# Patient Record
Sex: Female | Born: 1940
Health system: Southern US, Community
[De-identification: ages and names within clinical notes are randomized; demographics above are authoritative.]

## PROBLEM LIST (undated history)

## (undated) DIAGNOSIS — R0600 Dyspnea, unspecified: Secondary | ICD-10-CM

## (undated) DIAGNOSIS — I499 Cardiac arrhythmia, unspecified: Secondary | ICD-10-CM

## (undated) DIAGNOSIS — I10 Essential (primary) hypertension: Secondary | ICD-10-CM

## (undated) DIAGNOSIS — Z9981 Dependence on supplemental oxygen: Secondary | ICD-10-CM

## (undated) DIAGNOSIS — I4891 Unspecified atrial fibrillation: Secondary | ICD-10-CM

## (undated) DIAGNOSIS — F32A Depression, unspecified: Secondary | ICD-10-CM

## (undated) DIAGNOSIS — F329 Major depressive disorder, single episode, unspecified: Secondary | ICD-10-CM

## (undated) DIAGNOSIS — D332 Benign neoplasm of brain, unspecified: Secondary | ICD-10-CM

## (undated) HISTORY — PX: BRAIN SURGERY: SHX531

## (undated) HISTORY — DX: Unspecified atrial fibrillation: I48.91

## (undated) HISTORY — PX: BLADDER SURGERY: SHX569

## (undated) HISTORY — PX: ABDOMINAL HYSTERECTOMY: SHX81

## (undated) HISTORY — DX: Cardiac arrhythmia, unspecified: I49.9

## (undated) SURGERY — VIDEO BRONCHOSCOPY WITHOUT FLUORO
Anesthesia: General

---

## 2015-06-23 DIAGNOSIS — I10 Essential (primary) hypertension: Secondary | ICD-10-CM | POA: Diagnosis not present

## 2015-06-23 DIAGNOSIS — J3089 Other allergic rhinitis: Secondary | ICD-10-CM | POA: Diagnosis not present

## 2015-06-23 DIAGNOSIS — D329 Benign neoplasm of meninges, unspecified: Secondary | ICD-10-CM | POA: Diagnosis not present

## 2015-06-23 DIAGNOSIS — F3340 Major depressive disorder, recurrent, in remission, unspecified: Secondary | ICD-10-CM | POA: Diagnosis not present

## 2015-06-23 DIAGNOSIS — M150 Primary generalized (osteo)arthritis: Secondary | ICD-10-CM | POA: Diagnosis not present

## 2015-06-24 DIAGNOSIS — E785 Hyperlipidemia, unspecified: Secondary | ICD-10-CM | POA: Diagnosis not present

## 2015-09-10 DIAGNOSIS — J40 Bronchitis, not specified as acute or chronic: Secondary | ICD-10-CM | POA: Diagnosis not present

## 2015-09-10 DIAGNOSIS — R509 Fever, unspecified: Secondary | ICD-10-CM | POA: Diagnosis not present

## 2015-09-10 DIAGNOSIS — I771 Stricture of artery: Secondary | ICD-10-CM | POA: Diagnosis not present

## 2015-09-10 DIAGNOSIS — R05 Cough: Secondary | ICD-10-CM | POA: Diagnosis not present

## 2015-11-05 DIAGNOSIS — J0100 Acute maxillary sinusitis, unspecified: Secondary | ICD-10-CM | POA: Diagnosis not present

## 2015-12-08 DIAGNOSIS — Z85820 Personal history of malignant melanoma of skin: Secondary | ICD-10-CM | POA: Diagnosis not present

## 2015-12-08 DIAGNOSIS — D249 Benign neoplasm of unspecified breast: Secondary | ICD-10-CM | POA: Diagnosis not present

## 2015-12-08 DIAGNOSIS — Z08 Encounter for follow-up examination after completed treatment for malignant neoplasm: Secondary | ICD-10-CM | POA: Diagnosis not present

## 2015-12-22 DIAGNOSIS — D329 Benign neoplasm of meninges, unspecified: Secondary | ICD-10-CM | POA: Diagnosis not present

## 2015-12-22 DIAGNOSIS — I10 Essential (primary) hypertension: Secondary | ICD-10-CM | POA: Diagnosis not present

## 2015-12-22 DIAGNOSIS — N951 Menopausal and female climacteric states: Secondary | ICD-10-CM | POA: Diagnosis not present

## 2015-12-22 DIAGNOSIS — F4321 Adjustment disorder with depressed mood: Secondary | ICD-10-CM | POA: Diagnosis not present

## 2016-05-19 DIAGNOSIS — L309 Dermatitis, unspecified: Secondary | ICD-10-CM | POA: Diagnosis not present

## 2016-06-15 DIAGNOSIS — E785 Hyperlipidemia, unspecified: Secondary | ICD-10-CM | POA: Diagnosis not present

## 2016-06-15 DIAGNOSIS — E039 Hypothyroidism, unspecified: Secondary | ICD-10-CM | POA: Diagnosis not present

## 2016-06-21 DIAGNOSIS — M150 Primary generalized (osteo)arthritis: Secondary | ICD-10-CM | POA: Diagnosis not present

## 2016-06-21 DIAGNOSIS — Z780 Asymptomatic menopausal state: Secondary | ICD-10-CM | POA: Diagnosis not present

## 2016-06-21 DIAGNOSIS — I10 Essential (primary) hypertension: Secondary | ICD-10-CM | POA: Diagnosis not present

## 2016-06-21 DIAGNOSIS — F4321 Adjustment disorder with depressed mood: Secondary | ICD-10-CM | POA: Diagnosis not present

## 2016-06-22 DIAGNOSIS — R7301 Impaired fasting glucose: Secondary | ICD-10-CM | POA: Diagnosis not present

## 2016-08-16 DIAGNOSIS — I10 Essential (primary) hypertension: Secondary | ICD-10-CM | POA: Diagnosis not present

## 2016-08-16 DIAGNOSIS — F4321 Adjustment disorder with depressed mood: Secondary | ICD-10-CM | POA: Diagnosis not present

## 2016-12-26 DIAGNOSIS — Z23 Encounter for immunization: Secondary | ICD-10-CM | POA: Diagnosis not present

## 2017-01-03 ENCOUNTER — Emergency Department (HOSPITAL_BASED_OUTPATIENT_CLINIC_OR_DEPARTMENT_OTHER)
Admission: EM | Admit: 2017-01-03 | Discharge: 2017-01-03 | Disposition: A | Discharge status: Home / Self Care | Payer: Medicare Other | Attending: Emergency Medicine | Admitting: Emergency Medicine

## 2017-01-03 ENCOUNTER — Emergency Department (HOSPITAL_BASED_OUTPATIENT_CLINIC_OR_DEPARTMENT_OTHER): Payer: Medicare Other

## 2017-01-03 ENCOUNTER — Encounter (HOSPITAL_BASED_OUTPATIENT_CLINIC_OR_DEPARTMENT_OTHER): Payer: Self-pay

## 2017-01-03 DIAGNOSIS — Z79899 Other long term (current) drug therapy: Secondary | ICD-10-CM | POA: Insufficient documentation

## 2017-01-03 DIAGNOSIS — J209 Acute bronchitis, unspecified: Secondary | ICD-10-CM | POA: Diagnosis not present

## 2017-01-03 DIAGNOSIS — Z87891 Personal history of nicotine dependence: Secondary | ICD-10-CM | POA: Insufficient documentation

## 2017-01-03 DIAGNOSIS — R05 Cough: Secondary | ICD-10-CM

## 2017-01-03 DIAGNOSIS — J40 Bronchitis, not specified as acute or chronic: Secondary | ICD-10-CM

## 2017-01-03 DIAGNOSIS — I10 Essential (primary) hypertension: Secondary | ICD-10-CM | POA: Insufficient documentation

## 2017-01-03 DIAGNOSIS — R059 Cough, unspecified: Secondary | ICD-10-CM

## 2017-01-03 DIAGNOSIS — R0602 Shortness of breath: Secondary | ICD-10-CM | POA: Diagnosis present

## 2017-01-03 HISTORY — DX: Essential (primary) hypertension: I10

## 2017-01-03 HISTORY — DX: Major depressive disorder, single episode, unspecified: F32.9

## 2017-01-03 HISTORY — DX: Depression, unspecified: F32.A

## 2017-01-03 HISTORY — DX: Benign neoplasm of brain, unspecified: D33.2

## 2017-01-03 MED ORDER — AZITHROMYCIN 500 MG IV SOLR
INTRAVENOUS | Status: AC
  Filled 2017-01-03: qty 500

## 2017-01-03 MED ORDER — AZITHROMYCIN 500 MG IV SOLR
500.0000 mg | Freq: Once | INTRAVENOUS | Status: AC
  Administered 2017-01-03: 500 mg via INTRAVENOUS
  Filled 2017-01-03: qty 500

## 2017-01-03 MED ORDER — AZITHROMYCIN 250 MG PO TABS: 250.0000 mg | ORAL_TABLET | Freq: Every day | ORAL | 0 refills | Status: AC

## 2017-01-03 MED ORDER — ALBUTEROL SULFATE HFA 108 (90 BASE) MCG/ACT IN AERS
2.0000 | INHALATION_SPRAY | Freq: Once | RESPIRATORY_TRACT | Status: AC
  Administered 2017-01-03: 2 via RESPIRATORY_TRACT
  Filled 2017-01-03: qty 6.7

## 2017-01-03 MED FILL — AZITHROMYCIN 250 MG TABLET: 250 | 4 days supply | Qty: 4 | Fill #0

## 2017-01-03 NOTE — ED Notes (Signed)
Patient transported to X-ray 

## 2017-01-03 NOTE — ED Notes (Signed)
Pt verbalized understanding of discharge instructions and denies any further questions at this time.   

## 2017-01-03 NOTE — ED Provider Notes (Signed)
[name] EMERGENCY DEPARTMENT Provider Note  CSN: [id number] Arrival date & time: 01/03/17 1156  Chief Complaint(s) Shortness of Breath  HPI Cassidy Sanders is a 76 y.o. female   The history is provided by the patient.  Shortness of Breath  This is a new problem. The average episode lasts 3 days. The problem occurs continuously.Progression since onset: fluctuating. Associated symptoms include rhinorrhea, cough, sputum production and wheezing. Pertinent negatives include no fever, no chest pain, no abdominal pain and no leg swelling. Associated symptoms comments: DOE. Associated medical issues do not include asthma, pneumonia, chronic lung disease, PE, heart failure or DVT.   Husband had pneumonia 2 weeks ago.  Past Medical History Past Medical History:  Diagnosis Date  . Brain tumor (benign) ([place])   . Depression   . Hypertension    There are no active problems to display for this patient.  Home Medication(s) Prior to Admission medications   Medication Sig Start Date End Date Taking? Authorizing Provider  Sertraline HCl (ZOLOFT PO) Take by mouth.   Yes [provider]  UNKNOWN TO PATIENT BP med   Yes [provider]  azithromycin (ZITHROMAX) 250 MG tablet Take 1 tablet (250 mg total) by mouth daily. Take first 2 tablets together, then 1 every day until finished. 01/04/17 01/08/17  Fatima Blank, MD                                                                                                                                    Past Surgical History Past Surgical History:  Procedure Laterality Date  . ABDOMINAL HYSTERECTOMY    . BLADDER SURGERY    . BRAIN SURGERY     Family History No family history on file.  Social History Social History  Substance Use Topics  . Smoking status: Former Research scientist (life sciences)  . Smokeless tobacco: Never Used  . Alcohol use Yes     Comment: weekly   Allergies Gabapentin  Review of Systems Review of Systems    Constitutional: Negative for fever.  HENT: Positive for rhinorrhea.   Respiratory: Positive for cough, sputum production, shortness of breath and wheezing.   Cardiovascular: Negative for chest pain and leg swelling.  Gastrointestinal: Negative for abdominal pain.   All other systems are reviewed and are negative for acute change except as noted in the HPI  Physical Exam Vital Signs  I have reviewed the triage vital signs BP (!) 146/87 (BP Location: Left Arm)   Pulse 78   Temp 98.2 F (36.8 C) (Oral)   Resp (!) 24   Wt 90 kg (198 lb 6.6 oz)   SpO2 96%   Physical Exam  Constitutional: She is oriented to person, place, and time. She appears well-developed and well-nourished. No distress.  HENT:  Head: Normocephalic and atraumatic.  Right Ear: Tympanic membrane normal.  Left Ear: Tympanic membrane normal.  Nose: Mucosal edema and rhinorrhea present.  Mouth/Throat: Mucous  membranes are normal. No posterior oropharyngeal edema or posterior oropharyngeal erythema. No tonsillar exudate.  PND with cobblestoning   Eyes: Pupils are equal, round, and reactive to light. Conjunctivae and EOM are normal. Right eye exhibits no discharge. Left eye exhibits no discharge. No scleral icterus.  Neck: Normal range of motion. Neck supple.  Cardiovascular: Normal rate and regular rhythm.  Exam reveals no gallop and no friction rub.   No murmur heard. Pulmonary/Chest: Effort normal. No stridor. No tachypnea. No respiratory distress. She has wheezes (mild inspiratory). She has no rhonchi. She has no rales.  Abdominal: Soft. She exhibits no distension. There is no tenderness.  Musculoskeletal: She exhibits no edema or tenderness.  Neurological: She is alert and oriented to person, place, and time.  Skin: Skin is warm and dry. No rash noted. She is not diaphoretic. No erythema.  Psychiatric: She has a normal mood and affect.  Vitals reviewed.   ED Results and Treatments Labs (all labs ordered are  listed, but only abnormal results are displayed) Labs Reviewed - No data to display                                                                                                                       EKG  EKG Interpretation  Date/Time:    Ventricular Rate:    PR Interval:    QRS Duration:   QT Interval:    QTC Calculation:   R Axis:     Text Interpretation:        Radiology Dg Chest 2 View  Result Date: 01/03/2017 CLINICAL DATA:  Cough since Monday, wheezing. EXAM: CHEST  2 VIEW COMPARISON:  None. FINDINGS: Abnormal soft tissue is noted in the right mediastinum/peritracheal region or right suprahilar upper lobe. Cannot exclude suprahilar or mediastinal mass. No confluent opacity on the left. Heart is upper limits normal in size. No effusions or acute bony abnormality. IMPRESSION: Abnormal right suprahilar and/or right paratracheal soft tissue. Cannot exclude mass. Recommend further evaluation with chest CT with IV contrast. Electronically Signed   By: Rolm Baptise M.D.   On: 01/03/2017 13:10   Pertinent labs & imaging results that were available during my care of the patient were reviewed by me and considered in my medical decision making (see chart for details).  Medications Ordered in ED Medications  azithromycin (ZITHROMAX) 500 MG injection (not administered)  albuterol (PROVENTIL HFA;VENTOLIN HFA) 108 (90 Base) MCG/ACT inhaler 2 puff (2 puffs Inhalation Given 01/03/17 1239)  azithromycin (ZITHROMAX) 500 mg in dextrose 5 % 250 mL IVPB (0 mg Intravenous Stopped 01/03/17 1405)  Procedures Procedures  (including critical care time)  Medical Decision Making / ED Course I have reviewed the nursing notes for this encounter and the patient's prior records (if available in EHR or on provided paperwork).    76 y.o. female presents with URI/Bronchitis  symptoms for 3 days. adequate oral hydration. Rest of history as above.  Patient appears well. No signs of toxicity, patient is interactive. No hypoxia, tachypnea or other signs of respiratory distress. No sign of clinical dehydration. Lung exam clear. Rest of exam as above.  CXR w/o evidence of PNA. Did note mediastinal tissue thickening of which she was made aware in order for her to follow up with PCP. No pulmonary edema or evidence of volume overload concerning for HF.  SOB and wheezing improved with albuterol puffs.  Most consistent with viral process, but given her exposure to PNA, will treat with Azithro.   No evidence suggestive of pharyngitis, AOM.  Discussed symptomatic treatment with the patient and they will follow closely with their PCP.    Final Clinical Impression(s) / ED Diagnoses Final diagnoses:  Cough  Bronchitis   Disposition: Discharge  Condition: Good  I have discussed the results, Dx and Tx plan with the patient who expressed understanding and agree(s) with the plan. Discharge instructions discussed at great length. The patient was given strict return precautions who verbalized understanding of the instructions. No further questions at time of discharge.    New Prescriptions   AZITHROMYCIN (ZITHROMAX) 250 MG TABLET    Take 1 tablet (250 mg total) by mouth daily. Take first 2 tablets together, then 1 every day until finished.    Follow Up: Primary care provider   in 5-7 days, If symptoms do not improve or  worsen. Also to follow up for the incidental chest x-ray findings.      This chart was dictated using voice recognition software.  Despite best efforts to proofread,  errors can occur which can change the documentation meaning.   Fatima Blank, MD 01/03/17 [phone]

## 2017-01-03 NOTE — Discharge Instructions (Signed)
During the workup we noted incidental findings on your imaging that would require you to follow-up with your regular doctor for further evaluation/management: 1.  Abnormal right suprahilar and/or right paratracheal soft tissue.  Cannot exclude mass. Recommend further evaluation with chest CT with  IV contrast

## 2017-01-03 NOTE — ED Notes (Signed)
RT at bedside.

## 2017-01-03 NOTE — ED Triage Notes (Addendum)
C/o SOB, prod cough-states she feels like she is weezing x 3 days-NAD-steady gait

## 2017-01-17 DIAGNOSIS — J208 Acute bronchitis due to other specified organisms: Secondary | ICD-10-CM | POA: Diagnosis not present

## 2017-01-17 DIAGNOSIS — R918 Other nonspecific abnormal finding of lung field: Secondary | ICD-10-CM | POA: Diagnosis not present

## 2017-01-24 DIAGNOSIS — I10 Essential (primary) hypertension: Secondary | ICD-10-CM | POA: Diagnosis not present

## 2017-01-24 DIAGNOSIS — R918 Other nonspecific abnormal finding of lung field: Secondary | ICD-10-CM | POA: Diagnosis not present

## 2017-01-24 DIAGNOSIS — J208 Acute bronchitis due to other specified organisms: Secondary | ICD-10-CM | POA: Diagnosis not present

## 2017-01-31 ENCOUNTER — Emergency Department (HOSPITAL_BASED_OUTPATIENT_CLINIC_OR_DEPARTMENT_OTHER): Payer: Medicare Other

## 2017-01-31 ENCOUNTER — Encounter (HOSPITAL_BASED_OUTPATIENT_CLINIC_OR_DEPARTMENT_OTHER): Payer: Self-pay

## 2017-01-31 ENCOUNTER — Other Ambulatory Visit: Payer: Self-pay

## 2017-01-31 ENCOUNTER — Inpatient Hospital Stay (HOSPITAL_BASED_OUTPATIENT_CLINIC_OR_DEPARTMENT_OTHER)
Admission: EM | Admit: 2017-01-31 | Discharge: 2017-02-12 | DRG: 208 | Disposition: A | Discharge status: Home / Self Care | Payer: Medicare Other | Attending: Internal Medicine | Admitting: Internal Medicine

## 2017-01-31 DIAGNOSIS — Z888 Allergy status to other drugs, medicaments and biological substances status: Secondary | ICD-10-CM | POA: Diagnosis not present

## 2017-01-31 DIAGNOSIS — J9601 Acute respiratory failure with hypoxia: Secondary | ICD-10-CM | POA: Diagnosis not present

## 2017-01-31 DIAGNOSIS — I9589 Other hypotension: Secondary | ICD-10-CM | POA: Diagnosis present

## 2017-01-31 DIAGNOSIS — K219 Gastro-esophageal reflux disease without esophagitis: Secondary | ICD-10-CM | POA: Diagnosis present

## 2017-01-31 DIAGNOSIS — R739 Hyperglycemia, unspecified: Secondary | ICD-10-CM | POA: Diagnosis not present

## 2017-01-31 DIAGNOSIS — T380X5A Adverse effect of glucocorticoids and synthetic analogues, initial encounter: Secondary | ICD-10-CM | POA: Diagnosis not present

## 2017-01-31 DIAGNOSIS — R079 Chest pain, unspecified: Secondary | ICD-10-CM | POA: Diagnosis not present

## 2017-01-31 DIAGNOSIS — G4700 Insomnia, unspecified: Secondary | ICD-10-CM | POA: Diagnosis present

## 2017-01-31 DIAGNOSIS — Z91048 Other nonmedicinal substance allergy status: Secondary | ICD-10-CM

## 2017-01-31 DIAGNOSIS — I959 Hypotension, unspecified: Secondary | ICD-10-CM | POA: Diagnosis not present

## 2017-01-31 DIAGNOSIS — J9600 Acute respiratory failure, unspecified whether with hypoxia or hypercapnia: Secondary | ICD-10-CM

## 2017-01-31 DIAGNOSIS — R062 Wheezing: Secondary | ICD-10-CM | POA: Diagnosis not present

## 2017-01-31 DIAGNOSIS — I10 Essential (primary) hypertension: Secondary | ICD-10-CM | POA: Diagnosis present

## 2017-01-31 DIAGNOSIS — R1010 Upper abdominal pain, unspecified: Secondary | ICD-10-CM

## 2017-01-31 DIAGNOSIS — C3401 Malignant neoplasm of right main bronchus: Secondary | ICD-10-CM | POA: Diagnosis not present

## 2017-01-31 DIAGNOSIS — J9690 Respiratory failure, unspecified, unspecified whether with hypoxia or hypercapnia: Secondary | ICD-10-CM | POA: Diagnosis not present

## 2017-01-31 DIAGNOSIS — E46 Unspecified protein-calorie malnutrition: Secondary | ICD-10-CM | POA: Diagnosis present

## 2017-01-31 DIAGNOSIS — F329 Major depressive disorder, single episode, unspecified: Secondary | ICD-10-CM | POA: Diagnosis present

## 2017-01-31 DIAGNOSIS — Z8052 Family history of malignant neoplasm of bladder: Secondary | ICD-10-CM | POA: Diagnosis not present

## 2017-01-31 DIAGNOSIS — I871 Compression of vein: Secondary | ICD-10-CM | POA: Diagnosis present

## 2017-01-31 DIAGNOSIS — Z87891 Personal history of nicotine dependence: Secondary | ICD-10-CM | POA: Diagnosis not present

## 2017-01-31 DIAGNOSIS — J9859 Other diseases of mediastinum, not elsewhere classified: Secondary | ICD-10-CM

## 2017-01-31 DIAGNOSIS — R634 Abnormal weight loss: Secondary | ICD-10-CM | POA: Diagnosis not present

## 2017-01-31 DIAGNOSIS — R222 Localized swelling, mass and lump, trunk: Secondary | ICD-10-CM | POA: Diagnosis present

## 2017-01-31 DIAGNOSIS — R0602 Shortness of breath: Secondary | ICD-10-CM | POA: Diagnosis not present

## 2017-01-31 DIAGNOSIS — J9809 Other diseases of bronchus, not elsewhere classified: Secondary | ICD-10-CM | POA: Diagnosis present

## 2017-01-31 DIAGNOSIS — R5381 Other malaise: Secondary | ICD-10-CM

## 2017-01-31 DIAGNOSIS — R05 Cough: Secondary | ICD-10-CM | POA: Diagnosis not present

## 2017-01-31 DIAGNOSIS — J449 Chronic obstructive pulmonary disease, unspecified: Secondary | ICD-10-CM | POA: Diagnosis present

## 2017-01-31 DIAGNOSIS — Z91040 Latex allergy status: Secondary | ICD-10-CM | POA: Diagnosis not present

## 2017-01-31 DIAGNOSIS — J90 Pleural effusion, not elsewhere classified: Secondary | ICD-10-CM | POA: Diagnosis not present

## 2017-01-31 DIAGNOSIS — Z6827 Body mass index (BMI) 27.0-27.9, adult: Secondary | ICD-10-CM

## 2017-01-31 DIAGNOSIS — Z4659 Encounter for fitting and adjustment of other gastrointestinal appliance and device: Secondary | ICD-10-CM

## 2017-01-31 DIAGNOSIS — R109 Unspecified abdominal pain: Secondary | ICD-10-CM | POA: Diagnosis not present

## 2017-01-31 DIAGNOSIS — R042 Hemoptysis: Secondary | ICD-10-CM | POA: Diagnosis present

## 2017-01-31 DIAGNOSIS — C3490 Malignant neoplasm of unspecified part of unspecified bronchus or lung: Principal | ICD-10-CM | POA: Diagnosis present

## 2017-01-31 DIAGNOSIS — Z79899 Other long term (current) drug therapy: Secondary | ICD-10-CM | POA: Diagnosis not present

## 2017-01-31 DIAGNOSIS — F419 Anxiety disorder, unspecified: Secondary | ICD-10-CM | POA: Diagnosis present

## 2017-01-31 DIAGNOSIS — Z4682 Encounter for fitting and adjustment of non-vascular catheter: Secondary | ICD-10-CM | POA: Diagnosis not present

## 2017-01-31 DIAGNOSIS — C382 Malignant neoplasm of posterior mediastinum: Secondary | ICD-10-CM | POA: Diagnosis not present

## 2017-01-31 DIAGNOSIS — R918 Other nonspecific abnormal finding of lung field: Secondary | ICD-10-CM | POA: Diagnosis present

## 2017-01-31 LAB — CBC WITH DIFFERENTIAL/PLATELET
Basophils Absolute: 0 10*3/uL (ref 0.0–0.1)
Basophils Relative: 0 %
Eosinophils Absolute: 0.1 10*3/uL (ref 0.0–0.7)
Eosinophils Relative: 1 %
HEMATOCRIT: 42.1 % (ref 36.0–46.0)
HEMOGLOBIN: 13.5 g/dL (ref 12.0–15.0)
LYMPHS ABS: 2.5 10*3/uL (ref 0.7–4.0)
LYMPHS PCT: 22 %
MCH: 28.6 pg (ref 26.0–34.0)
MCHC: 32.1 g/dL (ref 30.0–36.0)
MCV: 89.2 fL (ref 78.0–100.0)
MONOS PCT: 11 %
Monocytes Absolute: 1.2 10*3/uL — ABNORMAL HIGH (ref 0.1–1.0)
NEUTROS ABS: 7.5 10*3/uL (ref 1.7–7.7)
NEUTROS PCT: 66 %
Platelets: 173 10*3/uL (ref 150–400)
RBC: 4.72 MIL/uL (ref 3.87–5.11)
RDW: 15.1 % (ref 11.5–15.5)
WBC: 11.3 10*3/uL — ABNORMAL HIGH (ref 4.0–10.5)

## 2017-01-31 LAB — COMPREHENSIVE METABOLIC PANEL
ALK PHOS: 76 U/L (ref 38–126)
ALT: 17 U/L (ref 14–54)
ANION GAP: 7 (ref 5–15)
AST: 18 U/L (ref 15–41)
Albumin: 3.6 g/dL (ref 3.5–5.0)
BILIRUBIN TOTAL: 1.1 mg/dL (ref 0.3–1.2)
BUN: 25 mg/dL — ABNORMAL HIGH (ref 6–20)
CALCIUM: 9.2 mg/dL (ref 8.9–10.3)
CO2: 28 mmol/L (ref 22–32)
Chloride: 102 mmol/L (ref 101–111)
Creatinine, Ser: 0.74 mg/dL (ref 0.44–1.00)
GFR calc non Af Amer: >60 mL/min (ref >60)
Glucose, Bld: 74 mg/dL (ref 65–99)
Potassium: 3.5 mmol/L (ref 3.5–5.1)
Sodium: 137 mmol/L (ref 135–145)
TOTAL PROTEIN: 6.8 g/dL (ref 6.5–8.1)

## 2017-01-31 LAB — TROPONIN I: Troponin I: <0.03 ng/mL (ref <0.03)

## 2017-01-31 MED ORDER — IPRATROPIUM-ALBUTEROL 0.5-2.5 (3) MG/3ML IN SOLN
3.0000 mL | Freq: Once | RESPIRATORY_TRACT | Status: AC
  Administered 2017-01-31: 3 mL via RESPIRATORY_TRACT
  Filled 2017-01-31: qty 3

## 2017-01-31 MED ORDER — VITAMIN E 180 MG (400 UNIT) PO CAPS
400.0000 [IU] | ORAL_CAPSULE | Freq: Every day | ORAL | Status: DC
  Administered 2017-02-01 – 2017-02-12 (×10): 400 [IU] via ORAL
  Filled 2017-01-31 (×13): qty 1

## 2017-01-31 MED ORDER — LISINOPRIL 20 MG PO TABS: 20.0000 mg | ORAL_TABLET | Freq: Every day | ORAL | Status: DC

## 2017-01-31 MED ORDER — SODIUM CHLORIDE 0.9 % IV BOLUS (SEPSIS)
1000.0000 mL | Freq: Once | INTRAVENOUS | Status: AC
  Administered 2017-01-31: 1000 mL via INTRAVENOUS

## 2017-01-31 MED ORDER — ACETAMINOPHEN 325 MG PO TABS: 650.0000 mg | ORAL_TABLET | Freq: Four times a day (QID) | ORAL | Status: DC | PRN

## 2017-01-31 MED ORDER — IOPAMIDOL (ISOVUE-370) INJECTION 76%
100.0000 mL | Freq: Once | INTRAVENOUS | Status: AC | PRN
  Administered 2017-01-31: 100 mL via INTRAVENOUS

## 2017-01-31 MED ORDER — IPRATROPIUM-ALBUTEROL 0.5-2.5 (3) MG/3ML IN SOLN
3.0000 mL | Freq: Three times a day (TID) | RESPIRATORY_TRACT | Status: DC
  Administered 2017-02-01 – 2017-02-12 (×32): 3 mL via RESPIRATORY_TRACT
  Filled 2017-01-31 (×34): qty 3

## 2017-01-31 MED ORDER — ONDANSETRON HCL 4 MG PO TABS: 4.0000 mg | ORAL_TABLET | Freq: Four times a day (QID) | ORAL | Status: DC | PRN

## 2017-01-31 MED ORDER — HYDROCHLOROTHIAZIDE 25 MG PO TABS: 25.0000 mg | ORAL_TABLET | Freq: Every day | ORAL | Status: DC

## 2017-01-31 MED ORDER — ACETAMINOPHEN 650 MG RE SUPP: 650.0000 mg | Freq: Four times a day (QID) | RECTAL | Status: DC | PRN

## 2017-01-31 MED ORDER — ENOXAPARIN SODIUM 40 MG/0.4ML ~~LOC~~ SOLN
40.0000 mg | SUBCUTANEOUS | Status: DC
  Administered 2017-01-31: 40 mg via SUBCUTANEOUS
  Filled 2017-01-31: qty 0.4

## 2017-01-31 MED ORDER — ONDANSETRON HCL 4 MG/2ML IJ SOLN
4.0000 mg | Freq: Four times a day (QID) | INTRAMUSCULAR | Status: DC | PRN
  Administered 2017-02-06: 4 mg via INTRAVENOUS
  Filled 2017-01-31: qty 2

## 2017-01-31 MED ORDER — RACEPINEPHRINE HCL 2.25 % IN NEBU
0.5000 mL | INHALATION_SOLUTION | Freq: Once | RESPIRATORY_TRACT | Status: AC
  Administered 2017-01-31: 0.5 mL via RESPIRATORY_TRACT
  Filled 2017-01-31: qty 0.5

## 2017-01-31 MED ORDER — METOPROLOL TARTRATE 50 MG PO TABS: 50.0000 mg | ORAL_TABLET | Freq: Every day | ORAL | Status: DC

## 2017-01-31 MED ORDER — SODIUM CHLORIDE 0.9 % IV SOLN
INTRAVENOUS | Status: DC
  Administered 2017-01-31 – 2017-02-02 (×2): via INTRAVENOUS

## 2017-01-31 MED ORDER — ALBUTEROL SULFATE (2.5 MG/3ML) 0.083% IN NEBU
2.5000 mg | INHALATION_SOLUTION | Freq: Four times a day (QID) | RESPIRATORY_TRACT | Status: DC
  Administered 2017-01-31: 2.5 mg via RESPIRATORY_TRACT
  Filled 2017-01-31: qty 3

## 2017-01-31 MED ORDER — METHYLPREDNISOLONE SODIUM SUCC 125 MG IJ SOLR
125.0000 mg | Freq: Once | INTRAMUSCULAR | Status: AC
  Administered 2017-01-31: 125 mg via INTRAVENOUS
  Filled 2017-01-31: qty 2

## 2017-01-31 MED ORDER — ALBUTEROL SULFATE (2.5 MG/3ML) 0.083% IN NEBU
2.5000 mg | INHALATION_SOLUTION | RESPIRATORY_TRACT | Status: DC | PRN
  Administered 2017-02-05 – 2017-02-09 (×2): 2.5 mg via RESPIRATORY_TRACT
  Filled 2017-01-31 (×3): qty 3

## 2017-01-31 MED ORDER — SERTRALINE HCL 50 MG PO TABS
50.0000 mg | ORAL_TABLET | Freq: Every day | ORAL | Status: DC
Start: 2017-02-01 — End: 2017-02-12
  Administered 2017-02-01 – 2017-02-12 (×12): 50 mg via ORAL
  Filled 2017-01-31 (×12): qty 1

## 2017-01-31 MED ORDER — IPRATROPIUM BROMIDE 0.02 % IN SOLN
0.5000 mg | Freq: Four times a day (QID) | RESPIRATORY_TRACT | Status: DC
  Administered 2017-01-31: 0.5 mg via RESPIRATORY_TRACT
  Filled 2017-01-31: qty 2.5

## 2017-01-31 MED ORDER — LISINOPRIL-HYDROCHLOROTHIAZIDE 20-25 MG PO TABS: 1.0000 | ORAL_TABLET | Freq: Every day | ORAL | Status: DC

## 2017-01-31 NOTE — ED Triage Notes (Signed)
C/o SOB x 1 month-states she has been seen here and by MD at living facility-abx and steroids with no improvement

## 2017-01-31 NOTE — Progress Notes (Signed)
BP 130/70 (BP Location: Left Arm)   Pulse 79   Temp 97.7 F (36.5 C) (Oral)   Resp 17   Ht 5\' 9"  (1.753 m)   Wt 88.5 kg (195 lb)   SpO2 93%   BMI 28.80 kg/m   76 y/o with 76 y.o. female with history of depression, benign brain tumor, tobacco abuse and hypertension who presents today with chief complaint acute onset, intermittent shortness of breath for 1 month.  She was seen and evaluated on January 03, 2017 and diagnosed with acute bronchitis treated with abx and steroids. Went and saw he PCP who prescribe her another course of abx ans steroids with no improvement. 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  The ED spoke with the oncologist on call Dr. Lebron Conners, who recommended biopsy, for which the ED has already contact IR and transfer and admission to [facility].

## 2017-01-31 NOTE — Progress Notes (Signed)
Request received for image guided paratracheal/mediastinal mass bx on pt. Imaging studies were reviewed by Dr. Pascal Lux and he recommends pulm consult/bronchoscopic guided biopsy instead. Case d/w Dr. Lebron Conners.

## 2017-01-31 NOTE — ED Provider Notes (Signed)
[facility] EMERGENCY DEPARTMENT Provider Note   CSN: [id number] Arrival date & time: 01/31/17  1149     History   Chief Complaint Chief Complaint  Patient presents with  . Shortness of Breath    HPI Cassidy Sanders is a 76 y.o. female with history of depression, benign brain tumor, and hypertension who presents today with chief complaint acute onset, intermittent shortness of breath for 1 month.  She was seen and evaluated on January 03, 2017 and diagnosed with acute bronchitis, discharged with azithromycin and albuterol inhaler. Chest x-ray at that time could not exclude mass and CT scan with IV contrast was recommended.  She has followed up with primary care twice with persisting symptoms, treated with a course of Levaquin and Decadron as well as albuterol inhaler.  She states that none of these have been helpful.  Shortness of breath is intermittent, worsens with activity.  She denies chest pain, cough, hemoptysis, fevers, or chills.  She endorses mild nasal congestion.  She does endorse persistent wheezing.  She does endorse a 10-15 pounds of weight loss over the course of 1 month.  Denies night sweats.  She is a former smoker, smoked for several years and quit approximately 4 years ago.  Denies recent travel or surgeries, no prior history of DVT or PE, no estrogen use.  The history is provided by the patient.    Past Medical History:  Diagnosis Date  . Brain tumor (benign) ([place])   . Depression   . Hypertension     Patient Active Problem List   Diagnosis Date Noted  . 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 ABDOMINAL HYSTERECTOMY    . BLADDER SURGERY    . BRAIN SURGERY      OB History    No data available       Home Medications    Prior to Admission medications   Medication Sig Start Date End Date Taking? Authorizing Provider  Sertraline HCl (ZOLOFT PO) Take by mouth.    [provider]  UNKNOWN TO  PATIENT BP med    [provider]    Family History No family history on file.  Social History Social History   Tobacco Use  . Smoking status: Former Research scientist (life sciences)  . Smokeless tobacco: Never Used  Substance Use Topics  . Alcohol use: Yes    Comment: weekly  . Drug use: No     Allergies   Gabapentin   Review of Systems Review of Systems  Constitutional: Negative for chills and fever.  HENT: Positive for congestion.   Respiratory: Positive for shortness of breath and wheezing. Negative for chest tightness.   Cardiovascular: Negative for chest pain and leg swelling.  Gastrointestinal: Negative for abdominal pain, nausea and vomiting.  All other systems reviewed and are negative.    Physical Exam Updated Vital Signs BP 125/70 (BP Location: Left Arm)   Pulse 89   Temp 97.8 F (36.6 C) (Oral)   Resp 17   Ht 5\' 9"  (1.753 m)   Wt 88.5 kg (195 lb)   SpO2 92%   BMI 28.80 kg/m   Physical Exam  Constitutional: She appears well-developed and well-nourished. No distress.  HENT:  Head: Normocephalic and atraumatic.  Mouth/Throat: Oropharynx is clear and moist.  Mild nasal congestion noted bilaterally.  No frontal or maxillary sinus TTP.  TMs normal bilaterally.  Posterior oropharynx unremarkable.  No trismus or uvular deviation or sublingual abnormal  Eyes: Conjunctivae are normal. Right eye exhibits no discharge. Left eye exhibits no discharge.  Neck: Neck supple. No JVD present. No tracheal deviation present.  Cardiovascular: Normal rate, regular rhythm, normal heart sounds and intact distal pulses.  Pulmonary/Chest: Effort normal. Stridor present. Tachypnea noted. She exhibits no tenderness, no laceration, no crepitus and no retraction.  Slightly tachypneic, speaking in full sentences. Diffuse scattered inspiratory and expiratory wheezes and rhonchi, worse in the anterior lung fields.  Stridorous upper airway on auscultation of the neck.  Abdominal: Soft. She exhibits  no distension.  Musculoskeletal: She exhibits no edema.       Right lower leg: Normal.       Left lower leg: Normal.  Neurological: She is alert.  Skin: Skin is warm and dry. No erythema.  Psychiatric: She has a normal mood and affect. Her behavior is normal.  Nursing note and vitals reviewed.    ED Treatments / Results  Labs (all labs ordered are listed, but only abnormal results are displayed) Labs Reviewed  CBC WITH DIFFERENTIAL/PLATELET - Abnormal; Notable for the following components:      Result Value   WBC 11.3 (*)    Monocytes Absolute 1.2 (*)    All other components within normal limits  COMPREHENSIVE METABOLIC PANEL - Abnormal; Notable for the following components:   BUN 25 (*)    All other components within normal limits  TROPONIN I    EKG  EKG Interpretation  Date/Time:  Wednesday January 31 2017 13:30:27 EST Ventricular Rate:  76 PR Interval:    QRS Duration: 88 QT Interval:  380 QTC Calculation: 428 R Axis:   -3 Text Interpretation:  Sinus rhythm Low voltage, precordial leads Consider anterior infarct Baseline wander in lead(s) V2 No previous ECGs available Confirmed by Wandra Arthurs [id number]) on 01/31/2017 1:44:55 PM       Radiology Ct Angio Chest Pe W And/or Wo Contrast  Result Date: 01/31/2017 CLINICAL DATA:  Dyspnea x1 month without chest pain. EXAM: CT ANGIOGRAPHY CHEST WITH CONTRAST TECHNIQUE: Multidetector CT imaging of the chest was performed using the standard protocol during bolus administration of intravenous contrast. Multiplanar CT image reconstructions and MIPs were obtained to evaluate the vascular anatomy. CONTRAST:  132mL ISOVUE-370 IOPAMIDOL (ISOVUE-370) INJECTION 76% COMPARISON:  01/03/2017 FINDINGS: Cardiovascular: Satisfactory opacification of the pulmonary arterial system. No acute pulmonary embolus is noted. There is mild aortic atherosclerosis without aneurysm. Left main and three-vessel coronary arteriosclerosis is noted. Heart is  enlarged without pericardial effusion. Mediastinum/Nodes: Confluent soft tissue mass within the mediastinum along the paratracheal, pre carinal and subcarinal portion of the mediastinum narrowing the lumen of the right mainstem bronchus as well as causing mass effect and luminal narrowing of the right pulmonary artery and branches to the right upper lobe. Additionally, there is marked luminal extrinsic impression on the SVC and azygos veins causing venous collaterals along the anterior right chest wall and mediastinum via intercostals in particular the left superior intercostal vein via accessory hemiazygous. This mass is estimated at 6 x 5.5 x 5.8 cm. The thyroid, trachea and left mainstem bronchus are patent. Esophagus is not well characterized due to the adjacent mass. Lungs/Pleura: No effusion or pneumothorax. There is dependent atelectasis within both lower lobes and subsegmental atelectasis along the medial aspect of the right upper lobe. No postobstructive pneumonia. No dominant pulmonary mass. Mild hypoventilatory changes are believed to account for the bilateral ground-glass opacities noted. There are a few tiny subpleural nodular densities on the order of 3  mm or less bilaterally the upper lobes. Upper Abdomen: Bilateral adrenal nodules are noted on the left measuring 2.7 cm in maximum dimension and on the right 1.7 cm. Both demonstrate low Hounsfield units suggesting adenomas. Nonspecific hypervascular blush in the left hepatic lobe measuring 15 mm. Layering calculi are noted within the included gallbladder without wall thickening. Musculoskeletal: No aggressive nor suspicious osseous lesions. Review of the MIP images confirms the above findings. IMPRESSION: 1. 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  Additional luminal narrowing of the right main pulmonary artery and branches to the right upper lobe and luminal narrowing of the right mainstem bronchus. 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 Other consideration would include metastatic lymphadenopathy from other primary source of malignancy. 2. No acute pulmonary embolus. 3. Left main and three-vessel coronary arteriosclerosis. 4. 3 mm or less subpleural nodular densities are seen in the upper lobes bilaterally. No follow-up needed if patient is low-risk (and has no known or suspected primary neoplasm). Non-contrast chest CT can be considered in 12 months if patient is high-risk. This recommendation follows the consensus statement: Guidelines for Management of Incidental Pulmonary Nodules Detected on CT Images: From the Fleischner Society 2017; Radiology 2017; 284:228-243. 5. Left 2.7 cm and right 1.7 cm adrenal nodules favoring adenomas given low Hounsfield units within both nodules. 6. Nonspecific hypervascular blush within the left hepatic lobe measuring 15 mm. Hypervascular hepatic lobe lesion versus vascular anomaly. 7. Uncomplicated cholelithiasis. Aortic Atherosclerosis (ICD10-I70.0). Electronically Signed   By: Ashley Royalty M.D.   On: 01/31/2017 14:36    Procedures Procedures (including critical care time)  Medications Ordered in ED Medications  ipratropium-albuterol (DUONEB) 0.5-2.5 (3) MG/3ML nebulizer solution 3 mL (3 mLs Nebulization Given 01/31/17 1236)  methylPREDNISolone sodium succinate (SOLU-MEDROL) 125 mg/2 mL injection 125 mg (125 mg Intravenous Given 01/31/17 1322)  sodium chloride 0.9 % bolus 1,000 mL (0 mLs Intravenous Stopped 01/31/17 1422)  Racepinephrine HCl 2.25 % nebulizer solution 0.5 mL (0.5 mLs Nebulization Given 01/31/17 1417)  iopamidol (ISOVUE-370) 76 % injection 100 mL (100 mLs Intravenous Contrast Given 01/31/17 1404)     Initial  Impression / Assessment and Plan / ED Course  I have reviewed the triage vital signs and the nursing notes.  Pertinent labs & imaging results that were available during my care of the patient were reviewed by me and considered in my medical decision making (see chart for details).    Patient with shortness of breath and wheezing for 1 month.  Afebrile, initially tachypneic with some improvement while in the ED.  SPO2 saturations stable.  Some improvement after administration of DuoNeb and racemic epinephrine.  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  No evidence of PE.  2:57 PM  Spoke with oncologist Dr. Lebron Conners who recommends admission to the hospital with stat biopsy and consult to radiation oncology.  3:45 PM Spoke with Dr. Olevia Bowens, who agrees to assume care of patient and bring her into hospital for further evaluation and management.  Plan to transfer to  [facility] hospital.  3:55 PM Spoke with PA Lennette Bihari with IR, who states he will coordinate with Dr. Pascal Lux with IR and Dr. Lebron Conners to obtain biopsy.   Patient seen and evaluated by Dr. Darl Householder who agrees with assessment and plan at this time.   Final Clinical Impressions(s) / ED Diagnoses   Final diagnoses:  Mediastinal mass  SVC syndrome    ED Discharge Orders    None       Renita Papa, PA-C 01/31/17 1640    Drenda Freeze, MD 02/01/17 1005

## 2017-01-31 NOTE — ED Notes (Signed)
PT ambulated to restroom without difficulty.

## 2017-01-31 NOTE — H&P (Signed)
TRH H&P    Patient Demographics:    Cassidy Sanders, is a 76 y.o. female  MRN: [id number]  DOB - 1940-08-17  Admit Date - 01/31/2017  Referring MD/NP/PA: Patient transferred from [place] Primary MD for the patient is Patient, No Pcp Per   Chief Complaint  Patient presents with  . Shortness of Breath      HPI:    Cassidy Sanders  is a 76 y.o. female,.. With history of depression, benign meningioma status post resection, hypertension who came to hospital with intermittent shortness of breath for past 1 month.  Patient says that shortness of breath is worse on exertion.  She was seen on October 24 in the ED and prescribed azithromycin and albuterol for acute bronchitis.. The patient says that lung congestion symptoms improved with antibiotics but she continued to have wheezing with dyspnea on exertion.  She quit smoking 4 years ago. She lost 10-15 pounds weight loss over the past 1 month. Denies loss of appetite Denies chest pain Denies nausea, vomiting or diarrhea.  ED physician consulted oncology and IR, plan was to get biopsy per IR. IR recommends pulmonary consultation for bronchoscopy guided biopsy.   Review of systems:      All other systems reviewed and are negative.   With Past History of the following :    Past Medical History:  Diagnosis Date  . Brain tumor (benign) ([place])   . Depression   . Hypertension       Past Surgical History:  Procedure Laterality Date  . ABDOMINAL HYSTERECTOMY    . BLADDER SURGERY    . BRAIN SURGERY        Social History:      Social History   Tobacco Use  . Smoking status: Former Research scientist (life sciences)  . Smokeless tobacco: Never Used  Substance Use Topics  . Alcohol use: Yes    Comment: weekly       Family History :   Patient's mother was diagnosed with bladder cancer   Home Medications:   Prior to Admission medications    Medication Sig Start Date End Date Taking? Authorizing Provider  Biotin 2.5 MG TABS Take 2,500 mcg by mouth daily.   Yes [provider]  diphenhydrAMINE (BENADRYL) 25 MG tablet Take 25 mg by mouth at bedtime.   Yes [provider]  lisinopril-hydrochlorothiazide (PRINZIDE,ZESTORETIC) 20-25 MG tablet Take 1 tablet by mouth daily. 09/06/16  Yes [provider]  metoprolol tartrate (LOPRESSOR) 50 MG tablet Take 50 mg by mouth daily. 08/16/16 08/16/17 Yes [provider]  Multiple Vitamins-Minerals (ADULT GUMMY) CHEW Chew 1 tablet by mouth daily.   Yes [provider]  sertraline (ZOLOFT) 50 MG tablet Take 50 mg by mouth daily.   Yes [provider]  vitamin E 400 UNIT capsule Take 400 Units by mouth daily.   Yes [provider]     Allergies:     Allergies  Allergen Reactions  . Latex Dermatitis  . Gabapentin     Excessive sleep   .  Tape Rash     Physical Exam:   Vitals  Blood pressure 130/72, pulse 87, temperature 97.9 F (36.6 C), temperature source Oral, resp. rate 16, height 5\' 9"  (1.753 m), weight 88.4 kg (194 lb 14.2 oz), SpO2 94 %.  1.  General: Appears in no acute distress  2. Psychiatric:  Intact judgement and  insight, awake alert, oriented x 3.  3. Neurologic: No focal neurological deficits, all cranial nerves intact.Strength 5/5 all 4 extremities, sensation intact all 4 extremities, plantars down going.  4. Eyes :  anicteric sclerae, moist conjunctivae with no lid lag. PERRLA.  5. ENMT:  Oropharynx clear with moist mucous membranes and good dentition  6. Neck:  supple, no cervical lymphadenopathy appriciated, No thyromegaly  7. Respiratory : Normal respiratory effort, bilateral wheezing auscultated  8. Cardiovascular : RRR, no gallops, rubs or murmurs, no leg edema  9. Gastrointestinal:  Positive bowel sounds, abdomen soft, non-tender to palpation,no hepatosplenomegaly, no rigidity or guarding        10. Skin:  No cyanosis, normal texture and turgor, no rash, lesions or ulcers  11.Musculoskeletal:  Good muscle tone,  joints appear normal , no effusions,  normal range of motion    Data Review:    CBC Recent Labs  Lab 01/31/17 1309  WBC 11.3*  HGB 13.5  HCT 42.1  PLT 173  MCV 89.2  MCH 28.6  MCHC 32.1  RDW 15.1  LYMPHSABS 2.5  MONOABS 1.2*  EOSABS 0.1  BASOSABS 0.0   ------------------------------------------------------------------------------------------------------------------  Chemistries  Recent Labs  Lab 01/31/17 1309  NA 137  K 3.5  CL 102  CO2 28  GLUCOSE 74  BUN 25*  CREATININE 0.74  CALCIUM 9.2  AST 18  ALT 17  ALKPHOS 76  BILITOT 1.1   ------------------------------------------------------------------------------------------------------------------  ------------------------------------------------------------------------------------------------------------------ GFR: Estimated Creatinine Clearance: 68.7 mL/min (by C-G formula based on SCr of 0.74 mg/dL). Liver Function Tests: Recent Labs  Lab 01/31/17 1309  AST 18  ALT 17  ALKPHOS 76  BILITOT 1.1  PROT 6.8  ALBUMIN 3.6   No results for input(s): LIPASE, AMYLASE in the last 168 hours. No results for input(s): AMMONIA in the last 168 hours. Coagulation Profile: No results for input(s): INR, PROTIME in the last 168 hours. Cardiac Enzymes: Recent Labs  Lab 01/31/17 1309  TROPONINI <0.03    --------------------------------------------------------------------------------------------------------------- Urine analysis: No results found for: COLORURINE, APPEARANCEUR, LABSPEC, PHURINE, GLUCOSEU, HGBUR, BILIRUBINUR, KETONESUR, PROTEINUR, UROBILINOGEN, NITRITE, LEUKOCYTESUR    Imaging Results:    Ct Angio Chest Pe W And/or Wo Contrast  Result Date: 01/31/2017 CLINICAL DATA:  Dyspnea x1 month without chest pain. EXAM: CT ANGIOGRAPHY CHEST WITH CONTRAST TECHNIQUE: Multidetector  CT imaging of the chest was performed using the standard protocol during bolus administration of intravenous contrast. Multiplanar CT image reconstructions and MIPs were obtained to evaluate the vascular anatomy. CONTRAST:  115mL ISOVUE-370 IOPAMIDOL (ISOVUE-370) INJECTION 76% COMPARISON:  01/03/2017 FINDINGS: Cardiovascular: Satisfactory opacification of the pulmonary arterial system. No acute pulmonary embolus is noted. There is mild aortic atherosclerosis without aneurysm. Left main and three-vessel coronary arteriosclerosis is noted. Heart is enlarged without pericardial effusion. Mediastinum/Nodes: Confluent soft tissue mass within the mediastinum along the paratracheal, pre carinal and subcarinal portion of the mediastinum narrowing the lumen of the right mainstem bronchus as well as causing mass effect and luminal narrowing of the right pulmonary artery and branches to the right upper lobe. Additionally, there is marked luminal extrinsic impression on the SVC and azygos veins causing venous collaterals  along the anterior right chest wall and mediastinum via intercostals in particular the left superior intercostal vein via accessory hemiazygous. This mass is estimated at 6 x 5.5 x 5.8 cm. The thyroid, trachea and left mainstem bronchus are patent. Esophagus is not well characterized due to the adjacent mass. Lungs/Pleura: No effusion or pneumothorax. There is dependent atelectasis within both lower lobes and subsegmental atelectasis along the medial aspect of the right upper lobe. No postobstructive pneumonia. No dominant pulmonary mass. Mild hypoventilatory changes are believed to account for the bilateral ground-glass opacities noted. There are a few tiny subpleural nodular densities on the order of 3 mm or less bilaterally the upper lobes. Upper Abdomen: Bilateral adrenal nodules are noted on the left measuring 2.7 cm in maximum dimension and on the right 1.7 cm. Both demonstrate low Hounsfield units  suggesting adenomas. Nonspecific hypervascular blush in the left hepatic lobe measuring 15 mm. Layering calculi are noted within the included gallbladder without wall thickening. Musculoskeletal: No aggressive nor suspicious osseous lesions. Review of the MIP images confirms the above findings. IMPRESSION: 1. 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 Additional luminal narrowing of the right main pulmonary artery and branches to the right upper lobe and luminal narrowing of the right mainstem bronchus. 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 Other consideration would include metastatic lymphadenopathy from other primary source of malignancy. 2. No acute pulmonary embolus. 3. Left main and three-vessel coronary arteriosclerosis. 4. 3 mm or less subpleural nodular densities are seen in the upper lobes bilaterally. No follow-up needed if patient is low-risk (and has no known or suspected primary neoplasm). Non-contrast chest CT can be considered in 12 months if patient is high-risk. This recommendation follows the consensus statement: Guidelines for Management of Incidental Pulmonary Nodules Detected on CT Images: From the Fleischner Society 2017; Radiology 2017; 284:228-243. 5. Left 2.7 cm and right 1.7 cm adrenal nodules favoring adenomas given low Hounsfield units within both nodules. 6. Nonspecific hypervascular blush within the left hepatic lobe measuring 15 mm. Hypervascular hepatic lobe lesion versus vascular anomaly. 7. Uncomplicated cholelithiasis. Aortic Atherosclerosis (ICD10-I70.0). Electronically Signed   By: Ashley Royalty M.D.   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  Continue pulse oximetry, oxygen as needed.  I have consulted pulmonary, called and discussed with Dr. Elsworth Soho, they will see patient in a.m. and decide regarding bronchoscopic guided biopsy of the mediastinal mass. 2. Paratracheal mediastinal mass-large mass seen on the CTA chest.  Oncology has been consulted.  Dr. Lebron Conners following. 3. Lung nodules-CT chest also shows 3 mm or less subpleural nodular densities seen in the upper lobes bilaterally.  Noncontrast CT recommended in 12 months. 4. Hypertension-continue metoprolol, Zestoretic. 5. Depression-continue Zoloft    DVT Prophylaxis-   Lovenox   AM Labs Ordered, also please review Full Orders  Family Communication: Admission, patients condition and plan of care including tests being ordered have been discussed with the patient, daughter and husband at bedside who indicate  understanding and agree with the plan and Code Status.  Code Status: Full code  Admission status: Inpatient  Time spent in minutes : 60 minutes   Oswald Hillock M.D on 01/31/2017 at 8:12 PM  Between 7am to 7pm - Pager - [phone]. After 7pm go to [email or web address] - password [facility]  [facility] - Office  [phone]

## 2017-02-01 ENCOUNTER — Encounter (HOSPITAL_COMMUNITY): Payer: Self-pay | Admitting: Radiology

## 2017-02-01 ENCOUNTER — Inpatient Hospital Stay (HOSPITAL_COMMUNITY): Payer: Medicare Other

## 2017-02-01 DIAGNOSIS — R062 Wheezing: Secondary | ICD-10-CM

## 2017-02-01 DIAGNOSIS — R634 Abnormal weight loss: Secondary | ICD-10-CM

## 2017-02-01 DIAGNOSIS — I10 Essential (primary) hypertension: Secondary | ICD-10-CM

## 2017-02-01 DIAGNOSIS — R918 Other nonspecific abnormal finding of lung field: Secondary | ICD-10-CM | POA: Diagnosis present

## 2017-02-01 DIAGNOSIS — I871 Compression of vein: Secondary | ICD-10-CM

## 2017-02-01 DIAGNOSIS — J9859 Other diseases of mediastinum, not elsewhere classified: Secondary | ICD-10-CM

## 2017-02-01 DIAGNOSIS — R05 Cough: Secondary | ICD-10-CM

## 2017-02-01 DIAGNOSIS — Z87891 Personal history of nicotine dependence: Secondary | ICD-10-CM

## 2017-02-01 LAB — COMPREHENSIVE METABOLIC PANEL
ALBUMIN: 3.7 g/dL (ref 3.5–5.0)
ALK PHOS: 85 U/L (ref 38–126)
ALT: 19 U/L (ref 14–54)
ANION GAP: 10 (ref 5–15)
AST: 28 U/L (ref 15–41)
BUN: 21 mg/dL — ABNORMAL HIGH (ref 6–20)
CALCIUM: 9.4 mg/dL (ref 8.9–10.3)
CO2: 25 mmol/L (ref 22–32)
Chloride: 106 mmol/L (ref 101–111)
Creatinine, Ser: 0.8 mg/dL (ref 0.44–1.00)
GFR calc non Af Amer: >60 mL/min (ref >60)
GLUCOSE: 145 mg/dL — AB (ref 65–99)
POTASSIUM: 3.9 mmol/L (ref 3.5–5.1)
Sodium: 141 mmol/L (ref 135–145)
Total Bilirubin: 0.7 mg/dL (ref 0.3–1.2)
Total Protein: 7.4 g/dL (ref 6.5–8.1)

## 2017-02-01 LAB — CBC
HCT: 43.9 % (ref 36.0–46.0)
HEMOGLOBIN: 14 g/dL (ref 12.0–15.0)
MCH: 28.6 pg (ref 26.0–34.0)
MCHC: 31.9 g/dL (ref 30.0–36.0)
MCV: 89.8 fL (ref 78.0–100.0)
PLATELETS: 175 10*3/uL (ref 150–400)
RBC: 4.89 MIL/uL (ref 3.87–5.11)
RDW: 14.7 % (ref 11.5–15.5)
WBC: 10.5 10*3/uL (ref 4.0–10.5)

## 2017-02-01 MED ORDER — IOPAMIDOL (ISOVUE-300) INJECTION 61%
INTRAVENOUS | Status: AC
  Filled 2017-02-01: qty 30

## 2017-02-01 MED ORDER — IOPAMIDOL (ISOVUE-300) INJECTION 61%
INTRAVENOUS | Status: AC
  Filled 2017-02-01: qty 100

## 2017-02-01 MED ORDER — PANTOPRAZOLE SODIUM 40 MG PO TBEC
40.0000 mg | DELAYED_RELEASE_TABLET | Freq: Two times a day (BID) | ORAL | Status: DC
  Administered 2017-02-01 – 2017-02-07 (×12): 40 mg via ORAL
  Filled 2017-02-01 (×12): qty 1

## 2017-02-01 MED ORDER — IOPAMIDOL (ISOVUE-300) INJECTION 61%
100.0000 mL | Freq: Once | INTRAVENOUS | Status: AC | PRN
  Administered 2017-02-01: 100 mL via INTRAVENOUS

## 2017-02-01 MED ORDER — IOPAMIDOL (ISOVUE-300) INJECTION 61%: 15.0000 mL | Freq: Once | INTRAVENOUS | Status: DC | PRN

## 2017-02-01 MED ORDER — BISOPROLOL FUMARATE 5 MG PO TABS
5.0000 mg | ORAL_TABLET | Freq: Every day | ORAL | Status: DC
  Administered 2017-02-01 – 2017-02-08 (×7): 5 mg via ORAL
  Filled 2017-02-01 (×7): qty 1

## 2017-02-01 NOTE — Consult Note (Signed)
Reason for Referral: Mediastinal mass  HPI: 76 year old woman currently of [place] where she resides with her husband.  She is the primary caregiver for her husband who has health issues.  She presented with symptoms of dyspnea and wheezing for the last month.  She was evaluated in the emergency department and a CT scan of the chest showed a 6.5 x 5.5 by 5.8 cm peritracheal mediastinal mass causing luminal narrowing of the SVC and SVC syndrome resulting in anterior chest wall collaterals via the hemi-azygos and intercostal veins.  No acute embolus was noted with small subpleural nodular densities noted in the upper lobes of the lung.  She was started on Solu-Medrol some improvement in her symptoms.  Clinically reports she feels reasonably fair while in bed without exertion.  He is able to eat and maintaining her appetite.  She did report about 15 pound weight loss.  She has remote smoking history.    She does not report any headaches or neurological deficits.  She does not report any altered mental status or seizures.  She does not report any fevers or chills or sweats.  She does not report any palpitation, orthopnea or leg edema.  She does report cough and wheezing.  She does not report any nausea, vomiting or abdominal pain.  She does not report any frequency urgency or hesitancy.  She does not report any skeletal complaints.  Remaining review of systems unremarkable.  Past Medical History:  Diagnosis Date  . Brain tumor (benign) ([place])   . Depression   . Hypertension   :  Past Surgical History:  Procedure Laterality Date  . ABDOMINAL HYSTERECTOMY    . BLADDER SURGERY    . BRAIN SURGERY    :   Current Facility-Administered Medications:  .  0.9 %  sodium chloride infusion, , Intravenous, Continuous, Darrick Meigs, Marge Duncans, MD, Last Rate: 10 mL/hr at 01/31/17 2050 .  acetaminophen (TYLENOL) tablet 650 mg, 650 mg, Oral, Q6H PRN **OR** acetaminophen (TYLENOL) suppository 650 mg, 650 mg, Rectal, Q6H  PRN, Darrick Meigs, Marge Duncans, MD .  albuterol (PROVENTIL) (2.5 MG/3ML) 0.083% nebulizer solution 2.5 mg, 2.5 mg, Nebulization, Q4H PRN, Charlynne Cousins, MD .  enoxaparin (LOVENOX) injection 40 mg, 40 mg, Subcutaneous, Q24H, Darrick Meigs, Marge Duncans, MD, 40 mg at 01/31/17 2233 .  lisinopril (PRINIVIL,ZESTRIL) tablet 20 mg, 20 mg, Oral, Daily **AND** hydrochlorothiazide (HYDRODIURIL) tablet 25 mg, 25 mg, Oral, Daily, Charlynne Cousins, MD .  ipratropium-albuterol (DUONEB) 0.5-2.5 (3) MG/3ML nebulizer solution 3 mL, 3 mL, Nebulization, TID, Charlynne Cousins, MD, 3 mL at 02/01/17 0834 .  metoprolol tartrate (LOPRESSOR) tablet 50 mg, 50 mg, Oral, Daily, Darrick Meigs, Gagan S, MD .  ondansetron (ZOFRAN) tablet 4 mg, 4 mg, Oral, Q6H PRN **OR** ondansetron (ZOFRAN) injection 4 mg, 4 mg, Intravenous, Q6H PRN, Darrick Meigs, Marge Duncans, MD .  sertraline (ZOLOFT) tablet 50 mg, 50 mg, Oral, Daily, Darrick Meigs, Marge Duncans, MD .  vitamin E capsule 400 Units, 400 Units, Oral, Daily, Darrick Meigs, Marge Duncans, MD:  Allergies  Allergen Reactions  . Latex Dermatitis  . Gabapentin     Excessive sleep   . Tape Rash  :  No family history on file.:  Social History   Socioeconomic History  . Marital status: Married    Spouse name: Not on file  . Number of children: Not on file  . Years of education: Not on file  . Highest education level: Not on file  Social Needs  . Financial resource strain: Not on  file  . Food insecurity - worry: Not on file  . Food insecurity - inability: Not on file  . Transportation needs - medical: Not on file  . Transportation needs - non-medical: Not on file  Occupational History  . Not on file  Tobacco Use  . Smoking status: Former Research scientist (life sciences)  . Smokeless tobacco: Never Used  Substance and Sexual Activity  . Alcohol use: Yes    Comment: weekly  . Drug use: No  . Sexual activity: Not on file  Other Topics Concern  . Not on file  Social History Narrative  . Not on file  :  Pertinent items are noted in  HPI.  Exam: Blood pressure 133/75, pulse 100, temperature 98 F (36.7 C), temperature source Oral, resp. rate 18, height 5\' 9"  (1.753 m), weight 194 lb 14.2 oz (88.4 kg), SpO2 100 %. General appearance: alert and cooperative appeared without distress. Throat: No oral ulcers or thrush. Neck: no adenopathy or neck masses. Resp: Expiratory wheezes noted bilaterally. Chest wall: no tenderness Cardio: regular rate and rhythm, S1, S2 normal, no murmur, click, rub or gallop GI: soft, non-tender; bowel sounds normal; no masses,  no organomegaly Extremities: extremities normal, atraumatic, no cyanosis or edema Pulses: 2+ and symmetric Skin: Skin color, texture, turgor normal. No rashes or lesions  Recent Labs    01/31/17 1309 02/01/17 0409  WBC 11.3* 10.5  HGB 13.5 14.0  HCT 42.1 43.9  PLT 173 175   Recent Labs    01/31/17 1309 02/01/17 0409  NA 137 141  K 3.5 3.9  CL 102 106  CO2 28 25  GLUCOSE 74 145*  BUN 25* 21*  CREATININE 0.74 0.80  CALCIUM 9.2 9.4     Dg Chest 2 View  Result Date: 01/03/2017 CLINICAL DATA:  Cough since Monday, wheezing. EXAM: CHEST  2 VIEW COMPARISON:  None. FINDINGS: Abnormal soft tissue is noted in the right mediastinum/peritracheal region or right suprahilar upper lobe. Cannot exclude suprahilar or mediastinal mass. No confluent opacity on the left. Heart is upper limits normal in size. No effusions or acute bony abnormality. IMPRESSION: Abnormal right suprahilar and/or right paratracheal soft tissue. Cannot exclude mass. Recommend further evaluation with chest CT with IV contrast. Electronically Signed   By: Rolm Baptise M.D.   On: 01/03/2017 13:10   Ct Angio Chest Pe W And/or Wo Contrast  Result Date: 01/31/2017 CLINICAL DATA:  Dyspnea x1 month without chest pain. EXAM: CT ANGIOGRAPHY CHEST WITH CONTRAST TECHNIQUE: Multidetector CT imaging of the chest was performed using the standard protocol during bolus administration of intravenous contrast.  Multiplanar CT image reconstructions and MIPs were obtained to evaluate the vascular anatomy. CONTRAST:  111mL ISOVUE-370 IOPAMIDOL (ISOVUE-370) INJECTION 76% COMPARISON:  01/03/2017 FINDINGS: Cardiovascular: Satisfactory opacification of the pulmonary arterial system. No acute pulmonary embolus is noted. There is mild aortic atherosclerosis without aneurysm. Left main and three-vessel coronary arteriosclerosis is noted. Heart is enlarged without pericardial effusion. Mediastinum/Nodes: Confluent soft tissue mass within the mediastinum along the paratracheal, pre carinal and subcarinal portion of the mediastinum narrowing the lumen of the right mainstem bronchus as well as causing mass effect and luminal narrowing of the right pulmonary artery and branches to the right upper lobe. Additionally, there is marked luminal extrinsic impression on the SVC and azygos veins causing venous collaterals along the anterior right chest wall and mediastinum via intercostals in particular the left superior intercostal vein via accessory hemiazygous. This mass is estimated at 6 x 5.5 x 5.8 cm.  The thyroid, trachea and left mainstem bronchus are patent. Esophagus is not well characterized due to the adjacent mass. Lungs/Pleura: No effusion or pneumothorax. There is dependent atelectasis within both lower lobes and subsegmental atelectasis along the medial aspect of the right upper lobe. No postobstructive pneumonia. No dominant pulmonary mass. Mild hypoventilatory changes are believed to account for the bilateral ground-glass opacities noted. There are a few tiny subpleural nodular densities on the order of 3 mm or less bilaterally the upper lobes. Upper Abdomen: Bilateral adrenal nodules are noted on the left measuring 2.7 cm in maximum dimension and on the right 1.7 cm. Both demonstrate low Hounsfield units suggesting adenomas. Nonspecific hypervascular blush in the left hepatic lobe measuring 15 mm. Layering calculi are noted  within the included gallbladder without wall thickening. Musculoskeletal: No aggressive nor suspicious osseous lesions. Review of the MIP images confirms the above findings. IMPRESSION: 1. 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 Additional luminal narrowing of the right main pulmonary artery and branches to the right upper lobe and luminal narrowing of the right mainstem bronchus. 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 Other consideration would include metastatic lymphadenopathy from other primary source of malignancy. 2. No acute pulmonary embolus. 3. Left main and three-vessel coronary arteriosclerosis. 4. 3 mm or less subpleural nodular densities are seen in the upper lobes bilaterally. No follow-up needed if patient is low-risk (and has no known or suspected primary neoplasm). Non-contrast chest CT can be considered in 12 months if patient is high-risk. This recommendation follows the consensus statement: Guidelines for Management of Incidental Pulmonary Nodules Detected on CT Images: From the Fleischner Society 2017; Radiology 2017; 284:228-243. 5. Left 2.7 cm and right 1.7 cm adrenal nodules favoring adenomas given low Hounsfield units within both nodules. 6. Nonspecific hypervascular blush within the left hepatic lobe measuring 15 mm. Hypervascular hepatic lobe lesion versus vascular anomaly. 7. Uncomplicated cholelithiasis. Aortic Atherosclerosis (ICD10-I70.0). Electronically Signed   By: Ashley Royalty M.D.   On: 01/31/2017 14:36    Assessment and Plan:   76 year old woman with the following issues:  1.  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  These findings appear to  be chronic with anterior chest wall collaterals noted.  She appears to be hemodynamically stable without any symptoms at rest.  He has been started on Solu-Medrol at this time.  From a management standpoint, I recommend obtaining tissue biopsy before starting treatment at this time.  The differential diagnosis would include primary lung malignancy such as small cell lung cancer, lymphoma or other metastatic solid tumor malignancy with lymphadenopathy.  I also recommend obtaining imaging studies of the abdomen and pelvis to complete the staging.  Depending on the tissue biopsy she would require radiation therapy to the chest or chemotherapy or possibly both.  I see no urgent need to start therapy at this time and prefer to obtain tissue biopsy first.  Please consult pulmonary medicine for evaluation for a bronchoscopy.  2.  Wheezing: Related to her mediastinal mass.  Respiratory status appeared stable.  Pulmonary medicine input would be appreciated as well regarding her respiratory status.  We will continue to follow with you.

## 2017-02-01 NOTE — Progress Notes (Addendum)
PROGRESS NOTE  Cassidy Sanders  RJJ:[id number] DOB: 04/05/1940 DOA: 01/31/2017 PCP: Patient, No Pcp Per  Brief Narrative:   The patient is a 76 year old female with history of depression, benign meningioma status post resection, hypertension who presented with chest congestion and shortness of breath for the last month.  She has noticed some is between expiratory wheezing coming from her chest but attributed to bronchitis or pneumonia because her husband has been sick with the same.  She presented to the emergency department because she could not get into see her PCP and she felt Maat Kafer of breath.  She has not been hypoxic and her blood pressures have been stable.  Pulmonology and oncology have been consulted.  Assessment & Plan:   Active Problems:   SVC syndrome   Essential hypertension   Lung mass   Mediastinal mass  Mediastinal mass but currently on room air with stable blood pressures.  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 Alen Blew -  CT of the abdomen and pelvis demonstrates no definite evidence of metastatic disease however she has bilateral low-attenuation adrenal nodules which are probably benign adenomas but this could also represent lung cancer spread to the adrenals which is common -  She will need an outpatient PET scan  Essential hypertension, pressures elevated, patient has been taken off her ACE inhibitor and started on bisoprolol in place of her metoprolol per pulmonology in case this may also be contributing to her wheeze  Depression, stable, continue Zoloft  Ectatic abdominal aorta -Will need repeat ultrasound in 5 years  DVT prophylaxis: SCDs Code Status: Full code Family Communication: Patient alone Disposition Plan: Hopefully after bronc with biopsy   Consultants:   Oncology  and pulmonology  Procedures:  None  Antimicrobials:  Anti-infectives (From admission, onward)   None       Subjective:  States that she continues to have some whistle when she breathes and some shortness of breath with exertion but otherwise she is feeling well.  Denies weight loss, night sweats, fevers.  She feels like her cough is somewhat better after coming to the hospital.  Objective: Vitals:   01/31/17 2125 02/01/17 0405 02/01/17 0834 02/01/17 1406  BP:  133/75  (!) 150/79  Pulse:  100  83  Resp:  18  16  Temp:  98 F (36.7 C)  97.8 F (36.6 C)  TempSrc:  Oral  Oral  SpO2: 92% 91% 100% 95%  Weight:      Height:        Intake/Output Summary (Last 24 hours) at 02/01/2017 1527 Last data filed at 02/01/2017 0447 Gross per 24 hour  Intake 319.5 ml  Output -  Net 319.5 ml   Filed Weights   01/31/17 1156 01/31/17 1854  Weight: 88.5 kg (195 lb) 88.4 kg (194 lb 14.2 oz)    Examination:  General exam:  Adult female.  No acute distress.  HEENT:  NCAT, MMM, face does not appear swollen Respiratory system:   Stridor and wheeze present but the patient does not have any increased work of breathing, no focal rales or rhonchi Cardiovascular system: Regular rate and rhythm, normal S1/S2. No murmurs, rubs, gallops or clicks.  Warm extremities Gastrointestinal system: Normal active bowel sounds, soft, nondistended, nontender. MSK:  Normal tone and bulk, no lower extremity edema Neuro:  Grossly intact  Data Reviewed: I have personally reviewed following labs and imaging studies  CBC: Recent Labs  Lab 01/31/17 1309 02/01/17 0409  WBC 11.3* 10.5  NEUTROABS 7.5  --   HGB 13.5 14.0  HCT 42.1 43.9  MCV 89.2 89.8  PLT 173 683   Basic Metabolic Panel: Recent Labs  Lab 01/31/17 1309 02/01/17 0409  NA 137 141  K 3.5 3.9  CL 102 106  CO2 28 25  GLUCOSE 74 145*  BUN 25* 21*  CREATININE 0.74 0.80  CALCIUM 9.2 9.4   GFR: Estimated Creatinine Clearance: 68.7  mL/min (by C-G formula based on SCr of 0.8 mg/dL). Liver Function Tests: Recent Labs  Lab 01/31/17 1309 02/01/17 0409  AST 18 28  ALT 17 19  ALKPHOS 76 85  BILITOT 1.1 0.7  PROT 6.8 7.4  ALBUMIN 3.6 3.7   No results for input(s): LIPASE, AMYLASE in the last 168 hours. No results for input(s): AMMONIA in the last 168 hours. Coagulation Profile: No results for input(s): INR, PROTIME in the last 168 hours. Cardiac Enzymes: Recent Labs  Lab 01/31/17 1309  TROPONINI <0.03   BNP (last 3 results) No results for input(s): PROBNP in the last 8760 hours. HbA1C: No results for input(s): HGBA1C in the last 72 hours. CBG: No results for input(s): GLUCAP in the last 168 hours. Lipid Profile: No results for input(s): CHOL, HDL, LDLCALC, TRIG, CHOLHDL, LDLDIRECT in the last 72 hours. Thyroid Function Tests: No results for input(s): TSH, T4TOTAL, FREET4, T3FREE, THYROIDAB in the last 72 hours. Anemia Panel: No results for input(s): VITAMINB12, FOLATE, FERRITIN, TIBC, IRON, RETICCTPCT in the last 72 hours. Urine analysis: No results found for: COLORURINE, APPEARANCEUR, LABSPEC, PHURINE, GLUCOSEU, HGBUR, BILIRUBINUR, KETONESUR, PROTEINUR, UROBILINOGEN, NITRITE, LEUKOCYTESUR Sepsis Labs: @LABRCNTIP (procalcitonin:4,lacticidven:4)  )No results found for this or any previous visit (from the past 240 hour(s)).    Radiology Studies: Ct Angio Chest Pe W And/or Wo Contrast  Result Date: 01/31/2017 CLINICAL DATA:  Dyspnea x1 month without chest pain. EXAM: CT ANGIOGRAPHY CHEST WITH CONTRAST TECHNIQUE: Multidetector CT imaging of the chest was performed using the standard protocol during bolus administration of intravenous contrast. Multiplanar CT image reconstructions and MIPs were obtained to evaluate the vascular anatomy. CONTRAST:  129mL ISOVUE-370 IOPAMIDOL (ISOVUE-370) INJECTION 76% COMPARISON:  01/03/2017 FINDINGS: Cardiovascular: Satisfactory opacification of the pulmonary arterial system.  No acute pulmonary embolus is noted. There is mild aortic atherosclerosis without aneurysm. Left main and three-vessel coronary arteriosclerosis is noted. Heart is enlarged without pericardial effusion. Mediastinum/Nodes: Confluent soft tissue mass within the mediastinum along the paratracheal, pre carinal and subcarinal portion of the mediastinum narrowing the lumen of the right mainstem bronchus as well as causing mass effect and luminal narrowing of the right pulmonary artery and branches to the right upper lobe. Additionally, there is marked luminal extrinsic impression on the SVC and azygos veins causing venous collaterals along the anterior right chest wall and mediastinum via intercostals in particular the left superior intercostal vein via accessory hemiazygous. This mass is estimated at 6 x 5.5 x 5.8 cm. The thyroid, trachea and left mainstem bronchus are patent. Esophagus is not well characterized due to the adjacent mass. Lungs/Pleura: No effusion or pneumothorax. There is dependent atelectasis within both lower lobes and subsegmental atelectasis along the medial aspect of the right upper lobe. No postobstructive pneumonia. No dominant pulmonary mass. Mild hypoventilatory changes are believed to account for the bilateral ground-glass opacities noted. There are a few tiny subpleural nodular densities on the order of  3 mm or less bilaterally the upper lobes. Upper Abdomen: Bilateral adrenal nodules are noted on the left measuring 2.7 cm in maximum dimension and on the right 1.7 cm. Both demonstrate low Hounsfield units suggesting adenomas. Nonspecific hypervascular blush in the left hepatic lobe measuring 15 mm. Layering calculi are noted within the included gallbladder without wall thickening. Musculoskeletal: No aggressive nor suspicious osseous lesions. Review of the MIP images confirms the above findings. IMPRESSION: 1. 6.5 x 5.5 x 5.8 cm right paratracheal mediastinal mass causing marked luminal  narrowing of the SVC and SVC syndrome resulting in anterior chest wall collaterals and collaterals via the hemi azygous and intracostal veins of the mediastinum. Additional luminal narrowing of the right main pulmonary artery and branches to the right upper lobe and luminal narrowing of the right mainstem bronchus. 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 Other consideration would include metastatic lymphadenopathy from other primary source of malignancy. 2. No acute pulmonary embolus. 3. Left main and three-vessel coronary arteriosclerosis. 4. 3 mm or less subpleural nodular densities are seen in the upper lobes bilaterally. No follow-up needed if patient is low-risk (and has no known or suspected primary neoplasm). Non-contrast chest CT can be considered in 12 months if patient is high-risk. This recommendation follows the consensus statement: Guidelines for Management of Incidental Pulmonary Nodules Detected on CT Images: From the Fleischner Society 2017; Radiology 2017; 284:228-243. 5. Left 2.7 cm and right 1.7 cm adrenal nodules favoring adenomas given low Hounsfield units within both nodules. 6. Nonspecific hypervascular blush within the left hepatic lobe measuring 15 mm. Hypervascular hepatic lobe lesion versus vascular anomaly. 7. Uncomplicated cholelithiasis. Aortic Atherosclerosis (ICD10-I70.0). Electronically Signed   By: Ashley Royalty M.D.   On: 01/31/2017 14:36   Ct Abdomen Pelvis W Contrast  Result Date: 02/01/2017 CLINICAL DATA:  76 year old female with a newly diagnosed right hilar mass. CT scan of the abdomen and pelvis ordered for staging. EXAM: CT ABDOMEN AND PELVIS WITH CONTRAST TECHNIQUE: Multidetector CT imaging of the abdomen and pelvis was performed using the standard protocol following bolus administration of intravenous contrast. CONTRAST:  174mL ISOVUE-300 IOPAMIDOL (ISOVUE-300) INJECTION 61% COMPARISON:  CT  scan of the chest 01/31/2017 FINDINGS: Lower chest: Minimal dependent atelectasis. The right hilar mass is not included in the field of view. The heart is normal in size. Coronary artery calcifications are present. No pericardial effusion. Unremarkable visualized distal thoracic esophagus. Hepatobiliary: Normal hepatic morphology and contours. No evidence of hepatic metastatic disease. The portal veins are patent. High attenuation material layers within the gallbladder lumen consistent with cholelithiasis. No biliary ductal dilatation. Pancreas: Unremarkable. No pancreatic ductal dilatation or surrounding inflammatory changes. Spleen: Normal in size without focal abnormality. Adrenals/Urinary Tract: 1.9 cm low-attenuation left adrenal nodule is favored to represent a benign adenoma. A 1.5 cm right adrenal nodule is also low in attenuation and favored to be benign in nature. No evidence of enhancing renal mass, hydronephrosis or nephrolithiasis. Tiny subcentimeter low-attenuation renal lesions are too small to characterize and statistically highly likely benign cysts. The ureters and bladder are unremarkable. Stomach/Bowel: No evidence of obstruction or focal bowel wall thickening. Normal appendix in the right lower quadrant. The terminal ileum is unremarkable. Vascular/Lymphatic: Scattered atherosclerotic vascular calcifications including along the length of the abdominal aorta. The infrarenal abdominal aorta is ectatic measuring up to 2.6 cm in diameter. No suspicious lymphadenopathy. Reproductive: Surgical changes of prior hysterectomy. Unremarkable adnexal without evidence of mass. Other: No free  fluid.  Small fat containing umbilical hernia. Musculoskeletal: No acute fracture or aggressive appearing lytic or blastic osseous lesion. Multilevel degenerative disc disease and facet arthropathy. Compression deformity of the superior endplate of Y64 without focal lucency is favored to be chronic in nature. IMPRESSION:  1. No definite evidence of metastatic disease within the abdomen or pelvis. 2. Bilateral low-attenuation adrenal nodules are favored to represent benign adenomas. However, attention is recommended on follow-up PET-CT imaging to assess for superimposed hypermetabolism which could indicate a collision tumor. 3. Ectatic abdominal aorta at risk for aneurysm development. Recommend followup by ultrasound in 5 years. This recommendation follows ACR consensus guidelines: White Paper of the ACR Incidental Findings Committee II on Vascular Findings. J Am Coll Radiol 2013; 15:830-940. 4.  Aortic Atherosclerosis (ICD10-170.0) 5. Multilevel degenerative disc disease and facet arthropathy. 6. T12 compression deformity with approximately 40% height loss anteriorly appears chronic but is age indeterminate without prior imaging for comparison. Electronically Signed   By: Jacqulynn Cadet M.D.   On: 02/01/2017 12:40     Scheduled Meds: . [START ON 02/02/2017] bisoprolol  5 mg Oral Daily  . iopamidol      . iopamidol      . ipratropium-albuterol  3 mL Nebulization TID  . pantoprazole  40 mg Oral BID AC  . sertraline  50 mg Oral Daily  . vitamin E  400 Units Oral Daily   Continuous Infusions: . sodium chloride 10 mL/hr at 01/31/17 2050     LOS: 1 day    Time spent: 30 min    Janece Canterbury, MD [facility] Pager [phone]  If 7PM-7AM, please contact night-coverage [email or web address] Password [email or web address] 02/01/2017, 3:27 PM

## 2017-02-01 NOTE — Consult Note (Addendum)
PULMONARY / CRITICAL CARE MEDICINE   Name: Cassidy Sanders MRN: [id number] DOB: 1940-05-26    ADMISSION DATE:  01/31/2017 CONSULTATION DATE:  02/01/17  REFERRING MD:  Sheliah Plane / Frederich Chick  CHIEF COMPLAINT:  Sob/wheeze  HISTORY OF PRESENT ILLNESS:   43 yowf quit smoking 4 y ago s apparent sequelae with progressive doe with audible wheeze x 1 month indolent onset progressive but still comfortable at rest and supine > to ER 01/31/17 with peritracheal mass with  SVC syndrome criteria but no clinical correlation and collaterals suggest this is long standing.  She did have one episode of epistaxis one month ago but none since.  She is losing wt but denies dyphagia/ anorexia/ fever/ sweats. Breathing no better with hfa saba - doe= MMRC3 = can't walk 100 yards even at a slow pace at a flat grade s stopping due to sob  - feels wheezing some better since admit and rx steoids/ note also on ACEi    No obvious day to day or daytime variability or assoc excess/ purulent sputum or mucus plugs or  cp or chest tightness,   overt sinus or hb symptoms. No unusual exp hx or h/o childhood pna/ asthma or knowledge of premature birth.  Sleeping ok flat/ prefers L side down without nocturnal  or early am exacerbation  of respiratory  c/o's or need for noct saba. Also denies any obvious fluctuation of symptoms with weather or environmental changes or other aggravating or alleviating factors except as outlined above   Current Allergies, Complete Past Medical History, Past Surgical History, Family History, and Social History were reviewed in [employer] record.  ROS  The following are not active complaints unless bolded Hoarseness, sore throat, dysphagia, dental problems, itching, sneezing,  nasal congestion or discharge of excess mucus or purulent secretions, ear ache,   fever, chills, sweats, unintended wt loss or wt gain, classically pleuritic or exertional cp,  orthopnea pnd or leg swelling,  presyncope, palpitations, abdominal pain, anorexia, nausea, vomiting, diarrhea  or change in bowel habits or change in bladder habits, change in stools or change in urine, dysuria, hematuria,  rash, arthralgias, visual complaints, headache, numbness, weakness or ataxia or problems with walking or coordination,  change in mood/affect or memory.        Current Meds  Medication Sig  . Biotin 2.5 MG TABS Take 2,500 mcg by mouth daily.  . diphenhydrAMINE (BENADRYL) 25 MG tablet Take 25 mg by mouth at bedtime.  Marland Kitchen lisinopril-hydrochlorothiazide (PRINZIDE,ZESTORETIC) 20-25 MG tablet Take 1 tablet by mouth daily.  . metoprolol tartrate (LOPRESSOR) 50 MG tablet Take 50 mg by mouth daily.  . Multiple Vitamins-Minerals (ADULT GUMMY) CHEW Chew 1 tablet by mouth daily.  . sertraline (ZOLOFT) 50 MG tablet Take 50 mg by mouth daily.  . vitamin E 400 UNIT capsule Take 400 Units by mouth daily.      PAST MEDICAL HISTORY :  She  has a past medical history of Brain tumor (benign) ([place]), Depression, and Hypertension.  PAST SURGICAL HISTORY: She  has a past surgical history that includes Abdominal hysterectomy; Bladder surgery; and Brain surgery.  Allergies  Allergen Reactions  . Latex Dermatitis  . Gabapentin     Excessive sleep   . Tape Rash    No current facility-administered medications on file prior to encounter.    Current Outpatient Medications on File Prior to Encounter  Medication Sig  . Biotin 2.5 MG TABS Take 2,500 mcg by mouth daily.  . diphenhydrAMINE (BENADRYL)  25 MG tablet Take 25 mg by mouth at bedtime.  Marland Kitchen lisinopril-hydrochlorothiazide (PRINZIDE,ZESTORETIC) 20-25 MG tablet Take 1 tablet by mouth daily.  . metoprolol tartrate (LOPRESSOR) 50 MG tablet Take 50 mg by mouth daily.  . Multiple Vitamins-Minerals (ADULT GUMMY) CHEW Chew 1 tablet by mouth daily.  . sertraline (ZOLOFT) 50 MG tablet Take 50 mg by mouth daily.  . vitamin E 400 UNIT capsule Take 400 Units by mouth daily.     FAMILY HISTORY:  Her has no family status information on file.    SOCIAL HISTORY: She  reports that she has quit smoking. she has never used smokeless tobacco. She reports that she drinks alcohol. She reports that she does not use drugs.    SUBJECTIVE:  No complaints at rest at 30degrees HOB/ 2lpm   VITAL SIGNS: BP 133/75 (BP Location: Left Arm)   Pulse 100   Temp 98 F (36.7 C) (Oral)   Resp 18   Ht 5\' 9"  (1.753 m)   Wt 194 lb 14.2 oz (88.4 kg)   SpO2 100% Comment: Pt has a cont. pulse ox  BMI 28.78 kg/m   HEMODYNAMICS:    VENTILATOR SETTINGS:    INTAKE / OUTPUT: I/O last 3 completed shifts: In: 319.5 [P.O.:240; I.V.:79.5] Out: -   PHYSICAL EXAMINATION: General:   Pleasant elderl;y wf with audible wheeze insp > exp heard across the room/ worse with FIVC  Wt Readings from Last 3 Encounters:  01/31/17 194 lb 14.2 oz (88.4 kg)  01/03/17 198 lb 6.6 oz (90 kg)    Vital signs reviewed  HEENT: nl dentition, turbinates bilaterally, and oropharynx. Nl external ear canals without cough reflex   NECK :  without JVD/Nodes/TM/ nl carotid upstrokes bilaterally   LUNGS: no acc muscle use,  Nl contour chest  With insp> exp rhonchi mostly upper airway   CV:  RRR  no s3 or murmur or increase in P2, and no edema   ABD:  soft and nontender with nl inspiratory excursion in the supine position. No bruits or organomegaly appreciated, bowel sounds nl  MS:  Nl gait/ ext warm without deformities, calf tenderness, cyanosis or clubbing No obvious joint restrictions   SKIN: warm and dry without lesions    NEURO:  alert, approp, nl sensorium with  no motor or cerebellar deficits apparent.      LABS:  BMET Recent Labs  Lab 01/31/17 1309 02/01/17 0409  NA 137 141  K 3.5 3.9  CL 102 106  CO2 28 25  BUN 25* 21*  CREATININE 0.74 0.80  GLUCOSE 74 145*    Electrolytes Recent Labs  Lab 01/31/17 1309 02/01/17 0409  CALCIUM 9.2 9.4    CBC Recent Labs  Lab  01/31/17 1309 02/01/17 0409  WBC 11.3* 10.5  HGB 13.5 14.0  HCT 42.1 43.9  PLT 173 175    Coag's No results for input(s): APTT, INR in the last 168 hours.  Sepsis Markers No results for input(s): LATICACIDVEN, PROCALCITON, O2SATVEN in the last 168 hours.  ABG No results for input(s): PHART, PCO2ART, PO2ART in the last 168 hours.  Liver Enzymes Recent Labs  Lab 01/31/17 1309 02/01/17 0409  AST 18 28  ALT 17 19  ALKPHOS 76 85  BILITOT 1.1 0.7  ALBUMIN 3.6 3.7    Cardiac Enzymes Recent Labs  Lab 01/31/17 1309  TROPONINI <0.03    Glucose No results for input(s): GLUCAP in the last 168 hours.  Imaging Ct Angio Chest Pe W And/or Wo  Contrast  Result Date: 01/31/2017 CLINICAL DATA:  Dyspnea x1 month without chest pain. EXAM: CT ANGIOGRAPHY CHEST WITH CONTRAST TECHNIQUE: Multidetector CT imaging of the chest was performed using the standard protocol during bolus administration of intravenous contrast. Multiplanar CT image reconstructions and MIPs were obtained to evaluate the vascular anatomy. CONTRAST:  120mL ISOVUE-370 IOPAMIDOL (ISOVUE-370) INJECTION 76% COMPARISON:  01/03/2017 FINDINGS: Cardiovascular: Satisfactory opacification of the pulmonary arterial system. No acute pulmonary embolus is noted. There is mild aortic atherosclerosis without aneurysm. Left main and three-vessel coronary arteriosclerosis is noted. Heart is enlarged without pericardial effusion. Mediastinum/Nodes: Confluent soft tissue mass within the mediastinum along the paratracheal, pre carinal and subcarinal portion of the mediastinum narrowing the lumen of the right mainstem bronchus as well as causing mass effect and luminal narrowing of the right pulmonary artery and branches to the right upper lobe. Additionally, there is marked luminal extrinsic impression on the SVC and azygos veins causing venous collaterals along the anterior right chest wall and mediastinum via intercostals in particular the left  superior intercostal vein via accessory hemiazygous. This mass is estimated at 6 x 5.5 x 5.8 cm. The thyroid, trachea and left mainstem bronchus are patent. Esophagus is not well characterized due to the adjacent mass. Lungs/Pleura: No effusion or pneumothorax. There is dependent atelectasis within both lower lobes and subsegmental atelectasis along the medial aspect of the right upper lobe. No postobstructive pneumonia. No dominant pulmonary mass. Mild hypoventilatory changes are believed to account for the bilateral ground-glass opacities noted. There are a few tiny subpleural nodular densities on the order of 3 mm or less bilaterally the upper lobes. Upper Abdomen: Bilateral adrenal nodules are noted on the left measuring 2.7 cm in maximum dimension and on the right 1.7 cm. Both demonstrate low Hounsfield units suggesting adenomas. Nonspecific hypervascular blush in the left hepatic lobe measuring 15 mm. Layering calculi are noted within the included gallbladder without wall thickening. Musculoskeletal: No aggressive nor suspicious osseous lesions. Review of the MIP images confirms the above findings. IMPRESSION: 1. 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 Additional luminal narrowing of the right main pulmonary artery and branches to the right upper lobe and luminal narrowing of the right mainstem bronchus. 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 Other consideration would include metastatic lymphadenopathy from other primary source of malignancy. 2. No acute pulmonary embolus. 3. Left main and three-vessel coronary arteriosclerosis. 4. 3 mm or less subpleural nodular densities are seen in the upper lobes bilaterally. No follow-up needed if  patient is low-risk (and has no known or suspected primary neoplasm). Non-contrast chest CT can be considered in 12 months if patient is high-risk. This recommendation follows the consensus statement: Guidelines for Management of Incidental Pulmonary Nodules Detected on CT Images: From the Fleischner Society 2017; Radiology 2017; 284:228-243. 5. Left 2.7 cm and right 1.7 cm adrenal nodules favoring adenomas given low Hounsfield units within both nodules. 6. Nonspecific hypervascular blush within the left hepatic lobe measuring 15 mm. Hypervascular hepatic lobe lesion versus vascular anomaly. 7. Uncomplicated cholelithiasis. Aortic Atherosclerosis (ICD10-I70.0). Electronically Signed   By: Ashley Royalty M.D.   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 All Related to #1 or ACEi or GERD or combination of the 3  - try off acei and on GERD rx pending fob  3) HBP on ACEi with upper airway wheeze and ? Underlying copd/ab Strongly prefer in this setting:  A   beta -1  selective Beta blocker   with bisoprolol the most selective generic choice  on the market.  And if need to add rx = diuretic/ amlodipine for now.   Discussed in detail all the  indications, usual  risks and alternatives  relative to the benefits with patient and daughter who agree  to proceed with w/u as outlined.     Total time devoted to counseling  > 50 % of initial 60 min consultation     Christinia Gully, MD Pulmonary and [place] [phone] After 5:30 PM or weekends, use Beeper [phone]

## 2017-02-02 DIAGNOSIS — R918 Other nonspecific abnormal finding of lung field: Secondary | ICD-10-CM

## 2017-02-02 MED ORDER — HYDRALAZINE HCL 10 MG PO TABS
10.0000 mg | ORAL_TABLET | Freq: Three times a day (TID) | ORAL | Status: DC
  Administered 2017-02-02 – 2017-02-09 (×21): 10 mg via ORAL
  Filled 2017-02-02 (×21): qty 1

## 2017-02-02 MED ORDER — TRAZODONE HCL 50 MG PO TABS
50.0000 mg | ORAL_TABLET | Freq: Once | ORAL | Status: AC
  Administered 2017-02-02: 50 mg via ORAL
  Filled 2017-02-02: qty 1

## 2017-02-02 NOTE — Progress Notes (Signed)
PROGRESS NOTE  Cassidy Sanders [id number]:[id number] DOB: 1940-10-31 DOA: 01/31/2017 PCP: Patient, No Pcp Per   LOS: 2 days   Brief Narrative / Interim history: The patient is a 76 year old female with history of depression, benign meningioma status post resection, hypertension who presented with chest congestion and shortness of breath for the last month.  She has noticed some is between expiratory wheezing coming from her chest but attributed to bronchitis or pneumonia because her husband has been sick with the same.  She presented to the emergency department because she could not get into see her PCP and she felt short of breath.  She has not been hypoxic and her blood pressures have been stable.  Pulmonology and oncology have been consulted.  Assessment & Plan: Active Problems:   SVC syndrome   Essential hypertension   Lung mass   Mediastinal mass   Mediastinal mass with SVC syndrome -Pulmonology consulted, will need an endobronchial ultrasound -Worsening respiratory status today requiring oxygen supplementation, patient with audible wheezing and she appreciates that her head and neck have been more swollen today -Bronch per pulm, likely next week, recommendations are to keep patient hospitalized at least until then. -We will need radonc consult on Monday -Continue steroids -CT of the abdomen and pelvis demonstrates no definite evidence of metastatic disease however she has bilateral low-attenuation adrenal nodules which are probably benign adenomas but this could also represent lung cancer spread to the adrenals which is common -She will need an outpatient PET scan  Essential hypertension -pressures elevated, patient has been taken off her ACE inhibitor and started on bisoprolol in place of her metoprolol per pulmonology in case this may also be contributing to her wheeze -Add hydralazine today for persistently high blood pressure  Depression  -stable, continue Zoloft  Ectatic  abdominal aorta -Will need repeat ultrasound in 5 years   DVT prophylaxis: SCD Code Status: Full code Family Communication: no family at bedside Disposition Plan: home after bronch  Consultants:   Pulmonary  Oncology   Procedures:   None   Antimicrobials:  None    Subjective: -Feels more short of breath today, barely able to ambulate to the bathroom, feels like her neck is more swollen today  Objective: Vitals:   02/01/17 2044 02/01/17 2152 02/02/17 0531 02/02/17 0758  BP:  (!) 142/76 (!) 180/85   Pulse:  90 73   Resp:  16 20   Temp:  97.8 F (36.6 C) 97.7 F (36.5 C)   TempSrc:  Oral Oral   SpO2: 95% 92% 94% 96%  Weight:      Height:        Intake/Output Summary (Last 24 hours) at 02/02/2017 1457 Last data filed at 02/02/2017 0531 Gross per 24 hour  Intake 475 ml  Output -  Net 475 ml   Filed Weights   01/31/17 1156 01/31/17 1854  Weight: 88.5 kg (195 lb) 88.4 kg (194 lb 14.2 oz)    Examination:  Constitutional: Audible stridor from the door, Eyes: lids and conjunctivae normal ENMT: Mucous membranes are moist.  Facial flush Neck: Thick neck, mildly swollen Respiratory: Bilateral wheezing present, increased respiratory effort, no crackles  Cardiovascular: Regular rate and rhythm, no murmurs / rubs / gallops. No LE edema. .  Abdomen: no tenderness. Bowel sounds positive.  Skin: no rashes Neurologic: non focal Psychiatric: Normal judgment and insight. Alert and oriented x 3. Normal mood.    Data Reviewed: I have independently reviewed following labs and imaging studies   CBC:  Recent Labs  Lab 01/31/17 1309 02/01/17 0409  WBC 11.3* 10.5  NEUTROABS 7.5  --   HGB 13.5 14.0  HCT 42.1 43.9  MCV 89.2 89.8  PLT 173 622   Basic Metabolic Panel: Recent Labs  Lab 01/31/17 1309 02/01/17 0409  NA 137 141  K 3.5 3.9  CL 102 106  CO2 28 25  GLUCOSE 74 145*  BUN 25* 21*  CREATININE 0.74 0.80  CALCIUM 9.2 9.4   GFR: Estimated Creatinine  Clearance: 68.7 mL/min (by C-G formula based on SCr of 0.8 mg/dL). Liver Function Tests: Recent Labs  Lab 01/31/17 1309 02/01/17 0409  AST 18 28  ALT 17 19  ALKPHOS 76 85  BILITOT 1.1 0.7  PROT 6.8 7.4  ALBUMIN 3.6 3.7   No results for input(s): LIPASE, AMYLASE in the last 168 hours. No results for input(s): AMMONIA in the last 168 hours. Coagulation Profile: No results for input(s): INR, PROTIME in the last 168 hours. Cardiac Enzymes: Recent Labs  Lab 01/31/17 1309  TROPONINI <0.03   BNP (last 3 results) No results for input(s): PROBNP in the last 8760 hours. HbA1C: No results for input(s): HGBA1C in the last 72 hours. CBG: No results for input(s): GLUCAP in the last 168 hours. Lipid Profile: No results for input(s): CHOL, HDL, LDLCALC, TRIG, CHOLHDL, LDLDIRECT in the last 72 hours. Thyroid Function Tests: No results for input(s): TSH, T4TOTAL, FREET4, T3FREE, THYROIDAB in the last 72 hours. Anemia Panel: No results for input(s): VITAMINB12, FOLATE, FERRITIN, TIBC, IRON, RETICCTPCT in the last 72 hours. Urine analysis: No results found for: COLORURINE, APPEARANCEUR, LABSPEC, PHURINE, GLUCOSEU, HGBUR, BILIRUBINUR, KETONESUR, PROTEINUR, UROBILINOGEN, NITRITE, LEUKOCYTESUR Sepsis Labs: Invalid input(s): PROCALCITONIN, LACTICIDVEN  No results found for this or any previous visit (from the past 240 hour(s)).    Radiology Studies: Ct Abdomen Pelvis W Contrast  Result Date: 02/01/2017 CLINICAL DATA:  76 year old female with a newly diagnosed right hilar mass. CT scan of the abdomen and pelvis ordered for staging. EXAM: CT ABDOMEN AND PELVIS WITH CONTRAST TECHNIQUE: Multidetector CT imaging of the abdomen and pelvis was performed using the standard protocol following bolus administration of intravenous contrast. CONTRAST:  116mL ISOVUE-300 IOPAMIDOL (ISOVUE-300) INJECTION 61% COMPARISON:  CT scan of the chest 01/31/2017 FINDINGS: Lower chest: Minimal dependent atelectasis. The  right hilar mass is not included in the field of view. The heart is normal in size. Coronary artery calcifications are present. No pericardial effusion. Unremarkable visualized distal thoracic esophagus. Hepatobiliary: Normal hepatic morphology and contours. No evidence of hepatic metastatic disease. The portal veins are patent. High attenuation material layers within the gallbladder lumen consistent with cholelithiasis. No biliary ductal dilatation. Pancreas: Unremarkable. No pancreatic ductal dilatation or surrounding inflammatory changes. Spleen: Normal in size without focal abnormality. Adrenals/Urinary Tract: 1.9 cm low-attenuation left adrenal nodule is favored to represent a benign adenoma. A 1.5 cm right adrenal nodule is also low in attenuation and favored to be benign in nature. No evidence of enhancing renal mass, hydronephrosis or nephrolithiasis. Tiny subcentimeter low-attenuation renal lesions are too small to characterize and statistically highly likely benign cysts. The ureters and bladder are unremarkable. Stomach/Bowel: No evidence of obstruction or focal bowel wall thickening. Normal appendix in the right lower quadrant. The terminal ileum is unremarkable. Vascular/Lymphatic: Scattered atherosclerotic vascular calcifications including along the length of the abdominal aorta. The infrarenal abdominal aorta is ectatic measuring up to 2.6 cm in diameter. No suspicious lymphadenopathy. Reproductive: Surgical changes of prior hysterectomy. Unremarkable adnexal without evidence of  mass. Other: No free fluid.  Small fat containing umbilical hernia. Musculoskeletal: No acute fracture or aggressive appearing lytic or blastic osseous lesion. Multilevel degenerative disc disease and facet arthropathy. Compression deformity of the superior endplate of S14 without focal lucency is favored to be chronic in nature. IMPRESSION: 1. No definite evidence of metastatic disease within the abdomen or pelvis. 2.  Bilateral low-attenuation adrenal nodules are favored to represent benign adenomas. However, attention is recommended on follow-up PET-CT imaging to assess for superimposed hypermetabolism which could indicate a collision tumor. 3. Ectatic abdominal aorta at risk for aneurysm development. Recommend followup by ultrasound in 5 years. This recommendation follows ACR consensus guidelines: White Paper of the ACR Incidental Findings Committee II on Vascular Findings. J Am Coll Radiol 2013; 23:953-202. 4.  Aortic Atherosclerosis (ICD10-170.0) 5. Multilevel degenerative disc disease and facet arthropathy. 6. T12 compression deformity with approximately 40% height loss anteriorly appears chronic but is age indeterminate without prior imaging for comparison. Electronically Signed   By: Jacqulynn Cadet M.D.   On: 02/01/2017 12:40     Scheduled Meds: . bisoprolol  5 mg Oral Daily  . ipratropium-albuterol  3 mL Nebulization TID  . pantoprazole  40 mg Oral BID AC  . sertraline  50 mg Oral Daily  . vitamin E  400 Units Oral Daily   Continuous Infusions: . sodium chloride 10 mL/hr at 02/02/17 0602    Marzetta Board, MD, PhD [facility] Pager [phone]  If 7PM-7AM, please contact night-coverage [email or web address] Password [facility] 02/02/2017, 2:57 PM

## 2017-02-02 NOTE — Progress Notes (Signed)
PULMONARY / CRITICAL CARE MEDICINE   Name: Cassidy Sanders MRN: [id number] DOB: [birth year]    ADMISSION DATE:  01/31/2017 CONSULTATION DATE:  02/01/17  REFERRING MD:  Sheliah Plane / Frederich Chick  CHIEF COMPLAINT:  Sob/wheeze  HISTORY OF PRESENT ILLNESS:   76 yowf quit smoking 4 y ago without apparent sequelae admitted 11/21 with progressive doe with audible wheeze x 1 month indolent onset progressive but still comfortable at rest and supine > to ER 01/31/17 with peritracheal mass with  SVC syndrome criteria but no clinical correlation and collaterals suggest this is long standing.  She did have one episode of epistaxis one month ago but none since.  She is losing wt but denies dyphagia/ anorexia/ fever/ sweats. Breathing no better with hfa saba - doe= MMRC3 = can't walk 100 yards even at a slow pace at a flat grade s stopping due to sob  - feels wheezing some better since admit and rx steoids/ note also on ACEi   No obvious day to day or daytime variability or assoc excess/ purulent sputum or mucus plugs or  cp or chest tightness,   overt sinus or hb symptoms. No unusual exp hx or h/o childhood pna/ asthma or knowledge of premature birth.  Sleeping ok flat/ prefers L side down without nocturnal  or early am exacerbation  of respiratory  c/o's or need for noct saba. Also denies any obvious fluctuation of symptoms with weather or environmental changes or other aggravating or alleviating factors except as outlined above     SUBJECTIVE: Pt and family anxious about diagnosis > want to have procedure as soon as possible.  She cares for her husband and he recently had a toe amputation.  States she has had increased swelling in her face / neck.  SOB with exertion. She feels stopping the lisinopril has already made a difference in her wheezing.    VITAL SIGNS: BP (!) 180/85 (BP Location: Left Arm)   Pulse 73   Temp 97.7 F (36.5 C) (Oral)   Resp 20   Ht 5\' 9"  (1.753 m)   Wt 194 lb 14.2 oz (88.4 kg)   SpO2  96%   BMI 28.78 kg/m   HEMODYNAMICS:    VENTILATOR SETTINGS:    INTAKE / OUTPUT: I/O last 3 completed shifts: In: 1274.5 [P.O.:1195; I.V.:79.5] Out: -   PHYSICAL EXAMINATION: General: elderly female sitting up in bed.  No acute distress.  Family at bedside.  HEENT: MM pink/moist, tolerating PO's, increased neck fullness, facial flushing, upper airway wheezing  PSY: calm/appropriate Neuro: AAOx4, speech clear, MAE  CV: s1s2 rrr, no m/r/g PULM: even/non-labored, lungs bilaterally with referred wheeze ZW:CHEN, non-tender, bsx4 active  Extremities: warm/dry, no peripheral edema  Skin: no rashes or lesions   LABS:  BMET Recent Labs  Lab 01/31/17 1309 02/01/17 0409  NA 137 141  K 3.5 3.9  CL 102 106  CO2 28 25  BUN 25* 21*  CREATININE 0.74 0.80  GLUCOSE 74 145*    Electrolytes Recent Labs  Lab 01/31/17 1309 02/01/17 0409  CALCIUM 9.2 9.4    CBC Recent Labs  Lab 01/31/17 1309 02/01/17 0409  WBC 11.3* 10.5  HGB 13.5 14.0  HCT 42.1 43.9  PLT 173 175    Coag's No results for input(s): APTT, INR in the last 168 hours.  Sepsis Markers No results for input(s): LATICACIDVEN, PROCALCITON, O2SATVEN in the last 168 hours.  ABG No results for input(s): PHART, PCO2ART, PO2ART in the last 168 hours.  Liver  Enzymes Recent Labs  Lab 01/31/17 1309 02/01/17 0409  AST 18 28  ALT 17 19  ALKPHOS 76 85  BILITOT 1.1 0.7  ALBUMIN 3.6 3.7    Cardiac Enzymes Recent Labs  Lab 01/31/17 1309  TROPONINI <0.03    Glucose No results for input(s): GLUCAP in the last 168 hours.  Imaging No results found.      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  CT evaluation demonstrated a large (6x5 x 5.5 x 5.8 cm) paratracheal mediastinal mass causing narrowing of the SVC and SVC syndrome that results in collaterals.  In addition, she also has small pulmonary  nodules (<74mm).  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 Lake Bells, family aware) Feel patient should stay inpatient until testing given new upper head/neck swelling and concern for airway compromise Continue empiric steroids ONC following, appreciate input   Upper airway wheeze - suspect secondary to mass but pt feels improved off ACE, +/- component GERD P: Monitor off ACE-I  GERD Rx   HBP on ACEi - with upper airway wheeze and ? Underlying copd/ab P: Continue bisoprolol  May need further control (diuretic + amlodipine)    PCCM will continue to follow.    Noe Gens, NP-C [facility] Pgr: [phone] or if no answer [phone] 02/02/2017, 1:11 PM

## 2017-02-03 MED ORDER — TRAZODONE HCL 50 MG PO TABS
50.0000 mg | ORAL_TABLET | Freq: Once | ORAL | Status: AC
  Administered 2017-02-03: 50 mg via ORAL
  Filled 2017-02-03: qty 1

## 2017-02-03 NOTE — Progress Notes (Signed)
Patient advised not to get in shower at this time due to patient c/o sob with exertion. Daughter assisted patient to the shower stating that she was a Marine scientist.

## 2017-02-03 NOTE — Progress Notes (Signed)
PROGRESS NOTE  Saveah Bahar WEX:[id number] DOB: 01/04/1941 DOA: 01/31/2017 PCP: Patient, No Pcp Per   LOS: 3 days   Brief Narrative / Interim history: The patient is a 76 year old female with history of depression, benign meningioma status post resection, hypertension who presented with chest congestion and shortness of breath for the last month.  She has noticed some is between expiratory wheezing coming from her chest but attributed to bronchitis or pneumonia because her husband has been sick with the same.  She presented to the emergency department because she could not get into see her PCP and she felt short of breath.  She has not been hypoxic and her blood pressures have been stable.  Pulmonology and oncology have been consulted.  Assessment & Plan: Active Problems:   SVC syndrome   Essential hypertension   Lung mass   Mediastinal mass   Mediastinal mass with SVC syndrome -Pulmonology consulted, will need an endobronchial ultrasound -Worsening respiratory status today requiring oxygen supplementation, patient with audible wheezing and she appreciates that her head and neck have been more swollen today -Bronch per pulm, likely next week, recommendations are to keep patient hospitalized at least until then. -We will need radonc consult on Monday -Continue steroids -CT of the abdomen and pelvis demonstrates no definite evidence of metastatic disease however she has bilateral low-attenuation adrenal nodules which are probably benign adenomas but this could also represent lung cancer spread to the adrenals which is common -She will need an outpatient PET scan  Essential hypertension -pressures elevated, patient has been taken off her ACE inhibitor and started on bisoprolol in place of her metoprolol per pulmonology in case this may also be contributing to her wheeze -BP stable after hydralazine  Depression  -stable, continue Zoloft  Ectatic abdominal aorta -Will need repeat  ultrasound in 5 years   DVT prophylaxis: SCD Code Status: Full code Family Communication: no family at bedside Disposition Plan: home after bronch  Consultants:   Pulmonary  Oncology   Procedures:   None   Antimicrobials:  None    Subjective: -shortness of breath stable, not worsened anymore.   Objective: Vitals:   02/02/17 2020 02/02/17 2022 02/03/17 0508 02/03/17 0849  BP: (!) 145/88  134/86   Pulse: 79  69   Resp: 16  14   Temp: 99 F (37.2 C)  97.9 F (36.6 C)   TempSrc: Oral  Oral   SpO2: 90% 94% 94% 97%  Weight:      Height:        Intake/Output Summary (Last 24 hours) at 02/03/2017 1256 Last data filed at 02/02/2017 1800 Gross per 24 hour  Intake 240 ml  Output -  Net 240 ml   Filed Weights   01/31/17 1156 01/31/17 1854  Weight: 88.5 kg (195 lb) 88.4 kg (194 lb 14.2 oz)    Examination:  Constitutional: Audible stridor Respiratory: wheezing, coarse breath sounds Cardiovascular: RRR  Data Reviewed: I have independently reviewed following labs and imaging studies   CBC: Recent Labs  Lab 01/31/17 1309 02/01/17 0409  WBC 11.3* 10.5  NEUTROABS 7.5  --   HGB 13.5 14.0  HCT 42.1 43.9  MCV 89.2 89.8  PLT 173 938   Basic Metabolic Panel: Recent Labs  Lab 01/31/17 1309 02/01/17 0409  NA 137 141  K 3.5 3.9  CL 102 106  CO2 28 25  GLUCOSE 74 145*  BUN 25* 21*  CREATININE 0.74 0.80  CALCIUM 9.2 9.4   GFR: Estimated Creatinine Clearance:  68.7 mL/min (by C-G formula based on SCr of 0.8 mg/dL). Liver Function Tests: Recent Labs  Lab 01/31/17 1309 02/01/17 0409  AST 18 28  ALT 17 19  ALKPHOS 76 85  BILITOT 1.1 0.7  PROT 6.8 7.4  ALBUMIN 3.6 3.7   No results for input(s): LIPASE, AMYLASE in the last 168 hours. No results for input(s): AMMONIA in the last 168 hours. Coagulation Profile: No results for input(s): INR, PROTIME in the last 168 hours. Cardiac Enzymes: Recent Labs  Lab 01/31/17 1309  TROPONINI <0.03   BNP  (last 3 results) No results for input(s): PROBNP in the last 8760 hours. HbA1C: No results for input(s): HGBA1C in the last 72 hours. CBG: No results for input(s): GLUCAP in the last 168 hours. Lipid Profile: No results for input(s): CHOL, HDL, LDLCALC, TRIG, CHOLHDL, LDLDIRECT in the last 72 hours. Thyroid Function Tests: No results for input(s): TSH, T4TOTAL, FREET4, T3FREE, THYROIDAB in the last 72 hours. Anemia Panel: No results for input(s): VITAMINB12, FOLATE, FERRITIN, TIBC, IRON, RETICCTPCT in the last 72 hours. Urine analysis: No results found for: COLORURINE, APPEARANCEUR, LABSPEC, PHURINE, GLUCOSEU, HGBUR, BILIRUBINUR, KETONESUR, PROTEINUR, UROBILINOGEN, NITRITE, LEUKOCYTESUR Sepsis Labs: Invalid input(s): PROCALCITONIN, LACTICIDVEN  No results found for this or any previous visit (from the past 240 hour(s)).    Radiology Studies: No results found.   Scheduled Meds: . bisoprolol  5 mg Oral Daily  . hydrALAZINE  10 mg Oral Q8H  . ipratropium-albuterol  3 mL Nebulization TID  . pantoprazole  40 mg Oral BID AC  . sertraline  50 mg Oral Daily  . vitamin E  400 Units Oral Daily   Continuous Infusions: . sodium chloride 10 mL/hr at 02/02/17 0602    Marzetta Board, MD, PhD [facility] Pager [phone]  If 7PM-7AM, please contact night-coverage [email or web address] Password [facility] 02/03/2017, 12:56 PM

## 2017-02-04 MED ORDER — TRAZODONE HCL 50 MG PO TABS
50.0000 mg | ORAL_TABLET | Freq: Every evening | ORAL | Status: DC | PRN
  Administered 2017-02-04 – 2017-02-11 (×7): 50 mg via ORAL
  Filled 2017-02-04 (×7): qty 1

## 2017-02-04 NOTE — Progress Notes (Signed)
PROGRESS NOTE  Cassidy Sanders HCW:[id number] DOB: 04/18/1940 DOA: 01/31/2017 PCP: Patient, No Pcp Per   LOS: 4 days   Brief Narrative / Interim history: The patient is a 76 year old female with history of depression, benign meningioma status post resection, hypertension who presented with chest congestion and shortness of breath for the last month.  She has noticed some is between expiratory wheezing coming from her chest but attributed to bronchitis or pneumonia because her husband has been sick with the same.  She presented to the emergency department because she could not get into see her PCP and she felt short of breath.  She has not been hypoxic and her blood pressures have been stable.  Pulmonology and oncology have been consulted.  Assessment & Plan: Active Problems:   SVC syndrome   Essential hypertension   Lung mass   Mediastinal mass   Mediastinal mass with SVC syndrome -Pulmonology consulted, will need an endobronchial ultrasound -Worsening respiratory status 11/23 requiring oxygen supplementation, patient with audible wheezing and she appreciates that her head and neck have been more swollen 11/23 -Bronch per pulm, likely next week, Monday or Tuesday, recommendations are to keep patient hospitalized at least until then. -We will need radonc consult on Monday -Continue steroids -CT of the abdomen and pelvis demonstrates no definite evidence of metastatic disease however she has bilateral low-attenuation adrenal nodules which are probably benign adenomas but this could also represent lung cancer spread to the adrenals which is common -She will need an outpatient PET scan  Probable COPD -50+-pack-year smoking, continue nebulizers  Essential hypertension -pressures elevated, patient has been taken off her ACE inhibitor and started on bisoprolol in place of her metoprolol per pulmonology in case this may also be contributing to her wheeze -BP stable after  hydralazine  Depression  -stable, continue Zoloft  Ectatic abdominal aorta -Will need repeat ultrasound in 5 years   DVT prophylaxis: SCD Code Status: Full code Family Communication: no family at bedside Disposition Plan: home after bronch  Consultants:   Pulmonary  Oncology   Procedures:   None   Antimicrobials:  None    Subjective: -She is doing well, shortness of breath is stable, continues to have stridor/wheezing.   Objective: Vitals:   02/03/17 1922 02/03/17 2100 02/04/17 0508 02/04/17 0718  BP:  (!) 153/91 (!) 158/95   Pulse:  86 75   Resp:  18 10   Temp:  97.6 F (36.4 C) 98 F (36.7 C)   TempSrc:  Oral Oral   SpO2: 96% 97% 95% 98%  Weight:      Height:        Intake/Output Summary (Last 24 hours) at 02/04/2017 1130 Last data filed at 02/04/2017 0900 Gross per 24 hour  Intake 720 ml  Output -  Net 720 ml   Filed Weights   01/31/17 1156 01/31/17 1854  Weight: 88.5 kg (195 lb) 88.4 kg (194 lb 14.2 oz)    Examination:  Constitutional: NAD Respiratory: Wheezing, coarse breath sounds bilaterally Cardiovascular: RRR  Data Reviewed: I have independently reviewed following labs and imaging studies   CBC: Recent Labs  Lab 01/31/17 1309 02/01/17 0409  WBC 11.3* 10.5  NEUTROABS 7.5  --   HGB 13.5 14.0  HCT 42.1 43.9  MCV 89.2 89.8  PLT 173 176   Basic Metabolic Panel: Recent Labs  Lab 01/31/17 1309 02/01/17 0409  NA 137 141  K 3.5 3.9  CL 102 106  CO2 28 25  GLUCOSE 74 145*  BUN 25* 21*  CREATININE 0.74 0.80  CALCIUM 9.2 9.4   GFR: Estimated Creatinine Clearance: 68.7 mL/min (by C-G formula based on SCr of 0.8 mg/dL). Liver Function Tests: Recent Labs  Lab 01/31/17 1309 02/01/17 0409  AST 18 28  ALT 17 19  ALKPHOS 76 85  BILITOT 1.1 0.7  PROT 6.8 7.4  ALBUMIN 3.6 3.7   No results for input(s): LIPASE, AMYLASE in the last 168 hours. No results for input(s): AMMONIA in the last 168 hours. Coagulation Profile: No  results for input(s): INR, PROTIME in the last 168 hours. Cardiac Enzymes: Recent Labs  Lab 01/31/17 1309  TROPONINI <0.03   BNP (last 3 results) No results for input(s): PROBNP in the last 8760 hours. HbA1C: No results for input(s): HGBA1C in the last 72 hours. CBG: No results for input(s): GLUCAP in the last 168 hours. Lipid Profile: No results for input(s): CHOL, HDL, LDLCALC, TRIG, CHOLHDL, LDLDIRECT in the last 72 hours. Thyroid Function Tests: No results for input(s): TSH, T4TOTAL, FREET4, T3FREE, THYROIDAB in the last 72 hours. Anemia Panel: No results for input(s): VITAMINB12, FOLATE, FERRITIN, TIBC, IRON, RETICCTPCT in the last 72 hours. Urine analysis: No results found for: COLORURINE, APPEARANCEUR, LABSPEC, PHURINE, GLUCOSEU, HGBUR, BILIRUBINUR, KETONESUR, PROTEINUR, UROBILINOGEN, NITRITE, LEUKOCYTESUR Sepsis Labs: Invalid input(s): PROCALCITONIN, LACTICIDVEN  No results found for this or any previous visit (from the past 240 hour(s)).    Radiology Studies: No results found.   Scheduled Meds: . bisoprolol  5 mg Oral Daily  . hydrALAZINE  10 mg Oral Q8H  . ipratropium-albuterol  3 mL Nebulization TID  . pantoprazole  40 mg Oral BID AC  . sertraline  50 mg Oral Daily  . vitamin E  400 Units Oral Daily   Continuous Infusions: . sodium chloride 10 mL/hr at 02/02/17 0602    Marzetta Board, MD, PhD [facility] Pager [phone]  If 7PM-7AM, please contact night-coverage [email or web address] Password [email or web address] 02/04/2017, 11:30 AM

## 2017-02-05 ENCOUNTER — Inpatient Hospital Stay (HOSPITAL_COMMUNITY): Payer: Medicare Other

## 2017-02-05 DIAGNOSIS — R042 Hemoptysis: Secondary | ICD-10-CM

## 2017-02-05 DIAGNOSIS — R0602 Shortness of breath: Secondary | ICD-10-CM

## 2017-02-05 LAB — BASIC METABOLIC PANEL
Anion gap: 5 (ref 5–15)
BUN: 17 mg/dL (ref 6–20)
CALCIUM: 8.8 mg/dL — AB (ref 8.9–10.3)
CO2: 28 mmol/L (ref 22–32)
CREATININE: 0.78 mg/dL (ref 0.44–1.00)
Chloride: 108 mmol/L (ref 101–111)
GFR calc non Af Amer: >60 mL/min (ref >60)
GLUCOSE: 152 mg/dL — AB (ref 65–99)
Potassium: 4 mmol/L (ref 3.5–5.1)
Sodium: 141 mmol/L (ref 135–145)

## 2017-02-05 LAB — GLUCOSE, CAPILLARY
GLUCOSE-CAPILLARY: 103 mg/dL — AB (ref 65–99)
Glucose-Capillary: 136 mg/dL — ABNORMAL HIGH (ref 65–99)

## 2017-02-05 LAB — ABO/RH: ABO/RH(D): O POS

## 2017-02-05 LAB — CBC
HCT: 40.2 % (ref 36.0–46.0)
Hemoglobin: 12.7 g/dL (ref 12.0–15.0)
MCH: 29.3 pg (ref 26.0–34.0)
MCHC: 31.6 g/dL (ref 30.0–36.0)
MCV: 92.8 fL (ref 78.0–100.0)
PLATELETS: 150 10*3/uL (ref 150–400)
RBC: 4.33 MIL/uL (ref 3.87–5.11)
RDW: 15 % (ref 11.5–15.5)
WBC: 10.9 10*3/uL — ABNORMAL HIGH (ref 4.0–10.5)

## 2017-02-05 LAB — PROTIME-INR
INR: 0.99
PROTHROMBIN TIME: 13 s (ref 11.4–15.2)

## 2017-02-05 LAB — TYPE AND SCREEN
ABO/RH(D): O POS
Antibody Screen: NEGATIVE

## 2017-02-05 LAB — APTT: APTT: 25 s (ref 24–36)

## 2017-02-05 LAB — MRSA PCR SCREENING: MRSA by PCR: NEGATIVE

## 2017-02-05 MED ORDER — DEXAMETHASONE SODIUM PHOSPHATE 4 MG/ML IJ SOLN
8.0000 mg | Freq: Three times a day (TID) | INTRAMUSCULAR | Status: DC
  Administered 2017-02-05 – 2017-02-08 (×9): 8 mg via INTRAVENOUS
  Filled 2017-02-05 (×9): qty 2

## 2017-02-05 MED ORDER — FUROSEMIDE 10 MG/ML IJ SOLN
20.0000 mg | Freq: Once | INTRAMUSCULAR | Status: AC
  Administered 2017-02-05: 20 mg via INTRAVENOUS
  Filled 2017-02-05: qty 2

## 2017-02-05 MED ORDER — DEXAMETHASONE SODIUM PHOSPHATE 10 MG/ML IJ SOLN: 20.0000 mg | Freq: Once | INTRAMUSCULAR | Status: DC

## 2017-02-05 MED ORDER — IPRATROPIUM-ALBUTEROL 0.5-2.5 (3) MG/3ML IN SOLN
3.0000 mL | Freq: Once | RESPIRATORY_TRACT | Status: AC
  Administered 2017-02-05: 3 mL via RESPIRATORY_TRACT

## 2017-02-05 MED ORDER — SALINE SPRAY 0.65 % NA SOLN
1.0000 | NASAL | Status: DC | PRN
  Filled 2017-02-05: qty 44

## 2017-02-05 MED ORDER — INSULIN ASPART 100 UNIT/ML ~~LOC~~ SOLN
0.0000 [IU] | Freq: Three times a day (TID) | SUBCUTANEOUS | Status: DC
  Administered 2017-02-05 – 2017-02-06 (×3): 1 [IU] via SUBCUTANEOUS

## 2017-02-05 MED ORDER — METHYLPREDNISOLONE SODIUM SUCC 40 MG IJ SOLR
40.0000 mg | Freq: Once | INTRAMUSCULAR | Status: AC
  Administered 2017-02-05: 40 mg via INTRAVENOUS
  Filled 2017-02-05: qty 1

## 2017-02-05 NOTE — Progress Notes (Signed)
PROGRESS NOTE    Cassidy Sanders  WIO:[id number] DOB: January 13, 1941 DOA: 01/31/2017 PCP: Patient, No Pcp Per   Brief Narrative:  The patient is Cassidy Sanders 76 year old female with history of depression, benign meningioma status post resection, hypertension who presented with chest congestion and shortness of breath for the last month. She has noticed some is between expiratory wheezing coming from her chest but attributed to bronchitis or pneumonia because her husband has been sick with the same. She presented to the emergency department because she could not get into see her PCP and she felt short of breath. She has not been hypoxic and her blood pressures have been stable. Pulmonology and oncology have been consulted.  On 11/25-26 pt with increasing hypoxia and hemoptysis.    Assessment & Plan:   Active Problems:   SVC syndrome   Essential hypertension   Lung mass   Mediastinal mass   Hemoptysis: RRT called for hemoptysis with worsening hypoxia this morning.  She'd had CXR for increased O2 sats last night that was negative for edema/infiltrate.  Currently on 5 L by [place] with decreased hemoptysis at the moment, but will repeat CXR, transfer to SDU, type and screen.  Will discuss with pulmonology.     Mediastinal masswith SVC syndrome -Pulmonology consulted, will need an endobronchial ultrasound.  Plan for 11/27. -Worsening respiratory status 11/23 requiring oxygen supplementation, patient with audible wheezing and she appreciates that her head and neck have been more swollen 11/23 -Bronch per pulm, likely next week, Monday or Tuesday, recommendations are to keep patient hospitalized at least until then. -We will need radonc consult as well -Continue steroids (discussed with pulm today as these look to have been discontinued) -CT of the abdomen and pelvis demonstrates no definite evidence of metastatic disease however she has bilateral low-attenuation adrenal nodules which are probably benign  adenomas but this could also represent lung cancer spread to the adrenals which is common -She will need an outpatient PET scan  Probable COPD -50+-pack-year smoking, continue nebulizers  Essential hypertension -pressures elevated, patient has been taken off her ACE inhibitor and started on bisoprolol in place of her metoprolol per pulmonology in case this may also be contributing to her wheeze -BP stable after hydralazine  Depression  -stable, continue Zoloft  Ectatic abdominal aorta -Will need repeat ultrasound in 5 years  DVT prophylaxis: SCD Code Status: full  Family Communication: none at bedside Disposition Plan: pending further w/u, clinical improvement   Consultants:   Oncology, Pulmonology  Procedures: (Don't include imaging studies which can be auto populated. Include things that cannot be auto populated i.e. Echo, Carotid and venous dopplers, Foley, Bipap, HD, tubes/drains, wound vac, central lines etc)  none  Antimicrobials: (specify start and planned stop date. Auto populated tables are space occupying and do not give end dates)   Subjective: Has been coughing up handfuls of blood since around 6 am. Things back to normal now. She notes increased SOB last night that started around 1 am then this morning had the hemoptysis.  No chest pain. This has been on/off since she's had the lesion, but this is the worst it's been. Denies LH or dizziness.   Objective: Vitals:   02/04/17 2158 02/05/17 0101 02/05/17 0128 02/05/17 0536  BP: (!) 148/78   (!) 169/97  Pulse: 87   88  Resp: (!) 119   (!) 98  Temp: 98.1 F (36.7 C)   97.7 F (36.5 C)  TempSrc: Oral   Oral  SpO2: 98% (!) 87%  90% 93%  Weight:      Height:        Intake/Output Summary (Last 24 hours) at 02/05/2017 0720 Last data filed at 02/05/2017 0537 Gross per 24 hour  Intake 1080 ml  Output -  Net 1080 ml   Filed Weights   01/31/17 1156 01/31/17 1854  Weight: 88.5 kg (195 lb) 88.4 kg (194  lb 14.2 oz)    Examination:  General exam: Appears calm and comfortable  Respiratory system: Diffuse wheezing and coarse breath sounds Cardiovascular system: S1 & S2 heard, RRR. No JVD, murmurs, rubs, gallops or clicks. No pedal edema. Gastrointestinal system: Abdomen is nondistended, soft and nontender. No organomegaly or masses felt. Normal bowel sounds heard. Central nervous system: Alert and oriented. No focal neurological deficits. Extremities: Symmetric 5 x 5 power. Skin: No rashes, lesions or ulcers Psychiatry: Judgement and insight appear normal. Mood & affect appropriate.     Data Reviewed: I have personally reviewed following labs and imaging studies  CBC: Recent Labs  Lab 01/31/17 1309 02/01/17 0409 02/05/17 0416  WBC 11.3* 10.5 10.9*  NEUTROABS 7.5  --   --   HGB 13.5 14.0 12.7  HCT 42.1 43.9 40.2  MCV 89.2 89.8 92.8  PLT 173 175 696   Basic Metabolic Panel: Recent Labs  Lab 01/31/17 1309 02/01/17 0409 02/05/17 0416  NA 137 141 141  K 3.5 3.9 4.0  CL 102 106 108  CO2 28 25 28   GLUCOSE 74 145* 152*  BUN 25* 21* 17  CREATININE 0.74 0.80 0.78  CALCIUM 9.2 9.4 8.8*   GFR: Estimated Creatinine Clearance: 68.7 mL/min (by C-G formula based on SCr of 0.78 mg/dL). Liver Function Tests: Recent Labs  Lab 01/31/17 1309 02/01/17 0409  AST 18 28  ALT 17 19  ALKPHOS 76 85  BILITOT 1.1 0.7  PROT 6.8 7.4  ALBUMIN 3.6 3.7   No results for input(s): LIPASE, AMYLASE in the last 168 hours. No results for input(s): AMMONIA in the last 168 hours. Coagulation Profile: No results for input(s): INR, PROTIME in the last 168 hours. Cardiac Enzymes: Recent Labs  Lab 01/31/17 1309  TROPONINI <0.03   BNP (last 3 results) No results for input(s): PROBNP in the last 8760 hours. HbA1C: No results for input(s): HGBA1C in the last 72 hours. CBG: No results for input(s): GLUCAP in the last 168 hours. Lipid Profile: No results for input(s): CHOL, HDL, LDLCALC, TRIG,  CHOLHDL, LDLDIRECT in the last 72 hours. Thyroid Function Tests: No results for input(s): TSH, T4TOTAL, FREET4, T3FREE, THYROIDAB in the last 72 hours. Anemia Panel: No results for input(s): VITAMINB12, FOLATE, FERRITIN, TIBC, IRON, RETICCTPCT in the last 72 hours. Sepsis Labs: No results for input(s): PROCALCITON, LATICACIDVEN in the last 168 hours.  No results found for this or any previous visit (from the past 240 hour(s)).       Radiology Studies: Dg Chest Port 1 View  Result Date: 02/05/2017 CLINICAL DATA:  Shortness of breath EXAM: PORTABLE CHEST 1 VIEW COMPARISON:  01/03/2017, 01/31/2017 FINDINGS: Borderline cardiomegaly. No consolidation or effusion. Negative for pneumothorax. Aortic atherosclerosis. Decreased right perihilar and paramediastinal opacity IMPRESSION: 1. Negative for edema or infiltrate 2. Interval decrease in right paratracheal mass Electronically Signed   By: Donavan Foil M.D.   On: 02/05/2017 01:42        Scheduled Meds: . bisoprolol  5 mg Oral Daily  . hydrALAZINE  10 mg Oral Q8H  . ipratropium-albuterol  3 mL Nebulization TID  .  pantoprazole  40 mg Oral BID AC  . sertraline  50 mg Oral Daily  . vitamin E  400 Units Oral Daily   Continuous Infusions: . sodium chloride 10 mL/hr at 02/02/17 0602     LOS: 5 days    Time spent: over 30 minutes    Fayrene Helper, MD [facility] Pager [phone]  If 7PM-7AM, please contact night-coverage [email or web address] Password [email or web address] 02/05/2017, 7:20 AM

## 2017-02-05 NOTE — Progress Notes (Addendum)
Pt called out, coughing and SOB with audible wheezing (worse than it had been) and difficulty talking. O2 sat 88-90% on 3L, whereas it had been running mid 90s. Called RT for prn neb tx; O2 sat still around 89-90% post tx. Paged on call NP and received orders for CXR, repeat neb, solumedrol. Pt sounding better and states she feels better after 2nd neb tx, but sats remain 89-90%. Placed on venti mask per NP recommendation since pts nose is stopped up. Continuing to monitor. Hortencia Conradi RN

## 2017-02-05 NOTE — Progress Notes (Addendum)
PULMONARY / CRITICAL CARE MEDICINE   Name: Cassidy Sanders MRN: [id number] DOB: 03-18-1940    ADMISSION DATE:  01/31/2017 CONSULTATION DATE:  02/01/17  REFERRING MD:  Sheliah Plane / Frederich Chick  CHIEF COMPLAINT:  Sob/wheeze  HISTORY OF PRESENT ILLNESS:   76 yowf quit smoking 4 y ago without apparent sequelae admitted 11/21 with progressive doe with audible wheeze x 1 month indolent onset progressive but still comfortable at rest and supine > to ER 01/31/17 with peritracheal mass with  SVC syndrome criteria but no clinical correlation and collaterals suggest this is long standing.  She did have one episode of epistaxis one month ago but none since.  She is losing wt but denies dyphagia/ anorexia/ fever/ sweats. Breathing no better with hfa saba - doe= MMRC3 = can't walk 100 yards even at a slow pace at a flat grade s stopping due to sob  - feels wheezing some better since admit and rx steoids/ note also on ACEi   No obvious day to day or daytime variability or assoc excess/ purulent sputum or mucus plugs or  cp or chest tightness,   overt sinus or hb symptoms. No unusual exp hx or h/o childhood pna/ asthma or knowledge of premature birth.  Sleeping ok flat/ prefers L side down without nocturnal  or early am exacerbation  of respiratory  c/o's or need for noct saba. Also denies any obvious fluctuation of symptoms with weather or environmental changes or other aggravating or alleviating factors except as outlined above     SUBJECTIVE:  Pt denies acute complaints.  Planned EBUS in am.  Patient anxious to get results.  Hemoptysis overnight, none since.      VITAL SIGNS: BP (!) 165/79   Pulse 88   Temp (!) 97.5 F (36.4 C) (Oral)   Resp 18   Ht 5\' 9"  (1.753 m)   Wt 194 lb 14.2 oz (88.4 kg)   SpO2 92%   BMI 28.78 kg/m   HEMODYNAMICS:    VENTILATOR SETTINGS:    INTAKE / OUTPUT: I/O last 3 completed shifts: In: 1080 [P.O.:1080] Out: -   PHYSICAL EXAMINATION: General:  Well developed elderly  female in NAD HEENT: MM pink/moist, no jvd PSY: calm/appropriate  Neuro: AAOx4, speech clear, MAE  CV: s1s2 rrr, no m/r/g PULM: even/non-labored, lungs bilaterally with referred wheeze  GI: soft, non-tender, bsx4 active  Extremities: warm/dry, no edema  Skin: no rashes or lesions  LABS:  BMET Recent Labs  Lab 01/31/17 1309 02/01/17 0409 02/05/17 0416  NA 137 141 141  K 3.5 3.9 4.0  CL 102 106 108  CO2 28 25 28   BUN 25* 21* 17  CREATININE 0.74 0.80 0.78  GLUCOSE 74 145* 152*    Electrolytes Recent Labs  Lab 01/31/17 1309 02/01/17 0409 02/05/17 0416  CALCIUM 9.2 9.4 8.8*    CBC Recent Labs  Lab 01/31/17 1309 02/01/17 0409 02/05/17 0416  WBC 11.3* 10.5 10.9*  HGB 13.5 14.0 12.7  HCT 42.1 43.9 40.2  PLT 173 175 150    Coag's No results for input(s): APTT, INR in the last 168 hours.  Sepsis Markers No results for input(s): LATICACIDVEN, PROCALCITON, O2SATVEN in the last 168 hours.  ABG No results for input(s): PHART, PCO2ART, PO2ART in the last 168 hours.  Liver Enzymes Recent Labs  Lab 01/31/17 1309 02/01/17 0409  AST 18 28  ALT 17 19  ALKPHOS 76 85  BILITOT 1.1 0.7  ALBUMIN 3.6 3.7    Cardiac Enzymes Recent Labs  Lab 01/31/17 1309  TROPONINI <0.03    Glucose No results for input(s): GLUCAP in the last 168 hours.  Imaging Dg Chest 1 View  Result Date: 02/05/2017 CLINICAL DATA:  Hemoptysis EXAM: CHEST 1 VIEW COMPARISON:  02/05/2017 FINDINGS: Airspace disease noted in the medial right upper lobe corresponding to the right paratracheal/suprahilar mass seen in this area on prior chest CT. Probable postobstructive process in the medial right upper lobe. No confluent opacity on the left. Heart is normal size. No effusions or acute bony abnormality. IMPRESSION: Right suprahilar/paratracheal mass with medial right upper lobe airspace opacity, likely postobstructive atelectasis or pneumonia. Electronically Signed   By: Rolm Baptise M.D.   On:  02/05/2017 08:46   Dg Chest Port 1 View  Result Date: 02/05/2017 CLINICAL DATA:  Shortness of breath EXAM: PORTABLE CHEST 1 VIEW COMPARISON:  01/03/2017, 01/31/2017 FINDINGS: Borderline cardiomegaly. No consolidation or effusion. Negative for pneumothorax. Aortic atherosclerosis. Decreased right perihilar and paramediastinal opacity IMPRESSION: 1. Negative for edema or infiltrate 2. Interval decrease in right paratracheal mass Electronically Signed   By: Donavan Foil M.D.   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  CT evaluation demonstrated a large (6x5 x 5.5 x 5.8 cm) paratracheal mediastinal mass causing narrowing of the SVC and SVC syndrome that results in collaterals.  In addition, she also has small pulmonary nodules (<30mm).  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 Lake Bells) Hold all anticoagulants  Decadron 8mg  IV Q8  ONC following, appreciate input   Upper airway wheeze - suspect secondary to mass but pt feels improved off ACE, +/- component GERD P: GERD Rx Monitor off ACE-I  Hemoptysis secondary to Mediastinal Mass P: Monitor volume, Hgb trend   HBP on ACEi - with upper airway wheeze and ? Underlying copd/ab P: Bisoprolol  Hydralazine  Tele monitoring   PCCM will continue to follow.  Anticipate EBUS pathology in am.  Noe Gens, NP-C [facility] Pgr: [phone] or if no answer [phone] 02/05/2017, 10:35 AM  STAFF NOTE: I, Merrie Roof, MD FACP have personally reviewed patient's available data, including medical history, events of note, physical examination and test results as part of my evaluation. I have discussed with resident/NP and other care providers such as pharmacist, RN and RRT. In addition, I  personally evaluated patient and elicited key findings of: awake, no distress, slight upper airway sounds, coarse BS, jvd, abdo soft, min edema, pcxr which I reviewed shows rt hilar upper mediastinal mass and int changes also noted on pcxr on admission, CT chest I reviewed shows paratracheal mass with SVC syndrome compression with JVD and mild base int hazziness, plan is for EBUS and bx in am, had hemoptysis overnight, would add decadron for now, may help reduce bleeding and edema mass compression, may benefit lasix to neg balance 1 liter next 24 hours, repeat chem in am, will obtain coags pre Bronch today, , hydral for HTn control, I updated pt in full, with steroids, add cbg, avoid cough  Lavon Paganini. Titus Mould, MD, [place] Pgr: [facility] 02/05/2017 10:41 AM

## 2017-02-05 NOTE — Progress Notes (Signed)
Rapid Response Event Note  Overview:  RRT called to room 1336, pt coughing up blood. Supplemental oxygen increased to 5L [place], pt oxygen saturation 94%.     Initial Focused Assessment: Pt coughing up pink tinged sputum upon my arrival. Pt states that she coughed up bloody tissue, and then subsequently began coughing up blood. After spending some time in the room, pt began coughing up small blood clots again. VSS. Pt BP 155/91, HR 88, 92% on 5L [place], RR 34. Per AM labs, HGB 12.7.  AM CXR completed. Wheezes heard bilaterally.  Interventions/ Event Summary:  -RRT at bedside, performing assessment. Feels PRN breathing treatment would not be beneficial at this time. -Pt HOB elevated to 45 degrees. -NP notified regarding pt VSS and symptoms, as listed above. NP denied repeat CXR, requested pt to be transferred to telemetry. -After speaking with NP, pt began coughing up blood clots again, sounding more rhonchus bilaterally. I paged Dr. Florene Glen for day shift, regarding if pt should transfer to telemetry or to SDU. Pt maintaining airway, has some increased WOB with coughing spelling producing tissue, bloody sputum, and blood clots. Once secretions clear pt WOB decreased, oxygen saturations now teetering between 88-90%. -Dr. Florene Glen at bedside, requesting pt be transferred to SDU.   Plan of Care (if not transferred):   Casimer Bilis

## 2017-02-05 NOTE — Progress Notes (Signed)
Please see RRRN note for further details: At 0500, transitioned pt from venti mask to [place] 5L to maintain sats >90%. Pt had no wheezing or SOB at this time and had been sleeping. At 0545, pt called stating she was coughing up blood. Observed pt coughing up several handfuls of bright red blood onto tissues. Pt maintained sats around 89-92% during the coughing spell. Called rapid response RN and RT to assess pt. Bleeding eventually slowed to a pink tinged small amount, but then turned bright red in heavier amounts again. MD in to assess pt at 0700 and pt transferred to stepdown for closer monitoring. Hortencia Conradi RN

## 2017-02-05 NOTE — Progress Notes (Signed)
   02/05/17 1700 02/05/17 1730  Vitals  BP (!) 178/98 (!) 187/103  MAP (mmHg) 127 129  ECG Heart Rate 84 86   Dr. Florene Glen informed of blood pressure. Physician wants to continue to monitor blood pressure for now.  No new orders given.

## 2017-02-05 NOTE — Progress Notes (Signed)
Plan for EBUS in am 11/27 0730 per Dr. Lake Bells.  Hold all anticoagulants 11/26, 11/27.  NPO after MN.  Pre-bronch orders placed.  Please call with any questions.     Noe Gens, NP-C [facility] Pgr: [phone] or if no answer [phone] 02/05/2017, 9:17 AM

## 2017-02-05 NOTE — Progress Notes (Signed)
Called to room and noted that patient has desaturations of 87-88% with audible wheezing.  PRN Albuterol given with slight relief but no increase in saturations above 90%.  MD called by RN and new orders obtained and followed out.  Additional tx with DUO given and saturations increased to 94 with Venti mask at 50% placed.  Decrease in wheezing noted.  Will continue to monitor if additional interventions required.

## 2017-02-06 ENCOUNTER — Inpatient Hospital Stay (HOSPITAL_COMMUNITY): Payer: Medicare Other | Admitting: Certified Registered Nurse Anesthetist

## 2017-02-06 ENCOUNTER — Encounter (HOSPITAL_COMMUNITY): Payer: Self-pay

## 2017-02-06 ENCOUNTER — Inpatient Hospital Stay (HOSPITAL_COMMUNITY): Payer: Medicare Other

## 2017-02-06 ENCOUNTER — Telehealth: Payer: Self-pay | Admitting: *Deleted

## 2017-02-06 ENCOUNTER — Encounter (HOSPITAL_COMMUNITY)
Admission: EM | Disposition: A | Discharge status: Home / Self Care | Payer: Self-pay | Source: Home / Self Care | Attending: Internal Medicine

## 2017-02-06 ENCOUNTER — Inpatient Hospital Stay
Admit: 2017-02-06 | Discharge: 2017-02-06 | Disposition: A | Discharge status: Home / Self Care | Payer: Medicare Other | Attending: Radiation Oncology | Admitting: Radiation Oncology

## 2017-02-06 ENCOUNTER — Inpatient Hospital Stay
Admit: 2017-02-06 | Discharge: 2017-02-06 | Disposition: A | Discharge status: Home / Self Care | Payer: Self-pay | Attending: Radiation Oncology | Admitting: Radiation Oncology

## 2017-02-06 DIAGNOSIS — J9859 Other diseases of mediastinum, not elsewhere classified: Secondary | ICD-10-CM

## 2017-02-06 DIAGNOSIS — C382 Malignant neoplasm of posterior mediastinum: Secondary | ICD-10-CM

## 2017-02-06 DIAGNOSIS — I871 Compression of vein: Secondary | ICD-10-CM

## 2017-02-06 DIAGNOSIS — R0602 Shortness of breath: Secondary | ICD-10-CM

## 2017-02-06 HISTORY — PX: ENDOBRONCHIAL ULTRASOUND: SHX5096

## 2017-02-06 LAB — BLOOD GAS, ARTERIAL
Acid-Base Excess: 1.7 mmol/L (ref 0.0–2.0)
Bicarbonate: 28.2 mmol/L — ABNORMAL HIGH (ref 20.0–28.0)
DRAWN BY: 270211
FIO2: 100
O2 SAT: 97.9 %
PATIENT TEMPERATURE: 98.6
PCO2 ART: 54.9 mmHg — AB (ref 32.0–48.0)
PEEP: 5 cmH2O
PH ART: 7.33 — AB (ref 7.350–7.450)
PO2 ART: 119 mmHg — AB (ref 83.0–108.0)
RATE: 14 resp/min
VT: 530 mL

## 2017-02-06 LAB — BASIC METABOLIC PANEL
ANION GAP: 7 (ref 5–15)
BUN: 22 mg/dL — ABNORMAL HIGH (ref 6–20)
CO2: 28 mmol/L (ref 22–32)
Calcium: 9.4 mg/dL (ref 8.9–10.3)
Chloride: 107 mmol/L (ref 101–111)
Creatinine, Ser: 0.69 mg/dL (ref 0.44–1.00)
GFR calc Af Amer: >60 mL/min (ref >60)
GFR calc non Af Amer: >60 mL/min (ref >60)
GLUCOSE: 149 mg/dL — AB (ref 65–99)
POTASSIUM: 3.6 mmol/L (ref 3.5–5.1)
Sodium: 142 mmol/L (ref 135–145)

## 2017-02-06 LAB — CBC
HEMATOCRIT: 40.7 % (ref 36.0–46.0)
Hemoglobin: 13 g/dL (ref 12.0–15.0)
MCH: 28.8 pg (ref 26.0–34.0)
MCHC: 31.9 g/dL (ref 30.0–36.0)
MCV: 90.2 fL (ref 78.0–100.0)
PLATELETS: 160 10*3/uL (ref 150–400)
RBC: 4.51 MIL/uL (ref 3.87–5.11)
RDW: 14.5 % (ref 11.5–15.5)
WBC: 9.6 10*3/uL (ref 4.0–10.5)

## 2017-02-06 LAB — GLUCOSE, CAPILLARY
GLUCOSE-CAPILLARY: 131 mg/dL — AB (ref 65–99)
Glucose-Capillary: 122 mg/dL — ABNORMAL HIGH (ref 65–99)
Glucose-Capillary: 129 mg/dL — ABNORMAL HIGH (ref 65–99)
Glucose-Capillary: 129 mg/dL — ABNORMAL HIGH (ref 65–99)
Glucose-Capillary: 138 mg/dL — ABNORMAL HIGH (ref 65–99)

## 2017-02-06 LAB — TRIGLYCERIDES: Triglycerides: 111 mg/dL (ref <150)

## 2017-02-06 LAB — MAGNESIUM
MAGNESIUM: 2.3 mg/dL (ref 1.7–2.4)
MAGNESIUM: 2.2 mg/dL (ref 1.7–2.4)
MAGNESIUM: 2.4 mg/dL (ref 1.7–2.4)

## 2017-02-06 LAB — TROPONIN I

## 2017-02-06 LAB — PHOSPHORUS
Phosphorus: 3.3 mg/dL (ref 2.5–4.6)
Phosphorus: 4.5 mg/dL (ref 2.5–4.6)
Phosphorus: 4.6 mg/dL (ref 2.5–4.6)

## 2017-02-06 SURGERY — ENDOBRONCHIAL ULTRASOUND (EBUS)
Anesthesia: General | Laterality: Bilateral

## 2017-02-06 MED ORDER — LIDOCAINE 2% (20 MG/ML) 5 ML SYRINGE
INTRAMUSCULAR | Status: AC
  Filled 2017-02-06: qty 5

## 2017-02-06 MED ORDER — PHENYLEPHRINE 40 MCG/ML (10ML) SYRINGE FOR IV PUSH (FOR BLOOD PRESSURE SUPPORT)
PREFILLED_SYRINGE | INTRAVENOUS | Status: AC
  Filled 2017-02-06: qty 10

## 2017-02-06 MED ORDER — PHENYLEPHRINE 40 MCG/ML (10ML) SYRINGE FOR IV PUSH (FOR BLOOD PRESSURE SUPPORT)
PREFILLED_SYRINGE | INTRAVENOUS | Status: DC | PRN
  Administered 2017-02-06: 80 ug via INTRAVENOUS
  Administered 2017-02-06: 120 ug via INTRAVENOUS
  Administered 2017-02-06: 80 ug via INTRAVENOUS
  Administered 2017-02-06: 120 ug via INTRAVENOUS

## 2017-02-06 MED ORDER — MIDAZOLAM HCL 5 MG/5ML IJ SOLN
INTRAMUSCULAR | Status: DC | PRN
  Administered 2017-02-06: 1 mg via INTRAVENOUS

## 2017-02-06 MED ORDER — LACTATED RINGERS IV SOLN
INTRAVENOUS | Status: DC | PRN
  Administered 2017-02-06: 08:00:00 via INTRAVENOUS

## 2017-02-06 MED ORDER — MIDAZOLAM HCL 2 MG/2ML IJ SOLN
INTRAMUSCULAR | Status: AC
  Filled 2017-02-06: qty 2

## 2017-02-06 MED ORDER — KETAMINE HCL 10 MG/ML IJ SOLN
INTRAMUSCULAR | Status: AC
  Filled 2017-02-06: qty 1

## 2017-02-06 MED ORDER — KETAMINE HCL 10 MG/ML IJ SOLN
INTRAMUSCULAR | Status: DC | PRN
  Administered 2017-02-06 (×2): 50 mg via INTRAVENOUS

## 2017-02-06 MED ORDER — VITAL HIGH PROTEIN PO LIQD
1000.0000 mL | ORAL | Status: DC
  Filled 2017-02-06: qty 1000

## 2017-02-06 MED ORDER — ONDANSETRON HCL 4 MG/2ML IJ SOLN
INTRAMUSCULAR | Status: DC | PRN
  Administered 2017-02-06: 4 mg via INTRAVENOUS

## 2017-02-06 MED ORDER — EPINEPHRINE PF 1 MG/10ML IJ SOSY
PREFILLED_SYRINGE | INTRAMUSCULAR | Status: DC | PRN
  Administered 2017-02-06: 20 ug via INTRAVENOUS

## 2017-02-06 MED ORDER — LACTATED RINGERS IV SOLN
INTRAVENOUS | Status: DC | PRN
  Administered 2017-02-06: 07:00:00 via INTRAVENOUS

## 2017-02-06 MED ORDER — DEXAMETHASONE SODIUM PHOSPHATE 4 MG/ML IJ SOLN
INTRAMUSCULAR | Status: DC | PRN
  Administered 2017-02-06: 10 mg via INTRAVENOUS

## 2017-02-06 MED ORDER — PHENYLEPHRINE HCL 10 MG/ML IJ SOLN
INTRAVENOUS | Status: DC | PRN
  Administered 2017-02-06: 50 ug/min via INTRAVENOUS

## 2017-02-06 MED ORDER — PROPOFOL 500 MG/50ML IV EMUL
INTRAVENOUS | Status: DC | PRN
  Administered 2017-02-06: 30 ug/kg/min via INTRAVENOUS

## 2017-02-06 MED ORDER — FENTANYL CITRATE (PF) 100 MCG/2ML IJ SOLN
INTRAMUSCULAR | Status: AC
  Filled 2017-02-06: qty 2

## 2017-02-06 MED ORDER — VITAL HIGH PROTEIN PO LIQD
1000.0000 mL | ORAL | Status: DC
  Administered 2017-02-06: 1000 mL
  Filled 2017-02-06 (×2): qty 1000

## 2017-02-06 MED ORDER — DEXAMETHASONE SODIUM PHOSPHATE 10 MG/ML IJ SOLN
INTRAMUSCULAR | Status: AC
  Filled 2017-02-06: qty 1

## 2017-02-06 MED ORDER — METOCLOPRAMIDE HCL 5 MG/ML IJ SOLN
5.0000 mg | Freq: Three times a day (TID) | INTRAMUSCULAR | Status: AC
  Administered 2017-02-06 – 2017-02-08 (×6): 5 mg via INTRAVENOUS
  Filled 2017-02-06 (×6): qty 2

## 2017-02-06 MED ORDER — SUCCINYLCHOLINE CHLORIDE 200 MG/10ML IV SOSY
PREFILLED_SYRINGE | INTRAVENOUS | Status: DC | PRN
  Administered 2017-02-06: 20 mg via INTRAVENOUS

## 2017-02-06 MED ORDER — PRO-STAT SUGAR FREE PO LIQD: 30.0000 mL | Freq: Two times a day (BID) | ORAL | Status: DC

## 2017-02-06 MED ORDER — PHENYLEPHRINE HCL 10 MG/ML IJ SOLN
INTRAMUSCULAR | Status: AC
  Filled 2017-02-06: qty 1

## 2017-02-06 MED ORDER — ARTIFICIAL TEARS OPHTHALMIC OINT
TOPICAL_OINTMENT | OPHTHALMIC | Status: AC
  Filled 2017-02-06: qty 3.5

## 2017-02-06 MED ORDER — PROPOFOL 10 MG/ML IV BOLUS
INTRAVENOUS | Status: AC
  Filled 2017-02-06: qty 20

## 2017-02-06 MED ORDER — PROPOFOL 1000 MG/100ML IV EMUL
5.0000 ug/kg/min | INTRAVENOUS | Status: DC
Start: 2017-02-06 — End: 2017-02-06
  Filled 2017-02-06: qty 100

## 2017-02-06 MED ORDER — PROPOFOL 10 MG/ML IV BOLUS
INTRAVENOUS | Status: AC
  Filled 2017-02-06: qty 40

## 2017-02-06 MED ORDER — FENTANYL 2500MCG IN NS 250ML (10MCG/ML) PREMIX INFUSION
0.0000 ug/h | INTRAVENOUS | Status: DC
  Administered 2017-02-06: 25 ug/h via INTRAVENOUS
  Administered 2017-02-07 (×2): 150 ug/h via INTRAVENOUS
  Filled 2017-02-06 (×3): qty 250

## 2017-02-06 MED ORDER — SUCCINYLCHOLINE CHLORIDE 200 MG/10ML IV SOSY
PREFILLED_SYRINGE | INTRAVENOUS | Status: AC
  Filled 2017-02-06: qty 10

## 2017-02-06 MED ORDER — PROPOFOL 10 MG/ML IV BOLUS
INTRAVENOUS | Status: DC | PRN
  Administered 2017-02-06 (×4): 50 mg via INTRAVENOUS

## 2017-02-06 MED ORDER — ROCURONIUM BROMIDE 50 MG/5ML IV SOSY
PREFILLED_SYRINGE | INTRAVENOUS | Status: AC
  Filled 2017-02-06: qty 5

## 2017-02-06 MED ORDER — SODIUM CHLORIDE 0.9 % IV SOLN: INTRAVENOUS | Status: DC

## 2017-02-06 MED ORDER — PRO-STAT SUGAR FREE PO LIQD
30.0000 mL | Freq: Every day | ORAL | Status: DC
  Administered 2017-02-07: 30 mL
  Filled 2017-02-06: qty 30

## 2017-02-06 MED ORDER — ONDANSETRON HCL 4 MG/2ML IJ SOLN
INTRAMUSCULAR | Status: AC
  Filled 2017-02-06: qty 2

## 2017-02-06 MED ORDER — LIDOCAINE 2% (20 MG/ML) 5 ML SYRINGE
INTRAMUSCULAR | Status: DC | PRN
  Administered 2017-02-06: 80 mg via INTRAVENOUS

## 2017-02-06 MED ORDER — EPINEPHRINE PF 1 MG/ML IJ SOLN
INTRAMUSCULAR | Status: AC
  Filled 2017-02-06: qty 1

## 2017-02-06 MED ORDER — SODIUM CHLORIDE 0.9 % IV BOLUS (SEPSIS)
500.0000 mL | Freq: Once | INTRAVENOUS | Status: AC
  Administered 2017-02-06: 500 mL via INTRAVENOUS

## 2017-02-06 MED ORDER — INSULIN ASPART 100 UNIT/ML ~~LOC~~ SOLN
0.0000 [IU] | SUBCUTANEOUS | Status: DC
  Administered 2017-02-06 – 2017-02-07 (×2): 1 [IU] via SUBCUTANEOUS

## 2017-02-06 NOTE — Progress Notes (Deleted)
[facility] Progress Note Patient Name: Cassidy Sanders DOB: 12/14/1940 MRN: [id number]   Date of Service  02/06/2017  HPI/Events of Note  Patient transferred from the floor with SVT. Patient complaining of upper abdominal pain. Nurse reports upper abdomen is somewhat firm. Patient is awake and conscious. Hypertensive.   eICU Interventions  1. Nurse to give Lopressor 5 mg IV already ordered 2. Holding on diltiazem & adenosine for now 3. Continuous telemetry monitoring 4. Checking stat BMP & magnesium 5. Checking stat portable abdominal x-ray 6. Primary service physician & cardiology to evaluate patient      Intervention Category Major Interventions: Arrhythmia - evaluation and management  Tera Partridge 02/06/2017, 12:05 AM

## 2017-02-06 NOTE — Progress Notes (Signed)
Cassidy Sanders remains intubated after bronchoscopy due to hypotension requiring epinephrine.  Will sign off for now while patient is intubated, please let [facility] know when we can be of assistance again with Cassidy Sanders.

## 2017-02-06 NOTE — Transfer of Care (Signed)
Immediate Anesthesia Transfer of Care Note  Patient: Madoline Bhatt  Procedure(s) Performed: ENDOBRONCHIAL ULTRASOUND (Bilateral )  Patient Location: PACU and ICU  Anesthesia Type:General  Level of Consciousness: sedated and Patient remains intubated per anesthesia plan  Airway & Oxygen Therapy: Patient remains intubated per anesthesia plan  Post-op Assessment: Report given to RN and Post -op Vital signs reviewed and stable  Post vital signs: Reviewed and stable  Last Vitals:  Vitals:   02/06/17 0600 02/06/17 0649  BP: (!) 175/84 (!) 172/88  Pulse:  77  Resp: (!) 22 18  Temp:  (!) 36.4 C  SpO2: 91% 92%    Last Pain:  Vitals:   02/06/17 0649  TempSrc: Oral  PainSc:       Patient to ICU after critical event during bronchoscopy. Dr. Lake Bells and Dr. Gifford Shave agree.

## 2017-02-06 NOTE — Progress Notes (Signed)
Patient assessed at bedside, PEEP 8, FiO2 60% > reduced to 50%.  RT notified of changed.  Patient awake/alert, off sedation.  Combing her hair while on the vent.    Repeat FOB arranged for 1230PM tomorrow (11/28) per Dr. Lake Bells.     Cassidy Gens, NP-C [facility] Pgr: [phone] or if no answer [phone] 02/06/2017, 1:50 PM

## 2017-02-06 NOTE — Telephone Encounter (Signed)
Called ICU stepdown room 125 spoke with RN Earma Reading,  Patient on vent to come to radiation at [facility] for CT simulation to chest fropm 3-4pm today, she will accompy the patient along with Angie rspiratory therapist,called Angie at [phone] who is aw are of time to be here at 3pm, thanked both [place] and Angie, notified Katie and [place] CT  RT therapists as well 1:55 PM

## 2017-02-06 NOTE — Consult Note (Signed)
Radiation Oncology         [phone] ________________________________  Name: Cassidy Sanders        MRN: [id number]  Date of Service: 02/06/17 DOB: 1940/12/05  [id number], No Pcp Per  No ref. provider found     REFERRING PHYSICIAN: No ref. provider found   DIAGNOSIS: The primary encounter diagnosis was Mediastinal mass. Diagnoses of SVC syndrome, SOB (shortness of breath), Hemoptysis, Abdominal pain, Acute respiratory failure ([place]), and Encounter for orogastric (OG) tube placement were also pertinent to this visit.   HISTORY OF PRESENT ILLNESS: Cassidy Sanders is a 76 y.o. female seen at the request of Dr. Titus Mould with a newly diagnosed lung/mediastinal mass with SVC syndrome. The patient was in her usual state of health, however in the last month she developed edema of bilateral upper extremities as well as weight loss, and increasing shortness of breath which brought her to the ED. CTPA on 01/31/17 revealed a 6 x 5.5 x 5.8 cm mass  Confluent within the mediastinum narrowing the lumen of hte right mainstem bronchus, mass effect and luminal narrowing of the right pulmonary artery, and branches of the RUL, as well as marked extrensic compression of the SVC and azygos veins. Collateral flow along the chest wall was noted. She was admitted and CT of the abdomen and pelvis was negative for metastatic disease. She underwent bronchoscopy this morning, and had an apneic event. Since she has been intubated. She did not have cardiac arrest. Bronchoscopic brushings were obtained, but no core or FNA specimens. We're asked to consider palliative radiotherapy for her disease.    PREVIOUS RADIATION THERAPY: No   PAST MEDICAL HISTORY:  Past Medical History:  Diagnosis Date  . Brain tumor (benign) ([place])   . Depression   . Hypertension        PAST SURGICAL HISTORY: Past Surgical History:  Procedure Laterality Date  . ABDOMINAL HYSTERECTOMY    . BLADDER SURGERY    . BRAIN SURGERY        FAMILY HISTORY: No family history on file. No known family history of cancer.   SOCIAL HISTORY:  reports that she has quit smoking. she has never used smokeless tobacco. She reports that she drinks alcohol. She reports that she does not use drugs. 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 She is married and due to her husbands alzheimer's disease they reside at [facility] in [place]. Her daughter [name] is her HCPOA.   ALLERGIES: Latex; Gabapentin; and Tape   MEDICATIONS:  Current Facility-Administered Medications  Medication Dose Route Frequency Provider Last Rate Last Dose  . 0.9 %  sodium chloride infusion   Intravenous Continuous Raylene Miyamoto, MD 50 mL/hr at 02/06/17 1005    . acetaminophen (TYLENOL) tablet 650 mg  650 mg Oral Q6H PRN Oswald Hillock, MD       Or  . acetaminophen (TYLENOL) suppository 650 mg  650 mg Rectal Q6H PRN Oswald Hillock, MD      . albuterol (PROVENTIL) (2.5 MG/3ML) 0.083% nebulizer solution 2.5 mg  2.5 mg Nebulization Q4H PRN Charlynne Cousins, MD   2.5 mg at 02/05/17 0101  . bisoprolol (ZEBETA) tablet 5 mg  5 mg Oral Daily Tanda Rockers, MD   5 mg at 02/05/17 1110  . dexamethasone (DECADRON) injection 8 mg  8 mg Intravenous Q8H Ollis, Brandi L, NP   8 mg at 02/06/17 7989  . feeding supplement (PRO-STAT  SUGAR FREE 64) liquid 30 mL  30 mL Per Tube Daily Raylene Miyamoto, MD      . feeding supplement (VITAL HIGH PROTEIN) liquid 1,000 mL  1,000 mL Per Tube Continuous Raylene Miyamoto, MD      . fentaNYL 2578mcg in NS 272mL (7mcg/ml) infusion-PREMIX  0-400 mcg/hr Intravenous Continuous Raylene Miyamoto, MD 2.5 mL/hr at 02/06/17 1138 25 mcg/hr at 02/06/17 1138  . hydrALAZINE (APRESOLINE) tablet 10 mg  10 mg Oral Q8H Caren Griffins, MD   10 mg at 02/06/17 4128  . insulin aspart (novoLOG) injection 0-9 Units  0-9 Units Subcutaneous TID WC Raylene Miyamoto, MD   1 Units at 02/06/17 1137  . iopamidol  (ISOVUE-300) 61 % injection 15 mL  15 mL Oral Once PRN Short, Noah Delaine, MD      . ipratropium-albuterol (DUONEB) 0.5-2.5 (3) MG/3ML nebulizer solution 3 mL  3 mL Nebulization TID Charlynne Cousins, MD   3 mL at 02/05/17 1957  . ondansetron (ZOFRAN) tablet 4 mg  4 mg Oral Q6H PRN Oswald Hillock, MD       Or  . ondansetron (ZOFRAN) injection 4 mg  4 mg Intravenous Q6H PRN Oswald Hillock, MD      . pantoprazole (PROTONIX) EC tablet 40 mg  40 mg Oral BID AC Tanda Rockers, MD   40 mg at 02/05/17 1753  . sertraline (ZOLOFT) tablet 50 mg  50 mg Oral Daily Oswald Hillock, MD   50 mg at 02/05/17 1110  . sodium chloride (OCEAN) 0.65 % nasal spray 1 spray  1 spray Each Nare PRN Caren Griffins, MD      . traZODone (DESYREL) tablet 50 mg  50 mg Oral QHS PRN Lovey Newcomer T, NP   50 mg at 02/05/17 2144  . vitamin E capsule 400 Units  400 Units Oral Daily Oswald Hillock, MD   400 Units at 02/05/17 1111     REVIEW OF SYSTEMS: On review of systems, the patient is unable to speak but per her daughter, she developed edema of her upper extremities about a month ago. She has had about 20 pounds or more of weight loss, and developed chest pain and progressive shortness of breath. Her daughter is not aware of any abdominal pain, nausea, vomiting, new joint aches or pains, fevers, or chills. A complete review of systems is obtained per her daughter and is otherwise negative.     PHYSICAL EXAM:  Wt Readings from Last 3 Encounters:  02/06/17 194 lb (88 kg)  01/03/17 198 lb 6.6 oz (90 kg)   Temp Readings from Last 3 Encounters:  02/06/17 98.2 F (36.8 C) (Oral)  01/03/17 98.2 F (36.8 C) (Oral)   BP Readings from Last 3 Encounters:  02/06/17 (!) 152/75  01/03/17 120/73   Pulse Readings from Last 3 Encounters:  02/06/17 77  01/03/17 78   Pain Assessment Pain Score: 0-No pain/10  In general this is a well appearing though intubated caucasian female in no acute distress. She is suprisingly alert and  oriented x4 and appropriate throughout the examination and by writing on a piece of paper is able to communicate. HEENT reveals that the patient is normocephalic, atraumatic. EOMs are intact. PERRLA. Skin is intact without any evidence of gross lesions, but bilateral upper extremities and her neck are edematous. Visible collateral veins are noted deep to the skin.  Cardiopulmonary assessment is negative for acute distress and she exhibits positive pressure based  breathing. She uses oral suction to help her from gagging.     ECOG = 4  0 - Asymptomatic (Fully active, able to carry on all predisease activities without restriction)  1 - Symptomatic but completely ambulatory (Restricted in physically strenuous activity but ambulatory and able to carry out work of a light or sedentary nature. For example, light housework, office work)  2 - Symptomatic, <50% in bed during the day (Ambulatory and capable of all self care but unable to carry out any work activities. Up and about more than 50% of waking hours)  3 - Symptomatic, >50% in bed, but not bedbound (Capable of only limited self-care, confined to bed or chair 50% or more of waking hours)  4 - Bedbound (Completely disabled. Cannot carry on any self-care. Totally confined to bed or chair)  5 - Death   Eustace Pen MM, Creech RH, Tormey DC, et al. [phone]). "Toxicity and response criteria of the [facility]". [place] Oncol. 5 (6): 649-55    LABORATORY DATA:  Lab Results  Component Value Date   WBC 9.6 02/06/2017   HGB 13.0 02/06/2017   HCT 40.7 02/06/2017   MCV 90.2 02/06/2017   PLT 160 02/06/2017   Lab Results  Component Value Date   NA 142 02/06/2017   K 3.6 02/06/2017   CL 107 02/06/2017   CO2 28 02/06/2017   Lab Results  Component Value Date   ALT 19 02/01/2017   AST 28 02/01/2017   ALKPHOS 85 02/01/2017   BILITOT 0.7 02/01/2017      RADIOGRAPHY: Dg Chest 1 View  Result Date: 02/05/2017 CLINICAL  DATA:  Hemoptysis EXAM: CHEST 1 VIEW COMPARISON:  02/05/2017 FINDINGS: Airspace disease noted in the medial right upper lobe corresponding to the right paratracheal/suprahilar mass seen in this area on prior chest CT. Probable postobstructive process in the medial right upper lobe. No confluent opacity on the left. Heart is normal size. No effusions or acute bony abnormality. IMPRESSION: Right suprahilar/paratracheal mass with medial right upper lobe airspace opacity, likely postobstructive atelectasis or pneumonia. Electronically Signed   By: Rolm Baptise M.D.   On: 02/05/2017 08:46   Ct Angio Chest Pe W And/or Wo Contrast  Result Date: 01/31/2017 CLINICAL DATA:  Dyspnea x1 month without chest pain. EXAM: CT ANGIOGRAPHY CHEST WITH CONTRAST TECHNIQUE: Multidetector CT imaging of the chest was performed using the standard protocol during bolus administration of intravenous contrast. Multiplanar CT image reconstructions and MIPs were obtained to evaluate the vascular anatomy. CONTRAST:  173mL ISOVUE-370 IOPAMIDOL (ISOVUE-370) INJECTION 76% COMPARISON:  01/03/2017 FINDINGS: Cardiovascular: Satisfactory opacification of the pulmonary arterial system. No acute pulmonary embolus is noted. There is mild aortic atherosclerosis without aneurysm. Left main and three-vessel coronary arteriosclerosis is noted. Heart is enlarged without pericardial effusion. Mediastinum/Nodes: Confluent soft tissue mass within the mediastinum along the paratracheal, pre carinal and subcarinal portion of the mediastinum narrowing the lumen of the right mainstem bronchus as well as causing mass effect and luminal narrowing of the right pulmonary artery and branches to the right upper lobe. Additionally, there is marked luminal extrinsic impression on the SVC and azygos veins causing venous collaterals along the anterior right chest wall and mediastinum via intercostals in particular the left superior intercostal vein via accessory  hemiazygous. This mass is estimated at 6 x 5.5 x 5.8 cm. The thyroid, trachea and left mainstem bronchus are patent. Esophagus is not well characterized due to the adjacent mass. Lungs/Pleura: No effusion or pneumothorax. There  is dependent atelectasis within both lower lobes and subsegmental atelectasis along the medial aspect of the right upper lobe. No postobstructive pneumonia. No dominant pulmonary mass. Mild hypoventilatory changes are believed to account for the bilateral ground-glass opacities noted. There are a few tiny subpleural nodular densities on the order of 3 mm or less bilaterally the upper lobes. Upper Abdomen: Bilateral adrenal nodules are noted on the left measuring 2.7 cm in maximum dimension and on the right 1.7 cm. Both demonstrate low Hounsfield units suggesting adenomas. Nonspecific hypervascular blush in the left hepatic lobe measuring 15 mm. Layering calculi are noted within the included gallbladder without wall thickening. Musculoskeletal: No aggressive nor suspicious osseous lesions. Review of the MIP images confirms the above findings. IMPRESSION: 1. 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 Additional luminal narrowing of the right main pulmonary artery and branches to the right upper lobe and luminal narrowing of the right mainstem bronchus. 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 Other consideration would include metastatic lymphadenopathy from other primary source of malignancy. 2. No acute pulmonary embolus. 3. Left main and three-vessel coronary arteriosclerosis. 4. 3 mm or less subpleural nodular densities are seen in the upper lobes bilaterally. No follow-up needed if patient is low-risk (and has no known or  suspected primary neoplasm). Non-contrast chest CT can be considered in 12 months if patient is high-risk. This recommendation follows the consensus statement: Guidelines for Management of Incidental Pulmonary Nodules Detected on CT Images: From the Fleischner Society 2017; Radiology 2017; 284:228-243. 5. Left 2.7 cm and right 1.7 cm adrenal nodules favoring adenomas given low Hounsfield units within both nodules. 6. Nonspecific hypervascular blush within the left hepatic lobe measuring 15 mm. Hypervascular hepatic lobe lesion versus vascular anomaly. 7. Uncomplicated cholelithiasis. Aortic Atherosclerosis (ICD10-I70.0). Electronically Signed   By: Ashley Royalty M.D.   On: 01/31/2017 14:36   Ct Abdomen Pelvis W Contrast  Result Date: 02/01/2017 CLINICAL DATA:  76 year old female with a newly diagnosed right hilar mass. CT scan of the abdomen and pelvis ordered for staging. EXAM: CT ABDOMEN AND PELVIS WITH CONTRAST TECHNIQUE: Multidetector CT imaging of the abdomen and pelvis was performed using the standard protocol following bolus administration of intravenous contrast. CONTRAST:  144mL ISOVUE-300 IOPAMIDOL (ISOVUE-300) INJECTION 61% COMPARISON:  CT scan of the chest 01/31/2017 FINDINGS: Lower chest: Minimal dependent atelectasis. The right hilar mass is not included in the field of view. The heart is normal in size. Coronary artery calcifications are present. No pericardial effusion. Unremarkable visualized distal thoracic esophagus. Hepatobiliary: Normal hepatic morphology and contours. No evidence of hepatic metastatic disease. The portal veins are patent. High attenuation material layers within the gallbladder lumen consistent with cholelithiasis. No biliary ductal dilatation. Pancreas: Unremarkable. No pancreatic ductal dilatation or surrounding inflammatory changes. Spleen: Normal in size without focal abnormality. Adrenals/Urinary Tract: 1.9 cm low-attenuation left adrenal nodule is favored to represent a  benign adenoma. A 1.5 cm right adrenal nodule is also low in attenuation and favored to be benign in nature. No evidence of enhancing renal mass, hydronephrosis or nephrolithiasis. Tiny subcentimeter low-attenuation renal lesions are too small to characterize and statistically highly likely benign cysts. The ureters and bladder are unremarkable. Stomach/Bowel: No evidence of obstruction or focal bowel wall thickening. Normal appendix in the right  lower quadrant. The terminal ileum is unremarkable. Vascular/Lymphatic: Scattered atherosclerotic vascular calcifications including along the length of the abdominal aorta. The infrarenal abdominal aorta is ectatic measuring up to 2.6 cm in diameter. No suspicious lymphadenopathy. Reproductive: Surgical changes of prior hysterectomy. Unremarkable adnexal without evidence of mass. Other: No free fluid.  Small fat containing umbilical hernia. Musculoskeletal: No acute fracture or aggressive appearing lytic or blastic osseous lesion. Multilevel degenerative disc disease and facet arthropathy. Compression deformity of the superior endplate of T01 without focal lucency is favored to be chronic in nature. IMPRESSION: 1. No definite evidence of metastatic disease within the abdomen or pelvis. 2. Bilateral low-attenuation adrenal nodules are favored to represent benign adenomas. However, attention is recommended on follow-up PET-CT imaging to assess for superimposed hypermetabolism which could indicate a collision tumor. 3. Ectatic abdominal aorta at risk for aneurysm development. Recommend followup by ultrasound in 5 years. This recommendation follows ACR consensus guidelines: White Paper of the ACR Incidental Findings Committee II on Vascular Findings. J Am Coll Radiol 2013; 77:939-030. 4.  Aortic Atherosclerosis (ICD10-170.0) 5. Multilevel degenerative disc disease and facet arthropathy. 6. T12 compression deformity with approximately 40% height loss anteriorly appears chronic  but is age indeterminate without prior imaging for comparison. Electronically Signed   By: Jacqulynn Cadet M.D.   On: 02/01/2017 12:40   Dg Chest Port 1 View  Result Date: 02/06/2017 CLINICAL DATA:  76 year old female with acute respiratory failure. Subsequent encounter. EXAM: PORTABLE CHEST 1 VIEW COMPARISON:  02/05/2017 chest x-ray. FINDINGS: Endotracheal tube tip 3.7 cm above the carina. Nasogastric tube courses below the diaphragm. Tip is not included on the present exam. Right hilar mass. Interval development of consolidation right lung base may represent obstructive atelectasis/infiltrate given the presence of right hilar mass. Mild pulmonary vascular prominence superimposed upon chronic lung changes. Cardiomegaly. Calcified aorta. IMPRESSION: Right hilar mass. Interval development of consolidation right lung base may represent obstructive atelectasis/infiltrate given the presence of right hilar mass causing bronchial narrowed. Cardiomegaly. Endotracheal tube tip 3.7 cm above the carina. Electronically Signed   By: Genia Del M.D.   On: 02/06/2017 12:14   Dg Chest Port 1 View  Result Date: 02/05/2017 CLINICAL DATA:  Shortness of breath EXAM: PORTABLE CHEST 1 VIEW COMPARISON:  01/03/2017, 01/31/2017 FINDINGS: Borderline cardiomegaly. No consolidation or effusion. Negative for pneumothorax. Aortic atherosclerosis. Decreased right perihilar and paramediastinal opacity IMPRESSION: 1. Negative for edema or infiltrate 2. Interval decrease in right paratracheal mass Electronically Signed   By: Donavan Foil M.D.   On: 02/05/2017 01:42   Dg Abd Portable 1v  Result Date: 02/06/2017 CLINICAL DATA:  Orogastric tube placement. EXAM: PORTABLE ABDOMEN - 1 VIEW COMPARISON:  Radiograph of same day. FINDINGS: The bowel gas pattern is normal. Distal tip of orogastric tube is seen expected position of distal stomach. No radio-opaque calculi or other significant radiographic abnormality are seen. IMPRESSION:  Distal tip of orogastric tube seen in expected position of distal stomach. No evidence of bowel obstruction or ileus. Electronically Signed   By: Marijo Conception, M.D.   On: 02/06/2017 12:19   Dg Abd Portable 1v  Result Date: 02/06/2017 CLINICAL DATA:  Abdominal pain EXAM: PORTABLE ABDOMEN - 1 VIEW COMPARISON:  02/01/2017 FINDINGS: Vascular calcifications. Nonobstructed bowel-gas pattern calcified pelvic phleboliths. IMPRESSION: Nonobstructed gas pattern Electronically Signed   By: Donavan Foil M.D.   On: 02/06/2017 00:45       IMPRESSION/PLAN: 1. 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 Dr. Lisbeth Renshaw has reviewed her  scans independantly and we've discussed her case with pulmonary and critical care and we feel that she would be an appropriate candidate for radiotherapy given the clinic situation. She is in agreement after we discussed consideration of 10 fractions of palliative raditoherapy to the chest. Her systemic imaging does not appear to reveal any metastatic disease, and she could be a candidate once she completes formal staging for additional radiotherapy, depending on her histology. We discussed the risks, benefits, short, and long term effects of radiotherapy, and the patient is interested in proceeding. Written consent is obtained per her daughter, and with acknowledgement from the patient and placed in the chart, a copy was provided to the patient.  In a visit lasting 70 minutes, greater than 50% of the time was spent face to face discussing the role of treatment, and coordinating the patient's care.  The above documentation reflects my direct findings during this shared patient visit. Please see the separate note by Dr. Lisbeth Renshaw on this date for the remainder of the patient's plan of care.    Carola Rhine, PAC

## 2017-02-06 NOTE — Progress Notes (Signed)
[facility] Progress Note Patient Name: Cassidy Sanders DOB: 1940-05-19 MRN: [id number]   Date of Service  02/06/2017  HPI/Events of Note  High TF residuals  eICU Interventions  Start Reglan Trickle Feeds and titate up     Intervention Category Intermediate Interventions: Other:  DETERDING,ELIZABETH 02/06/2017, 10:20 PM

## 2017-02-06 NOTE — Anesthesia Preprocedure Evaluation (Addendum)
Anesthesia Evaluation  Patient identified by MRN, date of birth, ID band Patient awake    Reviewed: Allergy & Precautions, NPO status , Patient's Chart, lab work & pertinent test results, reviewed documented beta blocker date and time   Airway Mallampati: II  TM Distance: >3 FB Neck ROM: Full    Dental  (+) Teeth Intact, Dental Advisory Given   Pulmonary former smoker,  Paratracheal / Mediastinal Mass with Chronic SVC syndrome    Pulmonary exam normal breath sounds clear to auscultation       Cardiovascular hypertension, Pt. on home beta blockers and Pt. on medications Normal cardiovascular exam Rhythm:Regular Rate:Normal     Neuro/Psych PSYCHIATRIC DISORDERS Depression Brain tumor  negative neurological ROS     GI/Hepatic negative GI ROS, Neg liver ROS,   Endo/Other  negative endocrine ROS  Renal/GU negative Renal ROS     Musculoskeletal negative musculoskeletal ROS (+)   Abdominal   Peds  Hematology negative hematology ROS (+)   Anesthesia Other Findings Day of surgery medications reviewed with the patient.  Reproductive/Obstetrics                            Anesthesia Physical Anesthesia Plan  ASA: III  Anesthesia Plan: General   Post-op Pain Management:    Induction: Intravenous  PONV Risk Score and Plan: 3 and Dexamethasone, Ondansetron and Midazolam  Airway Management Planned: Oral ETT  Additional Equipment:   Intra-op Plan:   Post-operative Plan: Extubation in OR and Possible Post-op intubation/ventilation  Informed Consent: I have reviewed the patients History and Physical, chart, labs and discussed the procedure including the risks, benefits and alternatives for the proposed anesthesia with the patient or authorized representative who has indicated his/her understanding and acceptance.   Dental advisory given  Plan Discussed with: CRNA  Anesthesia Plan Comments:  (Have ketamine available for induction. Discussed with patient and patient's daughter possibility of post-op intubation due to recent hemoptysis, location of tumor and possible effects on respiration.)      Anesthesia Quick Evaluation

## 2017-02-06 NOTE — Progress Notes (Signed)
Video bronchoscopy performed prior to planned EBUS procedure.  Intervention bronchial brushings Procedure stopped after 2 brushings due to patient's BP dropping.  Kathie Dike RRT

## 2017-02-06 NOTE — H&P (Signed)
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  Has been found to have a large central mass.  Has noted hemoptysis, worsening oxygenation in the last 48 hours.  Past Medical History:  Diagnosis Date  . Brain tumor (benign) ([place])   . Depression   . Hypertension      No family history on file.   Social History   Socioeconomic History  . Marital status: Married    Spouse name: Not on file  . Number of children: Not on file  . Years of education: Not on file  . Highest education level: Not on file  Social Needs  . Financial resource strain: Not on file  . Food insecurity - worry: Not on file  . Food insecurity - inability: Not on file  . Transportation needs - medical: Not on file  . Transportation needs - non-medical: Not on file  Occupational History  . Not on file  Tobacco Use  . Smoking status: Former Research scientist (life sciences)  . Smokeless tobacco: Never Used  Substance and Sexual Activity  . Alcohol use: Yes    Comment: weekly  . Drug use: No  . Sexual activity: Not on file  Other Topics Concern  . Not on file  Social History Narrative  . Not on file     Allergies  Allergen Reactions  . Latex Dermatitis  . Gabapentin     Excessive sleep   . Tape Rash     @encmedstart @  Vitals:   02/06/17 0352 02/06/17 0400 02/06/17 0600 02/06/17 0649  BP:  (!) 165/90 (!) 175/84 (!) 172/88  Pulse:    77  Resp:  (!) 24 (!) 22 18  Temp: 97.8 F (36.6 C)   (!) 97.5 F (36.4 C)  TempSrc: Oral   Oral  SpO2:  90% 91% 92%  Weight:    194 lb (88 kg)  Height:    5\' 9"  (1.753 m)   6L [place]  General:  Resting comfortably in bed HENT: NCAT OP clear PULM: Diminished R base B, normal effort CV: RRR, no mgr GI: BS+, soft, nontender MSK: normal bulk and tone Neuro: awake, alert, no distress, MAEW  CBC    Component Value Date/Time   WBC 9.6 02/06/2017 0353   RBC 4.51 02/06/2017 0353   HGB 13.0 02/06/2017 0353   HCT 40.7 02/06/2017 0353   PLT 160 02/06/2017  0353   MCV 90.2 02/06/2017 0353   MCH 28.8 02/06/2017 0353   MCHC 31.9 02/06/2017 0353   RDW 14.5 02/06/2017 0353   LYMPHSABS 2.5 01/31/2017 1309   MONOABS 1.2 (H) 01/31/2017 1309   EOSABS 0.1 01/31/2017 1309   BASOSABS 0.0 01/31/2017 1309   BMET    Component Value Date/Time   NA 142 02/06/2017 0353   K 3.6 02/06/2017 0353   CL 107 02/06/2017 0353   CO2 28 02/06/2017 0353   GLUCOSE 149 (H) 02/06/2017 0353   BUN 22 (H) 02/06/2017 0353   CREATININE 0.69 02/06/2017 0353   CALCIUM 9.4 02/06/2017 0353   GFRNONAA >60 02/06/2017 0353   GFRAA >60 02/06/2017 0353   CT chest images: large mediastinal mass extending into the R lung, compressing the R mainstem bronchus  Impression/plan:  Large mediastinal mass compressing the R mainstem bronchus in a former smoker: most likely lung cancer.  Given her high oxygen needs she understands the risk of remaining hospitalized and even short term mechanical ventilation from the procedure.  We discussed this at length  this morning and she is willing to proceed.  Plan bronchoscopy and EBUS FNA today.  Roselie Awkward, MD [place] PCCM Pager: [phone] Cell: [phone] After 3pm or if no response, call [phone]

## 2017-02-06 NOTE — Anesthesia Postprocedure Evaluation (Signed)
Anesthesia Post Note  Patient: Cassidy Sanders  Procedure(s) Performed: ENDOBRONCHIAL ULTRASOUND (Bilateral )     Patient location during evaluation: SICU Anesthesia Type: General Level of consciousness: sedated Pain management: pain level controlled Vital Signs Assessment: post-procedure vital signs reviewed and stable Respiratory status: patient remains intubated per anesthesia plan Cardiovascular status: stable Postop Assessment: no apparent nausea or vomiting Anesthetic complications: no Comments: Procedure cancelled intra-operatively due to hypotension after bronchial brushings by proceduralist requiring resuscitation with vasoactive medications.  Decision made to halt procedure and transport patient to ICU intubated and sedated with possible attempt at bronchoscopy in ICU later today.    Last Vitals:  Vitals:   02/06/17 0854 02/06/17 0900  BP: (!) 162/112 120/64  Pulse:    Resp:  18  Temp:    SpO2:  93%    Last Pain:  Vitals:   02/06/17 0649  TempSrc: Oral  PainSc:                  Catalina Gravel

## 2017-02-06 NOTE — Progress Notes (Signed)
PULMONARY / CRITICAL CARE MEDICINE   Name: Nasia Cannan MRN: [id number] DOB: 1941/03/02    ADMISSION DATE:  01/31/2017 CONSULTATION DATE:  02/01/17  REFERRING MD:  Sheliah Plane / Frederich Chick  CHIEF COMPLAINT:  Sob/wheeze  HISTORY OF PRESENT ILLNESS:   76 yowf quit smoking 4 y ago without apparent sequelae admitted 11/21 with progressive doe with audible wheeze x 1 month MASS RT with compression, SVC syndrome.    SUBJECTIVE:  EBUS, bronch brushing, had event of apnea, near arrest , did not lose pulse Remains intubated in ICU post OP   VITAL SIGNS: BP (!) 84/50   Pulse 77   Temp (!) 97.5 F (36.4 C) (Oral)   Resp 15   Ht 5\' 9"  (1.753 m)   Wt 88 kg (194 lb)   SpO2 92%   BMI 28.65 kg/m   HEMODYNAMICS:    VENTILATOR SETTINGS: Vent Mode: PRVC FiO2 (%):  [100 %] 100 % Set Rate:  [14 bmp] 14 bmp Vt Set:  [530 mL] 530 mL PEEP:  [5 cmH20] 5 cmH20  INTAKE / OUTPUT: I/O last 3 completed shifts: In: 660 [P.O.:660] Out: 1000 [Urine:1000]  PHYSICAL EXAMINATION: General: rass -2 Neuro: rass -2, FC HEENT: full, ett PULM: wheezing left anterior, reduced rt CV:  s1 s2 RRR GI: soft BS wnl, no r Extremities: min edema   LABS:  BMET Recent Labs  Lab 02/01/17 0409 02/05/17 0416 02/06/17 0353  NA 141 141 142  K 3.9 4.0 3.6  CL 106 108 107  CO2 25 28 28   BUN 21* 17 22*  CREATININE 0.80 0.78 0.69  GLUCOSE 145* 152* 149*    Electrolytes Recent Labs  Lab 02/01/17 0409 02/05/17 0416 02/06/17 0353  CALCIUM 9.4 8.8* 9.4  MG  --   --  2.3  PHOS  --   --  3.3    CBC Recent Labs  Lab 02/01/17 0409 02/05/17 0416 02/06/17 0353  WBC 10.5 10.9* 9.6  HGB 14.0 12.7 13.0  HCT 43.9 40.2 40.7  PLT 175 150 160    Coag's Recent Labs  Lab 02/05/17 1101  APTT 25  INR 0.99    Sepsis Markers No results for input(s): LATICACIDVEN, PROCALCITON, O2SATVEN in the last 168 hours.  ABG No results for input(s): PHART, PCO2ART, PO2ART in the last 168 hours.  Liver  Enzymes Recent Labs  Lab 01/31/17 1309 02/01/17 0409  AST 18 28  ALT 17 19  ALKPHOS 76 85  BILITOT 1.1 0.7  ALBUMIN 3.6 3.7    Cardiac Enzymes Recent Labs  Lab 01/31/17 1309  TROPONINI <0.03    Glucose Recent Labs  Lab 02/05/17 1134 02/05/17 1746 02/06/17 0937  GLUCAP 136* 103* 138*    Imaging Dg Abd Portable 1v  Result Date: 02/06/2017 CLINICAL DATA:  Abdominal pain EXAM: PORTABLE ABDOMEN - 1 VIEW COMPARISON:  02/01/2017 FINDINGS: Vascular calcifications. Nonobstructed bowel-gas pattern calcified pelvic phleboliths. IMPRESSION: Nonobstructed gas pattern Electronically Signed   By: Donavan Foil M.D.   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  CT evaluation demonstrated a large (6x5 x 5.5 x 5.8 cm) paratracheal mediastinal mass causing narrowing of the SVC and SVC syndrome that results in collaterals.  In addition, she also has small pulmonary nodules (<84mm).  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  CVS: Tolerating propofol Event of preload reduction likely in OR -tele -Bp monitoring -trop , ecg post event -Pos balance  RENAL: Preload, svc -saline -chem in am   GI: resp failure, no extubation planned -start feeds -ppi to oral -place oGT  ENDO: -cbg, on steroids -Decadron to remain with obstruction    HEME Hemoptysis secondary to Mediastinal Mass P: No heparin scd Cbc in am   ID: No infection present, at risk post obstructive PNA -monitor temp curve -monitor pcxr and secretions  Updated by Dr Lake Bells of events  I updated husband  Ccm  time 23 min   Lavon Paganini. Titus Mould, MD, [place] Pgr: [facility] 02/06/2017 9:54 AM

## 2017-02-06 NOTE — Progress Notes (Signed)
Patient's family Einar Pheasant and Sheron Nightingale) both sister's of the patient, had concerns over a possible repeat attempt to do biopsy in am.  At this point both sisters do not want to proceed with biopsy until they can discuss their concerns with the physician.  If Einar Pheasant not present she can be reached at [phone].  [place] ICU/SD Care Coordinator / Rapid Response Nurse

## 2017-02-06 NOTE — Progress Notes (Signed)
Date: February 06, 2017 Jeananne Bedwell, BSN, RN3, CCM  [phone] Chart and notes review for patient progress and needs. Will follow for case management and discharge needs. Next review date: [id number] 

## 2017-02-06 NOTE — Op Note (Signed)
Video Bronchoscopy with Endobronchial Ultrasound Procedure Note  Date of Operation: 02/06/2017  Pre-op Diagnosis: Lung mass, mediastinal mass  Post-op Diagnosis: Same  Surgeon: Roselie Awkward  Assistants: None  Anesthesia: General endotracheal anesthesia  Operation: Flexible video fiberoptic bronchoscopy with endobronchial ultrasound and biopsies.  Estimated Blood Loss: less than 50   Complications: low blood pressures  Indications and History: Cassidy Sanders is a 76 y.o. female with a history of tobacco abuse who was found to have a large mediastinal mass compressing the right mainstem bronchus.  The risks, benefits, complications, treatment options and expected outcomes were discussed with the patient.  The possibilities of respiratory failure, pneumothorax, pneumonia, reaction to medication, pulmonary aspiration, perforation of a viscus, bleeding, failure to diagnose a condition and creating a complication requiring transfusion or operation were discussed with the patient who freely signed the consent.    Description of Procedure: The patient was examined in the preoperative area and history and data from the preprocedure consultation were reviewed. It was deemed appropriate to proceed.  The patient was taken to the [facility] Endoscopy suite identified as Cassidy Sanders and the procedure verified as Flexible Video Fiberoptic Bronchoscopy.  A Time Out was held and the above information confirmed. After being taken to the operating room general anesthesia was initiated and the patient  was orally intubated.  At this point she had hypoxemia that responded to bag ventilation. The video fiberoptic bronchoscope was introduced via the endotracheal tube and a general inspection was performed which showed an infiltrative mass in the right mainstem bronchus.  There was also blood clot at the carina and in the right mainstem.  I was able to inspect the left lung which was free of tumor or mass  but she had significant bronchomalacia in the left lower lobe which would cause spontaneous collapse of all lower lobe openings.  I was able to pass the camera into the bronchus intermedius and inspect the right upper lobe orifice and the right middle lobe, but I could not pass the camera into the right lower lobe.  I took two brushings from the right mainstem bronchus.  However after as I was preparing to brush the right mainstem again the patient became hypotensive and required epinephrine.  We stopped the procedure at this point.  We did not perform EBUS.  We moved the patient to the ICU at this point and her family was updated.   Samples: 1. Endobronchial brushing from the right mainstem bronchus   Plans:  The patient was moved back to her bed in the ICU for stabilization.    I had a lengthy conversation with the patient's daughters Cassidy Sanders and Cassidy Sanders afterwards.  Prior to the procedure the patient and I discussed that this would be a high risk procedure and there was a high likelihood she would need to return to the ICU on a ventilator.  The patient told me that her desire was to return home as soon as possible.  We discussed that given her high oxygen needs the only way to try to meet this goal would be to get a diagnosis and start treating the tumor.  She voiced understanding and was willing to proceed with the bronchoscopy.     At this point the best approach will be to consider radiation of the mass.  If she stabilizes overnight I can attempt another bronchoscopy in the morning or at noon tomorrow in the ICU.  Roselie Awkward, MD  PCCM Pager: [phone] Cell: [phone] After 3pm  or if no response, call [phone]

## 2017-02-06 NOTE — Progress Notes (Signed)
Initial Nutrition Assessment  DOCUMENTATION CODES:   Not applicable  INTERVENTION:  - Will order Vital High Protein @ 45 mL/hr with 30 mL Prostat once/day. This regimen  + kcal from current Propofol rate will provide 1597 kcal, 109 grams of protein, and 903 mL free water.  - Will monitor Propofol changes. - PEPuP protocol.  NUTRITION DIAGNOSIS:   Inadequate oral intake related to inability to eat as evidenced by NPO status.  GOAL:   Patient will meet greater than or equal to 90% of their needs  MONITOR:   Vent status, TF tolerance, Weight trends, Labs  REASON FOR ASSESSMENT:   Ventilator, Consult Enteral/tube feeding initiation and management  ASSESSMENT:   76 y.o. female with history of depression, benign meningioma status post resection, HTN. She came to the hospital with intermittent SOB for the past 1 month.  Patient says that SOB is worse on exertion. She was seen on 10/24 in the ED and prescribed azithromycin and albuterol for acute bronchitis. The patient says that lung congestion symptoms improved with antibiotics but she continued to have wheezing with dyspnea on exertion.  BMI indicates overweight status. Per chart review, pt was previously on Regular diet and eating 50-100% of meals (mainly 100%) since admission. Daughter and husband at bedside and confirm pt had a good appetite since admission and daughter states pt ate a chicken salad sandwich for dinner last night without issue. Daughter states "I think she has good muscle tone for her age."   Per Dr. Janit Pagan note this AM, pt with event of apnea and near arrest. Pt intubated, sedated at this time with OGT in place. MD note states possible need for repeat bronch  Patient is currently intubated on ventilator support MV: 8.2 L/min Temp (24hrs), Avg:97.9 F (36.6 C), Min:97.5 F (36.4 C), Max:98.2 F (36.8 C) Propofol: 15.8 ml/hr (417 kcal) BP: 71/42 and MAP: 51  Medications reviewed; sliding scale Novolog,  40 mg Protonix per OGT BID, 400 units vitamin E per OGT/day.  Labs reviewed; CBG: 138 mg/dL this AM, BUN: 22 mg/dL.   IVF: NS @ 50 mL/hr.  Drip: Propofol @ 30 mcg/kg/min.     NUTRITION - FOCUSED PHYSICAL EXAM:  No fat wasting and no muscle wasting noted.   Diet Order:  Diet NPO time specified  EDUCATION NEEDS:   No education needs have been identified at this time  Skin:  Skin Assessment: Reviewed RN Assessment  Last BM:  11/26  Height:   Ht Readings from Last 1 Encounters:  02/06/17 5\' 9"  (1.753 m)    Weight:   Wt Readings from Last 1 Encounters:  02/06/17 194 lb (88 kg)    Ideal Body Weight:  65.91 kg  BMI:  Body mass index is 28.65 kg/m.  Estimated Nutritional Needs:   Kcal:  1561  Protein:  106-132 grams (1.2-1.5 grams/kg)  Fluid:  >/= 1.7 L/day      Jarome Matin, MS, RD, LDN, [facility] Inpatient Clinical Dietitian Pager # [phone] After hours/weekend pager # [phone]

## 2017-02-06 NOTE — Anesthesia Procedure Notes (Signed)
Procedure Name: Intubation Date/Time: 02/06/2017 7:51 AM Performed by: Lind Covert, CRNA Pre-anesthesia Checklist: Patient identified, Emergency Drugs available, Suction available, Patient being monitored and Timeout performed Patient Re-evaluated:Patient Re-evaluated prior to induction Oxygen Delivery Method: Circle system utilized Preoxygenation: Pre-oxygenation with 100% oxygen Induction Type: IV induction and Combination inhalational/ intravenous induction Laryngoscope Size: Mac, 4 and Glidescope Grade View: Grade I Tube type: Oral Tube size: 8.5 mm Number of attempts: 1 Airway Equipment and Method: Stylet and Video-laryngoscopy Placement Confirmation: ETT inserted through vocal cords under direct vision,  positive ETCO2 and breath sounds checked- equal and bilateral Secured at: 21 cm Tube secured with: Tape Dental Injury: Teeth and Oropharynx as per pre-operative assessment  Difficulty Due To: Difficulty was anticipated

## 2017-02-07 ENCOUNTER — Ambulatory Visit
Admit: 2017-02-07 | Discharge: 2017-02-07 | Disposition: A | Discharge status: Home / Self Care | Payer: Medicare Other | Attending: Radiation Oncology | Admitting: Radiation Oncology

## 2017-02-07 ENCOUNTER — Telehealth: Payer: Self-pay | Admitting: *Deleted

## 2017-02-07 ENCOUNTER — Encounter: Payer: Self-pay | Admitting: *Deleted

## 2017-02-07 ENCOUNTER — Encounter (HOSPITAL_COMMUNITY): Payer: Medicare Other

## 2017-02-07 ENCOUNTER — Inpatient Hospital Stay (HOSPITAL_COMMUNITY): Payer: Medicare Other

## 2017-02-07 DIAGNOSIS — F329 Major depressive disorder, single episode, unspecified: Secondary | ICD-10-CM | POA: Insufficient documentation

## 2017-02-07 DIAGNOSIS — I10 Essential (primary) hypertension: Secondary | ICD-10-CM | POA: Insufficient documentation

## 2017-02-07 DIAGNOSIS — Z9889 Other specified postprocedural states: Secondary | ICD-10-CM | POA: Insufficient documentation

## 2017-02-07 DIAGNOSIS — C3490 Malignant neoplasm of unspecified part of unspecified bronchus or lung: Secondary | ICD-10-CM | POA: Insufficient documentation

## 2017-02-07 DIAGNOSIS — D496 Neoplasm of unspecified behavior of brain: Secondary | ICD-10-CM | POA: Insufficient documentation

## 2017-02-07 DIAGNOSIS — J9600 Acute respiratory failure, unspecified whether with hypoxia or hypercapnia: Secondary | ICD-10-CM

## 2017-02-07 DIAGNOSIS — J9690 Respiratory failure, unspecified, unspecified whether with hypoxia or hypercapnia: Secondary | ICD-10-CM

## 2017-02-07 DIAGNOSIS — Z510 Encounter for antineoplastic radiation therapy: Secondary | ICD-10-CM | POA: Insufficient documentation

## 2017-02-07 LAB — COMPREHENSIVE METABOLIC PANEL
ALBUMIN: 3.8 g/dL (ref 3.5–5.0)
ALT: 20 U/L (ref 14–54)
AST: 22 U/L (ref 15–41)
Alkaline Phosphatase: 67 U/L (ref 38–126)
Anion gap: 7 (ref 5–15)
BUN: 36 mg/dL — AB (ref 6–20)
CO2: 27 mmol/L (ref 22–32)
Calcium: 9.1 mg/dL (ref 8.9–10.3)
Chloride: 108 mmol/L (ref 101–111)
Creatinine, Ser: 0.81 mg/dL (ref 0.44–1.00)
GFR calc Af Amer: >60 mL/min (ref >60)
Glucose, Bld: 127 mg/dL — ABNORMAL HIGH (ref 65–99)
POTASSIUM: 3.9 mmol/L (ref 3.5–5.1)
Sodium: 142 mmol/L (ref 135–145)
Total Bilirubin: 0.6 mg/dL (ref 0.3–1.2)
Total Protein: 7.1 g/dL (ref 6.5–8.1)

## 2017-02-07 LAB — CBC WITH DIFFERENTIAL/PLATELET
Basophils Absolute: 0 10*3/uL (ref 0.0–0.1)
Basophils Relative: 0 %
EOS PCT: 0 %
Eosinophils Absolute: 0 10*3/uL (ref 0.0–0.7)
HCT: 42.7 % (ref 36.0–46.0)
HEMOGLOBIN: 13.5 g/dL (ref 12.0–15.0)
LYMPHS ABS: 1.3 10*3/uL (ref 0.7–4.0)
LYMPHS PCT: 9 %
MCH: 29.2 pg (ref 26.0–34.0)
MCHC: 31.6 g/dL (ref 30.0–36.0)
MCV: 92.2 fL (ref 78.0–100.0)
MONOS PCT: 6 %
Monocytes Absolute: 0.9 10*3/uL (ref 0.1–1.0)
NEUTROS PCT: 85 %
Neutro Abs: 11.9 10*3/uL — ABNORMAL HIGH (ref 1.7–7.7)
Platelets: 174 10*3/uL (ref 150–400)
RBC: 4.63 MIL/uL (ref 3.87–5.11)
RDW: 14.6 % (ref 11.5–15.5)
WBC: 14 10*3/uL — AB (ref 4.0–10.5)

## 2017-02-07 LAB — GLUCOSE, CAPILLARY
GLUCOSE-CAPILLARY: 102 mg/dL — AB (ref 65–99)
GLUCOSE-CAPILLARY: 127 mg/dL — AB (ref 65–99)
GLUCOSE-CAPILLARY: 135 mg/dL — AB (ref 65–99)
GLUCOSE-CAPILLARY: 137 mg/dL — AB (ref 65–99)
Glucose-Capillary: 110 mg/dL — ABNORMAL HIGH (ref 65–99)
Glucose-Capillary: 92 mg/dL (ref 65–99)
Glucose-Capillary: 110 mg/dL — ABNORMAL HIGH (ref 65–99)

## 2017-02-07 LAB — MAGNESIUM
MAGNESIUM: 2.4 mg/dL (ref 1.7–2.4)
Magnesium: 2.3 mg/dL (ref 1.7–2.4)

## 2017-02-07 LAB — PHOSPHORUS
PHOSPHORUS: 4 mg/dL (ref 2.5–4.6)
Phosphorus: 3.4 mg/dL (ref 2.5–4.6)

## 2017-02-07 MED ORDER — INSULIN ASPART 100 UNIT/ML ~~LOC~~ SOLN
0.0000 [IU] | SUBCUTANEOUS | Status: DC
  Administered 2017-02-07 (×2): 1 [IU] via SUBCUTANEOUS
  Administered 2017-02-08: 2 [IU] via SUBCUTANEOUS
  Administered 2017-02-08 – 2017-02-10 (×6): 1 [IU] via SUBCUTANEOUS
  Administered 2017-02-10: 3 [IU] via SUBCUTANEOUS
  Administered 2017-02-10: 1 [IU] via SUBCUTANEOUS
  Administered 2017-02-11: 2 [IU] via SUBCUTANEOUS

## 2017-02-07 MED ORDER — PRO-STAT SUGAR FREE PO LIQD
30.0000 mL | Freq: Two times a day (BID) | ORAL | Status: DC
  Administered 2017-02-07 – 2017-02-09 (×2): 30 mL
  Filled 2017-02-07 (×3): qty 30

## 2017-02-07 MED ORDER — VITAL AF 1.2 CAL PO LIQD
1000.0000 mL | ORAL | Status: DC
  Administered 2017-02-07: 1000 mL
  Filled 2017-02-07 (×3): qty 1000

## 2017-02-07 NOTE — Progress Notes (Signed)
IP PROGRESS NOTE  Subjective:   Events noted on the last few days.  Attempted biopsy was unsuccessful with the patient experiencing respiratory distress.  She remains intubated at this time.  Radiation oncology input appreciated to start palliative radiation therapy empirically.  Objective:  Vital signs in last 24 hours: Temp:  [97.3 F (36.3 C)-98.5 F (36.9 C)] 97.6 F (36.4 C) (11/28 0400) Pulse Rate:  [70-88] 72 (11/28 0305) Resp:  [6-49] 15 (11/28 0700) BP: (68-200)/(39-112) 145/66 (11/28 0700) SpO2:  [91 %-100 %] 98 % (11/28 0700) FiO2 (%):  [40 %-100 %] 40 % (11/28 0328) Weight change:  Last BM Date: 02/05/17  Intake/Output from previous day: 11/27 0701 - 11/28 0700 In: 2142.3 [I.V.:1784.8; NG/GT:357.5] Out: 1300 [Urine:1300] Alert, awake woman while intubated.   Lab Results: Recent Labs    02/06/17 0353 02/07/17 0601  WBC 9.6 14.0*  HGB 13.0 13.5  HCT 40.7 42.7  PLT 160 174    BMET Recent Labs    02/06/17 0353 02/07/17 0601  NA 142 142  K 3.6 3.9  CL 107 108  CO2 28 27  GLUCOSE 149* 127*  BUN 22* 36*  CREATININE 0.69 0.81  CALCIUM 9.4 9.1      Medications: I have reviewed the patient's current medications.  Assessment/Plan:  76 year old woman with the following issues:  1.  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  These findings appear to be chronic with anterior chest wall collaterals noted.    Attempted biopsy was complicated with hypotension and respiratory failure and she remains intubated at this time.  Endobronchial brushings from the right mainstem bronchus was obtained at this time.  Patient has been evaluated by radiation oncology for empiric start of treatment.  I agree with starting radiation therapy at this time and if no definitive biopsy is obtained, we will have to defer systemic therapy at this time.  She will require a different tissue sampling whether its  percutaneous to be done at a later date if any other tumor arise versus a repeat bronchoscopy and a biopsy while she is in the ICU.  We will continue to follow her progress and assist in her care in any way possible  2.  Respiratory failure: She remains intubated and managed by the pulmonary and critical care medicine team.   LOS: 7 days   Zola Button 02/07/2017, 7:41 AM

## 2017-02-07 NOTE — Progress Notes (Addendum)
Nutrition Follow-up  DOCUMENTATION CODES:   Not applicable  INTERVENTION:  - Will change TF regimen to Vital AF 1.2 @ 45 mL/hr with 30 mL Prostat BID. This regimen will provide 1496 kcal (95% re-estimated kcal need), 111 grams of protein, and 876 mL free water.  - PEPuP protocol. - Will continue to closely monitor Propofol changes.   NUTRITION DIAGNOSIS:   Inadequate oral intake related to inability to eat as evidenced by NPO status. -ongoing  GOAL:   Patient will meet greater than or equal to 90% of their needs -met with TF regimen.   MONITOR:   Vent status, TF tolerance, Weight trends, Labs  ASSESSMENT:   76 y.o. female with history of depression, benign meningioma status post resection, HTN. She came to the hospital with intermittent SOB for the past 1 month.  Patient says that SOB is worse on exertion. She was seen on 10/24 in the ED and prescribed azithromycin and albuterol for acute bronchitis. The patient says that lung congestion symptoms improved with antibiotics but she continued to have wheezing with dyspnea on exertion.  11/28 Pt remains intubated with OGT in place. RN reports that night shift RN was unaware of not needing to check residuals. Residuals checked overnight and found to be 400cc so RN decreased Vital High Protein from 45 mL/hr to 20 mL/hr. This AM, RN increased back to 45 mL/hr (currently infusing) but did not follow PEPuP guidelines given concern about high residual last night. RD feels this is appropriate and will continue to monitor. RN reports that Reglan has been ordered. Weight remains stable. Pt went to CT simulation for radiation yesterday evening. Plan for radiation today at 4:00 PM.   Patient is currently intubated on ventilator support MV: 8 L/min Temp (24hrs), Avg:97.8 F (36.6 C), Min:97.3 F (36.3 C), Max:98.5 F (36.9 C) Propofol: none at this time BP: 123/55 and MAP: 76  Medications reviewed; sliding scale Novolog, 5 mg Protonix per OGT  BID, 400 units vitamin E per OGT/day, 5 mg IV Reglan TID.  Labs reviewed; CBGs: 102, 110, and 127 mg/dL this AM, BUN: 36 mg/dL.   IVF: NS @ 50 mL/hr. Drip: Fentanyl @ 150 mcg/hr.    11/27 - Pt was previously on Regular diet and eating mainly 100% since admission.  - Daughter and husband at bedside and confirm pt had a good appetite. - Daughter states "I think she has good muscle tone for her age."  - Per Dr. Janit Pagan note this AM, pt with event of apnea and near arrest.  - Pt intubated, sedated at this time with OGT in place. - MD note states possible need for repeat bronch  Patient is currently intubated on ventilator support MV: 8.2 L/min Temp (24hrs), Avg:97.9 F (36.6 C), Min:97.5 F (36.4 C), Max:98.2 F (36.8 C) Propofol: 15.8 ml/hr (417 kcal) BP: 71/42 and MAP: 51 IVF: NS @ 50 mL/hr.  Drip: Propofol @ 30 mcg/kg/min.      Diet Order:  Diet NPO time specified  EDUCATION NEEDS:   No education needs have been identified at this time  Skin:  Skin Assessment: Reviewed RN Assessment  Last BM:  11/26  Height:   Ht Readings from Last 1 Encounters:  02/06/17 5' 9" (1.753 m)    Weight:   Wt Readings from Last 1 Encounters:  02/06/17 194 lb (88 kg)    Ideal Body Weight:  65.91 kg  BMI:  Body mass index is 28.65 kg/m.  Estimated Nutritional Needs:   Kcal:  1571  Protein:  106-132 grams (1.2-1.5 grams/kg)  Fluid:  >/= 1.7 L/day      Jarome Matin, MS, RD, LDN, [facility] Inpatient Clinical Dietitian Pager # [phone] After hours/weekend pager # [phone]

## 2017-02-07 NOTE — Telephone Encounter (Signed)
Called and spoke with [facility] for 1225 pataient, per RN no bronch today, will ask therapist on [facility] [address] if patient can be moved up earlier than 5pm today, will call RN back with status, informed [facility] Supervisor Rad therapist  11:56 AM

## 2017-02-07 NOTE — Progress Notes (Signed)
called Earma Reading, RN for bed 125, we have moved up patient rad tx time to 2:30pm today Ruby RN will call respiratory to coordinate with them when she brings patient to radiation dept, Nicki Reaper transporter will take portable suction machine with him for the patient traveling from 1225 to [facility]3, thanked Rn  informed Shona Simpson [facility] and [facility] as well 12:10 PM

## 2017-02-07 NOTE — Progress Notes (Signed)
PULMONARY / CRITICAL CARE MEDICINE   Name: Cassidy Sanders MRN: [id number] DOB: Jun 26, 1940    ADMISSION DATE:  01/31/2017 CONSULTATION DATE:  02/01/17  REFERRING MD:  Cassidy Sanders / Cassidy Sanders  CHIEF COMPLAINT:  Sob/wheeze  HISTORY OF PRESENT ILLNESS:   30 yowf quit smoking 4 y ago without apparent sequelae admitted 11/21 with progressive doe with audible wheeze x 1 month MASS RT with compression, SVC syndrome.    SUBJECTIVE: She is post Ebus without definitive diagnosis.  She will begin radiation therapy to shrink tumor before any idea of extubation can be entertained.  VITAL SIGNS: BP (!) 145/66   Pulse 72   Temp 97.6 F (36.4 C) (Axillary)   Resp 15   Ht 5\' 9"  (1.753 m)   Wt 194 lb (88 kg)   SpO2 97%   BMI 28.65 kg/m   HEMODYNAMICS:    VENTILATOR SETTINGS: Vent Mode: PRVC FiO2 (%):  [40 %-100 %] 40 % Set Rate:  [14 bmp-16 bmp] 16 bmp Vt Set:  [530 mL] 530 mL PEEP:  [5 cmH20-8 cmH20] 8 cmH20 Plateau Pressure:  [18 cmH20-28 cmH20] 22 cmH20  INTAKE / OUTPUT: I/O last 3 completed shifts: In: 2562.3 [P.O.:420; I.V.:1784.8; NG/GT:357.5] Out: 1300 [Urine:1300]  PHYSICAL EXAMINATION: General: No acute distress on ventilator.  Well-nourished well developed. HEENT: Endotracheal tube connected to ventilator.  Gastric tube in place with tube feedings at trickle rate due to recent high tube feed residuals. PSY: Upbeat affect Neuro: Grossly intact CV: Heart sounds are regular regular rate and rhythm PULM: Decreased air movement FG:HWEX, non-tender, bsx4 active  Extremities: warm/dry, 1+ edema  Skin: no rashes or lesions    LABS:  BMET Recent Labs  Lab 02/05/17 0416 02/06/17 0353 02/07/17 0601  NA 141 142 142  K 4.0 3.6 3.9  CL 108 107 108  CO2 28 28 27   BUN 17 22* 36*  CREATININE 0.78 0.69 0.81  GLUCOSE 152* 149* 127*    Electrolytes Recent Labs  Lab 02/05/17 0416  02/06/17 0353 02/06/17 0910 02/06/17 1624 02/07/17 0601  CALCIUM 8.8*  --  9.4  --   --  9.1   MG  --    < > 2.3 2.2 2.4 2.4  PHOS  --    < > 3.3 4.5 4.6 4.0   < > = values in this interval not displayed.    CBC Recent Labs  Lab 02/05/17 0416 02/06/17 0353 02/07/17 0601  WBC 10.9* 9.6 14.0*  HGB 12.7 13.0 13.5  HCT 40.2 40.7 42.7  PLT 150 160 174    Coag's Recent Labs  Lab 02/05/17 1101  APTT 25  INR 0.99    Sepsis Markers No results for input(s): LATICACIDVEN, PROCALCITON, O2SATVEN in the last 168 hours.  ABG Recent Labs  Lab 02/06/17 0958  PHART 7.330*  PCO2ART 54.9*  PO2ART 119*    Liver Enzymes Recent Labs  Lab 01/31/17 1309 02/01/17 0409 02/07/17 0601  AST 18 28 22   ALT 17 19 20   ALKPHOS 76 85 67  BILITOT 1.1 0.7 0.6  ALBUMIN 3.6 3.7 3.8    Cardiac Enzymes Recent Labs  Lab 01/31/17 1309 02/06/17 0910  TROPONINI <0.03 <0.03    Glucose Recent Labs  Lab 02/06/17 1713 02/06/17 2001 02/06/17 2357 02/07/17 0351 02/07/17 0507 02/07/17 0734  GLUCAP 131* 122* 129* 102* 110* 127*    Imaging Dg Chest Port 1 View  Result Date: 02/07/2017 CLINICAL DATA:  76 y/o female status post fiberoptic bronchoscopy with endobronchial ultrasound and biopsies  of right hilar lung mass yesterday. EXAM: PORTABLE CHEST 1 VIEW COMPARISON:  04/08/2016 and earlier. FINDINGS: Portable AP semi upright view at 0448 hours. Stable endotracheal tube tip at the level the clavicles. Enteric tube courses to the abdomen, tip not included. Stable lung volumes. Decreased veiling opacity at the right lung base which may have been related to bronchial alveolar lavage yesterday. Stable abnormal right mediastinal and hilar contour. No pneumothorax. Stable pulmonary vascularity without overt edema. No new pulmonary opacity. IMPRESSION: 1.  Stable lines and tubes. 2. Status post bronchoscopy and biopsies of right hilar lung mass yesterday with no adverse features or new cardiopulmonary abnormality. Electronically Signed   By: Genevie Ann M.D.   On: 02/07/2017 07:09   Dg Chest Port 1  View  Result Date: 02/06/2017 CLINICAL DATA:  76 year old female with acute respiratory failure. Subsequent encounter. EXAM: PORTABLE CHEST 1 VIEW COMPARISON:  02/05/2017 chest x-ray. FINDINGS: Endotracheal tube tip 3.7 cm above the carina. Nasogastric tube courses below the diaphragm. Tip is not included on the present exam. Right hilar mass. Interval development of consolidation right lung base may represent obstructive atelectasis/infiltrate given the presence of right hilar mass. Mild pulmonary vascular prominence superimposed upon chronic lung changes. Cardiomegaly. Calcified aorta. IMPRESSION: Right hilar mass. Interval development of consolidation right lung base may represent obstructive atelectasis/infiltrate given the presence of right hilar mass causing bronchial narrowed. Cardiomegaly. Endotracheal tube tip 3.7 cm above the carina. Electronically Signed   By: Genia Del M.D.   On: 02/06/2017 12:14   Dg Abd Portable 1v  Result Date: 02/06/2017 CLINICAL DATA:  Orogastric tube placement. EXAM: PORTABLE ABDOMEN - 1 VIEW COMPARISON:  Radiograph of same day. FINDINGS: The bowel gas pattern is normal. Distal tip of orogastric tube is seen expected position of distal stomach. No radio-opaque calculi or other significant radiographic abnormality are seen. IMPRESSION: Distal tip of orogastric tube seen in expected position of distal stomach. No evidence of bowel obstruction or ileus. Electronically Signed   By: Marijo Conception, M.D.   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  CT evaluation demonstrated a large (6x5 x 5.5 x 5.8 cm) paratracheal mediastinal mass causing narrowing of the SVC and SVC syndrome that results in collaterals.  In addition, she also has small pulmonary nodules (<52mm).  Currently the plan is for radiation therapy to shrink mass.  She may need  further invasive biopsies in the near future.  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  Plan for radiation therapy 02/07/2017 at 1600 hrs. No plan for extubation until she said radiation to shrink tumor. She is very comfortable mechanical ventilatory support at this time will sedate as needed.    CVS: Currently off sedation currently hemodynamically stable -Hemodynamic monitoring -Follow troponins with last troponin on 02/06/2017 is less than 0.03  RENAL: Lab Results  Component Value Date   CREATININE 0.81 02/07/2017   CREATININE 0.69 02/06/2017   CREATININE 0.78 02/05/2017   Recent Labs  Lab 02/05/17 0416 02/06/17 0353 02/07/17 0601  K 4.0 3.6 3.9    Preload, svc -saline -chem as needed  GI: resp failure, no extubation planned -start feeds.  01/30/2017 tube feedings at  a trickle rate regimen starting. -ppi to oral -Gastric tube in place  ENDO: -cbg, on steroids -Decadron to remain with obstruction    HEME Recent Labs    02/06/17 0353 02/07/17 0601  HGB 13.0 13.5    Hemoptysis secondary to Mediastinal Mass P: No heparin scd Cbc in as needed  ID: No infection present, at risk post obstructive PNA -monitor temp curve -monitor pcxr and secretions   1128 2016-10-09 no family at bedside.    Richardson Landry Minor ACNP Maryanna Shape PCCM Pager [phone] till 1 pm If no answer page [phone] 02/07/2017, 8:09 AM   STAFF NOTE: I, Merrie Roof, MD FACP have personally reviewed patient's available data, including medical history, events of note, physical examination and test results as part of my evaluation. I have discussed with resident/NP and other care providers such as pharmacist, RN and RRT. In addition, I personally evaluated patient and elicited key findings of: awake  communicates well, swollen neck rt, wheezing bilateral, abdo soft, no edema, pcxr which I reviewed shows hialr mass rt unchanged, and improved ATX rt base noted post procedure 11/27, secretions are low, peep is at 8 , goal to 5 with fio2 40% first given yesterdays ATX, if atx reoccurs then mucomysts, chest pt, pcxr in am , wean PS 5-10, cpap 8, goal 2 hours, no extubation planned, for radiation today, consider bronch bx in am on vent, was pos 842 cc, consider even balance, ct slight rise, no lasix, cbg while on decadron, keep decadron, after bronch bx  I would be happy to extubate in middle of radiation treatments as affect will be days [place] and she was on min O2 and no distress pre bronch, WUA fent, she had straight cth x 2, if this continues will need foley placed, I updated daughter and pt in room The patient is critically ill with multiple organ systems failure and requires high complexity decision making for assessment and support, frequent evaluation and titration of therapies, application of advanced monitoring technologies and extensive interpretation of multiple databases.   Critical Care Time devoted to patient care services described in this note is 35 Minutes. This time reflects time of care of this signee: Merrie Roof, MD FACP. This critical care time does not reflect procedure time, or teaching time or supervisory time of PA/NP/Med student/Med Resident etc but could involve care discussion time. Rest per NP/medical resident whose note is outlined above and that I agree with   Lavon Paganini. Titus Mould, MD, [place] Pgr: [facility] 02/07/2017 10:49 AM

## 2017-02-08 ENCOUNTER — Telehealth: Payer: Self-pay | Admitting: *Deleted

## 2017-02-08 ENCOUNTER — Encounter (HOSPITAL_COMMUNITY): Payer: Self-pay | Admitting: Pulmonary Disease

## 2017-02-08 ENCOUNTER — Inpatient Hospital Stay (HOSPITAL_COMMUNITY): Payer: Medicare Other

## 2017-02-08 ENCOUNTER — Ambulatory Visit
Admit: 2017-02-08 | Discharge: 2017-02-08 | Disposition: A | Discharge status: Home / Self Care | Payer: Medicare Other | Attending: Radiation Oncology | Admitting: Radiation Oncology

## 2017-02-08 DIAGNOSIS — R1010 Upper abdominal pain, unspecified: Secondary | ICD-10-CM

## 2017-02-08 LAB — BLOOD GAS, ARTERIAL
Acid-Base Excess: 2.7 mmol/L — ABNORMAL HIGH (ref 0.0–2.0)
Bicarbonate: 29.8 mmol/L — ABNORMAL HIGH (ref 20.0–28.0)
DRAWN BY: 422461
FIO2: 50
O2 SAT: 93.3 %
PATIENT TEMPERATURE: 97.9
PCO2 ART: 58.7 mmHg — AB (ref 32.0–48.0)
PEEP: 8 cmH2O
PH ART: 7.323 — AB (ref 7.350–7.450)
PO2 ART: 72 mmHg — AB (ref 83.0–108.0)
RATE: 16 resp/min
VT: 530 mL

## 2017-02-08 LAB — BASIC METABOLIC PANEL
ANION GAP: 5 (ref 5–15)
BUN: 35 mg/dL — ABNORMAL HIGH (ref 6–20)
CALCIUM: 8.6 mg/dL — AB (ref 8.9–10.3)
CO2: 29 mmol/L (ref 22–32)
CREATININE: 0.67 mg/dL (ref 0.44–1.00)
Chloride: 108 mmol/L (ref 101–111)
GLUCOSE: 164 mg/dL — AB (ref 65–99)
Potassium: 4 mmol/L (ref 3.5–5.1)
Sodium: 142 mmol/L (ref 135–145)

## 2017-02-08 LAB — GLUCOSE, CAPILLARY
GLUCOSE-CAPILLARY: 154 mg/dL — AB (ref 65–99)
GLUCOSE-CAPILLARY: 113 mg/dL — AB (ref 65–99)
GLUCOSE-CAPILLARY: 77 mg/dL (ref 65–99)

## 2017-02-08 LAB — MAGNESIUM: Magnesium: 2.5 mg/dL — ABNORMAL HIGH (ref 1.7–2.4)

## 2017-02-08 LAB — PHOSPHORUS: Phosphorus: 2.8 mg/dL (ref 2.5–4.6)

## 2017-02-08 MED ORDER — PANTOPRAZOLE SODIUM 40 MG PO PACK
40.0000 mg | PACK | Freq: Two times a day (BID) | ORAL | Status: DC
  Administered 2017-02-08 (×2): 40 mg
  Filled 2017-02-08 (×2): qty 20

## 2017-02-08 MED ORDER — FUROSEMIDE 10 MG/ML IJ SOLN
40.0000 mg | Freq: Two times a day (BID) | INTRAMUSCULAR | Status: DC
  Administered 2017-02-08 – 2017-02-09 (×3): 40 mg via INTRAVENOUS
  Filled 2017-02-08 (×3): qty 4

## 2017-02-08 MED ORDER — DEXAMETHASONE SODIUM PHOSPHATE 4 MG/ML IJ SOLN
4.0000 mg | Freq: Three times a day (TID) | INTRAMUSCULAR | Status: DC
  Administered 2017-02-08 – 2017-02-09 (×3): 4 mg via INTRAVENOUS
  Filled 2017-02-08 (×3): qty 1

## 2017-02-08 MED ORDER — ENOXAPARIN SODIUM 40 MG/0.4ML ~~LOC~~ SOLN
40.0000 mg | SUBCUTANEOUS | Status: DC
  Administered 2017-02-08 – 2017-02-12 (×5): 40 mg via SUBCUTANEOUS
  Filled 2017-02-08 (×5): qty 0.4

## 2017-02-08 NOTE — Telephone Encounter (Signed)
Called  ICU 125 spoke with RN Reuben Likes, no bronch scheduled per her knowledge today, pa9ient rad tx time is at noon, asjked if she would let resp therapist know to come at that time or should I call to coordinate this?, she stated that she would let her know since he is there now, she will come wioth the patient and Nicki Reaper will bring the portable suction with him to get the patient, thanked RN her number is  [phone] lianc #3 informed Melanie rt therpist 8:15 AM

## 2017-02-08 NOTE — Procedures (Signed)
Extubation Procedure Note  Patient Details:   Name: Cassidy Sanders DOB: [birth year] MRN: [id number]   Airway Documentation:  Airway 8.5 mm (Active)  Secured at (cm) 23 cm 02/08/2017  7:52 AM  Measured From Lips 02/08/2017  7:52 AM  Secured Location Right 02/08/2017  7:52 AM  Secured By [employer] 02/08/2017  7:52 AM  Tube Holder Repositioned Yes 02/08/2017  7:52 AM  Cuff Pressure (cm H2O) 24 cm H2O 02/08/2017  7:52 AM  Site Condition Dry 02/08/2017  8:00 AM    Evaluation  O2 sats: 91 Complications: none Patient tolerated procedure well. Bilateral Breath Sounds: Diminished, Expiratory wheezes   Pt able to speak  Per CCM order, pt extubated and placed on 50% venti mask.  Pt tolerated well, no complications.  Martha Clan 02/08/2017, 11:02 AM

## 2017-02-08 NOTE — Evaluation (Signed)
SLP Cancellation Note  Patient Details Name: Cassidy Sanders MRN: [id number] DOB: 05/31/1940   Cancelled treatment:       Reason Eval/Treat Not Completed: Other (comment)(pt at Radiation at this time, will continue efforts)   Claudie Fisherman, [place] [facility] SLP [phone]

## 2017-02-08 NOTE — Progress Notes (Signed)
PULMONARY / CRITICAL CARE MEDICINE   Name: Cassidy Sanders MRN: [id number] DOB: 09-Jan-1941    ADMISSION DATE:  01/31/2017 CONSULTATION DATE:  02/01/17  REFERRING MD:  Sheliah Plane / Frederich Chick  CHIEF COMPLAINT:  Sob/wheeze  HISTORY OF PRESENT ILLNESS:   15 yowf quit smoking 4 y ago without apparent sequelae admitted 11/21 with progressive doe with audible wheeze x 1 month.  Found to have a large right paratracheal mass with compression, SVC syndrome.    SUBJECTIVE:  Pt indicates she is anxious to have her ETT removed.  No acute events overnight. Planned for radiation at noon.   VITAL SIGNS: BP (!) 181/84 (BP Location: Left Arm)   Pulse 79   Temp 97.9 F (36.6 C) (Oral)   Resp 13   Ht 5\' 9"  (1.753 m)   Wt 202 lb 13.2 oz (92 kg)   SpO2 91%   BMI 29.95 kg/m   HEMODYNAMICS:    VENTILATOR SETTINGS: Vent Mode: PRVC FiO2 (%):  [40 %-50 %] 50 % Set Rate:  [16 bmp] 16 bmp Vt Set:  [530 mL] 530 mL PEEP:  [8 cmH20] 8 cmH20 Pressure Support:  [5 cmH20-8 cmH20] 8 cmH20 Plateau Pressure:  [21 cmH20-31 cmH20] 21 cmH20  INTAKE / OUTPUT: I/O last 3 completed shifts: In: 3653.8 [I.V.:2357.5; NG/GT:1296.3] Out: 1750 [Urine:1750]  PHYSICAL EXAMINATION: General: elderly female in NAD HEENT: MM pink/moist, ETT, neck/arm swelling  PSY: calm/appropriate Neuro: awake, alert, interactive / follows commands, appears appropriate  CV: s1s2 rrr, no m/r/g PULM: even/non-labored, lungs bilaterally with wheeze FI:EPPI, non-tender, bsx4 active  Extremities: warm/dry, neck/arm edema  Skin: no rashes or lesions  LABS:  BMET Recent Labs  Lab 02/06/17 0353 02/07/17 0601 02/08/17 0322  NA 142 142 142  K 3.6 3.9 4.0  CL 107 108 108  CO2 28 27 29   BUN 22* 36* 35*  CREATININE 0.69 0.81 0.67  GLUCOSE 149* 127* 164*    Electrolytes Recent Labs  Lab 02/06/17 0353  02/07/17 0601 02/07/17 1645 02/08/17 0322  CALCIUM 9.4  --  9.1  --  8.6*  MG 2.3   < > 2.4 2.3 2.5*  PHOS 3.3   < > 4.0 3.4  2.8   < > = values in this interval not displayed.    CBC Recent Labs  Lab 02/05/17 0416 02/06/17 0353 02/07/17 0601  WBC 10.9* 9.6 14.0*  HGB 12.7 13.0 13.5  HCT 40.2 40.7 42.7  PLT 150 160 174    Coag's Recent Labs  Lab 02/05/17 1101  APTT 25  INR 0.99    Sepsis Markers No results for input(s): LATICACIDVEN, PROCALCITON, O2SATVEN in the last 168 hours.  ABG Recent Labs  Lab 02/06/17 0958 02/08/17 0413  PHART 7.330* 7.323*  PCO2ART 54.9* 58.7*  PO2ART 119* 72.0*    Liver Enzymes Recent Labs  Lab 02/07/17 0601  AST 22  ALT 20  ALKPHOS 67  BILITOT 0.6  ALBUMIN 3.8    Cardiac Enzymes Recent Labs  Lab 02/06/17 0910  TROPONINI <0.03    Glucose Recent Labs  Lab 02/07/17 1217 02/07/17 1613 02/07/17 1926 02/07/17 2334 02/08/17 0314 02/08/17 0800  GLUCAP 92 110* 135* 137* 154* 113*    Imaging Dg Chest Port 1 View  Result Date: 02/08/2017 CLINICAL DATA:  Respiratory failure. EXAM: PORTABLE CHEST 1 VIEW COMPARISON:  02/07/2017 FINDINGS: The endotracheal tube remains in good position, 3.8 cm above the carina. The NG tube is coursing down the esophagus and into the stomach. Stable mild cardiac enlargement  and moderate tortuosity and ectasia of the thoracic aorta. Stable right hilar mass. Mild stable interstitial changes. IMPRESSION: Stable support apparatus. Stable right hilar mass and chronic interstitial changes. Electronically Signed   By: Marijo Sanes M.D.   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  CT evaluation demonstrated a large (6x5 x 5.5 x 5.8 cm) paratracheal mediastinal mass causing  narrowing of the SVC and SVC syndrome that results in collaterals.  In addition, she also has small pulmonary nodules (<34mm).  Brushings consistent with non-small cell.  Currently the plan is for radiation therapy to shrink mass.  She may need further invasive biopsies in the near future.  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 Acute Hypoxic Respiratory Failure secondary to above  Hemoptysis  NON-Small cell Carcinoma  P: Decadron 8 mg IV Q8 PRVC 8 cc/kg  Wean PEEP / FiO2 for sats > 90% Intermittent CXR Duoneb TID  Radiation ONC following, appreciate input > plan for radiation therapy 11/29 at noon  No plan for extubation until FiO2 needs improved   CARDIOVASCULAR: A: Hypertension  P: ICU monitoring  Hydralazine 10 mg PO Q8 Bisoprolol 5mg  QD  RENAL: A: At Risk AKI  P: Trend BMP / urinary output Replace electrolytes as indicated Avoid nephrotoxic agents, ensure adequate renal perfusion NS @ 50 ml/hr  GI: A: At Risk Protein Calorie Malnutrition  P: TF  PPI  OGT  NPO  ENDOCRINE: A: Hyperglycemia  P: Continue steroids for airway SSI  Monitor glucose on BMP   HEMEATOLOGY A: Hemoptysis secondary to Mediastinal Mass P: Trend CBC  No heparin given hemoptysis  SCD's   ID: At risk post obstructive PNA P: Monitor fever curve, secretions, WBC trend   NEUROLOGICAL: A: Anxiety / Depression  P: Continue Zoloft  Fentanyl gtt for pain / sedation   CC Time: 30 minutes  Noe Gens, NP-C [facility] Pgr: [phone] or if no answer [phone] 02/08/2017, 8:21 AM  STAFF NOTE: I, Merrie Roof, MD FACP have personally reviewed patient's available data, including medical history, events of note, physical examination and test results as part of my evaluation. I have discussed with resident/NP and other care providers such as pharmacist, RN and RRT. In  addition, I personally evaluated patient and elicited key findings of: awake, alert, fc well, normal WOB on vent, ett, jvd wnl, reduced rt , mild exp wheezing bilateral, abdo soft, BS wnl, min edema, was pos 2.2 liters, pcxr whici I reviewed shows int prom, mass stable rt hilum, ett wnl, has some increased edema and she feels swollen, I wpoke to family last night at bedside and explained dx non small cell, no further hemotysis, reduce decadron slight then to q12h likely in am, for radiation troday, wean cpap 5 ps 5, even if on 60% assess RSBI, would extubate with normal WOB, and good volumes, rr even if some hypoxia, extubate goal to high flow o2 if needed, start lasix, renal fxn improved should be able to handle, Hold TF for weaning, avoid checking  residuals as able, no bleeding for over 2 days, high risk DVT with cancer on vent, add lovenox sub q, will cal med onc today, fent WUA to off as able, I updated family in full The patient is critically ill with multiple organ systems failure and requires high complexity decision making for assessment and support, frequent evaluation and titration of therapies, application of advanced monitoring technologies and extensive interpretation of multiple databases.   Critical Care Time devoted to patient care services described in this note is 35 Minutes. This time reflects time of care of this signee: Merrie Roof, MD FACP. This critical care time does not reflect procedure time, or teaching time or supervisory time of PA/NP/Med student/Med Resident etc but could involve care discussion time. Rest per NP/medical resident whose note is outlined above and that I agree with   Cassidy Paganini. Titus Mould, MD, [place] Pgr: [facility] 02/08/2017 10:09 AM

## 2017-02-09 ENCOUNTER — Ambulatory Visit
Admit: 2017-02-09 | Discharge: 2017-02-09 | Disposition: A | Discharge status: Home / Self Care | Payer: Medicare Other | Attending: Radiation Oncology | Admitting: Radiation Oncology

## 2017-02-09 ENCOUNTER — Inpatient Hospital Stay (HOSPITAL_COMMUNITY): Payer: Medicare Other

## 2017-02-09 LAB — BASIC METABOLIC PANEL
Anion gap: 6 (ref 5–15)
BUN: 32 mg/dL — AB (ref 6–20)
CALCIUM: 9.3 mg/dL (ref 8.9–10.3)
CO2: 37 mmol/L — ABNORMAL HIGH (ref 22–32)
CREATININE: 0.7 mg/dL (ref 0.44–1.00)
Chloride: 102 mmol/L (ref 101–111)
GFR calc Af Amer: >60 mL/min (ref >60)
GLUCOSE: 115 mg/dL — AB (ref 65–99)
Potassium: 3.5 mmol/L (ref 3.5–5.1)
SODIUM: 145 mmol/L (ref 135–145)

## 2017-02-09 LAB — CBC
HCT: 42.6 % (ref 36.0–46.0)
Hemoglobin: 13.2 g/dL (ref 12.0–15.0)
MCH: 29.1 pg (ref 26.0–34.0)
MCHC: 31 g/dL (ref 30.0–36.0)
MCV: 93.8 fL (ref 78.0–100.0)
PLATELETS: 151 10*3/uL (ref 150–400)
RBC: 4.54 MIL/uL (ref 3.87–5.11)
RDW: 14.5 % (ref 11.5–15.5)
WBC: 12.5 10*3/uL — ABNORMAL HIGH (ref 4.0–10.5)

## 2017-02-09 LAB — GLUCOSE, CAPILLARY
GLUCOSE-CAPILLARY: 121 mg/dL — AB (ref 65–99)
GLUCOSE-CAPILLARY: 139 mg/dL — AB (ref 65–99)
GLUCOSE-CAPILLARY: 87 mg/dL (ref 65–99)
Glucose-Capillary: 107 mg/dL — ABNORMAL HIGH (ref 65–99)
Glucose-Capillary: 79 mg/dL (ref 65–99)

## 2017-02-09 MED ORDER — LABETALOL HCL 5 MG/ML IV SOLN: 10.0000 mg | Freq: Four times a day (QID) | INTRAVENOUS | Status: DC | PRN

## 2017-02-09 MED ORDER — DEXAMETHASONE SODIUM PHOSPHATE 4 MG/ML IJ SOLN
4.0000 mg | Freq: Two times a day (BID) | INTRAMUSCULAR | Status: DC
  Administered 2017-02-09 – 2017-02-11 (×4): 4 mg via INTRAVENOUS
  Filled 2017-02-09 (×4): qty 1

## 2017-02-09 MED ORDER — POTASSIUM CHLORIDE CRYS ER 20 MEQ PO TBCR
20.0000 meq | EXTENDED_RELEASE_TABLET | ORAL | Status: AC
  Administered 2017-02-09 (×2): 20 meq via ORAL
  Filled 2017-02-09 (×2): qty 1

## 2017-02-09 MED ORDER — FUROSEMIDE 10 MG/ML IJ SOLN
20.0000 mg | Freq: Two times a day (BID) | INTRAMUSCULAR | Status: DC
  Administered 2017-02-09 – 2017-02-10 (×2): 20 mg via INTRAVENOUS
  Filled 2017-02-09 (×2): qty 2

## 2017-02-09 MED ORDER — LORAZEPAM 0.5 MG PO TABS: 0.5000 mg | ORAL_TABLET | Freq: Two times a day (BID) | ORAL | Status: DC | PRN

## 2017-02-09 MED ORDER — HYDRALAZINE HCL 25 MG PO TABS
25.0000 mg | ORAL_TABLET | Freq: Three times a day (TID) | ORAL | Status: DC
  Administered 2017-02-09 – 2017-02-12 (×10): 25 mg via ORAL
  Filled 2017-02-09 (×10): qty 1

## 2017-02-09 MED ORDER — LABETALOL HCL 5 MG/ML IV SOLN
10.0000 mg | Freq: Four times a day (QID) | INTRAVENOUS | Status: DC | PRN
  Filled 2017-02-09: qty 4

## 2017-02-09 MED ORDER — METOPROLOL TARTRATE 25 MG PO TABS
25.0000 mg | ORAL_TABLET | Freq: Two times a day (BID) | ORAL | Status: DC
  Administered 2017-02-09 – 2017-02-12 (×7): 25 mg via ORAL
  Filled 2017-02-09 (×7): qty 1

## 2017-02-09 MED ORDER — PANTOPRAZOLE SODIUM 40 MG PO TBEC
40.0000 mg | DELAYED_RELEASE_TABLET | Freq: Two times a day (BID) | ORAL | Status: DC
  Administered 2017-02-09 (×2): 40 mg via ORAL
  Filled 2017-02-09 (×2): qty 1

## 2017-02-09 NOTE — Progress Notes (Signed)
[facility] ADULT ICU REPLACEMENT PROTOCOL FOR AM LAB REPLACEMENT ONLY  The patient does apply for the [facility] Adult ICU Electrolyte Replacment Protocol based on the criteria listed below:   1. Is GFR >/= 40 ml/min? Yes.    Patient's GFR today is >60 2. Is urine output >/= 0.5 ml/kg/hr for the last 6 hours? Yes.   Patient's UOP is 0.9 ml/kg/hr 3. Is BUN < 60 mg/dL? Yes.    Patient's BUN today is 32 4. Abnormal electrolyte(s): 3.5 5. Ordered repletion with: per protocol 6. If a panic level lab has been reported, has the CCM MD in charge been notified? Yes.  .   Physician:  Dr. Kirke Corin, Philis Nettle 02/09/2017 5:54 AM

## 2017-02-09 NOTE — Progress Notes (Signed)
Nutrition Follow-up  DOCUMENTATION CODES:   Not applicable  INTERVENTION:  - Continue to encourage PO intakes.  NUTRITION DIAGNOSIS:   Increased nutrient needs related to catabolic illness, cancer and cancer related treatments as evidenced by estimated needs. -revised.   GOAL:   Patient will meet greater than or equal to 90% of their needs -unmet at this time.   MONITOR:   PO intake, Diet advancement, Weight trends, Labs  ASSESSMENT:   76 y.o. female with history of depression, benign meningioma status post resection, HTN. She came to the hospital with intermittent SOB for the past 1 month.  Patient says that SOB is worse on exertion. She was seen on 10/24 in the ED and prescribed azithromycin and albuterol for acute bronchitis. The patient says that lung congestion symptoms improved with antibiotics but she continued to have wheezing with dyspnea on exertion.  11/30 Pt was extubated yesterday and OGT removed at that time. Diet was advanced from NPO to Moody yesterday at 5:38 PM and from FLD to Regular today at 10:23 AM. Pt reports having yogurt, Italian ice, and sipping on Coke for breakfast this AM. She denies abdominal pain or nausea with PO intakes. She feels very confident that she will be able to eat well as she had been prior to intubation--she was previously eating 100% of meals on average. Will continue to monitor intakes and if an oral nutrition supplement is needed. Weight remains stable. Pt to go for radiation shortly.   Medications reviewed; 20 mg IV Lasix BID, sliding scale Novolog, 5 mg IV Reglan TID, 40 mg oral Protonix BID, 20 mEq oral KCl x2 doses today, 400 units vitamin E/day.  Labs reviewed; CBGs: 139 and 121 mg/dL this AM, BUN: 32 mg/dL.     11/28 - Pt remains intubated with OGT in place. - RN reports that night shift RN was unaware of not needing to check residuals.  - Residuals checked overnight and found to be 400cc so RN decreased Vital High Protein from  45 mL/hr to 20 mL/hr. - This AM, RN increased back to 45 mL/hr (currently infusing).  - RN reports that Reglan has been ordered.  - Pt went to CT simulation for radiation yesterday evening.  - Plan for radiation today at 4:00 PM.   Patient is currently intubated on ventilator support MV: 8 L/min Temp (24hrs), Avg:97.8 F (36.6 C), Min:97.3 F (36.3 C), Max:98.5 F (36.9 C) Propofol: none at this time BP: 123/55 and MAP: 76 IVF: NS @ 50 mL/hr. Drip: Fentanyl @ 150 mcg/hr.    11/27 - Pt was previously on Regular diet and eating mainly 100% since admission.  - Daughter and husband at bedside and confirm pt had a good appetite. - Daughter states "I think she has good muscle tone for her age."  - Per Dr. Janit Pagan note this AM, pt with event of apnea and near arrest.  - Pt intubated, sedated at this time with OGT in place. - MD note states possible need for repeat bronch  Patient is currently intubated on ventilator support MV:8.2L/min Temp (24hrs), Avg:97.9 F (36.6 C), Min:97.5 F (36.4 C), Max:98.2 F (36.8 C) Propofol:15.99ml/hr (417 kcal) BP: 71/42 and MAP: 51 IVF: NS @ 50 mL/hr.  Drip: Propofol @ 30 mcg/kg/min.     Diet Order:  Diet regular Room service appropriate? Yes; Fluid consistency: Thin  EDUCATION NEEDS:   No education needs have been identified at this time  Skin:  Skin Assessment: Reviewed RN Assessment  Last BM:  11/26  Height:   Ht Readings from Last 1 Encounters:  02/06/17 5\' 9"  (1.753 m)    Weight:   Wt Readings from Last 1 Encounters:  02/09/17 193 lb 12.6 oz (87.9 kg)    Ideal Body Weight:  65.91 kg  BMI:  Body mass index is 28.62 kg/m.  Estimated Nutritional Needs:   Kcal:  1571  Protein:  106-132 grams (1.2-1.5 grams/kg)  Fluid:  >/= 1.7 L/day      Jarome Matin, MS, RD, LDN, [facility] Inpatient Clinical Dietitian Pager # [phone] After hours/weekend pager # [phone]

## 2017-02-09 NOTE — Evaluation (Signed)
Physical Therapy Evaluation Patient Details Name: Cassidy Sanders MRN: [id number] DOB: 07-Dec-1940 Today's Date: 02/09/2017   History of Present Illness  76 year old female admitted 01/31/17 with progressive DOE with audible wheeze x 1 month and found to have a large right paratracheal mass with compression, SVC syndrome. ETT 11/27-11/29/18  Clinical Impression  Pt admitted with above diagnosis. Pt currently with functional limitations due to the deficits listed below (see PT Problem List).  Pt will benefit from skilled PT to increase their independence and safety with mobility to allow discharge to the venue listed below.   Pt very eager to ambulate in hallway.  Pt presenting with 3/4 dyspnea upon returning to room however reports "feeling fine."  Pt's SPo2 dropped to 87% on 3L O2 during ambulation.  Pt encouraged to progress activity slowly and self monitor breathing.  Pt would like to ambulate with nursing staff as able, agreeable for nursing staff to assist especially to monitor respiratory status and saturations.  Pt plans to return to her ILF [name] and agreeable to HHPT.     Follow Up Recommendations Home health PT;Supervision for mobility/OOB    Equipment Recommendations  Rolling walker with 5" wheels    Recommendations for Other Services       Precautions / Restrictions Precautions Precautions: Fall Precaution Comments: monitor O2 sats      Mobility  Bed Mobility               General bed mobility comments: pt up in recliner on arrival  Transfers Overall transfer level: Needs assistance Equipment used: Rolling walker (2 wheeled) Transfers: Sit to/from Stand Sit to Stand: Min assist         General transfer comment: initial assist to rise, verbal cues for hand placement  Ambulation/Gait Ambulation/Gait assistance: Min assist Ambulation Distance (Feet): 160 Feet Assistive device: Rolling walker (2 wheeled) Gait Pattern/deviations: Step-through  pattern;Decreased stride length     General Gait Details: verbal cues for safe use of RW, pt on 3L O2 [place] and SPO2 dropped to 87% upon returning to room, quickly improved to upper 90s with seated rest break, assist for LOB with initial ambulation, improved with further cues for UE support through [employer]            Wheelchair Mobility    Modified Rankin (Stroke Patients Only)       Balance Overall balance assessment: Needs assistance         Standing balance support: Bilateral upper extremity supported Standing balance-Leahy Scale: Poor Standing balance comment: requires UE support at this time                             Pertinent Vitals/Pain Pain Assessment: No/denies pain    Home Living   Living Arrangements: Spouse/significant other   Type of Home: Independent living facility         Home Equipment: None Additional Comments: spouse has post op shoe on his foot and needs RW, pt typically ambulates to dining hall    Prior Function Level of Independence: Independent               Hand Dominance        Extremity/Trunk Assessment        Lower Extremity Assessment Lower Extremity Assessment: Generalized weakness       Communication   Communication: No difficulties  Cognition Arousal/Alertness: Awake/alert Behavior During Therapy: WFL for tasks assessed/performed Overall Cognitive  Status: Within Functional Limits for tasks assessed                                        General Comments      Exercises     Assessment/Plan    PT Assessment Patient needs continued PT services  PT Problem List Decreased strength;Decreased mobility;Cardiopulmonary status limiting activity;Decreased balance;Decreased knowledge of use of DME;Decreased activity tolerance       PT Treatment Interventions Gait training;Therapeutic exercise;DME instruction;Therapeutic activities;Patient/family education;Functional mobility  training;Balance training    PT Goals (Current goals can be found in the Care Plan section)  Acute Rehab PT Goals Patient Stated Goal: get back to playing bridge PT Goal Formulation: With patient Time For Goal Achievement: 02/16/17 Potential to Achieve Goals: Good    Frequency Min 3X/week   Barriers to discharge        Co-evaluation               AM-PAC PT "6 Clicks" Daily Activity  Outcome Measure Difficulty turning over in bed (including adjusting bedclothes, sheets and blankets)?: A Little Difficulty moving from lying on back to sitting on the side of the bed? : A Little Difficulty sitting down on and standing up from a chair with arms (e.g., wheelchair, bedside commode, etc,.)?: A Little Help needed moving to and from a bed to chair (including a wheelchair)?: A Little Help needed walking in hospital room?: A Little Help needed climbing 3-5 steps with a railing? : A Lot 6 Click Score: 17    End of Session Equipment Utilized During Treatment: Gait belt;Oxygen Activity Tolerance: Patient tolerated treatment well Patient left: in chair;with call bell/phone within reach;with family/visitor present Nurse Communication: Mobility status PT Visit Diagnosis: Other abnormalities of gait and mobility (R26.89)    Time: 4388-8757 PT Time Calculation (min) (ACUTE ONLY): 25 min   Charges:   PT Evaluation $PT Eval Moderate Complexity: 1 Mod     PT G Codes:        Carmelia Bake, PT, DPT 02/09/2017 Pager: [phone]   York Ram E 02/09/2017, 3:49 PM

## 2017-02-09 NOTE — Progress Notes (Signed)
PULMONARY / CRITICAL CARE MEDICINE   Name: Cassidy Sanders MRN: [id number] DOB: 18-May-1940    ADMISSION DATE:  01/31/2017 CONSULTATION DATE:  02/01/17  REFERRING MD:  Sheliah Plane / Frederich Chick  CHIEF COMPLAINT:  Sob/wheeze  HISTORY OF PRESENT ILLNESS:   76 yowf quit smoking 4 y ago without apparent sequelae admitted 11/21 with progressive doe with audible wheeze x 1 month.  Found to have a large right paratracheal mass with compression, SVC syndrome.    SUBJECTIVE:  Hypoxia overnight BIPAP This am in chair , improved   Lasix, neg 3 liters  VITAL SIGNS: BP (!) 170/96   Pulse (!) 105   Temp (!) 97.4 F (36.3 C) (Oral)   Resp 19   Ht 5\' 9"  (1.753 m)   Wt 87.9 kg (193 lb 12.6 oz)   SpO2 92%   BMI 28.62 kg/m   HEMODYNAMICS:    VENTILATOR SETTINGS: Vent Mode: BIPAP;PCV FiO2 (%):  [40 %-100 %] 100 % Set Rate:  [10 bmp] 10 bmp PEEP:  [8 cmH20] 8 cmH20  INTAKE / OUTPUT: I/O last 3 completed shifts: In: 1919.5 [I.V.:1189.5; NG/GT:730] Out: 4025 [Urine:4025]  PHYSICAL EXAMINATION: General: awake, alert in chair, normal work of breathing oFF bipap Neuro: awake, alert, mild anxiety reported prior HEENT: jvd wnl PULM: ronchi mild coarse rt, wheezing exp mild rt CV: s1 s2 RRR not tachy GI: soft, BS wnl, no r Extremities:  Mild edema   LABS:  BMET Recent Labs  Lab 02/07/17 0601 02/08/17 0322 02/09/17 0329  NA 142 142 145  K 3.9 4.0 3.5  CL 108 108 102  CO2 27 29 37*  BUN 36* 35* 32*  CREATININE 0.81 0.67 0.70  GLUCOSE 127* 164* 115*    Electrolytes Recent Labs  Lab 02/07/17 0601 02/07/17 1645 02/08/17 0322 02/09/17 0329  CALCIUM 9.1  --  8.6* 9.3  MG 2.4 2.3 2.5*  --   PHOS 4.0 3.4 2.8  --     CBC Recent Labs  Lab 02/06/17 0353 02/07/17 0601 02/09/17 0329  WBC 9.6 14.0* 12.5*  HGB 13.0 13.5 13.2  HCT 40.7 42.7 42.6  PLT 160 174 151    Coag's Recent Labs  Lab 02/05/17 1101  APTT 25  INR 0.99    Sepsis Markers No results for input(s):  LATICACIDVEN, PROCALCITON, O2SATVEN in the last 168 hours.  ABG Recent Labs  Lab 02/06/17 0958 02/08/17 0413  PHART 7.330* 7.323*  PCO2ART 54.9* 58.7*  PO2ART 119* 72.0*    Liver Enzymes Recent Labs  Lab 02/07/17 0601  AST 22  ALT 20  ALKPHOS 67  BILITOT 0.6  ALBUMIN 3.8    Cardiac Enzymes Recent Labs  Lab 02/06/17 0910  TROPONINI <0.03    Glucose Recent Labs  Lab 02/07/17 2334 02/08/17 0314 02/08/17 0800 02/08/17 2134 02/09/17 0053 02/09/17 0331  GLUCAP 137* 154* 113* 77 139* 121*    Imaging No results found.   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  CT evaluation demonstrated a large (6x5 x 5.5 x 5.8 cm) paratracheal mediastinal mass causing narrowing of the SVC and SVC syndrome that results in  collaterals.  In addition, she also has small pulmonary nodules (<38mm).  Brushings consistent with non-small cell.  Currently the plan is for radiation therapy to shrink mass.  She may need further invasive biopsies in the near future.  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 Acute Hypoxic Respiratory Failure secondary to above  Hemoptysis  NON-Small cell Carcinoma  P: Reduce decadron Improved upright position Maintain BIPAP nocturnal for now as prn Repeat pcxr in am  Radiation today  CARDIOVASCULAR: A: Hypertension remains P: ICU monitoring  Hydralazine 10 mg PO Q8, increase Bisoprolol 5mg  QD NO home ACEI with upper airway concerns Prn labetolol lasix  RENAL: A: At Risk AKI, neg 3 liters lasix P: Did well with  lasix Reduce dose Chem in am   GI: A: At Risk Protein Calorie Malnutrition  P: Diet advance today  ENDOCRINE: A: Hyperglycemia wnl P: Continue steroids for airway, reduce SSI  Monitor glucose on BMP   HEMEATOLOGY A: Hemoptysis secondary to Mediastinal Mass - resolved on vent 11/29 HIgh risk DVT P: Trend CBC  lovenox re added 11/29, if worsen hemoptysis then dc SCD's   ID: At risk post obstructive PNA P: Monitor temp curve pcxr in am   NEUROLOGICAL: A: Anxiety / Depression  P: Continue Zoloft  Fentanyl gtt for pain / sedation Add low dose oral benzo  Remain in icu with events overnight   Lavon Paganini. Titus Mould, MD, [place] Pgr: [facility] 02/09/2017 8:19 AM

## 2017-02-09 NOTE — Care Management Note (Signed)
Case Management Note  Patient Details  Name: Peachie Barkalow MRN: [id number] Date of Birth: September 27, 1940  Subjective/Objective:                  Extubated now on non rebreather o2 mask at 40-100%  Action/Plan: Date: February 09, 2017 Velva Harman, BSN, [place], [place] Chart and notes review for patient progress and needs. Will follow for case management and discharge needs. Next review date: [id number]  Expected Discharge Date:                  Expected Discharge Plan:  Home/Self Care  In-House Referral:     Discharge planning Services  CM Consult  Post Acute Care Choice:    Choice offered to:     DME Arranged:    DME Agency:     HH Arranged:    HH Agency:     Status of Service:  In process, will continue to follow  If discussed at Long Length of Stay Meetings, dates discussed:    Additional Comments:  Leeroy Cha, RN 02/09/2017, 8:47 AM

## 2017-02-09 NOTE — Evaluation (Signed)
Clinical/Bedside Swallow Evaluation Patient Details  Name: Cassidy Sanders MRN: [id number] Date of Birth: Aug 09, 1940  Today's Date: 02/09/2017 Time: SLP Start Time (ACUTE ONLY): 8921 SLP Stop Time (ACUTE ONLY): 1333 SLP Time Calculation (min) (ACUTE ONLY): 12 min  Past Medical History:  Past Medical History:  Diagnosis Date  . Brain tumor (benign) ([place])   . Depression   . Hypertension    Past Surgical History:  Past Surgical History:  Procedure Laterality Date  . ABDOMINAL HYSTERECTOMY    . BLADDER SURGERY    . BRAIN SURGERY    . ENDOBRONCHIAL ULTRASOUND Bilateral 02/06/2017   Procedure: ENDOBRONCHIAL ULTRASOUND;  Surgeon: Juanito Doom, MD;  Location: [facility];  Service: Cardiopulmonary;  Laterality: Bilateral;   HPI:  27 yowf quit smoking 4 y ago without apparent sequelae admitted 11/21 with progressive doe with audible wheeze x 1 month.  Found to have a large right paratracheal mass with compression, SVC syndrome.   Assessment / Plan / Recommendation Clinical Impression  Pts swallow presents within functional limits, however pts decreased respiratory status increases her risk for aspiration. Pt strongly denied any acute swallow difficulty. Pt on venti mask 10 L/min FIO2 of 45%. Pt with removal of mask for PO trials and was impuslive with intake resulting in mild O2 desaturation into the mid 80s. Educated regarding need for pacing and use of venti mask in between bites and sips which improved O2 saturation. Recommend continue regular thin liquid diet with safe swallow strategies. No further ST needs identified.   SLP Visit Diagnosis: Dysphagia, unspecified (R13.10)    Aspiration Risk  Mild aspiration risk    Diet Recommendation   Regular, thin liquids   Medication Administration: Whole meds with liquid    Other  Recommendations Oral Care Recommendations: Oral care BID   Follow up Recommendations        Frequency and Duration            Prognosis         Swallow Study   General Date of Onset: 01/31/17 HPI: 62 yowf quit smoking 4 y ago without apparent sequelae admitted 11/21 with progressive doe with audible wheeze x 1 month.  Found to have a large right paratracheal mass with compression, SVC syndrome. Type of Study: Bedside Swallow Evaluation Diet Prior to this Study: Regular;Thin liquids Temperature Spikes Noted: No Respiratory Status: Venti-mask History of Recent Intubation: Yes Length of Intubations (days): 2 days Date extubated: 02/08/17 Behavior/Cognition: Alert;Cooperative;Pleasant mood Oral Cavity Assessment: Dry Oral Care Completed by SLP: Other (Comment)(pt declined ) Oral Cavity - Dentition: Adequate natural dentition Vision: Functional for self-feeding Self-Feeding Abilities: Able to feed self Patient Positioning: Upright in chair Baseline Vocal Quality: Hoarse Volitional Cough: Strong Volitional Swallow: Able to elicit    Oral/Motor/Sensory Function Overall Oral Motor/Sensory Function: Within functional limits   Ice Chips Ice chips: Within functional limits Presentation: Spoon   Thin Liquid Thin Liquid: Within functional limits Presentation: Cup;Straw    Nectar Thick Nectar Thick Liquid: Not tested   Honey Thick Honey Thick Liquid: Not tested   Puree Puree: Within functional limits   Solid   GO   Solid: Within functional limits Presentation: [place] MA, [place] 02/09/2017,1:47 PM

## 2017-02-09 NOTE — Progress Notes (Signed)
PT Cancellation Note  Patient Details Name: Cassidy Sanders MRN: [id number] DOB: January 29, 1941   Cancelled Treatment:    Reason Eval/Treat Not Completed: Other (comment) Pt politely requests to check back after her radiation.   Jos Cygan,KATHrine E 02/09/2017, 11:44 AM Carmelia Bake, PT, DPT 02/09/2017 Pager: [phone]

## 2017-02-09 NOTE — Evaluation (Signed)
SLP Cancellation Note  Patient Details Name: Cassidy Sanders MRN: [id number] DOB: June 26, 1940   Cancelled treatment:       Reason Eval/Treat Not Completed: Other (comment);Patient at procedure or test/unavailable(pt going to radiation at this time)   [name] 02/09/2017, 11:41 AM

## 2017-02-10 ENCOUNTER — Inpatient Hospital Stay (HOSPITAL_COMMUNITY): Payer: Medicare Other

## 2017-02-10 DIAGNOSIS — Z9981 Dependence on supplemental oxygen: Secondary | ICD-10-CM

## 2017-02-10 HISTORY — DX: Dependence on supplemental oxygen: Z99.81

## 2017-02-10 LAB — MAGNESIUM: MAGNESIUM: 2.3 mg/dL (ref 1.7–2.4)

## 2017-02-10 LAB — GLUCOSE, CAPILLARY
GLUCOSE-CAPILLARY: 96 mg/dL (ref 65–99)
GLUCOSE-CAPILLARY: 149 mg/dL — AB (ref 65–99)
GLUCOSE-CAPILLARY: 148 mg/dL — AB (ref 65–99)
Glucose-Capillary: 132 mg/dL — ABNORMAL HIGH (ref 65–99)
Glucose-Capillary: 139 mg/dL — ABNORMAL HIGH (ref 65–99)
Glucose-Capillary: 141 mg/dL — ABNORMAL HIGH (ref 65–99)
Glucose-Capillary: 103 mg/dL — ABNORMAL HIGH (ref 65–99)

## 2017-02-10 LAB — CBC WITH DIFFERENTIAL/PLATELET
BASOS PCT: 0 %
Basophils Absolute: 0 10*3/uL (ref 0.0–0.1)
EOS ABS: 0 10*3/uL (ref 0.0–0.7)
Eosinophils Relative: 0 %
HCT: 39.9 % (ref 36.0–46.0)
HEMOGLOBIN: 12.3 g/dL (ref 12.0–15.0)
Lymphocytes Relative: 8 %
Lymphs Abs: 0.8 10*3/uL (ref 0.7–4.0)
MCH: 28.7 pg (ref 26.0–34.0)
MCHC: 30.8 g/dL (ref 30.0–36.0)
MCV: 93.2 fL (ref 78.0–100.0)
MONOS PCT: 6 %
Monocytes Absolute: 0.6 10*3/uL (ref 0.1–1.0)
NEUTROS PCT: 86 %
Neutro Abs: 8.1 10*3/uL — ABNORMAL HIGH (ref 1.7–7.7)
PLATELETS: 154 10*3/uL (ref 150–400)
RBC: 4.28 MIL/uL (ref 3.87–5.11)
RDW: 14.5 % (ref 11.5–15.5)
WBC: 9.5 10*3/uL (ref 4.0–10.5)

## 2017-02-10 LAB — BASIC METABOLIC PANEL
Anion gap: 9 (ref 5–15)
BUN: 32 mg/dL — ABNORMAL HIGH (ref 6–20)
CHLORIDE: 97 mmol/L — AB (ref 101–111)
CO2: 35 mmol/L — ABNORMAL HIGH (ref 22–32)
CREATININE: 0.73 mg/dL (ref 0.44–1.00)
Calcium: 9 mg/dL (ref 8.9–10.3)
GFR calc non Af Amer: >60 mL/min (ref >60)
Glucose, Bld: 129 mg/dL — ABNORMAL HIGH (ref 65–99)
Potassium: 3.6 mmol/L (ref 3.5–5.1)
Sodium: 141 mmol/L (ref 135–145)

## 2017-02-10 LAB — PHOSPHORUS: PHOSPHORUS: 3.9 mg/dL (ref 2.5–4.6)

## 2017-02-10 MED ORDER — SODIUM CHLORIDE 0.9 % IV SOLN: INTRAVENOUS | Status: DC | PRN

## 2017-02-10 MED ORDER — FUROSEMIDE 40 MG PO TABS
40.0000 mg | ORAL_TABLET | Freq: Every day | ORAL | Status: DC
  Administered 2017-02-10 – 2017-02-12 (×3): 40 mg via ORAL
  Filled 2017-02-10 (×3): qty 1

## 2017-02-10 MED ORDER — POTASSIUM CHLORIDE CRYS ER 20 MEQ PO TBCR
40.0000 meq | EXTENDED_RELEASE_TABLET | Freq: Once | ORAL | Status: AC
  Administered 2017-02-10: 40 meq via ORAL
  Filled 2017-02-10: qty 2

## 2017-02-10 NOTE — Progress Notes (Signed)
PULMONARY / CRITICAL CARE MEDICINE   Name: Cassidy Sanders MRN: [id number] DOB: November 28, 1940    ADMISSION DATE:  01/31/2017 CONSULTATION DATE:  02/01/17  REFERRING MD:  Sheliah Plane / Frederich Chick  CHIEF COMPLAINT:  Sob/wheeze  HISTORY OF PRESENT ILLNESS:   76 yo female former smoker presented with dyspnea and stridor.  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  PMHx of HTN, depression.  SUBJECTIVE:  Needed non rebreather overnight.  Didn't need Bipap.  Anxiety medication helped.  Not has much wheezing.  Feels neb tx's help.  VITAL SIGNS: BP (!) 128/55   Pulse 80   Temp (!) 96.6 F (35.9 C) (Axillary)   Resp 18   Ht 5\' 9"  (1.753 m)   Wt 188 lb 7.9 oz (85.5 kg)   SpO2 97%   BMI 27.84 kg/m   INTAKE / OUTPUT: I/O last 3 completed shifts: In: 330 [I.V.:330] Out: 3025 [Urine:3025]  PHYSICAL EXAMINATION:  General - pleasant Eyes - pupils reactive ENT - no sinus tenderness, no oral exudate, no LAN, raspy voice Cardiac - regular, no murmur Chest - mild stridor, scattered rhonchi Abd - soft, non tender Ext - no edema Skin - no rashes Neuro - normal strength    LABS:  BMET Recent Labs  Lab 02/08/17 0322 02/09/17 0329 02/10/17 0321  NA 142 145 141  K 4.0 3.5 3.6  CL 108 102 97*  CO2 29 37* 35*  BUN 35* 32* 32*  CREATININE 0.67 0.70 0.73  GLUCOSE 164* 115* 129*    Electrolytes Recent Labs  Lab 02/07/17 1645 02/08/17 0322 02/09/17 0329 02/10/17 0321  CALCIUM  --  8.6* 9.3 9.0  MG 2.3 2.5*  --  2.3  PHOS 3.4 2.8  --  3.9    CBC Recent Labs  Lab 02/07/17 0601 02/09/17 0329 02/10/17 0321  WBC 14.0* 12.5* 9.5  HGB 13.5 13.2 12.3  HCT 42.7 42.6 39.9  PLT 174 151 154    Coag's Recent Labs  Lab 02/05/17 1101  APTT 25  INR 0.99    Sepsis Markers No results for input(s): LATICACIDVEN, PROCALCITON, O2SATVEN in the last 168 hours.  ABG Recent Labs  Lab 02/06/17 0958 02/08/17 0413  PHART 7.330* 7.323*  PCO2ART 54.9* 58.7*  PO2ART  119* 72.0*    Liver Enzymes Recent Labs  Lab 02/07/17 0601  AST 22  ALT 20  ALKPHOS 67  BILITOT 0.6  ALBUMIN 3.8    Cardiac Enzymes Recent Labs  Lab 02/06/17 0910  TROPONINI <0.03    Glucose Recent Labs  Lab 02/09/17 0751 02/09/17 1233 02/09/17 1846 02/10/17 0044 02/10/17 0315 02/10/17 0734  GLUCAP 107* 79 87 139* 132* 96    Imaging Dg Chest Port 1 View  Result Date: 02/10/2017 CLINICAL DATA:  Acute respiratory failure.  Ex-smoker. EXAM: PORTABLE CHEST 1 VIEW COMPARISON:  02/09/2017. FINDINGS: Cardiomegaly persists. There is slight improvement in vascular congestion. No consolidation or pulmonary edema. Degenerative change thoracic spine. IMPRESSION: Slight improvement in vascular congestion.  Cardiomegaly persists. Electronically Signed   By: Staci Righter M.D.   On: 02/10/2017 06:44     LINES/TUBES: ETT 11/27 >> 11/29  STUDIES: CT angio chest 11/21 >> atherosclerosis, soft tissue mass along paratracheal/precarinal/subcarinal portion of mediastinum narrowing Rt main bronchus and Rt PA, compression of SVC and azygos EBUS 11/27 >> non-small cell carcinoma, bronchomalacia  EVENTS 11/21  Admit with SOB, found to have large R paratracheal mass  11/22  Oncology consulted 11/27  EBUS >> hypotension during procedure so  only did brushing, radiation oncology consulted 11/29  Extubated  DISCUSSION: 76 yo female former smoker with NSCLC causing SVC syndrome and stridor.  ASSESSMENT / PLAN:  NSCLC with SVC syndrome. - continue decadron - f/u radiation oncology - oncology also following  Stridor. Bronchomalacia. - oxygen to keep SpO2 > 92% - prn Bipap - scheduled BDs  Hypertension. - continue lopressor, lasix, hydralazine  Hx of depression, insomnia. - continue zoloft - prn ativan - prn trazodone qhs sleep  Steroid induced hyperglycemia. - SSI  DVT prophylaxis - lovenox SUP - not indicated >> d/c protonix Nutrition - regular diet  Change to SDU  status.  Chesley Mires, MD [facility]monary/Critical Care 02/10/2017, 9:31 AM Pager:  [phone] After 3pm call: [phone]

## 2017-02-10 NOTE — Progress Notes (Signed)
Pt currently on 2 LPM [place] and tolerating well at this time.  Pt in no noted distress, BIPAP not needed at this time.  RT to monitor and assess as needed.

## 2017-02-11 LAB — CBC
HEMATOCRIT: 38.8 % (ref 36.0–46.0)
HEMOGLOBIN: 12.2 g/dL (ref 12.0–15.0)
MCH: 28.9 pg (ref 26.0–34.0)
MCHC: 31.4 g/dL (ref 30.0–36.0)
MCV: 91.9 fL (ref 78.0–100.0)
Platelets: 155 10*3/uL (ref 150–400)
RBC: 4.22 MIL/uL (ref 3.87–5.11)
RDW: 14.5 % (ref 11.5–15.5)
WBC: 10 10*3/uL (ref 4.0–10.5)

## 2017-02-11 LAB — GLUCOSE, CAPILLARY
GLUCOSE-CAPILLARY: 85 mg/dL (ref 65–99)
GLUCOSE-CAPILLARY: 165 mg/dL — AB (ref 65–99)
GLUCOSE-CAPILLARY: 101 mg/dL — AB (ref 65–99)
GLUCOSE-CAPILLARY: 95 mg/dL (ref 65–99)
Glucose-Capillary: 89 mg/dL (ref 65–99)

## 2017-02-11 LAB — BASIC METABOLIC PANEL
ANION GAP: 8 (ref 5–15)
BUN: 30 mg/dL — AB (ref 6–20)
CHLORIDE: 97 mmol/L — AB (ref 101–111)
CO2: 35 mmol/L — ABNORMAL HIGH (ref 22–32)
Calcium: 8.8 mg/dL — ABNORMAL LOW (ref 8.9–10.3)
Creatinine, Ser: 0.77 mg/dL (ref 0.44–1.00)
GFR calc Af Amer: >60 mL/min (ref >60)
Glucose, Bld: 97 mg/dL (ref 65–99)
POTASSIUM: 3.8 mmol/L (ref 3.5–5.1)
SODIUM: 140 mmol/L (ref 135–145)

## 2017-02-11 MED ORDER — INSULIN ASPART 100 UNIT/ML ~~LOC~~ SOLN: 0.0000 [IU] | Freq: Three times a day (TID) | SUBCUTANEOUS | Status: DC

## 2017-02-11 MED ORDER — INSULIN ASPART 100 UNIT/ML ~~LOC~~ SOLN: 0.0000 [IU] | Freq: Every day | SUBCUTANEOUS | Status: DC

## 2017-02-11 MED ORDER — DEXAMETHASONE SODIUM PHOSPHATE 4 MG/ML IJ SOLN
2.0000 mg | Freq: Two times a day (BID) | INTRAMUSCULAR | Status: DC
Start: 2017-02-11 — End: 2017-02-12
  Administered 2017-02-11 – 2017-02-12 (×2): 2 mg via INTRAVENOUS
  Filled 2017-02-11 (×2): qty 1

## 2017-02-11 NOTE — Progress Notes (Signed)
PULMONARY / CRITICAL CARE MEDICINE   Name: Cassidy Sanders MRN: [id number] DOB: 17-Feb-1941    ADMISSION DATE:  01/31/2017 CONSULTATION DATE:  02/01/17  REFERRING MD:  Sheliah Plane / Frederich Chick  CHIEF COMPLAINT:  Sob/wheeze  HISTORY OF PRESENT ILLNESS:   76 yo female former smoker presented with dyspnea and stridor.  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  PMHx of HTN, depression.  SUBJECTIVE:  Breathing better.  Didn't need Bipap.  Maintaining SpO2 with nasal cannula.  VITAL SIGNS: BP 98/60   Pulse 81   Temp 98.2 F (36.8 C) (Oral)   Resp 13   Ht 5\' 9"  (1.753 m)   Wt 188 lb 11.4 oz (85.6 kg)   SpO2 92%   BMI 27.87 kg/m   INTAKE / OUTPUT: I/O last 3 completed shifts: In: 1290 [P.O.:1000; I.V.:290] Out: 2000 [Urine:2000]  PHYSICAL EXAMINATION:  General - pleasant Eyes - pupils reactive ENT - no sinus tenderness, no oral exudate, no LAN Cardiac - regular, no murmur Chest - no wheeze, rales Abd - soft, non tender Ext - no edema Skin - no rashes Neuro - normal strength Psych - normal mood   LABS:  BMET Recent Labs  Lab 02/09/17 0329 02/10/17 0321 02/11/17 0336  NA 145 141 140  K 3.5 3.6 3.8  CL 102 97* 97*  CO2 37* 35* 35*  BUN 32* 32* 30*  CREATININE 0.70 0.73 0.77  GLUCOSE 115* 129* 97    Electrolytes Recent Labs  Lab 02/07/17 1645 02/08/17 0322 02/09/17 0329 02/10/17 0321 02/11/17 0336  CALCIUM  --  8.6* 9.3 9.0 8.8*  MG 2.3 2.5*  --  2.3  --   PHOS 3.4 2.8  --  3.9  --     CBC Recent Labs  Lab 02/09/17 0329 02/10/17 0321 02/11/17 0336  WBC 12.5* 9.5 10.0  HGB 13.2 12.3 12.2  HCT 42.6 39.9 38.8  PLT 151 154 155    Coag's Recent Labs  Lab 02/05/17 1101  APTT 25  INR 0.99    Sepsis Markers No results for input(s): LATICACIDVEN, PROCALCITON, O2SATVEN in the last 168 hours.  ABG Recent Labs  Lab 02/06/17 0958 02/08/17 0413  PHART 7.330* 7.323*  PCO2ART 54.9* 58.7*  PO2ART 119* 72.0*    Liver  Enzymes Recent Labs  Lab 02/07/17 0601  AST 22  ALT 20  ALKPHOS 67  BILITOT 0.6  ALBUMIN 3.8    Cardiac Enzymes Recent Labs  Lab 02/06/17 0910  TROPONINI <0.03    Glucose Recent Labs  Lab 02/10/17 1604 02/10/17 1926 02/10/17 2317 02/11/17 0341 02/11/17 0746 02/11/17 1132  GLUCAP 141* 148* 103* 89 85 165*    Imaging No results found.   LINES/TUBES: ETT 11/27 >> 11/29  STUDIES: CT angio chest 11/21 >> atherosclerosis, soft tissue mass along paratracheal/precarinal/subcarinal portion of mediastinum narrowing Rt main bronchus and Rt PA, compression of SVC and azygos EBUS 11/27 >> non-small cell carcinoma, bronchomalacia  EVENTS 11/21  Admit with SOB, found to have large R paratracheal mass  11/22  Oncology consulted 11/27  EBUS >> hypotension during procedure so only did brushing, radiation oncology consulted 11/29  Extubated, start XRT 12/02  To medical floor bed  DISCUSSION: 76 yo female former smoker with NSCLC causing SVC syndrome and stridor.  ASSESSMENT / PLAN:  NSCLC with SVC syndrome. - continue to wean decadron >> change to 2 mg IV q12h - plan for XRT on 12/03 - will need oncology f/u as outpt  Stridor. Bronchomalacia. -  oxygen to keep SpO2 > 92% - assess for home oxygen - scheduled BDs  Hypertension. - continue lopressor, lasix, hydralazine  Hx of depression, insomnia. - continue zoloft - prn ativan - prn trazodone qhs sleep  Steroid induced hyperglycemia. - SSI  Deconditioning. - f/u with PT  DVT prophylaxis - lovenox SUP - not indicated Nutrition - regular diet  Transfer to medical floor bed.    Chesley Mires, MD [facility] Pulmonary/Critical Care 02/11/2017, 12:41 PM Pager:  [phone] After 3pm call: [phone]

## 2017-02-11 NOTE — Plan of Care (Signed)
  Safety: Ability to remain free from injury will improve 02/11/2017 2033 - Progressing by Mickie Kay, RN

## 2017-02-12 ENCOUNTER — Telehealth: Payer: Self-pay | Admitting: *Deleted

## 2017-02-12 ENCOUNTER — Ambulatory Visit
Admit: 2017-02-12 | Discharge: 2017-02-12 | Disposition: A | Discharge status: Home / Self Care | Payer: Medicare Other | Attending: Radiation Oncology | Admitting: Radiation Oncology

## 2017-02-12 ENCOUNTER — Encounter: Payer: Self-pay | Admitting: *Deleted

## 2017-02-12 DIAGNOSIS — R5381 Other malaise: Secondary | ICD-10-CM

## 2017-02-12 DIAGNOSIS — R918 Other nonspecific abnormal finding of lung field: Secondary | ICD-10-CM

## 2017-02-12 DIAGNOSIS — J9601 Acute respiratory failure with hypoxia: Secondary | ICD-10-CM

## 2017-02-12 LAB — BASIC METABOLIC PANEL
ANION GAP: 8 (ref 5–15)
BUN: 27 mg/dL — ABNORMAL HIGH (ref 6–20)
CALCIUM: 8.9 mg/dL (ref 8.9–10.3)
CHLORIDE: 99 mmol/L — AB (ref 101–111)
CO2: 34 mmol/L — AB (ref 22–32)
Creatinine, Ser: 0.78 mg/dL (ref 0.44–1.00)
GFR calc non Af Amer: >60 mL/min (ref >60)
GLUCOSE: 126 mg/dL — AB (ref 65–99)
Potassium: 3.8 mmol/L (ref 3.5–5.1)
Sodium: 141 mmol/L (ref 135–145)

## 2017-02-12 LAB — GLUCOSE, CAPILLARY
GLUCOSE-CAPILLARY: 77 mg/dL (ref 65–99)
Glucose-Capillary: 97 mg/dL (ref 65–99)

## 2017-02-12 MED ORDER — METOPROLOL TARTRATE 50 MG PO TABS: 25.0000 mg | ORAL_TABLET | Freq: Two times a day (BID) | ORAL | 11 refills | Status: DC

## 2017-02-12 MED ORDER — TRAZODONE HCL 50 MG PO TABS: 50.0000 mg | ORAL_TABLET | Freq: Every evening | ORAL | 0 refills | Status: DC | PRN

## 2017-02-12 MED ORDER — HYDRALAZINE HCL 25 MG PO TABS: 25.0000 mg | ORAL_TABLET | Freq: Three times a day (TID) | ORAL | 6 refills | Status: DC

## 2017-02-12 MED ORDER — DEXAMETHASONE 4 MG PO TABS: 2.0000 mg | ORAL_TABLET | Freq: Every day | ORAL | Status: DC

## 2017-02-12 MED ORDER — HYDROCHLOROTHIAZIDE 12.5 MG PO CAPS: 25.0000 mg | ORAL_CAPSULE | Freq: Every day | ORAL | 11 refills | Status: DC

## 2017-02-12 MED ORDER — DEXAMETHASONE 2 MG PO TABS: ORAL_TABLET | ORAL | 0 refills | Status: DC

## 2017-02-12 NOTE — Care Management Important Message (Signed)
Important Message  Patient Details  Name: Kristian Hazzard MRN: [id number] Date of Birth: Nov 09, 1940   Medicare Important Message Given:  Yes    Kerin Salen 02/12/2017, 12:07 [place] Message  Patient Details  Name: Anzleigh Slaven MRN: [id number] Date of Birth: [birth year]   Medicare Important Message Given:  Yes    Kerin Salen 02/12/2017, 12:07 PM

## 2017-02-12 NOTE — Progress Notes (Signed)
Oncology Nurse Navigator Documentation  Oncology Nurse Navigator Flowsheets 02/12/2017  Navigator Location [facility]  Navigator Encounter Type Other/I updated Dr. Julien Nordmann on Cassidy Sanders's biopsy.  He requested PDL 1 to be sent for now.  I requested   Patient Visit Type Inpatient  Treatment Phase Treatment  Barriers/Navigation Needs Coordination of Care  Interventions Coordination of Care  Coordination of Care Other  Acuity Level 2  Time Spent with Patient 30

## 2017-02-12 NOTE — Telephone Encounter (Signed)
Oncology Nurse Navigator Documentation  Oncology Nurse Navigator Flowsheets 02/12/2017  Navigator Location [facility]  Referral date to RadOnc/MedOnc 02/12/2017  Navigator Encounter Type Telephone/I received referral on Cassidy Sanders. I called and updated on appt with Dr. Julien Nordmann.   Telephone Outgoing Call  Patient Visit Type Inpatient  Treatment Phase Treatment  Barriers/Navigation Needs Coordination of Care  Interventions Coordination of Care  Coordination of Care Appts  Acuity Level 2  Acuity Level 2 Assistance expediting appointments  Time Spent with Patient 30

## 2017-02-12 NOTE — Progress Notes (Signed)
PULMONARY / CRITICAL CARE MEDICINE   Name: Ann Bohne MRN: [id number] DOB: 05/05/1940    ADMISSION DATE:  01/31/2017 CONSULTATION DATE:  02/01/17  REFERRING MD:  Sheliah Plane / Frederich Chick  CHIEF COMPLAINT:  Sob/wheeze  HISTORY OF PRESENT ILLNESS:   76 yo female former smoker presented with dyspnea and stridor.  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  PMHx of HTN, depression.  SUBJECTIVE:    VITAL SIGNS: BP (!) 125/55   Pulse 83   Temp 98.8 F (37.1 C) (Axillary)   Resp 16   Ht 5\' 9"  (1.753 m)   Wt 188 lb 11.4 oz (85.6 kg)   SpO2 (!) 89%   BMI 27.87 kg/m   INTAKE / OUTPUT: I/O last 3 completed shifts: In: 1080 [P.O.:1080] Out: 1050 [Urine:1050]  PHYSICAL EXAMINATION:     LABS:  BMET Recent Labs  Lab 02/10/17 0321 02/11/17 0336 02/12/17 0321  NA 141 140 141  K 3.6 3.8 3.8  CL 97* 97* 99*  CO2 35* 35* 34*  BUN 32* 30* 27*  CREATININE 0.73 0.77 0.78  GLUCOSE 129* 97 126*    Electrolytes Recent Labs  Lab 02/07/17 1645 02/08/17 0322  02/10/17 0321 02/11/17 0336 02/12/17 0321  CALCIUM  --  8.6*   < > 9.0 8.8* 8.9  MG 2.3 2.5*  --  2.3  --   --   PHOS 3.4 2.8  --  3.9  --   --    < > = values in this interval not displayed.    CBC Recent Labs  Lab 02/09/17 0329 02/10/17 0321 02/11/17 0336  WBC 12.5* 9.5 10.0  HGB 13.2 12.3 12.2  HCT 42.6 39.9 38.8  PLT 151 154 155    Coag's Recent Labs  Lab 02/05/17 1101  APTT 25  INR 0.99    Sepsis Markers No results for input(s): LATICACIDVEN, PROCALCITON, O2SATVEN in the last 168 hours.  ABG Recent Labs  Lab 02/06/17 0958 02/08/17 0413  PHART 7.330* 7.323*  PCO2ART 54.9* 58.7*  PO2ART 119* 72.0*    Liver Enzymes Recent Labs  Lab 02/07/17 0601  AST 22  ALT 20  ALKPHOS 67  BILITOT 0.6  ALBUMIN 3.8    Cardiac Enzymes Recent Labs  Lab 02/06/17 0910  TROPONINI <0.03    Glucose Recent Labs  Lab 02/11/17 0341 02/11/17 0746 02/11/17 1132 02/11/17 1620  02/11/17 2130 02/12/17 0741  GLUCAP 89 85 165* 101* 95 97    Imaging No results found.   LINES/TUBES: ETT 11/27 >> 11/29  STUDIES: CT angio chest 11/21 >> atherosclerosis, soft tissue mass along paratracheal/precarinal/subcarinal portion of mediastinum narrowing Rt main bronchus and Rt PA, compression of SVC and azygos EBUS 11/27 >> non-small cell carcinoma, bronchomalacia  EVENTS 11/21  Admit with SOB, found to have large R paratracheal mass  11/22  Oncology consulted 11/27  EBUS >> hypotension during procedure so only did brushing, radiation oncology consulted 11/29  Extubated, start XRT 12/02  To medical floor bed  DISCUSSION: 76 yo female former smoker with NSCLC causing SVC syndrome and stridor.  ASSESSMENT / PLAN:  NSCLC with SVC syndrome. Plan Change to oral decadron For XRT today  Heme/onc arranging f/u  Stridor. Bronchomalacia. Plan Cont BDs Wean oxygen  Check walking oximetry to determine home needs  Hypertension. Plan Lopressor, lasix and hydralazine    Hx of depression, insomnia. Plan Cont zoloft PRN trazodone for sleep  Steroid induced hyperglycemia. Plan ssi   Deconditioning. Plan PT consulted recommended home  health PT and rolling walker **  DVT prophylaxis - lovenox SUP - not indicated Nutrition - regular diet

## 2017-02-12 NOTE — Discharge Summary (Signed)
Physician Discharge Summary       Patient ID: Cassidy Sanders MRN: [id number] DOB/AGE: 1940/03/14 76 y.o.  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  Was admitted for diagnostic E bus, bronchial brushings and diagnostics.  Remained intubated initially postoperative.  Initially attempts at biopsy were complicated by hypotension and respiratory failure.  We were able to get brushings.  She was empirically started on radiation therapy to shrink the mass.  Initial brushings were consistent with non-small cell lung cancer.  She was successfully extubated on 11/29, since that time we have continued supplemental oxygen, she is received Lasix, been weaned off positive pressure ventilation,And also has been on systemic steroids.  She is been seen by radiation oncology as well as oncology, she has plans for ongoing care through the cancer center.  As of 12/30 she is now status post 2 doses of radiation therapy.  She has had remarkable improvement.  She has had walking oximetry here in the hospital, noting desaturation. Briefly down to 86% when ambulating on room air.  She will be discharged to home as of 12/3 on 2 L nasal cannula she will follow-up with radiation oncology.   Discharge Plan by active problems   NSCLC with SVC syndrome. Plan Change to oral decadron For XRT today  Heme/onc arranging  f/u  Stridor. Bronchomalacia. Plan Cont BDs Home on 2 L nasal cannula  Hypertension. Plan Lopressor, lasix and hydralazine  Stopped ACE inhibitor given upper airway obstruction  Hx of depression, insomnia. Plan Cont zoloft PRN trazodone for sleep  Steroid induced hyperglycemia. Plan ssi   Deconditioning. Plan PT consulted recommended home health PT and rolling walker    Significant Hospital tests/ studies  Consults LINES/TUBES: ETT 11/27 >> 11/29  STUDIES: CT angio chest 11/21 >> atherosclerosis, soft tissue mass along paratracheal/precarinal/subcarinal portion of mediastinum narrowing Rt main bronchus and Rt PA, compression of SVC and azygos EBUS 11/27 >> non-small cell carcinoma, bronchomalacia  EVENTS 11/21  Admit with SOB, found to have large R paratracheal mass  11/22  Oncology consulted 11/27  EBUS >> hypotension during procedure so only did brushing, radiation oncology consulted 11/29  Extubated, start XRT 12/02  To medical floor bed     Discharge Exam: BP 111/65 (BP Location: Right Arm)   Pulse 96   Temp 98.6 F (37 C) (Oral)   Resp 18   Ht 5\' 9"  (1.753 m)   Wt 188 lb 11.4 oz (85.6 kg)   SpO2 96%   BMI 27.87 kg/m    General: 76 year old white female currently resting at bedside no acute distress HEENT: Normocephalic atraumatic.  No jugular venous distention.  No stridor, mucous membranes moist. Pulmonary: No rales, some transposed upper airway noises Cardiac regular rate and rhythm without murmur rub or gallop Abdomen soft nontender moving bowels GU voids quantity sufficient Extremities: Warm dry no edema Neuro/psych awake oriented and appropriate.  Labs at discharge Lab Results  Component Value Date   CREATININE 0.78 02/12/2017  BUN 27 (H) 02/12/2017   NA 141 02/12/2017   K 3.8 02/12/2017   CL 99 (L) 02/12/2017   CO2 34 (H) 02/12/2017   Lab Results  Component Value Date   WBC 10.0 02/11/2017   HGB 12.2 02/11/2017   HCT  38.8 02/11/2017   MCV 91.9 02/11/2017   PLT 155 02/11/2017   Lab Results  Component Value Date   ALT 20 02/07/2017   AST 22 02/07/2017   ALKPHOS 67 02/07/2017   BILITOT 0.6 02/07/2017   Lab Results  Component Value Date   INR 0.99 02/05/2017   CBG (last 3)  Recent Labs    02/11/17 2130 02/12/17 0741 02/12/17 1210  GLUCAP 95 97 77    Current radiology studies No results found.  Disposition:  01-Home or Self Care  Discharge Instructions    Diet - low sodium heart healthy   Complete by:  As directed    Increase activity slowly   Complete by:  As directed      Allergies as of 02/12/2017      Reactions   Latex Dermatitis   Gabapentin    Excessive sleep   Tape Rash      Medication List    STOP taking these medications   diphenhydrAMINE 25 MG tablet Commonly known as:  BENADRYL   lisinopril-hydrochlorothiazide 20-25 MG tablet Commonly known as:  PRINZIDE,ZESTORETIC     TAKE these medications   ADULT GUMMY Chew Chew 1 tablet by mouth daily.   Biotin 2.5 MG Tabs Take 2,500 mcg by mouth daily.   dexamethasone 2 MG tablet Commonly known as:  DECADRON Take 1 tab bid x 5d, then 1 tab daily x5d, then 1/2 tab x 6d and stop   hydrALAZINE 25 MG tablet Commonly known as:  APRESOLINE Take 1 tablet (25 mg total) by mouth every 8 (eight) hours.   hydrochlorothiazide 12.5 MG capsule Commonly known as:  MICROZIDE Take 2 capsules (25 mg total) by mouth daily.   metoprolol tartrate 50 MG tablet Commonly known as:  LOPRESSOR Take 0.5 tablets (25 mg total) by mouth 2 (two) times daily. What changed:    how much to take  when to take this   sertraline 50 MG tablet Commonly known as:  ZOLOFT Take 50 mg by mouth daily.   traZODone 50 MG tablet Commonly known as:  DESYREL Take 1 tablet (50 mg total) by mouth at bedtime as needed for sleep.   vitamin E 400 UNIT capsule Take 400 Units by mouth daily.        Discharged Condition: good  Physician  Statement:   The Patient was personally examined, the discharge assessment and plan has been personally reviewed and I agree with ACNP Arie Powell's assessment and plan. 45 minutes of time have been dedicated to discharge assessment, planning and discharge instructions.   Signed: Clementeen Graham 02/12/2017, 2:44 PM  Erick Colace ACNP-BC [place] Pager # [phone] OR # [phone] if no answer

## 2017-02-12 NOTE — Telephone Encounter (Signed)
Spoke with RN, patient is being trasnfered to 1341 this am, doing better and can come for radiation from there. Thanked Therapist, sports, notified [place]#3 9:10 AM  .

## 2017-02-12 NOTE — Progress Notes (Signed)
Events noted. Results from the brushing biopsy indicate non-small cell lung cancer. I will discuss this with the patient later today and will arrange follow up as an put patient likely with Dr. Julien Nordmann.

## 2017-02-12 NOTE — Progress Notes (Signed)
SATURATION QUALIFICATIONS: (This note is used to comply with regulatory documentation for home oxygen)  Patient Saturations on Room Air at Rest = 92%  Patient Saturations on Room Air while Ambulating = 86%  Patient Saturations on 2 Liters of oxygen while Ambulating = 91%  Please briefly explain why patient needs home oxygen:to maintain appropriate SaO2 levels.   Cassidy Sanders PT 02/12/2017  [phone]

## 2017-02-12 NOTE — Addendum Note (Signed)
Encounter addended by: Doreen Beam, RN on: 02/12/2017 9:39 AM  Actions taken: Charge Capture section accepted

## 2017-02-12 NOTE — Progress Notes (Signed)
Pt from [facility]. Plan is to return there with home 02. [facility] rep alerted of need for home 02 and RW. PT recommendations gone over with pt. Pt states that they have PT at [facility] that can come to her apartment. HHPT and face to face faxed to PT [facility] [phone]). Per pt, daughter is picking her up to transport her to [place]. Marney Doctor RN,BSN,NCM [phone]

## 2017-02-12 NOTE — Progress Notes (Signed)
Date: February 12, 2017 Tatia Petrucci, BSN, RN3, CCM [phone] Chart and notes review for patient progress and needs. Will follow for case management and discharge needs. Next review date: [id number] 

## 2017-02-12 NOTE — Progress Notes (Signed)
Physical Therapy Treatment Patient Details Name: Cassidy Sanders MRN: [id number] DOB: 10-17-1940 Today's Date: 02/12/2017    History of Present Illness 76 year old female admitted 01/31/17 with progressive DOE with audible wheeze x 1 month and found to have a large right paratracheal mass with compression, SVC syndrome. ETT 11/27-11/29/18    PT Comments    Good progress with mobility, pt ambulated 180' with RW, SaO2 86-88% on room air, 91% on 2L O2, 3/4 dyspnea. Will need RW and O2 for home.   Follow Up Recommendations  Home health PT;Supervision for mobility/OOB     Equipment Recommendations  Rolling walker with 5" wheels    Recommendations for Other Services       Precautions / Restrictions Precautions Precautions: Fall Precaution Comments: monitor O2 sats Restrictions Weight Bearing Restrictions: No    Mobility  Bed Mobility               General bed mobility comments: pt up in recliner on arrival  Transfers Overall transfer level: Modified independent Equipment used: Rolling walker (2 wheeled) Transfers: Sit to/from Stand Sit to Stand: Modified independent (Device/Increase time)         General transfer comment: good safety awareness  Ambulation/Gait Ambulation/Gait assistance: Supervision Ambulation Distance (Feet): 185 Feet Assistive device: Rolling walker (2 wheeled) Gait Pattern/deviations: Step-through pattern;Decreased stride length   Gait velocity interpretation: at or above normal speed for age/gender General Gait Details: steady with RW, SaO2 86-88% on room air walking, 91% on 2L O2 [place] walking   Stairs            Wheelchair Mobility    Modified Rankin (Stroke Patients Only)       Balance Overall balance assessment: Needs assistance           Standing balance-Leahy Scale: Fair Standing balance comment: requires UE support at this time                            Cognition Arousal/Alertness:  Awake/alert Behavior During Therapy: WFL for tasks assessed/performed Overall Cognitive Status: Within Functional Limits for tasks assessed                                        Exercises      General Comments        Pertinent Vitals/Pain Pain Assessment: No/denies pain    Home Living                      Prior Function            PT Goals (current goals can now be found in the care plan section) Acute Rehab PT Goals Patient Stated Goal: get back to playing bridge, care for spouse with Alzheimers PT Goal Formulation: With patient Time For Goal Achievement: 02/16/17 Potential to Achieve Goals: Good Progress towards PT goals: Progressing toward goals    Frequency    Min 3X/week      PT Plan      Co-evaluation              AM-PAC PT "6 Clicks" Daily Activity  Outcome Measure  Difficulty turning over in bed (including adjusting bedclothes, sheets and blankets)?: A Little Difficulty moving from lying on back to sitting on the side of the bed? : A Little Difficulty sitting down on and standing up from  a chair with arms (e.g., wheelchair, bedside commode, etc,.)?: None Help needed moving to and from a bed to chair (including a wheelchair)?: None Help needed walking in hospital room?: None Help needed climbing 3-5 steps with a railing? : A Little 6 Click Score: 21    End of Session Equipment Utilized During Treatment: Gait belt;Oxygen Activity Tolerance: Patient tolerated treatment well Patient left: in chair;with call bell/phone within reach Nurse Communication: Mobility status PT Visit Diagnosis: Other abnormalities of gait and mobility (R26.89)     Time: 7494-4967 PT Time Calculation (min) (ACUTE ONLY): 11 min  Charges:  $Gait Training: 8-22 mins                    G Codes:          Philomena Doheny 02/12/2017, 1:34 PM [phone]

## 2017-02-13 ENCOUNTER — Ambulatory Visit
Admission: RE | Admit: 2017-02-13 | Discharge: 2017-02-13 | Disposition: A | Discharge status: Home / Self Care | Payer: Medicare Other | Source: Ambulatory Visit | Attending: Radiation Oncology | Admitting: Radiation Oncology

## 2017-02-13 DIAGNOSIS — Z9889 Other specified postprocedural states: Secondary | ICD-10-CM | POA: Diagnosis not present

## 2017-02-13 DIAGNOSIS — Z510 Encounter for antineoplastic radiation therapy: Secondary | ICD-10-CM | POA: Diagnosis not present

## 2017-02-13 DIAGNOSIS — I10 Essential (primary) hypertension: Secondary | ICD-10-CM | POA: Diagnosis not present

## 2017-02-13 DIAGNOSIS — D496 Neoplasm of unspecified behavior of brain: Secondary | ICD-10-CM | POA: Diagnosis not present

## 2017-02-13 DIAGNOSIS — F329 Major depressive disorder, single episode, unspecified: Secondary | ICD-10-CM | POA: Diagnosis not present

## 2017-02-13 DIAGNOSIS — C3490 Malignant neoplasm of unspecified part of unspecified bronchus or lung: Secondary | ICD-10-CM | POA: Diagnosis not present

## 2017-02-13 DIAGNOSIS — C382 Malignant neoplasm of posterior mediastinum: Secondary | ICD-10-CM | POA: Diagnosis not present

## 2017-02-14 ENCOUNTER — Ambulatory Visit
Admission: RE | Admit: 2017-02-14 | Discharge: 2017-02-14 | Disposition: A | Discharge status: Home / Self Care | Payer: Medicare Other | Source: Ambulatory Visit | Attending: Radiation Oncology | Admitting: Radiation Oncology

## 2017-02-14 DIAGNOSIS — I10 Essential (primary) hypertension: Secondary | ICD-10-CM | POA: Diagnosis not present

## 2017-02-14 DIAGNOSIS — C382 Malignant neoplasm of posterior mediastinum: Secondary | ICD-10-CM | POA: Diagnosis not present

## 2017-02-14 DIAGNOSIS — Z9889 Other specified postprocedural states: Secondary | ICD-10-CM | POA: Diagnosis not present

## 2017-02-14 DIAGNOSIS — C3490 Malignant neoplasm of unspecified part of unspecified bronchus or lung: Secondary | ICD-10-CM | POA: Diagnosis not present

## 2017-02-14 DIAGNOSIS — D496 Neoplasm of unspecified behavior of brain: Secondary | ICD-10-CM | POA: Diagnosis not present

## 2017-02-14 DIAGNOSIS — F329 Major depressive disorder, single episode, unspecified: Secondary | ICD-10-CM | POA: Diagnosis not present

## 2017-02-14 DIAGNOSIS — Z510 Encounter for antineoplastic radiation therapy: Secondary | ICD-10-CM | POA: Diagnosis not present

## 2017-02-15 ENCOUNTER — Ambulatory Visit
Admission: RE | Admit: 2017-02-15 | Discharge: 2017-02-15 | Disposition: A | Discharge status: Home / Self Care | Payer: Medicare Other | Source: Ambulatory Visit | Attending: Radiation Oncology | Admitting: Radiation Oncology

## 2017-02-15 DIAGNOSIS — I10 Essential (primary) hypertension: Secondary | ICD-10-CM | POA: Diagnosis not present

## 2017-02-15 DIAGNOSIS — C382 Malignant neoplasm of posterior mediastinum: Secondary | ICD-10-CM | POA: Diagnosis not present

## 2017-02-15 DIAGNOSIS — F329 Major depressive disorder, single episode, unspecified: Secondary | ICD-10-CM | POA: Diagnosis not present

## 2017-02-15 DIAGNOSIS — Z510 Encounter for antineoplastic radiation therapy: Secondary | ICD-10-CM | POA: Diagnosis not present

## 2017-02-15 DIAGNOSIS — Z9889 Other specified postprocedural states: Secondary | ICD-10-CM | POA: Diagnosis not present

## 2017-02-15 DIAGNOSIS — C3490 Malignant neoplasm of unspecified part of unspecified bronchus or lung: Secondary | ICD-10-CM | POA: Diagnosis not present

## 2017-02-15 DIAGNOSIS — D496 Neoplasm of unspecified behavior of brain: Secondary | ICD-10-CM | POA: Diagnosis not present

## 2017-02-16 ENCOUNTER — Ambulatory Visit
Admission: RE | Admit: 2017-02-16 | Discharge: 2017-02-16 | Disposition: A | Discharge status: Home / Self Care | Payer: Medicare Other | Source: Ambulatory Visit | Attending: Radiation Oncology | Admitting: Radiation Oncology

## 2017-02-16 DIAGNOSIS — F329 Major depressive disorder, single episode, unspecified: Secondary | ICD-10-CM | POA: Diagnosis not present

## 2017-02-16 DIAGNOSIS — C382 Malignant neoplasm of posterior mediastinum: Secondary | ICD-10-CM | POA: Diagnosis not present

## 2017-02-16 DIAGNOSIS — Z9889 Other specified postprocedural states: Secondary | ICD-10-CM | POA: Diagnosis not present

## 2017-02-16 DIAGNOSIS — I10 Essential (primary) hypertension: Secondary | ICD-10-CM | POA: Diagnosis not present

## 2017-02-16 DIAGNOSIS — Z510 Encounter for antineoplastic radiation therapy: Secondary | ICD-10-CM | POA: Diagnosis not present

## 2017-02-16 DIAGNOSIS — D496 Neoplasm of unspecified behavior of brain: Secondary | ICD-10-CM | POA: Diagnosis not present

## 2017-02-16 DIAGNOSIS — C3490 Malignant neoplasm of unspecified part of unspecified bronchus or lung: Secondary | ICD-10-CM | POA: Diagnosis not present

## 2017-02-19 ENCOUNTER — Ambulatory Visit: Payer: Medicare Other

## 2017-02-20 ENCOUNTER — Encounter: Payer: Self-pay | Admitting: *Deleted

## 2017-02-20 ENCOUNTER — Telehealth: Payer: Self-pay | Admitting: Internal Medicine

## 2017-02-20 ENCOUNTER — Ambulatory Visit: Payer: Medicare Other | Admitting: Internal Medicine

## 2017-02-20 ENCOUNTER — Ambulatory Visit
Admission: RE | Admit: 2017-02-20 | Discharge: 2017-02-20 | Disposition: A | Discharge status: Home / Self Care | Payer: Medicare Other | Source: Ambulatory Visit | Attending: Radiation Oncology | Admitting: Radiation Oncology

## 2017-02-20 ENCOUNTER — Ambulatory Visit: Payer: Medicare Other

## 2017-02-20 ENCOUNTER — Ambulatory Visit (HOSPITAL_BASED_OUTPATIENT_CLINIC_OR_DEPARTMENT_OTHER): Payer: Medicare Other | Admitting: Internal Medicine

## 2017-02-20 ENCOUNTER — Encounter: Payer: Self-pay | Admitting: Internal Medicine

## 2017-02-20 ENCOUNTER — Other Ambulatory Visit (HOSPITAL_BASED_OUTPATIENT_CLINIC_OR_DEPARTMENT_OTHER): Payer: Medicare Other

## 2017-02-20 ENCOUNTER — Telehealth: Payer: Self-pay | Admitting: *Deleted

## 2017-02-20 VITALS — BP 150/54 | HR 116 | Temp 97.8°F | Resp 19 | Ht 69.0 in | Wt 193.5 lb

## 2017-02-20 DIAGNOSIS — M7989 Other specified soft tissue disorders: Secondary | ICD-10-CM | POA: Diagnosis not present

## 2017-02-20 DIAGNOSIS — I10 Essential (primary) hypertension: Secondary | ICD-10-CM | POA: Diagnosis not present

## 2017-02-20 DIAGNOSIS — Z8052 Family history of malignant neoplasm of bladder: Secondary | ICD-10-CM | POA: Diagnosis not present

## 2017-02-20 DIAGNOSIS — D329 Benign neoplasm of meninges, unspecified: Secondary | ICD-10-CM

## 2017-02-20 DIAGNOSIS — D496 Neoplasm of unspecified behavior of brain: Secondary | ICD-10-CM | POA: Diagnosis not present

## 2017-02-20 DIAGNOSIS — R0609 Other forms of dyspnea: Secondary | ICD-10-CM

## 2017-02-20 DIAGNOSIS — Z87891 Personal history of nicotine dependence: Secondary | ICD-10-CM | POA: Diagnosis not present

## 2017-02-20 DIAGNOSIS — R634 Abnormal weight loss: Secondary | ICD-10-CM | POA: Diagnosis not present

## 2017-02-20 DIAGNOSIS — Z9889 Other specified postprocedural states: Secondary | ICD-10-CM | POA: Diagnosis not present

## 2017-02-20 DIAGNOSIS — Z510 Encounter for antineoplastic radiation therapy: Secondary | ICD-10-CM | POA: Diagnosis not present

## 2017-02-20 DIAGNOSIS — R05 Cough: Secondary | ICD-10-CM | POA: Diagnosis not present

## 2017-02-20 DIAGNOSIS — C3490 Malignant neoplasm of unspecified part of unspecified bronchus or lung: Secondary | ICD-10-CM

## 2017-02-20 DIAGNOSIS — C382 Malignant neoplasm of posterior mediastinum: Secondary | ICD-10-CM | POA: Insufficient documentation

## 2017-02-20 DIAGNOSIS — Z9981 Dependence on supplemental oxygen: Secondary | ICD-10-CM

## 2017-02-20 DIAGNOSIS — F329 Major depressive disorder, single episode, unspecified: Secondary | ICD-10-CM | POA: Diagnosis not present

## 2017-02-20 DIAGNOSIS — R918 Other nonspecific abnormal finding of lung field: Secondary | ICD-10-CM

## 2017-02-20 LAB — COMPREHENSIVE METABOLIC PANEL
ALBUMIN: 3.4 g/dL — AB (ref 3.5–5.0)
ALK PHOS: 77 U/L (ref 40–150)
ALT: 24 U/L (ref 0–55)
AST: 15 U/L (ref 5–34)
Anion Gap: 11 mEq/L (ref 3–11)
BUN: 18.9 mg/dL (ref 7.0–26.0)
CALCIUM: 9.5 mg/dL (ref 8.4–10.4)
CO2: 31 mEq/L — ABNORMAL HIGH (ref 22–29)
CREATININE: 0.8 mg/dL (ref 0.6–1.1)
Chloride: 97 mEq/L — ABNORMAL LOW (ref 98–109)
EGFR: >60 mL/min/{1.73_m2} (ref >60)
Glucose: 112 mg/dl (ref 70–140)
Potassium: 3.3 mEq/L — ABNORMAL LOW (ref 3.5–5.1)
Sodium: 138 mEq/L (ref 136–145)
TOTAL PROTEIN: 6.5 g/dL (ref 6.4–8.3)
Total Bilirubin: 0.67 mg/dL (ref 0.20–1.20)

## 2017-02-20 LAB — CBC WITH DIFFERENTIAL/PLATELET
BASO%: 0.3 % (ref 0.0–2.0)
Basophils Absolute: 0 10*3/uL (ref 0.0–0.1)
EOS%: 0 % (ref 0.0–7.0)
Eosinophils Absolute: 0 10*3/uL (ref 0.0–0.5)
HCT: 36.5 % (ref 34.8–46.6)
HGB: 12 g/dL (ref 11.6–15.9)
LYMPH%: 5.6 % — AB (ref 14.0–49.7)
MCH: 28.8 pg (ref 25.1–34.0)
MCHC: 32.9 g/dL (ref 31.5–36.0)
MCV: 87.6 fL (ref 79.5–101.0)
MONO#: 0.4 10*3/uL (ref 0.1–0.9)
MONO%: 5 % (ref 0.0–14.0)
NEUT%: 89.1 % — AB (ref 38.4–76.8)
NEUTROS ABS: 7.5 10*3/uL — AB (ref 1.5–6.5)
Platelets: 184 10*3/uL (ref 145–400)
RBC: 4.17 10*6/uL (ref 3.70–5.45)
RDW: 15 % — ABNORMAL HIGH (ref 11.2–14.5)
WBC: 8.4 10*3/uL (ref 3.9–10.3)
lymph#: 0.5 10*3/uL — ABNORMAL LOW (ref 0.9–3.3)

## 2017-02-20 NOTE — Telephone Encounter (Signed)
Gave avs dana will schedule

## 2017-02-20 NOTE — Progress Notes (Signed)
[place] Telephone:([phone]) [phone]   [phone]  CONSULT NOTE  REFERRING PHYSICIAN: Dr. Simonne Maffucci  REASON FOR CONSULTATION:  76 years old white female recently diagnosed with lung cancer.  HPI Cassidy Sanders is a 76 y.o. female with past medical history significant for hypertension, depression, meningioma status post resection, total abdominal hysterectomy as well as bladder surgery and long history for smoking but quit 4 years ago.  The patient mentioned that since June 2018 she has been complaining of increasing cough and wheezing.  She saw her primary care physician and had chest x-ray performed on January 03, 2017 and it showed abnormal right suprahilar and right paratracheal soft tissue density.  She was treated with antibiotics with no improvement in her condition.  The patient has additional symptoms including hemoptysis and she presented to the emergency department and CT angiogram of the chest was performed on January 31, 2017 and it showed 6.5 x 5.5 x 5.8 cm right paratracheal mediastinal mass causing market luminal narrowing of the SVC as well as narrowing of the right main pulmonary artery and branches.  There was also questionable bilateral adrenal adenomas.  CT scan of the abdomen and pelvis on February 01, 2017 was negative for metastatic disease.  On February 06, 2017 the patient underwent bronchoscopy with endobronchial ultrasound and biopsy under the care of Dr. Lake Bells.  It showed infiltrative mass in the right mainstem bronchus.  Brushing of the right mainstem bronchus was performed.  During the procedure the patient become hypotensive and the procedure was stopped at that time.  The final cytology ([id number]) showed malignant cells consistent with non-small cell carcinoma. The patient was seen by radiation oncology, Dr. Lisbeth Renshaw and started on palliative radiotherapy to the large mediastinal mass and the last fraction is expected to be given  tomorrow.  She is feeling much better.  She denied having any current hemoptysis.  She continues to have shortness of breath at baseline increased with exertion and she is currently on home oxygen.  She denied having any chest pain but continues to have mild cough with no hemoptysis.  She also has a swelling of the lower extremities.  She lost around 20 pounds in the last few months.  She denied having any history of headache or visual changes.  She has no nausea, vomiting, diarrhea or constipation. Family history significant for mother with bladder cancer and father with a stroke. The patient is married and has 2 daughters.  She was accompanied today by her daughter [name].  She used to work as a Marine scientist.  She has a history for smoking 1 pack/day for around 45 years and quit 4 years ago.  She drinks alcohol on daily basis.  She denied having any history of drug abuse.   HPI  Past Medical History:  Diagnosis Date  . Brain tumor (benign) ([place])   . Depression   . Hypertension     Past Surgical History:  Procedure Laterality Date  . ABDOMINAL HYSTERECTOMY    . BLADDER SURGERY    . BRAIN SURGERY    . ENDOBRONCHIAL ULTRASOUND Bilateral 02/06/2017   Procedure: ENDOBRONCHIAL ULTRASOUND;  Surgeon: Juanito Doom, MD;  Location: [facility];  Service: Cardiopulmonary;  Laterality: Bilateral;    No family history on file.  Social History Social History   Tobacco Use  . Smoking status: Former Research scientist (life sciences)  . Smokeless tobacco: Never Used  Substance Use Topics  . Alcohol use: Yes    Comment: weekly  .  Drug use: No    Allergies  Allergen Reactions  . Latex Dermatitis  . Gabapentin     Excessive sleep   . Tape Rash    Current Outpatient Medications  Medication Sig Dispense Refill  . Biotin 2.5 MG TABS Take 2,500 mcg by mouth daily.    Marland Kitchen dexamethasone (DECADRON) 2 MG tablet Take 1 tab bid x 5d, then 1 tab daily x5d, then 1/2 tab x 6d and stop 18 tablet 0  . hydrALAZINE (APRESOLINE)  25 MG tablet Take 1 tablet (25 mg total) by mouth every 8 (eight) hours. 90 tablet 6  . hydrochlorothiazide (MICROZIDE) 12.5 MG capsule Take 2 capsules (25 mg total) by mouth daily. 60 capsule 11  . metoprolol tartrate (LOPRESSOR) 50 MG tablet Take 0.5 tablets (25 mg total) by mouth 2 (two) times daily. 30 tablet 11  . Multiple Vitamins-Minerals (ADULT GUMMY) CHEW Chew 1 tablet by mouth daily.    . sertraline (ZOLOFT) 50 MG tablet Take 50 mg by mouth daily.    . traZODone (DESYREL) 50 MG tablet Take 1 tablet (50 mg total) by mouth at bedtime as needed for sleep. 30 tablet 0  . vitamin E 400 UNIT capsule Take 400 Units by mouth daily.     No current facility-administered medications for this visit.     Review of Systems  Constitutional: positive for fatigue Eyes: negative Ears, nose, mouth, throat, and face: negative Respiratory: positive for cough and dyspnea on exertion Cardiovascular: negative Gastrointestinal: negative Genitourinary:negative Integument/breast: negative Hematologic/lymphatic: negative Musculoskeletal:negative Neurological: negative Behavioral/Psych: negative Endocrine: negative Allergic/Immunologic: negative  Physical Exam  ELF:YBOFB, healthy, no distress, well nourished and well developed SKIN: skin color, texture, turgor are normal, no rashes or significant lesions HEAD: Normocephalic, No masses, lesions, tenderness or abnormalities EYES: normal, PERRLA, Conjunctiva are pink and non-injected EARS: External ears normal, Canals clear OROPHARYNX:no exudate, no erythema and lips, buccal mucosa, and tongue normal  NECK: supple, no adenopathy, no JVD LYMPH:  no palpable lymphadenopathy, no hepatosplenomegaly BREAST:not examined LUNGS: clear to auscultation , and palpation HEART: regular rate & rhythm, no murmurs and no gallops ABDOMEN:abdomen soft, non-tender, normal bowel sounds and no masses or organomegaly BACK: Back symmetric, no curvature., No CVA  tenderness EXTREMITIES:no joint deformities, effusion, or inflammation, no edema, no skin discoloration  NEURO: alert & oriented x 3 with fluent speech, no focal motor/sensory deficits  PERFORMANCE STATUS: ECOG 1  LABORATORY DATA: Lab Results  Component Value Date   WBC 8.4 02/20/2017   HGB 12.0 02/20/2017   HCT 36.5 02/20/2017   MCV 87.6 02/20/2017   PLT 184 02/20/2017      Chemistry      Component Value Date/Time   NA 141 02/12/2017 0321   K 3.8 02/12/2017 0321   CL 99 (L) 02/12/2017 0321   CO2 34 (H) 02/12/2017 0321   BUN 27 (H) 02/12/2017 0321   CREATININE 0.78 02/12/2017 0321      Component Value Date/Time   CALCIUM 8.9 02/12/2017 0321   ALKPHOS 67 02/07/2017 0601   AST 22 02/07/2017 0601   ALT 20 02/07/2017 0601   BILITOT 0.6 02/07/2017 0601       RADIOGRAPHIC STUDIES: Dg Chest 1 View  Result Date: 02/05/2017 CLINICAL DATA:  Hemoptysis EXAM: CHEST 1 VIEW COMPARISON:  02/05/2017 FINDINGS: Airspace disease noted in the medial right upper lobe corresponding to the right paratracheal/suprahilar mass seen in this area on prior chest CT. Probable postobstructive process in the medial right upper lobe. No confluent opacity  on the left. Heart is normal size. No effusions or acute bony abnormality. IMPRESSION: Right suprahilar/paratracheal mass with medial right upper lobe airspace opacity, likely postobstructive atelectasis or pneumonia. Electronically Signed   By: Rolm Baptise M.D.   On: 02/05/2017 08:46   Ct Angio Chest Pe W And/or Wo Contrast  Result Date: 01/31/2017 CLINICAL DATA:  Dyspnea x1 month without chest pain. EXAM: CT ANGIOGRAPHY CHEST WITH CONTRAST TECHNIQUE: Multidetector CT imaging of the chest was performed using the standard protocol during bolus administration of intravenous contrast. Multiplanar CT image reconstructions and MIPs were obtained to evaluate the vascular anatomy. CONTRAST:  132mL ISOVUE-370 IOPAMIDOL (ISOVUE-370) INJECTION 76% COMPARISON:   01/03/2017 FINDINGS: Cardiovascular: Satisfactory opacification of the pulmonary arterial system. No acute pulmonary embolus is noted. There is mild aortic atherosclerosis without aneurysm. Left main and three-vessel coronary arteriosclerosis is noted. Heart is enlarged without pericardial effusion. Mediastinum/Nodes: Confluent soft tissue mass within the mediastinum along the paratracheal, pre carinal and subcarinal portion of the mediastinum narrowing the lumen of the right mainstem bronchus as well as causing mass effect and luminal narrowing of the right pulmonary artery and branches to the right upper lobe. Additionally, there is marked luminal extrinsic impression on the SVC and azygos veins causing venous collaterals along the anterior right chest wall and mediastinum via intercostals in particular the left superior intercostal vein via accessory hemiazygous. This mass is estimated at 6 x 5.5 x 5.8 cm. The thyroid, trachea and left mainstem bronchus are patent. Esophagus is not well characterized due to the adjacent mass. Lungs/Pleura: No effusion or pneumothorax. There is dependent atelectasis within both lower lobes and subsegmental atelectasis along the medial aspect of the right upper lobe. No postobstructive pneumonia. No dominant pulmonary mass. Mild hypoventilatory changes are believed to account for the bilateral ground-glass opacities noted. There are a few tiny subpleural nodular densities on the order of 3 mm or less bilaterally the upper lobes. Upper Abdomen: Bilateral adrenal nodules are noted on the left measuring 2.7 cm in maximum dimension and on the right 1.7 cm. Both demonstrate low Hounsfield units suggesting adenomas. Nonspecific hypervascular blush in the left hepatic lobe measuring 15 mm. Layering calculi are noted within the included gallbladder without wall thickening. Musculoskeletal: No aggressive nor suspicious osseous lesions. Review of the MIP images confirms the above findings.  IMPRESSION: 1. 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 Additional luminal narrowing of the right main pulmonary artery and branches to the right upper lobe and luminal narrowing of the right mainstem bronchus. 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 Other consideration would include metastatic lymphadenopathy from other primary source of malignancy. 2. No acute pulmonary embolus. 3. Left main and three-vessel coronary arteriosclerosis. 4. 3 mm or less subpleural nodular densities are seen in the upper lobes bilaterally. No follow-up needed if patient is low-risk (and has no known or suspected primary neoplasm). Non-contrast chest CT can be considered in 12 months if patient is high-risk. This recommendation follows the consensus statement: Guidelines for Management of Incidental Pulmonary Nodules Detected on CT Images: From the Fleischner Society 2017; Radiology 2017; 284:228-243. 5. Left 2.7 cm and right 1.7 cm adrenal nodules favoring adenomas given low Hounsfield units within both nodules. 6. Nonspecific hypervascular blush within the left hepatic lobe measuring  15 mm. Hypervascular hepatic lobe lesion versus vascular anomaly. 7. Uncomplicated cholelithiasis. Aortic Atherosclerosis (ICD10-I70.0). Electronically Signed   By: Ashley Royalty M.D.   On: 01/31/2017 14:36   Ct Abdomen Pelvis W Contrast  Result Date: 02/01/2017 CLINICAL DATA:  76 year old female with a newly diagnosed right hilar mass. CT scan of the abdomen and pelvis ordered for staging. EXAM: CT ABDOMEN AND PELVIS WITH CONTRAST TECHNIQUE: Multidetector CT imaging of the abdomen and pelvis was performed using the standard protocol following bolus administration of intravenous  contrast. CONTRAST:  11mL ISOVUE-300 IOPAMIDOL (ISOVUE-300) INJECTION 61% COMPARISON:  CT scan of the chest 01/31/2017 FINDINGS: Lower chest: Minimal dependent atelectasis. The right hilar mass is not included in the field of view. The heart is normal in size. Coronary artery calcifications are present. No pericardial effusion. Unremarkable visualized distal thoracic esophagus. Hepatobiliary: Normal hepatic morphology and contours. No evidence of hepatic metastatic disease. The portal veins are patent. High attenuation material layers within the gallbladder lumen consistent with cholelithiasis. No biliary ductal dilatation. Pancreas: Unremarkable. No pancreatic ductal dilatation or surrounding inflammatory changes. Spleen: Normal in size without focal abnormality. Adrenals/Urinary Tract: 1.9 cm low-attenuation left adrenal nodule is favored to represent a benign adenoma. A 1.5 cm right adrenal nodule is also low in attenuation and favored to be benign in nature. No evidence of enhancing renal mass, hydronephrosis or nephrolithiasis. Tiny subcentimeter low-attenuation renal lesions are too small to characterize and statistically highly likely benign cysts. The ureters and bladder are unremarkable. Stomach/Bowel: No evidence of obstruction or focal bowel wall thickening. Normal appendix in the right lower quadrant. The terminal ileum is unremarkable. Vascular/Lymphatic: Scattered atherosclerotic vascular calcifications including along the length of the abdominal aorta. The infrarenal abdominal aorta is ectatic measuring up to 2.6 cm in diameter. No suspicious lymphadenopathy. Reproductive: Surgical changes of prior hysterectomy. Unremarkable adnexal without evidence of mass. Other: No free fluid.  Small fat containing umbilical hernia. Musculoskeletal: No acute fracture or aggressive appearing lytic or blastic osseous lesion. Multilevel degenerative disc disease and facet arthropathy. Compression deformity of the  superior endplate of S06 without focal lucency is favored to be chronic in nature. IMPRESSION: 1. No definite evidence of metastatic disease within the abdomen or pelvis. 2. Bilateral low-attenuation adrenal nodules are favored to represent benign adenomas. However, attention is recommended on follow-up PET-CT imaging to assess for superimposed hypermetabolism which could indicate a collision tumor. 3. Ectatic abdominal aorta at risk for aneurysm development. Recommend followup by ultrasound in 5 years. This recommendation follows ACR consensus guidelines: White Paper of the ACR Incidental Findings Committee II on Vascular Findings. J Am Coll Radiol 2013; 30:160-109. 4.  Aortic Atherosclerosis (ICD10-170.0) 5. Multilevel degenerative disc disease and facet arthropathy. 6. T12 compression deformity with approximately 40% height loss anteriorly appears chronic but is age indeterminate without prior imaging for comparison. Electronically Signed   By: Jacqulynn Cadet M.D.   On: 02/01/2017 12:40   Dg Chest Port 1 View  Result Date: 02/10/2017 CLINICAL DATA:  Acute respiratory failure.  Ex-smoker. EXAM: PORTABLE CHEST 1 VIEW COMPARISON:  02/09/2017. FINDINGS: Cardiomegaly persists. There is slight improvement in vascular congestion. No consolidation or pulmonary edema. Degenerative change thoracic spine. IMPRESSION: Slight improvement in vascular congestion.  Cardiomegaly persists. Electronically Signed   By: Staci Righter M.D.   On: 02/10/2017 06:44   Dg Chest Port 1 View  Result Date: 02/09/2017 CLINICAL DATA:  76 year old female with a history respiratory failure EXAM: PORTABLE CHEST 1 VIEW COMPARISON:  02/08/2017, 02/07/2017 FINDINGS: Cardiomediastinal  silhouette unchanged in size and contour. Interval removal of endotracheal tube and gastric tube. Similar appearance of interlobular septal thickening in coarsened interstitial markings. Hazy opacity at the lung bases with partial obscuration of the right  hemidiaphragm and blunting of the costophrenic angles. IMPRESSION: Interval removal of endotracheal tube and gastric tube. Similar appearance of mixed interstitial and airspace disease with questionable small right pleural effusion. Electronically Signed   By: Corrie Mckusick D.O.   On: 02/09/2017 09:22   Dg Chest Port 1 View  Result Date: 02/08/2017 CLINICAL DATA:  Respiratory failure. EXAM: PORTABLE CHEST 1 VIEW COMPARISON:  02/07/2017 FINDINGS: The endotracheal tube remains in good position, 3.8 cm above the carina. The NG tube is coursing down the esophagus and into the stomach. Stable mild cardiac enlargement and moderate tortuosity and ectasia of the thoracic aorta. Stable right hilar mass. Mild stable interstitial changes. IMPRESSION: Stable support apparatus. Stable right hilar mass and chronic interstitial changes. Electronically Signed   By: Marijo Sanes M.D.   On: 02/08/2017 07:39   Dg Chest Port 1 View  Result Date: 02/07/2017 CLINICAL DATA:  76 y/o female status post fiberoptic bronchoscopy with endobronchial ultrasound and biopsies of right hilar lung mass yesterday. EXAM: PORTABLE CHEST 1 VIEW COMPARISON:  04/08/2016 and earlier. FINDINGS: Portable AP semi upright view at 0448 hours. Stable endotracheal tube tip at the level the clavicles. Enteric tube courses to the abdomen, tip not included. Stable lung volumes. Decreased veiling opacity at the right lung base which may have been related to bronchial alveolar lavage yesterday. Stable abnormal right mediastinal and hilar contour. No pneumothorax. Stable pulmonary vascularity without overt edema. No new pulmonary opacity. IMPRESSION: 1.  Stable lines and tubes. 2. Status post bronchoscopy and biopsies of right hilar lung mass yesterday with no adverse features or new cardiopulmonary abnormality. Electronically Signed   By: Genevie Ann M.D.   On: 02/07/2017 07:09   Dg Chest Port 1 View  Result Date: 02/06/2017 CLINICAL DATA:  76 year old  female with acute respiratory failure. Subsequent encounter. EXAM: PORTABLE CHEST 1 VIEW COMPARISON:  02/05/2017 chest x-ray. FINDINGS: Endotracheal tube tip 3.7 cm above the carina. Nasogastric tube courses below the diaphragm. Tip is not included on the present exam. Right hilar mass. Interval development of consolidation right lung base may represent obstructive atelectasis/infiltrate given the presence of right hilar mass. Mild pulmonary vascular prominence superimposed upon chronic lung changes. Cardiomegaly. Calcified aorta. IMPRESSION: Right hilar mass. Interval development of consolidation right lung base may represent obstructive atelectasis/infiltrate given the presence of right hilar mass causing bronchial narrowed. Cardiomegaly. Endotracheal tube tip 3.7 cm above the carina. Electronically Signed   By: Genia Del M.D.   On: 02/06/2017 12:14   Dg Chest Port 1 View  Result Date: 02/05/2017 CLINICAL DATA:  Shortness of breath EXAM: PORTABLE CHEST 1 VIEW COMPARISON:  01/03/2017, 01/31/2017 FINDINGS: Borderline cardiomegaly. No consolidation or effusion. Negative for pneumothorax. Aortic atherosclerosis. Decreased right perihilar and paramediastinal opacity IMPRESSION: 1. Negative for edema or infiltrate 2. Interval decrease in right paratracheal mass Electronically Signed   By: Donavan Foil M.D.   On: 02/05/2017 01:42   Dg Abd Portable 1v  Result Date: 02/06/2017 CLINICAL DATA:  Orogastric tube placement. EXAM: PORTABLE ABDOMEN - 1 VIEW COMPARISON:  Radiograph of same day. FINDINGS: The bowel gas pattern is normal. Distal tip of orogastric tube is seen expected position of distal stomach. No radio-opaque calculi or other significant radiographic abnormality are seen. IMPRESSION: Distal tip of orogastric tube seen  in expected position of distal stomach. No evidence of bowel obstruction or ileus. Electronically Signed   By: Marijo Conception, M.D.   On: 02/06/2017 12:19   Dg Abd Portable  1v  Result Date: 02/06/2017 CLINICAL DATA:  Abdominal pain EXAM: PORTABLE ABDOMEN - 1 VIEW COMPARISON:  02/01/2017 FINDINGS: Vascular calcifications. Nonobstructed bowel-gas pattern calcified pelvic phleboliths. IMPRESSION: Nonobstructed gas pattern Electronically Signed   By: Donavan Foil M.D.   On: 02/06/2017 00:45    ASSESSMENT: This is a very pleasant 76 years old white female recently diagnosed with at least stage IIIa (T3, N2, M0) non-small cell lung cancer diagnosed in November 2018 and presented with large right suprahilar and mediastinal mass.  PLAN: I had a lengthy discussion with the patient and her daughter today about her current disease of stage, prognosis and treatment options. I personally and independently reviewed the scan images and discussed the results and showed the images to the patient and her daughter. The patient underwent a course of palliative radiotherapy to the large mediastinal mass under the care of Dr. Lisbeth Renshaw and expected to complete this course tomorrow. I recommended for the patient to complete the staging workup by ordering a PET scan as well as CT scan of the head.  The patient cannot have MRI of the brain because of metals in her skull after her previous brain surgery for meningioma. Once the PET scan is done, we will consider the patient for repeat biopsy either with repeat bronchoscopy and endobronchial ultrasound versus biopsy of other lesion if identified on the PET scan.  This biopsy would be needed for further histologic subtype and molecular studies. I will see the patient back for follow-up visit in less than 2 weeks for reevaluation and more detailed discussion of her treatment options. She was advised to call immediately if she has any concerning symptoms in the interval. The patient voices understanding of current disease status and treatment options and is in agreement with the current care plan.  All questions were answered. The patient knows to  call the clinic with any problems, questions or concerns. We can certainly see the patient much sooner if necessary.  Thank you so much for allowing me to participate in the care of Cassidy Sanders. I will continue to follow up the patient with you and assist in her care.  I spent 40 minutes counseling the patient face to face. The total time spent in the appointment was 60 minutes.  Disclaimer: This note was dictated with voice recognition software. Similar sounding words can inadvertently be transcribed and may not be corrected upon review.   [name] February 20, 2017, 1:32 PM

## 2017-02-20 NOTE — Progress Notes (Signed)
  Radiation Oncology         [phone] ________________________________  Name: Cassidy Sanders MRN: [id number]  Date: 02/06/2017  DOB: 12-23-1940  SIMULATION AND TREATMENT PLANNING NOTE  DIAGNOSIS:     ICD-10-CM   1. 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  Identity was confirmed.  All relevant records and images related to the planned course of therapy were reviewed.   Written consent to proceed with treatment was confirmed which was freely given after reviewing the details related to the planned course of therapy had been reviewed with the patient.  Then, the patient was set-up in a stable reproducible  supine position for radiation therapy.  CT images were obtained.  Surface markings were placed.    Medically necessary complex treatment device(s) for immobilization: Customized accuform device.   The CT images were loaded into the planning software.  Then the target and avoidance structures were contoured.  Treatment planning then occurred.  The radiation prescription was entered and confirmed.  A total of 3 complex treatment devices were fabricated which relate to the designed radiation treatment fields. Each of these customized fields/ complex treatment devices will be used on a daily basis during the radiation course. I have requested : 3D Simulation  I have requested a DVH of the following structures: Target volume, spinal cord, heart, lungs.   PLAN:  The patient will receive 30 Gy in 10 fractions initially.  The patient's plan can be extended as necessary based on further workup..  ________________________________   Jodelle Gross, MD, PhD

## 2017-02-20 NOTE — Telephone Encounter (Signed)
After speaking with patient and daughter, called [facility] ,patient had to use our oxygen tank, here is` sempty, they are very upset with [facility], they need a full portable tank and a small portable tank, the one they have will not function or fill up, per Neece she wil have someone call the son, I also gave patient's home p[hone number to reach out to the patient 1:30 PM

## 2017-02-20 NOTE — Telephone Encounter (Signed)
Pt here to establish care with MD. Pt has concerns regarding her oxygen. Call to [facility] and [facility] to discuss Oxygen concerns. [facility] and Pt's daughter able to speak on phone during pt's visit to coordinate oxygen needs for pt.

## 2017-02-20 NOTE — Progress Notes (Signed)
Oncology Nurse Navigator Documentation  Oncology Nurse Navigator Flowsheets 02/20/2017  Navigator Location [facility]  Navigator Encounter Type Clinic/MDC/I spoke with patient and daughter today. Patient is feeling fine today but is having trouble with oxygen set up from home health. I called home health to update.  They have been trying to contact patient.  I got the patient's daughter and she was able to speak to home health on the phone to arrange a time for oxygen set up.   Patient Visit Type MedOnc  Barriers/Navigation Needs Coordination of Care  Interventions Coordination of Care  Coordination of Care Home Health  Acuity Level 2  Time Spent with Patient 30

## 2017-02-21 ENCOUNTER — Telehealth: Payer: Self-pay | Admitting: *Deleted

## 2017-02-21 ENCOUNTER — Ambulatory Visit
Admission: RE | Admit: 2017-02-21 | Discharge: 2017-02-21 | Disposition: A | Discharge status: Home / Self Care | Payer: Medicare Other | Source: Ambulatory Visit | Attending: Radiation Oncology | Admitting: Radiation Oncology

## 2017-02-21 DIAGNOSIS — I10 Essential (primary) hypertension: Secondary | ICD-10-CM | POA: Diagnosis not present

## 2017-02-21 DIAGNOSIS — C3490 Malignant neoplasm of unspecified part of unspecified bronchus or lung: Secondary | ICD-10-CM | POA: Diagnosis not present

## 2017-02-21 DIAGNOSIS — C382 Malignant neoplasm of posterior mediastinum: Secondary | ICD-10-CM | POA: Diagnosis not present

## 2017-02-21 DIAGNOSIS — Z510 Encounter for antineoplastic radiation therapy: Secondary | ICD-10-CM | POA: Diagnosis not present

## 2017-02-21 DIAGNOSIS — F329 Major depressive disorder, single episode, unspecified: Secondary | ICD-10-CM | POA: Diagnosis not present

## 2017-02-21 DIAGNOSIS — D496 Neoplasm of unspecified behavior of brain: Secondary | ICD-10-CM | POA: Diagnosis not present

## 2017-02-21 DIAGNOSIS — Z9889 Other specified postprocedural states: Secondary | ICD-10-CM | POA: Diagnosis not present

## 2017-02-21 NOTE — Telephone Encounter (Signed)
Oncology Nurse Navigator Documentation  Oncology Nurse Navigator Flowsheets 02/21/2017  Navigator Location [facility]  Navigator Encounter Type Telephone/I followed up on Mr. Pascale schedule. Her PET and CT head are scheduled for 12/31.  I called central scheduling to see if there was an earlier time but there is not one available. I called and spoke with daughter.  I updated her on me trying to move up appt but was unable.  I also updated her on appt for [place].  She verbalized understanding of appt time and place.   Telephone Outgoing Call  Treatment Phase Treatment  Barriers/Navigation Needs Coordination of Care  Interventions Coordination of Care  Coordination of Care Appts  Acuity Level 2  Time Spent with Patient 15

## 2017-02-22 ENCOUNTER — Encounter: Payer: Self-pay | Admitting: Radiation Oncology

## 2017-02-22 NOTE — Progress Notes (Signed)
  Radiation Oncology         [phone] ________________________________  Name: Cassidy Sanders MRN: [id number]  Date: 02/22/2017  DOB: [birth year]  End of Treatment Note  Diagnosis:   Malignant neoplasm of posterior mediastinum     Indication for treatment:  Palliative       Radiation treatment dates:   02/07/17-02/21/17  Site/dose:   Right chest/ 30 Gy in 10 fractions  Beams/energy:   3D/ 10X, 15X  Narrative: The patient tolerated radiation treatment relatively well.   During treatment the patient had no complaints of pain, coughing, or difficulty swallowing.  Plan: The patient has completed radiation treatment. The patient will return to radiation oncology clinic for routine followup in one month. I advised them to call or return sooner if they have any questions or concerns related to their recovery or treatment.  ------------------------------------------------  Jodelle Gross, MD, PhD  This document serves as a record of services personally performed by Kyung Rudd, MD. It was created on his behalf by Bethann Humble, a trained medical scribe. The creation of this record is based on the scribe's personal observations and the provider's statements to them. This document has been checked and approved by the attending provider.

## 2017-03-12 ENCOUNTER — Ambulatory Visit (HOSPITAL_COMMUNITY)
Admission: RE | Admit: 2017-03-12 | Discharge: 2017-03-12 | Disposition: A | Discharge status: Home / Self Care | Payer: Medicare Other | Source: Ambulatory Visit | Attending: Internal Medicine | Admitting: Internal Medicine

## 2017-03-12 ENCOUNTER — Encounter (HOSPITAL_COMMUNITY): Payer: Self-pay

## 2017-03-12 ENCOUNTER — Encounter (HOSPITAL_COMMUNITY)
Admission: RE | Admit: 2017-03-12 | Discharge: 2017-03-12 | Disposition: A | Discharge status: Home / Self Care | Payer: Medicare Other | Source: Ambulatory Visit | Attending: Internal Medicine | Admitting: Internal Medicine

## 2017-03-12 DIAGNOSIS — J90 Pleural effusion, not elsewhere classified: Secondary | ICD-10-CM | POA: Diagnosis not present

## 2017-03-12 DIAGNOSIS — D3502 Benign neoplasm of left adrenal gland: Secondary | ICD-10-CM | POA: Insufficient documentation

## 2017-03-12 DIAGNOSIS — C3490 Malignant neoplasm of unspecified part of unspecified bronchus or lung: Secondary | ICD-10-CM | POA: Insufficient documentation

## 2017-03-12 DIAGNOSIS — K8020 Calculus of gallbladder without cholecystitis without obstruction: Secondary | ICD-10-CM | POA: Insufficient documentation

## 2017-03-12 DIAGNOSIS — Z79899 Other long term (current) drug therapy: Secondary | ICD-10-CM | POA: Insufficient documentation

## 2017-03-12 DIAGNOSIS — D329 Benign neoplasm of meninges, unspecified: Secondary | ICD-10-CM

## 2017-03-12 DIAGNOSIS — D3501 Benign neoplasm of right adrenal gland: Secondary | ICD-10-CM | POA: Diagnosis not present

## 2017-03-12 DIAGNOSIS — R930 Abnormal findings on diagnostic imaging of skull and head, not elsewhere classified: Secondary | ICD-10-CM | POA: Diagnosis not present

## 2017-03-12 LAB — GLUCOSE, CAPILLARY: GLUCOSE-CAPILLARY: 87 mg/dL (ref 65–99)

## 2017-03-12 MED ORDER — IOPAMIDOL (ISOVUE-300) INJECTION 61%
INTRAVENOUS | Status: AC
  Filled 2017-03-12: qty 75

## 2017-03-12 MED ORDER — FLUDEOXYGLUCOSE F - 18 (FDG) INJECTION
10.3800 | Freq: Once | INTRAVENOUS | Status: AC | PRN
  Administered 2017-03-12: 10.38 via INTRAVENOUS

## 2017-03-12 MED ORDER — IOPAMIDOL (ISOVUE-300) INJECTION 61%
75.0000 mL | Freq: Once | INTRAVENOUS | Status: AC | PRN
  Administered 2017-03-12: 75 mL via INTRAVENOUS

## 2017-03-14 ENCOUNTER — Telehealth: Payer: Self-pay | Admitting: Internal Medicine

## 2017-03-14 NOTE — Telephone Encounter (Signed)
Spoke with patient to confirm 03/15/17 appointment for [place]

## 2017-03-15 ENCOUNTER — Encounter: Payer: Self-pay | Admitting: *Deleted

## 2017-03-15 ENCOUNTER — Other Ambulatory Visit (HOSPITAL_BASED_OUTPATIENT_CLINIC_OR_DEPARTMENT_OTHER): Payer: Medicare Other

## 2017-03-15 ENCOUNTER — Encounter: Payer: Medicare Other | Admitting: Cardiothoracic Surgery

## 2017-03-15 ENCOUNTER — Encounter: Payer: Self-pay | Admitting: Internal Medicine

## 2017-03-15 ENCOUNTER — Telehealth: Payer: Self-pay | Admitting: Radiation Oncology

## 2017-03-15 ENCOUNTER — Telehealth: Payer: Self-pay | Admitting: Internal Medicine

## 2017-03-15 ENCOUNTER — Inpatient Hospital Stay: Payer: Medicare Other | Attending: Internal Medicine | Admitting: Internal Medicine

## 2017-03-15 VITALS — BP 107/49 | HR 93 | Temp 98.6°F | Resp 18 | Ht 69.0 in | Wt 191.2 lb

## 2017-03-15 DIAGNOSIS — C382 Malignant neoplasm of posterior mediastinum: Secondary | ICD-10-CM

## 2017-03-15 DIAGNOSIS — C3481 Malignant neoplasm of overlapping sites of right bronchus and lung: Secondary | ICD-10-CM

## 2017-03-15 DIAGNOSIS — E876 Hypokalemia: Secondary | ICD-10-CM

## 2017-03-15 DIAGNOSIS — R0602 Shortness of breath: Secondary | ICD-10-CM

## 2017-03-15 DIAGNOSIS — I871 Compression of vein: Secondary | ICD-10-CM

## 2017-03-15 DIAGNOSIS — R9402 Abnormal brain scan: Secondary | ICD-10-CM

## 2017-03-15 LAB — CBC WITH DIFFERENTIAL/PLATELET
BASO%: 0.4 % (ref 0.0–2.0)
BASOS ABS: 0 10*3/uL (ref 0.0–0.1)
EOS%: 0.7 % (ref 0.0–7.0)
Eosinophils Absolute: 0.1 10*3/uL (ref 0.0–0.5)
HEMATOCRIT: 37.8 % (ref 34.8–46.6)
HGB: 11.8 g/dL (ref 11.6–15.9)
LYMPH#: 1.8 10*3/uL (ref 0.9–3.3)
LYMPH%: 25 % (ref 14.0–49.7)
MCH: 28.9 pg (ref 25.1–34.0)
MCHC: 31.2 g/dL — AB (ref 31.5–36.0)
MCV: 92.4 fL (ref 79.5–101.0)
MONO#: 0.7 10*3/uL (ref 0.1–0.9)
MONO%: 9.7 % (ref 0.0–14.0)
NEUT#: 4.7 10*3/uL (ref 1.5–6.5)
NEUT%: 64.2 % (ref 38.4–76.8)
Platelets: 215 10*3/uL (ref 145–400)
RBC: 4.09 10*6/uL (ref 3.70–5.45)
RDW: 15.3 % — AB (ref 11.2–14.5)
WBC: 7.2 10*3/uL (ref 3.9–10.3)

## 2017-03-15 LAB — COMPREHENSIVE METABOLIC PANEL
ALBUMIN: 3.3 g/dL — AB (ref 3.5–5.0)
ALK PHOS: 88 U/L (ref 40–150)
ALT: 15 U/L (ref 0–55)
AST: 16 U/L (ref 5–34)
Anion Gap: 8 mEq/L (ref 3–11)
BUN: 17.8 mg/dL (ref 7.0–26.0)
CO2: 34 meq/L — AB (ref 22–29)
Calcium: 9.6 mg/dL (ref 8.4–10.4)
Chloride: 101 mEq/L (ref 98–109)
Creatinine: 0.8 mg/dL (ref 0.6–1.1)
EGFR: >60 mL/min/{1.73_m2} (ref >60)
GLUCOSE: 92 mg/dL (ref 70–140)
POTASSIUM: 3.7 meq/L (ref 3.5–5.1)
SODIUM: 143 meq/L (ref 136–145)
TOTAL PROTEIN: 6.9 g/dL (ref 6.4–8.3)
Total Bilirubin: 0.52 mg/dL (ref 0.20–1.20)

## 2017-03-15 NOTE — Telephone Encounter (Signed)
Gave avs and calendar for february °

## 2017-03-15 NOTE — Telephone Encounter (Signed)
I called and spoke with the patient to let her know the results from her PET scan. We would like to offer another course of radiotherapy to total 60 Gy range while she awaits systemic therapy. I met with her also during her visit with Dr. Julien Nordmann and we reviewed consent. A copy was provided to the patient as well.

## 2017-03-15 NOTE — Progress Notes (Signed)
[place] [phone]   [phone] Multidisciplinary thoracic oncology clinic  OFFICE PROGRESS NOTE  Patient, No Pcp Per No address on file  DIAGNOSIS: Stage IIIA (T3, N2, M0) non-small cell lung cancer diagnosed in November 2018 and presented with large right suprahilar and mediastinal mass.  PRIOR THERAPY: Palliative radiotherapy to the large mediastinal mass under the care of Dr. Lisbeth Renshaw.  CURRENT THERAPY: None  INTERVAL HISTORY: Cassidy Sanders 77 y.o. female returns to the clinic today for follow-up visit accompanied by HER-2 daughters.  The patient is feeling fine today except for the baseline shortness of breath.  She notes a significant improvement in her breathing after starting the course of palliative radiotherapy to the mediastinal mass.  She is expected to have 3 more weeks of radiation under the care of Dr. Lisbeth Renshaw.  The patient denied having any current chest pain continues to have mild cough with no hemoptysis.  She denied having any recent weight loss or night sweats.  She has no nausea, vomiting, diarrhea or constipation.  She has no headache or visual changes.  She had a PET scan as well as CT scan of the head performed recently and she is here for evaluation and discussion of her scan results and treatment options.  MEDICAL HISTORY: Past Medical History:  Diagnosis Date  . Brain tumor (benign) ([place])   . Depression   . Hypertension     ALLERGIES:  is allergic to latex; gabapentin; and tape.  MEDICATIONS:  Current Outpatient Medications  Medication Sig Dispense Refill  . Biotin 2.5 MG TABS Take 2,500 mcg by mouth daily.    Marland Kitchen dexamethasone (DECADRON) 2 MG tablet Take 1 tab bid x 5d, then 1 tab daily x5d, then 1/2 tab x 6d and stop 18 tablet 0  . metoprolol tartrate (LOPRESSOR) 50 MG tablet Take 0.5 tablets (25 mg total) by mouth 2 (two) times daily. 30 tablet 11  . Multiple Vitamins-Minerals (ADULT GUMMY) CHEW Chew 1 tablet by  mouth daily.    . sertraline (ZOLOFT) 50 MG tablet Take 50 mg by mouth daily.    . traZODone (DESYREL) 50 MG tablet Take 1 tablet (50 mg total) by mouth at bedtime as needed for sleep. 30 tablet 0  . vitamin E 400 UNIT capsule Take 400 Units by mouth daily.     No current facility-administered medications for this visit.     SURGICAL HISTORY:  Past Surgical History:  Procedure Laterality Date  . ABDOMINAL HYSTERECTOMY    . BLADDER SURGERY    . BRAIN SURGERY    . ENDOBRONCHIAL ULTRASOUND Bilateral 02/06/2017   Procedure: ENDOBRONCHIAL ULTRASOUND;  Surgeon: Juanito Doom, MD;  Location: [facility];  Service: Cardiopulmonary;  Laterality: Bilateral;    REVIEW OF SYSTEMS:  Constitutional: positive for fatigue Eyes: negative Ears, nose, mouth, throat, and face: negative Respiratory: positive for cough and dyspnea on exertion Cardiovascular: negative Gastrointestinal: negative Genitourinary:negative Integument/breast: negative Hematologic/lymphatic: negative Musculoskeletal:negative Neurological: negative Behavioral/Psych: negative Endocrine: negative Allergic/Immunologic: negative   PHYSICAL EXAMINATION: General appearance: alert, cooperative, fatigued and no distress Head: Normocephalic, without obvious abnormality, atraumatic Neck: no adenopathy, no JVD, supple, symmetrical, trachea midline and thyroid not enlarged, symmetric, no tenderness/mass/nodules Lymph nodes: Cervical, supraclavicular, and axillary nodes normal. Resp: wheezes bilaterally Back: symmetric, no curvature. ROM normal. No CVA tenderness. Cardio: regular rate and rhythm, S1, S2 normal, no murmur, click, rub or gallop GI: soft, non-tender; bowel sounds normal; no masses,  no organomegaly Extremities: extremities normal,  atraumatic, no cyanosis or edema Neurologic: Alert and oriented X 3, normal strength and tone. Normal symmetric reflexes. Normal coordination and gait  ECOG PERFORMANCE STATUS: 1 -  Symptomatic but completely ambulatory  Blood pressure (!) 107/49, pulse 93, temperature 98.6 F (37 C), temperature source Oral, resp. rate 18, height '5\' 9"'  (1.753 m), weight 191 lb 3.2 oz (86.7 kg), SpO2 100 %.  LABORATORY DATA: Lab Results  Component Value Date   WBC 7.2 03/15/2017   HGB 11.8 03/15/2017   HCT 37.8 03/15/2017   MCV 92.4 03/15/2017   PLT 215 03/15/2017      Chemistry      Component Value Date/Time   NA 143 03/15/2017 1436   K 3.7 03/15/2017 1436   CL 99 (L) 02/12/2017 0321   CO2 34 (H) 03/15/2017 1436   BUN 17.8 03/15/2017 1436   CREATININE 0.8 03/15/2017 1436      Component Value Date/Time   CALCIUM 9.6 03/15/2017 1436   ALKPHOS 88 03/15/2017 1436   AST 16 03/15/2017 1436   ALT 15 03/15/2017 1436   BILITOT 0.52 03/15/2017 1436       RADIOGRAPHIC STUDIES: Ct Head W Wo Contrast  Result Date: 03/12/2017 CLINICAL DATA:  Staging non-small-cell lung cancer. EXAM: CT HEAD WITHOUT AND WITH CONTRAST TECHNIQUE: Contiguous axial images were obtained from the base of the skull through the vertex without and with intravenous contrast CONTRAST:  77m ISOVUE-300 IOPAMIDOL (ISOVUE-300) INJECTION 61% COMPARISON:  None. FINDINGS: Brain: No definite metastatic disease. There is a background pattern of chronic small vessel ischemic changes of the basal ganglia and hemispheric white matter. There are 3 findings were the of discussion. There has been previous right frontal craniotomy and there is atrophy and gliosis in the underlying right frontal lobe with some dural calcification. There is an apparently extra-axial abnormality to the left of the falx in the frontal region that measures about 7 mm in diameter and may enhance slightly. Differential diagnosis for this is meningioma versus aneurysm versus metastasis. There is a punctate hyperdensity in the left frontal lobe on axial image 22 which is indeterminate. This could be a vessel or a punctate metastasis. No hydrocephalus. No  subdural collection. Vascular: There is atherosclerotic calcification of the major vessels at the base of the brain. Skull: Right frontal craniotomy changes.  No acute bone finding. Sinuses/Orbits: Clear/normal Other: None IMPRESSION: No definite metastatic disease. Punctate bright focus in the left frontal lobe on image 22 could be a surface vessel or tiny metastasis. 7 mm probably extra-axial abnormality along the left side of the falx in the frontal region axial image 19 could represent the small meningioma, aneurysm or surface metastasis. Right frontal atrophy and gliosis with dural calcification but no evidence of mass. These abnormalities could be followed or investigated further with MRI. Electronically Signed   By: MNelson ChimesM.D.   On: 03/12/2017 14:02   Nm Pet Image Initial (pi) Skull Base To Thigh  Result Date: 03/12/2017 CLINICAL DATA:  Initial treatment strategy for non-small cell lung cancer. EXAM: NUCLEAR MEDICINE PET SKULL BASE TO THIGH TECHNIQUE: 10.38 mCi F-18 FDG was injected intravenously. Full-ring PET imaging was performed from the skull base to thigh after the radiotracer. CT data was obtained and used for attenuation correction and anatomic localization. FASTING BLOOD GLUCOSE:  Value: 87 mg/dl COMPARISON:  CTA chest dated 01/31/2017. CT abdomen/ pelvis dated 02/01/2017. FINDINGS: NECK: No hypermetabolic cervical lymphadenopathy. 16 mm exophytic right posterior thyroid nodule (series 4/ image 48), non FDG  avid. CHEST: 5.0 x 6.0 cm low right paratracheal mass (series 4/image 70), encircling the right mainstem bronchus and bronchus intermedius, and extending into the subcarinal region. Max SUV 17.5. On prior CT, this measured 4.9 x 6.5 cm. Mass abuts the SVC with collateralization noted on unenhanced CT (series 4/image 67). Evaluation of the lung parenchyma is constrained by respiratory motion. Within that constraint, there are no suspicious parenchymal pulmonary nodules. Compressive  atelectasis along the medial aspect of the right upper lobe (series 8/ image 22). Small right pleural effusion, new. No pneumothorax. ABDOMEN/PELVIS: No abnormal hypermetabolic activity within the liver, spleen, or pancreas. 1.4 cm low-density right adrenal nodule (series 4/ image 105) and 2.0 cm low-density left adrenal nodule (series 4/ image 110), without hypermetabolism on PET, likely reflecting benign adrenal adenomas. No hypermetabolic abdominopelvic lymphadenopathy. Cholelithiasis (series 4/ image 123). Excretory contrast in the bilateral renal collecting systems and bladder. Atherosclerotic calcifications the abdominal aorta and branch vessels. SKELETON: Scattered areas of hypermetabolism within the visualized lower thoracic and lumbar spine, diffuse. No focal hypermetabolic activity to suggest skeletal metastasis. IMPRESSION: 6.0 cm low right paratracheal mass, encircling the right mainstem bronchus and bronchus intermedius, and extending into the subcarinal region. This is compatible with the patient's known primary bronchogenic neoplasm. No findings specific for metastatic disease. Small right pleural effusion, new. Bilateral adrenal adenomas.  Cholelithiasis. Electronically Signed   By: Julian Hy M.D.   On: 03/12/2017 15:52    ASSESSMENT AND PLAN: This is a very pleasant 77 years old white female recently diagnosed with a stage IIIa non-small cell lung cancer. The subtype is unknown at this point because of the insufficient material but highly suspicious for adenocarcinoma. The patient had a recent PET scan as well as CT scan of the head.  I personally and independently reviewed the scan images and discussed the results and showed the images to the patient and her family.  Her PET scan showed no concerning findings for disease progression outside the known right paratracheal mass in circling the right mainstem bronchus which slightly decreased in size compared to the previous imaging studies  after starting palliative radiotherapy. Her CT scan of the head showed no definite finding for metastatic disease to the brain but there was punctate right focus in the left frontal lobe could be blood vessel versus early metastasis.  This will need close monitoring in the future. The tissue sample of her biopsy was insufficient for any molecular studies.  I discussed with Dr. Lake Bells repeating the bronchoscopy to get more tissue for molecular studies and identifying the subtype of the lung cancer. I recommended for the patient to continue with the palliative radiotherapy as recommended by Dr. Lisbeth Renshaw. Once the patient complete this course of treatment which is expected to be done in 3 weeks, I will meet with her for discussion of systemic treatment options including systemic chemotherapy versus immunotherapy versus targeted therapy depending on her molecular studies. One of the daughter is bringing the issue of starting her mother on Budwig diet which is a mixture of flaxseed oil and cottage cheese claiming that it would cure her cancer.  I explained to the patient and her daughter that I do not have any clinical studies supporting the use of this diet but I would certainly advise against it if the patient is start any systemic chemotherapy or immunotherapy in the near future for fear of interactions. I will see the patient back for follow-up visit in 4 weeks for reevaluation after she completed the  course of radiotherapy and hopefully the molecular studies will be available by that time. For the hypokalemia, the patient was encouraged to increase her potassium rich diet. She was advised to call immediately if she has any concerning symptoms in the interval. The patient voices understanding of current disease status and treatment options and is in agreement with the current care plan.  All questions were answered. The patient knows to call the clinic with any problems, questions or concerns. We can  certainly see the patient much sooner if necessary.  I spent 20 minutes counseling the patient face to face. The total time spent in the appointment was 30 minutes.  Disclaimer: This note was dictated with voice recognition software. Similar sounding words can inadvertently be transcribed and may not be corrected upon review.

## 2017-03-16 ENCOUNTER — Ambulatory Visit
Admission: RE | Admit: 2017-03-16 | Discharge: 2017-03-16 | Disposition: A | Discharge status: Home / Self Care | Payer: Medicare Other | Source: Ambulatory Visit | Attending: Radiation Oncology | Admitting: Radiation Oncology

## 2017-03-16 DIAGNOSIS — Z510 Encounter for antineoplastic radiation therapy: Secondary | ICD-10-CM | POA: Diagnosis not present

## 2017-03-16 DIAGNOSIS — C382 Malignant neoplasm of posterior mediastinum: Secondary | ICD-10-CM | POA: Insufficient documentation

## 2017-03-17 DIAGNOSIS — Z510 Encounter for antineoplastic radiation therapy: Secondary | ICD-10-CM | POA: Diagnosis not present

## 2017-03-17 DIAGNOSIS — C382 Malignant neoplasm of posterior mediastinum: Secondary | ICD-10-CM | POA: Diagnosis not present

## 2017-03-21 ENCOUNTER — Ambulatory Visit
Admission: RE | Admit: 2017-03-21 | Discharge: 2017-03-21 | Disposition: A | Discharge status: Home / Self Care | Payer: Medicare Other | Source: Ambulatory Visit | Attending: Radiation Oncology | Admitting: Radiation Oncology

## 2017-03-21 DIAGNOSIS — Z510 Encounter for antineoplastic radiation therapy: Secondary | ICD-10-CM | POA: Diagnosis not present

## 2017-03-21 DIAGNOSIS — C382 Malignant neoplasm of posterior mediastinum: Secondary | ICD-10-CM | POA: Diagnosis not present

## 2017-03-22 ENCOUNTER — Ambulatory Visit
Admission: RE | Admit: 2017-03-22 | Discharge: 2017-03-22 | Disposition: A | Discharge status: Home / Self Care | Payer: Medicare Other | Source: Ambulatory Visit | Attending: Radiation Oncology | Admitting: Radiation Oncology

## 2017-03-22 ENCOUNTER — Encounter: Payer: Self-pay | Admitting: Adult Health

## 2017-03-22 ENCOUNTER — Ambulatory Visit (INDEPENDENT_AMBULATORY_CARE_PROVIDER_SITE_OTHER): Payer: Medicare Other | Admitting: Adult Health

## 2017-03-22 DIAGNOSIS — C382 Malignant neoplasm of posterior mediastinum: Secondary | ICD-10-CM

## 2017-03-22 DIAGNOSIS — Z510 Encounter for antineoplastic radiation therapy: Secondary | ICD-10-CM | POA: Diagnosis not present

## 2017-03-22 DIAGNOSIS — J9611 Chronic respiratory failure with hypoxia: Secondary | ICD-10-CM

## 2017-03-22 NOTE — Assessment & Plan Note (Signed)
Large mediastinal mass-  non-small cell carcinoma.  Patient will need further tissue sampling for definitive diagnosis in order to begin targeted  Therapy. Case discussed with Dr. Lake Bells and patient.  Patient will be set up for a repeat E BUS /Bronchoscopy.    Plan Patient Instructions  Continue on oxygen .  Set up for Bronchoscopy as discussed  Follow up with Dr. Lake Bells in 4-6 weeks and As needed

## 2017-03-22 NOTE — Progress Notes (Signed)
Unable to reach pt: lvm with pre-op instructions on pt phone. Pt made aware to stop taking vitamins, fish oil, Biotin and herbal medications. Please complete assessment on DOS.

## 2017-03-22 NOTE — Patient Instructions (Signed)
Continue on oxygen .  Set up for Bronchoscopy as discussed  Follow up with Dr. Lake Bells in 4-6 weeks and As needed

## 2017-03-22 NOTE — Addendum Note (Signed)
Addended by: Parke Poisson E on: 03/22/2017 04:20 PM   Modules accepted: Orders

## 2017-03-22 NOTE — Assessment & Plan Note (Signed)
Cont on O2  Order for POC sent to DME

## 2017-03-22 NOTE — Progress Notes (Signed)
@Patient  ID: Cassidy Sanders, female    DOB: 07-Jul-1940, 77 y.o.   MRN: [id number]  Chief Complaint  Patient presents with  . Follow-up    Referring provider: No ref. provider found  HPI: 77 yo female former smoker with  stage IIIa non-small cell lung cancer diagnosed November 2018. Initial presentation with large right suprahilar and mediastinal mass. She is followed by oncology undergoing palliative radiotherapy.   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  Narrowing of the right main pulmonary artery and branches to the right upper lobe and luminal narrowing of the right mainstem bronchus.  PET scan March 12, 2017 shows a 6.0 cm low right paratracheal mass, encircling the right mainstem bronchus and bronchus intermedius and extending into the subcarinal region No findings specific for metastatic disease..  Small right pleural effusion.   03/22/2017 [facility] follow up : Lung cancer  Patient presents for a post hospital follow-up.  Patient was recently seen in the hospital after a large central mass was noted.  Patient had been having increasing dyspnea.  CT chest noted a large 5.8.  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  Patient was admitted.  She did have notable hemoptysis and hypoxia.  She underwent a diagnostic bronchoscopy, EBUS.  Procedure was difficult and complicated by hypotension and respiratory failure.  Unable to get sufficient biopsy.  Brushings were sent.  Initial brushings consistent with non-small cell carcinoma.  Patient has started on palliative radiotherapy.  PET scan done on March 12, 2017 showed 6 cm right paratracheal mass.  No evidence of further metastatic disease.  CT head  did not show any specific metastasis but a punctate focus in the left frontal lobe was noted.  Will need further follow-up.   Patient has been recommended for possible repeat bronchoscopy to obtain tissue samples in order to again targeted therapy  She is not on blood thinners. No frank hemoptysis . Occasional blood tinged mucus.   She says she is doing better with less dyspnea .She is on Oxygen 2l/m . Really helps but hard to carry tanks around .    Allergies  Allergen Reactions  . Latex Dermatitis  . Gabapentin     Excessive sleep   . Tape Rash    Immunization History  Administered Date(s) Administered  . Influenza, High Dose Seasonal PF 12/26/2016  . Influenza-Unspecified 12/12/2011, 12/21/2014, 12/13/2015  . Pneumococcal Conjugate-13 12/11/2013  . Pneumococcal Polysaccharide-23 12/11/2012  . Td 03/13/1998    Past Medical History:  Diagnosis Date  . Brain tumor (benign) ([place])   . Depression   . Hypertension     Tobacco History: Social History   Tobacco Use  Smoking Status Former Smoker  . Packs/day: 1.00  . Years: 59.00  . Pack years: 59.00  . Types: Cigarettes  . Last attempt to quit: 06/11/2012  . Years since quitting: 4.7  Smokeless Tobacco Never Used   Counseling given: Not Answered   Outpatient Encounter Medications as of 03/22/2017  Medication Sig  . Biotin 2.5 MG TABS Take 2,500 mcg by mouth daily.  . metoprolol tartrate (LOPRESSOR) 50 MG tablet Take 0.5 tablets (25 mg total) by mouth 2 (two) times daily.  . Multiple Vitamins-Minerals (ADULT GUMMY) CHEW Chew 1 tablet by mouth  daily.  . sertraline (ZOLOFT) 50 MG tablet Take 50 mg by mouth daily.  . vitamin E 400 UNIT capsule Take 400 Units by mouth daily.  . traZODone (DESYREL) 50 MG tablet Take 1 tablet (50 mg total) by mouth at bedtime as needed for sleep. (Patient not taking: Reported on 03/22/2017)  . [DISCONTINUED] dexamethasone (DECADRON) 2 MG tablet Take 1 tab bid x 5d, then 1 tab daily x5d, then  1/2 tab x 6d and stop (Patient not taking: Reported on 03/22/2017)   No facility-administered encounter medications on file as of 03/22/2017.      Review of Systems  Constitutional:   No  weight loss, night sweats,  Fevers, chills, +fatigue, or  lassitude.  HEENT:   No headaches,  Difficulty swallowing,  Tooth/dental problems, or  Sore throat,                No sneezing, itching, ear ache, nasal congestion, post nasal drip,   CV:  No chest pain,  Orthopnea, PND, swelling in lower extremities, anasarca, dizziness, palpitations, syncope.   GI  No heartburn, indigestion, abdominal pain, nausea, vomiting, diarrhea, change in bowel habits, loss of appetite, bloody stools.   Resp:   No excess mucus, no productive cough,  No non-productive cough,  No coughing up of blood.  No change in color of mucus.  No wheezing.  No chest wall deformity  Skin: no rash or lesions.  GU: no dysuria, change in color of urine, no urgency or frequency.  No flank pain, no hematuria   MS:  No joint pain or swelling.  No decreased range of motion.  No back pain.    Physical Exam  BP 116/60 (BP Location: Left Arm, Cuff Size: Normal)   Pulse 89   Ht 5\' 9"  (1.753 m)   Wt 194 lb (88 kg)   SpO2 93%   BMI 28.65 kg/m    GEN: A/Ox3; pleasant , NAD, elderly on O2 in wc    HEENT:  [place]/AT,  EACs-clear, TMs-wnl, NOSE-clear, THROAT-clear, no lesions, no postnasal drip or exudate noted.   NECK:  Supple w/ fair ROM; no JVD; normal carotid impulses w/o bruits; no thyromegaly or nodules palpated; no lymphadenopathy.    RESP  Clear  P & A; w/o, wheezes/ rales/ or rhonchi. no accessory muscle use, no dullness to percussion  CARD:  RRR, no m/r/g, no peripheral edema, pulses intact, no cyanosis or clubbing.  GI:   Soft & nt; nml bowel sounds; no organomegaly or masses detected.   Musco: Warm bil, no deformities or joint swelling noted.   Neuro: alert, no focal deficits noted.    Skin: Warm, no lesions or  rashes    Lab Results:  CBC    Component Value Date/Time   WBC 7.2 03/15/2017 1436   WBC 10.0 02/11/2017 0336   RBC 4.09 03/15/2017 1436   RBC 4.22 02/11/2017 0336   HGB 11.8 03/15/2017 1436   HCT 37.8 03/15/2017 1436   PLT 215 03/15/2017 1436   MCV 92.4 03/15/2017 1436   MCH 28.9 03/15/2017 1436   MCH 28.9 02/11/2017 0336   MCHC 31.2 (L) 03/15/2017 1436   MCHC 31.4 02/11/2017 0336   RDW 15.3 (H) 03/15/2017 1436   LYMPHSABS 1.8 03/15/2017 1436   MONOABS 0.7 03/15/2017 1436   EOSABS 0.1 03/15/2017 1436   BASOSABS 0.0 03/15/2017 1436    BMET    Component Value Date/Time   NA 143 03/15/2017 1436   K 3.7 03/15/2017 1436  CL 99 (L) 02/12/2017 0321   CO2 34 (H) 03/15/2017 1436   GLUCOSE 92 03/15/2017 1436   BUN 17.8 03/15/2017 1436   CREATININE 0.8 03/15/2017 1436   CALCIUM 9.6 03/15/2017 1436   GFRNONAA >60 02/12/2017 0321   GFRAA >60 02/12/2017 0321    BNP No results found for: BNP  ProBNP No results found for: PROBNP  Imaging: Ct Head W Wo Contrast  Result Date: 03/12/2017 CLINICAL DATA:  Staging non-small-cell lung cancer. EXAM: CT HEAD WITHOUT AND WITH CONTRAST TECHNIQUE: Contiguous axial images were obtained from the base of the skull through the vertex without and with intravenous contrast CONTRAST:  35mL ISOVUE-300 IOPAMIDOL (ISOVUE-300) INJECTION 61% COMPARISON:  None. FINDINGS: Brain: No definite metastatic disease. There is a background pattern of chronic small vessel ischemic changes of the basal ganglia and hemispheric white matter. There are 3 findings were the of discussion. There has been previous right frontal craniotomy and there is atrophy and gliosis in the underlying right frontal lobe with some dural calcification. There is an apparently extra-axial abnormality to the left of the falx in the frontal region that measures about 7 mm in diameter and may enhance slightly. Differential diagnosis for this is meningioma versus aneurysm versus metastasis.  There is a punctate hyperdensity in the left frontal lobe on axial image 22 which is indeterminate. This could be a vessel or a punctate metastasis. No hydrocephalus. No subdural collection. Vascular: There is atherosclerotic calcification of the major vessels at the base of the brain. Skull: Right frontal craniotomy changes.  No acute bone finding. Sinuses/Orbits: Clear/normal Other: None IMPRESSION: No definite metastatic disease. Punctate bright focus in the left frontal lobe on image 22 could be a surface vessel or tiny metastasis. 7 mm probably extra-axial abnormality along the left side of the falx in the frontal region axial image 19 could represent the small meningioma, aneurysm or surface metastasis. Right frontal atrophy and gliosis with dural calcification but no evidence of mass. These abnormalities could be followed or investigated further with MRI. Electronically Signed   By: Nelson Chimes M.D.   On: 03/12/2017 14:02   Nm Pet Image Initial (pi) Skull Base To Thigh  Result Date: 03/12/2017 CLINICAL DATA:  Initial treatment strategy for non-small cell lung cancer. EXAM: NUCLEAR MEDICINE PET SKULL BASE TO THIGH TECHNIQUE: 10.38 mCi F-18 FDG was injected intravenously. Full-ring PET imaging was performed from the skull base to thigh after the radiotracer. CT data was obtained and used for attenuation correction and anatomic localization. FASTING BLOOD GLUCOSE:  Value: 87 mg/dl COMPARISON:  CTA chest dated 01/31/2017. CT abdomen/ pelvis dated 02/01/2017. FINDINGS: NECK: No hypermetabolic cervical lymphadenopathy. 16 mm exophytic right posterior thyroid nodule (series 4/ image 48), non FDG avid. CHEST: 5.0 x 6.0 cm low right paratracheal mass (series 4/image 70), encircling the right mainstem bronchus and bronchus intermedius, and extending into the subcarinal region. Max SUV 17.5. On prior CT, this measured 4.9 x 6.5 cm. Mass abuts the SVC with collateralization noted on unenhanced CT (series 4/image  67). Evaluation of the lung parenchyma is constrained by respiratory motion. Within that constraint, there are no suspicious parenchymal pulmonary nodules. Compressive atelectasis along the medial aspect of the right upper lobe (series 8/ image 22). Small right pleural effusion, new. No pneumothorax. ABDOMEN/PELVIS: No abnormal hypermetabolic activity within the liver, spleen, or pancreas. 1.4 cm low-density right adrenal nodule (series 4/ image 105) and 2.0 cm low-density left adrenal nodule (series 4/ image 110), without hypermetabolism on PET, likely  reflecting benign adrenal adenomas. No hypermetabolic abdominopelvic lymphadenopathy. Cholelithiasis (series 4/ image 123). Excretory contrast in the bilateral renal collecting systems and bladder. Atherosclerotic calcifications the abdominal aorta and branch vessels. SKELETON: Scattered areas of hypermetabolism within the visualized lower thoracic and lumbar spine, diffuse. No focal hypermetabolic activity to suggest skeletal metastasis. IMPRESSION: 6.0 cm low right paratracheal mass, encircling the right mainstem bronchus and bronchus intermedius, and extending into the subcarinal region. This is compatible with the patient's known primary bronchogenic neoplasm. No findings specific for metastatic disease. Small right pleural effusion, new. Bilateral adrenal adenomas.  Cholelithiasis. Electronically Signed   By: Julian Hy M.D.   On: 03/12/2017 15:52     Assessment & Plan:   Malignant neoplasm of posterior mediastinum (HCC) Large mediastinal mass-  non-small cell carcinoma.  Patient will need further tissue sampling for definitive diagnosis in order to begin targeted  Therapy. Case discussed with Dr. Lake Bells and patient.  Patient will be set up for a repeat E BUS /Bronchoscopy.    Plan Patient Instructions  Continue on oxygen .  Set up for Bronchoscopy as discussed  Follow up with Dr. Lake Bells in 4-6 weeks and As needed        Chronic  respiratory failure with hypoxia ([place]) Cont on O2  Order for POC sent to DME      Rexene Edison, NP 03/22/2017

## 2017-03-23 ENCOUNTER — Encounter (HOSPITAL_COMMUNITY)
Admission: RE | Disposition: A | Discharge status: Home / Self Care | Payer: Self-pay | Source: Ambulatory Visit | Attending: Pulmonary Disease

## 2017-03-23 ENCOUNTER — Ambulatory Visit: Payer: Medicare Other

## 2017-03-23 ENCOUNTER — Ambulatory Visit (HOSPITAL_COMMUNITY): Payer: Medicare Other | Admitting: Certified Registered Nurse Anesthetist

## 2017-03-23 ENCOUNTER — Ambulatory Visit (HOSPITAL_COMMUNITY)
Admission: RE | Admit: 2017-03-23 | Discharge: 2017-03-23 | Disposition: A | Discharge status: Home / Self Care | Payer: Medicare Other | Source: Ambulatory Visit | Attending: Pulmonary Disease | Admitting: Pulmonary Disease

## 2017-03-23 ENCOUNTER — Encounter (HOSPITAL_COMMUNITY): Payer: Self-pay | Admitting: *Deleted

## 2017-03-23 DIAGNOSIS — C3490 Malignant neoplasm of unspecified part of unspecified bronchus or lung: Secondary | ICD-10-CM | POA: Diagnosis present

## 2017-03-23 DIAGNOSIS — R0602 Shortness of breath: Secondary | ICD-10-CM | POA: Diagnosis not present

## 2017-03-23 DIAGNOSIS — Z87891 Personal history of nicotine dependence: Secondary | ICD-10-CM | POA: Insufficient documentation

## 2017-03-23 DIAGNOSIS — R918 Other nonspecific abnormal finding of lung field: Secondary | ICD-10-CM

## 2017-03-23 DIAGNOSIS — C3491 Malignant neoplasm of unspecified part of right bronchus or lung: Secondary | ICD-10-CM | POA: Diagnosis not present

## 2017-03-23 DIAGNOSIS — C383 Malignant neoplasm of mediastinum, part unspecified: Secondary | ICD-10-CM | POA: Diagnosis not present

## 2017-03-23 DIAGNOSIS — J9859 Other diseases of mediastinum, not elsewhere classified: Secondary | ICD-10-CM | POA: Diagnosis not present

## 2017-03-23 DIAGNOSIS — I871 Compression of vein: Secondary | ICD-10-CM | POA: Diagnosis not present

## 2017-03-23 DIAGNOSIS — I10 Essential (primary) hypertension: Secondary | ICD-10-CM | POA: Insufficient documentation

## 2017-03-23 DIAGNOSIS — F329 Major depressive disorder, single episode, unspecified: Secondary | ICD-10-CM | POA: Insufficient documentation

## 2017-03-23 DIAGNOSIS — Z79899 Other long term (current) drug therapy: Secondary | ICD-10-CM | POA: Insufficient documentation

## 2017-03-23 DIAGNOSIS — J9621 Acute and chronic respiratory failure with hypoxia: Secondary | ICD-10-CM | POA: Diagnosis not present

## 2017-03-23 HISTORY — PX: VIDEO BRONCHOSCOPY WITH ENDOBRONCHIAL ULTRASOUND: SHX6177

## 2017-03-23 LAB — PROTIME-INR
INR: 1
PROTHROMBIN TIME: 13.1 s (ref 11.4–15.2)

## 2017-03-23 LAB — COMPREHENSIVE METABOLIC PANEL
ALT: 14 U/L (ref 14–54)
AST: 24 U/L (ref 15–41)
Albumin: 3.3 g/dL — ABNORMAL LOW (ref 3.5–5.0)
Alkaline Phosphatase: 78 U/L (ref 38–126)
Anion gap: 10 (ref 5–15)
BUN: 14 mg/dL (ref 6–20)
CHLORIDE: 103 mmol/L (ref 101–111)
CO2: 28 mmol/L (ref 22–32)
CREATININE: 0.64 mg/dL (ref 0.44–1.00)
Calcium: 9.3 mg/dL (ref 8.9–10.3)
GFR calc non Af Amer: >60 mL/min (ref >60)
Glucose, Bld: 107 mg/dL — ABNORMAL HIGH (ref 65–99)
POTASSIUM: 3.2 mmol/L — AB (ref 3.5–5.1)
SODIUM: 141 mmol/L (ref 135–145)
Total Bilirubin: 0.8 mg/dL (ref 0.3–1.2)
Total Protein: 6.7 g/dL (ref 6.5–8.1)

## 2017-03-23 LAB — CBC
HCT: 38.3 % (ref 36.0–46.0)
Hemoglobin: 12.2 g/dL (ref 12.0–15.0)
MCH: 28.6 pg (ref 26.0–34.0)
MCHC: 31.9 g/dL (ref 30.0–36.0)
MCV: 89.7 fL (ref 78.0–100.0)
PLATELETS: 199 10*3/uL (ref 150–400)
RBC: 4.27 MIL/uL (ref 3.87–5.11)
RDW: 15.1 % (ref 11.5–15.5)
WBC: 6.5 10*3/uL (ref 4.0–10.5)

## 2017-03-23 LAB — APTT: APTT: 32 s (ref 24–36)

## 2017-03-23 SURGERY — BRONCHOSCOPY, WITH EBUS
Anesthesia: General

## 2017-03-23 MED ORDER — SUGAMMADEX SODIUM 200 MG/2ML IV SOLN
INTRAVENOUS | Status: AC
  Filled 2017-03-23: qty 4

## 2017-03-23 MED ORDER — PROPOFOL 10 MG/ML IV BOLUS
INTRAVENOUS | Status: DC | PRN
  Administered 2017-03-23: 140 mg via INTRAVENOUS

## 2017-03-23 MED ORDER — PHENYLEPHRINE 40 MCG/ML (10ML) SYRINGE FOR IV PUSH (FOR BLOOD PRESSURE SUPPORT)
PREFILLED_SYRINGE | INTRAVENOUS | Status: AC
  Filled 2017-03-23: qty 20

## 2017-03-23 MED ORDER — ONDANSETRON HCL 4 MG/2ML IJ SOLN: 4.0000 mg | Freq: Once | INTRAMUSCULAR | Status: DC | PRN

## 2017-03-23 MED ORDER — SUGAMMADEX SODIUM 200 MG/2ML IV SOLN
INTRAVENOUS | Status: DC | PRN
  Administered 2017-03-23: 150 mg via INTRAVENOUS
  Administered 2017-03-23: 200 mg via INTRAVENOUS

## 2017-03-23 MED ORDER — PHENYLEPHRINE HCL 10 MG/ML IJ SOLN
INTRAVENOUS | Status: DC | PRN
  Administered 2017-03-23: 25 ug/min via INTRAVENOUS

## 2017-03-23 MED ORDER — ONDANSETRON HCL 4 MG/2ML IJ SOLN
INTRAMUSCULAR | Status: AC
Start: 2017-03-23 — End: ?
  Filled 2017-03-23: qty 4

## 2017-03-23 MED ORDER — ROCURONIUM BROMIDE 10 MG/ML (PF) SYRINGE
PREFILLED_SYRINGE | INTRAVENOUS | Status: AC
  Filled 2017-03-23: qty 10

## 2017-03-23 MED ORDER — PHENYLEPHRINE HCL 10 MG/ML IJ SOLN
INTRAMUSCULAR | Status: DC | PRN
  Administered 2017-03-23 (×2): 120 ug via INTRAVENOUS

## 2017-03-23 MED ORDER — HYDROCOD POLST-CPM POLST ER 10-8 MG/5ML PO SUER: 5.0000 mL | Freq: Two times a day (BID) | ORAL | Status: DC | PRN

## 2017-03-23 MED ORDER — LIDOCAINE 2% (20 MG/ML) 5 ML SYRINGE
INTRAMUSCULAR | Status: DC | PRN
  Administered 2017-03-23: 80 mg via INTRAVENOUS

## 2017-03-23 MED ORDER — ONDANSETRON HCL 4 MG/2ML IJ SOLN
INTRAMUSCULAR | Status: DC | PRN
  Administered 2017-03-23: 4 mg via INTRAVENOUS

## 2017-03-23 MED ORDER — HYDROCOD POLST-CPM POLST ER 10-8 MG/5ML PO SUER: 5.0000 mL | Freq: Two times a day (BID) | ORAL | 0 refills | Status: DC | PRN

## 2017-03-23 MED ORDER — LACTATED RINGERS IV SOLN: INTRAVENOUS | Status: DC

## 2017-03-23 MED ORDER — FENTANYL CITRATE (PF) 250 MCG/5ML IJ SOLN
INTRAMUSCULAR | Status: DC | PRN
  Administered 2017-03-23: 100 ug via INTRAVENOUS

## 2017-03-23 MED ORDER — 0.9 % SODIUM CHLORIDE (POUR BTL) OPTIME
TOPICAL | Status: DC | PRN
  Administered 2017-03-23: 1000 mL

## 2017-03-23 MED ORDER — EPINEPHRINE PF 1 MG/ML IJ SOLN
INTRAMUSCULAR | Status: AC
  Filled 2017-03-23: qty 1

## 2017-03-23 MED ORDER — HYDROMORPHONE HCL 1 MG/ML IJ SOLN: 0.2500 mg | INTRAMUSCULAR | Status: DC | PRN

## 2017-03-23 MED ORDER — FENTANYL CITRATE (PF) 250 MCG/5ML IJ SOLN
INTRAMUSCULAR | Status: AC
  Filled 2017-03-23: qty 5

## 2017-03-23 MED ORDER — LIDOCAINE 2% (20 MG/ML) 5 ML SYRINGE
INTRAMUSCULAR | Status: AC
  Filled 2017-03-23: qty 15

## 2017-03-23 MED ORDER — MEPERIDINE HCL 25 MG/ML IJ SOLN: 6.2500 mg | INTRAMUSCULAR | Status: DC | PRN

## 2017-03-23 MED ORDER — LACTATED RINGERS IV SOLN
INTRAVENOUS | Status: DC | PRN
  Administered 2017-03-23 (×2): via INTRAVENOUS

## 2017-03-23 MED ORDER — PROPOFOL 10 MG/ML IV BOLUS
INTRAVENOUS | Status: AC
Start: 2017-03-23 — End: ?
  Filled 2017-03-23: qty 20

## 2017-03-23 MED ORDER — ROCURONIUM BROMIDE 10 MG/ML (PF) SYRINGE
PREFILLED_SYRINGE | INTRAVENOUS | Status: DC | PRN
  Administered 2017-03-23: 50 mg via INTRAVENOUS
  Administered 2017-03-23: 20 mg via INTRAVENOUS
  Administered 2017-03-23: 10 mg via INTRAVENOUS

## 2017-03-23 MED ORDER — EPINEPHRINE PF 1 MG/ML IJ SOLN
INTRAMUSCULAR | Status: DC | PRN
  Administered 2017-03-23: 1 mg via ENDOTRACHEOPULMONARY

## 2017-03-23 SURGICAL SUPPLY — 24 items
BRUSH CYTOL CELLEBRITY 1.5X140 (MISCELLANEOUS) IMPLANT
CANISTER SUCT 3000ML PPV (MISCELLANEOUS) ×3 IMPLANT
CONT SPEC 4OZ CLIKSEAL STRL BL (MISCELLANEOUS) ×3 IMPLANT
COVER BACK TABLE 60X90IN (DRAPES) ×3 IMPLANT
COVER DOME SNAP 22 D (MISCELLANEOUS) ×3 IMPLANT
FORCEPS BIOP RJ4 1.8 (CUTTING FORCEPS) ×3 IMPLANT
GAUZE SPONGE 4X4 12PLY STRL (GAUZE/BANDAGES/DRESSINGS) ×3 IMPLANT
GLOVE BIO SURGEON STRL SZ8 (GLOVE) ×3 IMPLANT
GOWN STRL REUS W/ TWL LRG LVL3 (GOWN DISPOSABLE) ×1 IMPLANT
GOWN STRL REUS W/TWL LRG LVL3 (GOWN DISPOSABLE) ×2
KIT CLEAN ENDO COMPLIANCE (KITS) ×6 IMPLANT
KIT ROOM TURNOVER OR (KITS) ×3 IMPLANT
MARKER SKIN DUAL TIP RULER LAB (MISCELLANEOUS) ×3 IMPLANT
NEEDLE EBUS SONO TIP PENTAX (NEEDLE) ×6 IMPLANT
NS IRRIG 1000ML POUR BTL (IV SOLUTION) ×3 IMPLANT
OIL SILICONE PENTAX (PARTS (SERVICE/REPAIRS)) ×3 IMPLANT
PAD ARMBOARD 7.5X6 YLW CONV (MISCELLANEOUS) ×6 IMPLANT
SYR 20CC LL (SYRINGE) ×3 IMPLANT
SYR 20ML ECCENTRIC (SYRINGE) ×9 IMPLANT
SYR 5ML LUER SLIP (SYRINGE) ×3 IMPLANT
TOWEL OR 17X24 6PK STRL BLUE (TOWEL DISPOSABLE) ×3 IMPLANT
TRAP SPECIMEN MUCOUS 40CC (MISCELLANEOUS) IMPLANT
TUBE CONNECTING 20'X1/4 (TUBING) ×1
TUBE CONNECTING 20X1/4 (TUBING) ×2 IMPLANT

## 2017-03-23 NOTE — Anesthesia Preprocedure Evaluation (Signed)
Anesthesia Evaluation  Patient identified by MRN, date of birth, ID band Patient awake    Reviewed: Allergy & Precautions, NPO status , Patient's Chart, lab work & pertinent test results  Airway Mallampati: I  TM Distance: >3 FB Neck ROM: Full    Dental   Pulmonary former smoker,    Pulmonary exam normal        Cardiovascular hypertension, Pt. on medications Normal cardiovascular exam     Neuro/Psych Depression    GI/Hepatic   Endo/Other    Renal/GU      Musculoskeletal   Abdominal   Peds  Hematology   Anesthesia Other Findings   Reproductive/Obstetrics                             Anesthesia Physical Anesthesia Plan  ASA: II  Anesthesia Plan: General   Post-op Pain Management:    Induction: Intravenous  PONV Risk Score and Plan: 3 and Ondansetron and Treatment may vary due to age or medical condition  Airway Management Planned: Oral ETT  Additional Equipment:   Intra-op Plan:   Post-operative Plan: Extubation in OR  Informed Consent: I have reviewed the patients History and Physical, chart, labs and discussed the procedure including the risks, benefits and alternatives for the proposed anesthesia with the patient or authorized representative who has indicated his/her understanding and acceptance.     Plan Discussed with: CRNA and Surgeon  Anesthesia Plan Comments:         Anesthesia Quick Evaluation

## 2017-03-23 NOTE — Anesthesia Procedure Notes (Signed)
Procedure Name: Intubation Date/Time: 03/23/2017 1:10 PM Performed by: Bryson Corona, CRNA Pre-anesthesia Checklist: Patient identified, Emergency Drugs available, Suction available and Patient being monitored Patient Re-evaluated:Patient Re-evaluated prior to induction Oxygen Delivery Method: Circle System Utilized Preoxygenation: Pre-oxygenation with 100% oxygen Induction Type: IV induction Ventilation: Mask ventilation without difficulty Laryngoscope Size: Mac and 3 Grade View: Grade II Tube type: Oral Tube size: 8.5 mm Number of attempts: 1 Airway Equipment and Method: Stylet and Oral airway Placement Confirmation: ETT inserted through vocal cords under direct vision,  positive ETCO2 and breath sounds checked- equal and bilateral Secured at: 22 cm Tube secured with: Tape Dental Injury: Teeth and Oropharynx as per pre-operative assessment

## 2017-03-23 NOTE — H&P (Signed)
[facility] H&P  HPI: Ms. Pilger has a large mediastinal mass and is here for an EBUS.  She had SVC syndrome and brief hypotension with her last EBUS, but after XRT treatment her facial swelling and SVT syndrome symptoms have improved.  She is here for repeat bronchoscopy.  Past Medical History:  Diagnosis Date  . Brain tumor (benign) ([place])   . Depression   . Hypertension      History reviewed. No pertinent family history.   Social History   Socioeconomic History  . Marital status: Married    Spouse name: Not on file  . Number of children: Not on file  . Years of education: Not on file  . Highest education level: Not on file  Social Needs  . Financial resource strain: Not on file  . Food insecurity - worry: Not on file  . Food insecurity - inability: Not on file  . Transportation needs - medical: Not on file  . Transportation needs - non-medical: Not on file  Occupational History  . Not on file  Tobacco Use  . Smoking status: Former Smoker    Packs/day: 1.00    Years: 59.00    Pack years: 59.00    Types: Cigarettes    Last attempt to quit: 06/11/2012    Years since quitting: 4.7  . Smokeless tobacco: Never Used  Substance and Sexual Activity  . Alcohol use: Yes    Comment: weekly  . Drug use: No  . Sexual activity: Not on file  Other Topics Concern  . Not on file  Social History Narrative  . Not on file     Allergies  Allergen Reactions  . Latex Dermatitis  . Gabapentin Other (See Comments)    Excessive sleep   . Tape Rash     @encmedstart @ Vitals:   03/23/17 1024  BP: 126/73  Pulse: (!) 117  Resp: 20  Temp: 98.7 F (37.1 C)  TempSrc: Oral  SpO2: 97%  Weight: 86.2 kg (190 lb)  Height: 5\' 9"  (1.753 m)   General:  Resting comfortably in bed HENT: NCAT OP clear PULM: CTA B, normal effort CV: RRR, no mgr GI: BS+, soft, nontender MSK: normal bulk and tone Neuro: awake, alert, no distress, MAEW  CT images reviewed: large mediastinal  mass  Impression/plan: Large mediastinal mass: plan bronchoscopy today under general anesthesia. We've discussed risks based on her last epidsode.  She is now feeling better and her SVC syndrome has improved.  She was walking around our office yesterday without difficulty.  Plan to proceed with EBUS today  Roselie Awkward, MD [facility] Pager: [phone] Cell: [phone] After 3pm or if no response, call [phone]

## 2017-03-23 NOTE — Op Note (Signed)
Video Bronchoscopy with Endobronchial Ultrasound Procedure Note  Date of Operation: 03/23/2017  Pre-op Diagnosis: 03/23/2017  Post-op Diagnosis: Non-small cell lung cancer  Surgeon: Roselie Awkward MD  Assistants: none  Anesthesia: General endotracheal anesthesia  Operation: Flexible video fiberoptic bronchoscopy with endobronchial ultrasound and biopsies.  Estimated Blood Loss: about 50 cc    Complications: none immediate  Indications and History: Cassidy Sanders is a 77 y.o. female with a known mediastinal mass who underwent a biopsy earlier that showed non-small cell lung cancer.  However samples from that procedure were limited because of her intolerance to anesthesia.  As there was not enough tissue to guide specific therapy we were asked to perform a bronchoscopy again after the patient had been treated with radiation therapy.  The risks, benefits, complications, treatment options and expected outcomes were discussed with the patient.  The possibilities of pneumothorax, pneumonia, reaction to medication, pulmonary aspiration, perforation of a viscus, bleeding, failure to diagnose a condition and creating a complication requiring transfusion or operation were discussed with the patient who freely signed the consent.    Description of Procedure: The patient was examined in the preoperative area and history and data from the preprocedure consultation were reviewed. It was deemed appropriate to proceed.  The patient was taken to [facility] [place], identified as [name] and the procedure verified as Flexible Video Fiberoptic Bronchoscopy.  A Time Out was held and the above information confirmed. After being taken to the operating room general anesthesia was initiated and the patient  was orally intubated. The video fiberoptic bronchoscope was introduced via the endotracheal tube and a general inspection was performed which showed that the endobronchial tumor had decreased in size but  was very vascular  The tumor extended from the distal trachea into the right mainstem bronchus.  The right upper lobe was nearly completely occluded from the tumor and the scope could not be passed into it.  The bronchus intermedius was narrowed by mass, but the scope could be passed into the RML and RLL which were patent. The standard scope was then withdrawn and the endobronchial ultrasound was used to identify and characterize the peritracheal, hilar and bronchial lymph nodes. Inspection showed one large mediastinal mass extending from the pretracheal area anterior to the right mainstem bronchus proximally up the right side of the trachea. Using real-time ultrasound guidance Wang needle biopsies were take from Station 4R nodes and were sent for cytology.  6 passes were taken in this area.  There was significant bleeding from the right upper lobe orifice after completion of the procedure.  This was treated with suctioning and epinephrine x2.  At the completion of the procedure there was no further bleeding.  However given the significance of this bleeding presumably from scope trauma from the ultrasound portion of the EBUS scope, I elected to perform no further biopsies to limit her risk of further bleeding.  Total EBL was about 50 cc. Otherwise the patient tolerated the procedure well without apparent complications. The bronchoscope was withdrawn. Anesthesia was reversed and the patient was taken to the PACU for recovery.   Samples: 1. Wang needle biopsies from 4R node  Plans:  The patient will be discharged from the PACU to home when recovered from anesthesia. We will review the cytology, pathology and microbiology results with the patient when they become available. Outpatient followup will be with Dr. Earlie Server.    Roselie Awkward, MD [facility] Pager: [phone] Cell: [phone] After 3pm or if no response, call  [phone] 03/23/2017    

## 2017-03-23 NOTE — Anesthesia Postprocedure Evaluation (Signed)
Anesthesia Post Note  Patient: Cassidy Sanders  Procedure(s) Performed: VIDEO BRONCHOSCOPY WITH ENDOBRONCHIAL ULTRASOUND (N/A )     Patient location during evaluation: PACU Anesthesia Type: General Level of consciousness: awake and alert Pain management: pain level controlled Vital Signs Assessment: post-procedure vital signs reviewed and stable Respiratory status: spontaneous breathing, nonlabored ventilation, respiratory function stable and patient connected to nasal cannula oxygen Cardiovascular status: blood pressure returned to baseline and stable Postop Assessment: no apparent nausea or vomiting Anesthetic complications: no    Last Vitals:  Vitals:   03/23/17 1430 03/23/17 1433  BP: (!) 151/121   Pulse: (!) 101   Resp: 14   Temp:  37 C  SpO2: 94%     Last Pain:  Vitals:   03/23/17 1433  TempSrc:   PainSc: 0-No pain                 Aeriana Speece DAVID

## 2017-03-23 NOTE — Transfer of Care (Signed)
Immediate Anesthesia Transfer of Care Note  Patient: Cassidy Sanders  Procedure(s) Performed: VIDEO BRONCHOSCOPY WITH ENDOBRONCHIAL ULTRASOUND (N/A )  Patient Location: PACU  Anesthesia Type:General  Level of Consciousness: awake, alert  and oriented  Airway & Oxygen Therapy: Patient Spontanous Breathing and Patient connected to face mask oxygen  Post-op Assessment: Report given to RN and Post -op Vital signs reviewed and stable  Post vital signs: Reviewed and stable  Last Vitals:  Vitals:   03/23/17 1024 03/23/17 1433  BP: 126/73   Pulse: (!) 117   Resp: 20   Temp: 37.1 C (P) 37 C  SpO2: 97%     Last Pain:  Vitals:   03/23/17 1433  TempSrc:   PainSc: (P) 0-No pain      Patients Stated Pain Goal: 4 (59/27/63 9432)  Complications: No apparent anesthesia complications

## 2017-03-24 ENCOUNTER — Encounter (HOSPITAL_COMMUNITY): Payer: Self-pay | Admitting: Pulmonary Disease

## 2017-03-26 ENCOUNTER — Ambulatory Visit
Admission: RE | Admit: 2017-03-26 | Discharge: 2017-03-26 | Disposition: A | Discharge status: Home / Self Care | Payer: Medicare Other | Source: Ambulatory Visit | Attending: Radiation Oncology | Admitting: Radiation Oncology

## 2017-03-26 DIAGNOSIS — Z510 Encounter for antineoplastic radiation therapy: Secondary | ICD-10-CM | POA: Diagnosis not present

## 2017-03-26 DIAGNOSIS — C382 Malignant neoplasm of posterior mediastinum: Secondary | ICD-10-CM | POA: Diagnosis not present

## 2017-03-26 NOTE — Progress Notes (Signed)
Reviewed, agree 

## 2017-03-27 ENCOUNTER — Ambulatory Visit
Admission: RE | Admit: 2017-03-27 | Discharge: 2017-03-27 | Disposition: A | Discharge status: Home / Self Care | Payer: Medicare Other | Source: Ambulatory Visit | Attending: Radiation Oncology | Admitting: Radiation Oncology

## 2017-03-27 DIAGNOSIS — C382 Malignant neoplasm of posterior mediastinum: Secondary | ICD-10-CM | POA: Diagnosis not present

## 2017-03-27 DIAGNOSIS — Z510 Encounter for antineoplastic radiation therapy: Secondary | ICD-10-CM | POA: Diagnosis not present

## 2017-03-28 ENCOUNTER — Encounter: Payer: Self-pay | Admitting: *Deleted

## 2017-03-28 ENCOUNTER — Ambulatory Visit
Admission: RE | Admit: 2017-03-28 | Discharge: 2017-03-28 | Disposition: A | Discharge status: Home / Self Care | Payer: Medicare Other | Source: Ambulatory Visit | Attending: Radiation Oncology | Admitting: Radiation Oncology

## 2017-03-28 DIAGNOSIS — C382 Malignant neoplasm of posterior mediastinum: Secondary | ICD-10-CM | POA: Diagnosis not present

## 2017-03-28 DIAGNOSIS — Z510 Encounter for antineoplastic radiation therapy: Secondary | ICD-10-CM | POA: Diagnosis not present

## 2017-03-28 MED ORDER — SONAFINE EX EMUL
1.0000 "application " | Freq: Two times a day (BID) | CUTANEOUS | Status: DC
  Administered 2017-03-28: 1 via TOPICAL

## 2017-03-28 NOTE — Progress Notes (Signed)
Oncology Nurse Navigator Documentation  Oncology Nurse Navigator Flowsheets 03/28/2017  Navigator Location [facility]  Navigator Encounter Type Other/per Dr. Julien Nordmann, I called pathology to check on recent biopsy results. Pathology is still pending.  I updated Dr. Julien Nordmann.   Confirmed Diagnosis Date 03/23/2017  Treatment Phase Pre-Tx/Tx Discussion  Barriers/Navigation Needs Coordination of Care  Interventions Coordination of Care  Coordination of Care Other  Acuity Level 1  Time Spent with Patient 30

## 2017-03-28 NOTE — Progress Notes (Signed)
Oncology Nurse Navigator Documentation  Oncology Nurse Navigator Flowsheets 03/28/2017  Navigator Location [facility]  Navigator Encounter Type Other/Dr. Julien Nordmann updated on pathology report. No new orders received.   Treatment Phase Pre-Tx/Tx Discussion  Barriers/Navigation Needs Coordination of Care  Interventions Coordination of Care  Coordination of Care Other  Acuity Level 1  Time Spent with Patient 15

## 2017-03-28 NOTE — Progress Notes (Signed)
Pt here for patient teaching.  Pt given Radiation and You booklet, skin care instructions and Sonafine.  Reviewed areas of pertinence such as fatigue, hair loss, skin changes and throat changes . Pt able to give teach back of ,apply Sonafine bid and avoid applying anything to skin within 4 hours of treatment. Pt verbalizes understanding of information given and will contact nursing with any questions or concerns.     [email or web address]

## 2017-03-29 ENCOUNTER — Ambulatory Visit
Admission: RE | Admit: 2017-03-29 | Discharge: 2017-03-29 | Disposition: A | Discharge status: Home / Self Care | Payer: Medicare Other | Source: Ambulatory Visit | Attending: Radiation Oncology | Admitting: Radiation Oncology

## 2017-03-29 DIAGNOSIS — C382 Malignant neoplasm of posterior mediastinum: Secondary | ICD-10-CM | POA: Diagnosis not present

## 2017-03-29 DIAGNOSIS — Z510 Encounter for antineoplastic radiation therapy: Secondary | ICD-10-CM | POA: Diagnosis not present

## 2017-03-30 ENCOUNTER — Ambulatory Visit
Admission: RE | Admit: 2017-03-30 | Discharge: 2017-03-30 | Disposition: A | Discharge status: Home / Self Care | Payer: Medicare Other | Source: Ambulatory Visit | Attending: Radiation Oncology | Admitting: Radiation Oncology

## 2017-03-30 DIAGNOSIS — C382 Malignant neoplasm of posterior mediastinum: Secondary | ICD-10-CM | POA: Diagnosis not present

## 2017-03-30 DIAGNOSIS — Z510 Encounter for antineoplastic radiation therapy: Secondary | ICD-10-CM | POA: Diagnosis not present

## 2017-04-01 NOTE — Progress Notes (Signed)
  Radiation Oncology         [phone] ________________________________  Name: Cassidy Sanders MRN: [id number]  Date: 03/16/2017  DOB: 1940-06-17  SIMULATION AND TREATMENT PLANNING NOTE  DIAGNOSIS:     ICD-10-CM   1. 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  Identity was confirmed.  All relevant records and images related to the planned course of therapy were reviewed.   Written consent to proceed with treatment was confirmed which was freely given after reviewing the details related to the planned course of therapy had been reviewed with the patient.  Then, the patient was set-up in a stable reproducible  supine position for radiation therapy.  CT images were obtained.  Surface markings were placed.    Medically necessary complex treatment device(s) for immobilization:  Vac-lock bag.   The CT images were loaded into the planning software.  Then the target and avoidance structures were contoured.  Treatment planning then occurred.  The radiation prescription was entered and confirmed.  A total of 4 complex treatment devices were fabricated which relate to the designed radiation treatment fields. Each of these customized fields/ complex treatment devices will be used on a daily basis during the radiation course. I have requested : 3D Simulation  I have requested a DVH of the following structures: target volume, lungs, spinal cord.   The patient will undergo daily image guidance to ensure accurate localization of the target, and adequate minimize dose to the normal surrounding structures in close proximity to the target.   PLAN:  The patient will receive 37.5 Gy in 15 fractions.  ________________________________   Jodelle Gross, MD, PhD

## 2017-04-02 ENCOUNTER — Ambulatory Visit
Admission: RE | Admit: 2017-04-02 | Discharge: 2017-04-02 | Disposition: A | Discharge status: Home / Self Care | Payer: Medicare Other | Source: Ambulatory Visit | Attending: Radiation Oncology | Admitting: Radiation Oncology

## 2017-04-02 DIAGNOSIS — Z510 Encounter for antineoplastic radiation therapy: Secondary | ICD-10-CM | POA: Diagnosis not present

## 2017-04-02 DIAGNOSIS — C382 Malignant neoplasm of posterior mediastinum: Secondary | ICD-10-CM | POA: Diagnosis not present

## 2017-04-03 ENCOUNTER — Ambulatory Visit
Admission: RE | Admit: 2017-04-03 | Discharge: 2017-04-03 | Disposition: A | Discharge status: Home / Self Care | Payer: Medicare Other | Source: Ambulatory Visit | Attending: Radiation Oncology | Admitting: Radiation Oncology

## 2017-04-03 DIAGNOSIS — Z510 Encounter for antineoplastic radiation therapy: Secondary | ICD-10-CM | POA: Diagnosis not present

## 2017-04-03 DIAGNOSIS — C382 Malignant neoplasm of posterior mediastinum: Secondary | ICD-10-CM | POA: Diagnosis not present

## 2017-04-04 ENCOUNTER — Ambulatory Visit
Admission: RE | Admit: 2017-04-04 | Discharge: 2017-04-04 | Disposition: A | Discharge status: Home / Self Care | Payer: Medicare Other | Source: Ambulatory Visit | Attending: Radiation Oncology | Admitting: Radiation Oncology

## 2017-04-04 ENCOUNTER — Inpatient Hospital Stay: Admission: RE | Admit: 2017-04-04 | Payer: Self-pay | Source: Ambulatory Visit | Admitting: Radiation Oncology

## 2017-04-04 DIAGNOSIS — C382 Malignant neoplasm of posterior mediastinum: Secondary | ICD-10-CM | POA: Diagnosis not present

## 2017-04-04 DIAGNOSIS — Z510 Encounter for antineoplastic radiation therapy: Secondary | ICD-10-CM | POA: Diagnosis not present

## 2017-04-05 ENCOUNTER — Ambulatory Visit
Admission: RE | Admit: 2017-04-05 | Discharge: 2017-04-05 | Disposition: A | Discharge status: Home / Self Care | Payer: Medicare Other | Source: Ambulatory Visit | Attending: Radiation Oncology | Admitting: Radiation Oncology

## 2017-04-05 DIAGNOSIS — Z510 Encounter for antineoplastic radiation therapy: Secondary | ICD-10-CM | POA: Diagnosis not present

## 2017-04-05 DIAGNOSIS — C382 Malignant neoplasm of posterior mediastinum: Secondary | ICD-10-CM | POA: Diagnosis not present

## 2017-04-06 ENCOUNTER — Encounter: Payer: Self-pay | Admitting: *Deleted

## 2017-04-06 ENCOUNTER — Ambulatory Visit
Admission: RE | Admit: 2017-04-06 | Discharge: 2017-04-06 | Disposition: A | Discharge status: Home / Self Care | Payer: Medicare Other | Source: Ambulatory Visit | Attending: Radiation Oncology | Admitting: Radiation Oncology

## 2017-04-06 DIAGNOSIS — C382 Malignant neoplasm of posterior mediastinum: Secondary | ICD-10-CM | POA: Diagnosis not present

## 2017-04-06 DIAGNOSIS — Z510 Encounter for antineoplastic radiation therapy: Secondary | ICD-10-CM | POA: Diagnosis not present

## 2017-04-06 NOTE — Progress Notes (Signed)
Oncology Nurse Navigator Documentation  Oncology Nurse Navigator Flowsheets 04/06/2017  Navigator Location [facility]  Navigator Encounter Type Other/I followed up with pathology regarding molecular and PDL 1 test results. Waiting for an update.   Treatment Phase Treatment  Barriers/Navigation Needs Coordination of Care  Interventions Coordination of Care  Coordination of Care Other  Acuity Level 2  Time Spent with Patient 30

## 2017-04-06 NOTE — Progress Notes (Signed)
Oncology Nurse Navigator Documentation  Oncology Nurse Navigator Flowsheets 04/06/2017  Navigator Location [facility]  Navigator Encounter Type Other/pathology updated me there is not enough tissue for molecular and PDL 1 testing. I will update Dr. Julien Nordmann  Treatment Phase Treatment  Barriers/Navigation Needs Coordination of Care  Interventions Coordination of Care  Coordination of Care Other  Acuity Level 2  Time Spent with Patient 15

## 2017-04-09 ENCOUNTER — Ambulatory Visit
Admission: RE | Admit: 2017-04-09 | Discharge: 2017-04-09 | Disposition: A | Discharge status: Home / Self Care | Payer: Medicare Other | Source: Ambulatory Visit | Attending: Radiation Oncology | Admitting: Radiation Oncology

## 2017-04-09 DIAGNOSIS — C382 Malignant neoplasm of posterior mediastinum: Secondary | ICD-10-CM | POA: Diagnosis not present

## 2017-04-09 DIAGNOSIS — Z510 Encounter for antineoplastic radiation therapy: Secondary | ICD-10-CM | POA: Diagnosis not present

## 2017-04-10 ENCOUNTER — Ambulatory Visit
Admission: RE | Admit: 2017-04-10 | Discharge: 2017-04-10 | Disposition: A | Discharge status: Home / Self Care | Payer: Medicare Other | Source: Ambulatory Visit | Attending: Radiation Oncology | Admitting: Radiation Oncology

## 2017-04-10 ENCOUNTER — Ambulatory Visit: Payer: Medicare Other

## 2017-04-10 DIAGNOSIS — Z510 Encounter for antineoplastic radiation therapy: Secondary | ICD-10-CM | POA: Diagnosis not present

## 2017-04-10 DIAGNOSIS — C382 Malignant neoplasm of posterior mediastinum: Secondary | ICD-10-CM | POA: Diagnosis not present

## 2017-04-11 ENCOUNTER — Telehealth: Payer: Self-pay | Admitting: Radiation Oncology

## 2017-04-11 ENCOUNTER — Encounter: Payer: Self-pay | Admitting: Radiation Oncology

## 2017-04-11 ENCOUNTER — Telehealth: Payer: Self-pay | Admitting: Adult Health

## 2017-04-11 ENCOUNTER — Ambulatory Visit
Admission: RE | Admit: 2017-04-11 | Discharge: 2017-04-11 | Disposition: A | Discharge status: Home / Self Care | Payer: Medicare Other | Source: Ambulatory Visit | Attending: Radiation Oncology | Admitting: Radiation Oncology

## 2017-04-11 DIAGNOSIS — C382 Malignant neoplasm of posterior mediastinum: Secondary | ICD-10-CM | POA: Diagnosis not present

## 2017-04-11 DIAGNOSIS — Z510 Encounter for antineoplastic radiation therapy: Secondary | ICD-10-CM | POA: Diagnosis not present

## 2017-04-11 NOTE — Telephone Encounter (Signed)
LM for pt to call me back. She has a new right effusion seen on treatment imaging that was not there on her PET in December.

## 2017-04-11 NOTE — Telephone Encounter (Signed)
Received word from [facility] yesterday that patient is wondering why she has not received her SimplyGo Mini Per Judeen Hammans pt is on the list to receive her SimplyGo Mini next month in February  Did discuss with patient at the office visit that we are allotted 5 SimplyGo Minis per calendar month for our patients - I believe that she was even told that we had already used our 5 by Jan 4th.  Pt was also advised at time of visit that she would be placed on the February waiting list and would receive her POC during that month.  LMOM TCB x1 to apologize to patient for any miscommunication and to discuss her SimplyGo Mini

## 2017-04-11 NOTE — Telephone Encounter (Signed)
I spoke with the patient to let her know that Dr. Lisbeth Renshaw noted a new right effusion on her treatment based imaging. She is asymptomatic from this, but we discussed the role of ultrasound and possible thoracentesis. She is in agreement to proceed. Orders will be placed, and we will follow up with these results when available. She will see Dr. Julien Nordmann as well to discuss systemic therapy next week.

## 2017-04-12 NOTE — Telephone Encounter (Signed)
Pt called back and she is aware that the DME will contact her about the POC for the month of Feb.  Will forward back to JJ to see if anything further needs to be done.

## 2017-04-12 NOTE — Telephone Encounter (Signed)
Nothing further needed Thank you Will sign off

## 2017-04-16 ENCOUNTER — Telehealth: Payer: Self-pay | Admitting: *Deleted

## 2017-04-16 NOTE — Telephone Encounter (Signed)
Oncology Nurse Navigator Documentation  Oncology Nurse Navigator Flowsheets 04/16/2017  Navigator Location [facility]  Navigator Encounter Type Telephone/I followed up on [name]'s thoracentesis. This has not been completed. I called patient's daughter to get an update.  I spoke with Cassidy Sanders. She states the patient is doing well without complaints.  Patient is not SOB and was felt thoracentesis was not needed at this time. I was glad to hear she is doing well. Patient will be seen with Dr. Julien Nordmann on 04/17/17.  The daughter verbalized understanding of appt and was thankful for the follow up.   Telephone Outgoing Call  Treatment Phase Other  Barriers/Navigation Needs Education  Education Other  Interventions Education  Acuity Level 2  Time Spent with Patient 30

## 2017-04-17 ENCOUNTER — Other Ambulatory Visit: Payer: Self-pay | Admitting: Medical Oncology

## 2017-04-17 ENCOUNTER — Encounter: Payer: Self-pay | Admitting: Medical Oncology

## 2017-04-17 ENCOUNTER — Inpatient Hospital Stay (HOSPITAL_BASED_OUTPATIENT_CLINIC_OR_DEPARTMENT_OTHER): Payer: Medicare Other | Admitting: Internal Medicine

## 2017-04-17 ENCOUNTER — Inpatient Hospital Stay: Payer: Medicare Other

## 2017-04-17 ENCOUNTER — Telehealth: Payer: Self-pay | Admitting: *Deleted

## 2017-04-17 ENCOUNTER — Inpatient Hospital Stay: Payer: Medicare Other | Attending: Internal Medicine

## 2017-04-17 ENCOUNTER — Ambulatory Visit: Payer: Medicare Other | Admitting: Internal Medicine

## 2017-04-17 ENCOUNTER — Telehealth: Payer: Self-pay | Admitting: Medical Oncology

## 2017-04-17 ENCOUNTER — Encounter: Payer: Self-pay | Admitting: Internal Medicine

## 2017-04-17 ENCOUNTER — Other Ambulatory Visit: Payer: Medicare Other

## 2017-04-17 VITALS — BP 131/96 | HR 91 | Temp 97.7°F | Resp 16 | Ht 69.0 in | Wt 192.1 lb

## 2017-04-17 DIAGNOSIS — C382 Malignant neoplasm of posterior mediastinum: Secondary | ICD-10-CM

## 2017-04-17 DIAGNOSIS — R599 Enlarged lymph nodes, unspecified: Secondary | ICD-10-CM

## 2017-04-17 DIAGNOSIS — R062 Wheezing: Secondary | ICD-10-CM | POA: Insufficient documentation

## 2017-04-17 DIAGNOSIS — Z79899 Other long term (current) drug therapy: Secondary | ICD-10-CM | POA: Insufficient documentation

## 2017-04-17 DIAGNOSIS — R0902 Hypoxemia: Secondary | ICD-10-CM | POA: Insufficient documentation

## 2017-04-17 DIAGNOSIS — R0602 Shortness of breath: Secondary | ICD-10-CM | POA: Diagnosis not present

## 2017-04-17 DIAGNOSIS — C3491 Malignant neoplasm of unspecified part of right bronchus or lung: Secondary | ICD-10-CM | POA: Diagnosis not present

## 2017-04-17 DIAGNOSIS — R5382 Chronic fatigue, unspecified: Secondary | ICD-10-CM

## 2017-04-17 DIAGNOSIS — Z5111 Encounter for antineoplastic chemotherapy: Secondary | ICD-10-CM | POA: Insufficient documentation

## 2017-04-17 LAB — CBC WITH DIFFERENTIAL/PLATELET
BASOS ABS: 0 10*3/uL (ref 0.0–0.1)
BASOS PCT: 1 %
EOS ABS: 0.1 10*3/uL (ref 0.0–0.5)
Eosinophils Relative: 1 %
HCT: 36.9 % (ref 34.8–46.6)
HEMOGLOBIN: 12.2 g/dL (ref 11.6–15.9)
Lymphocytes Relative: 9 %
Lymphs Abs: 0.5 10*3/uL — ABNORMAL LOW (ref 0.9–3.3)
MCH: 29.1 pg (ref 25.1–34.0)
MCHC: 33 g/dL (ref 31.5–36.0)
MCV: 88.2 fL (ref 79.5–101.0)
MONOS PCT: 10 %
Monocytes Absolute: 0.6 10*3/uL (ref 0.1–0.9)
NEUTROS ABS: 4.5 10*3/uL (ref 1.5–6.5)
NEUTROS PCT: 79 %
Platelets: 178 10*3/uL (ref 145–400)
RBC: 4.18 MIL/uL (ref 3.70–5.45)
RDW: 16.1 % — AB (ref 11.2–14.5)
WBC: 5.7 10*3/uL (ref 3.9–10.3)

## 2017-04-17 LAB — TSH: TSH: 1.766 u[IU]/mL (ref 0.308–3.960)

## 2017-04-17 LAB — COMPREHENSIVE METABOLIC PANEL
ALK PHOS: 88 U/L (ref 40–150)
ALT: 10 U/L (ref 0–55)
ANION GAP: 8 (ref 3–11)
AST: 16 U/L (ref 5–34)
Albumin: 3.4 g/dL — ABNORMAL LOW (ref 3.5–5.0)
BILIRUBIN TOTAL: 0.7 mg/dL (ref 0.2–1.2)
BUN: 13 mg/dL (ref 7–26)
CALCIUM: 9.5 mg/dL (ref 8.4–10.4)
CO2: 29 mmol/L (ref 22–29)
CREATININE: 0.74 mg/dL (ref 0.60–1.10)
Chloride: 104 mmol/L (ref 98–109)
Glucose, Bld: 94 mg/dL (ref 70–140)
Potassium: 3.7 mmol/L (ref 3.5–5.1)
SODIUM: 141 mmol/L (ref 136–145)
TOTAL PROTEIN: 7 g/dL (ref 6.4–8.3)

## 2017-04-17 LAB — RESEARCH LABS

## 2017-04-17 MED ORDER — PROCHLORPERAZINE MALEATE 10 MG PO TABS
10.0000 mg | ORAL_TABLET | Freq: Four times a day (QID) | ORAL | 0 refills | Status: DC | PRN
Start: 2017-04-17 — End: 2017-07-06

## 2017-04-17 NOTE — Progress Notes (Signed)
[facility] Telephone:([phone]   [phone]  OFFICE PROGRESS NOTE  Patient, No Pcp Per No address on file  DIAGNOSIS: Metastatic non-small cell carcinoma (T3, N2, M1a) non-small cell lung cancer diagnosed in November 2018 and presented with large right suprahilar and mediastinal mass as well as recent right pleural effusion.  PRIOR THERAPY: Palliative radiotherapy to the large mediastinal mass under the care of Dr. Lisbeth Renshaw.  CURRENT THERAPY: Systemic chemotherapy with carboplatin for AC of 5, paclitaxel 175 mg/M2 and Keytruda 200 mg IV every 3 weeks.  First dose expected May 01, 2017.  INTERVAL HISTORY: Cassidy Sanders 77 y.o. female returns to the clinic today for follow-up visit accompanied by her daughter.  The patient is feeling a little bit better today except for the baseline shortness of breath.  She also has mild swallowing issues after the palliative radiotherapy.  She had repeat bronchoscopy and biopsy by Dr. Lake Bells and the final pathology was consistent with non-small cell carcinoma.  There was insufficient material for additional studies and melanoma was not excluded.  The patient also develop right pleural effusion but she was not interested in proceeding with ultrasound-guided thoracentesis.  She denied having any current weight loss or night sweats.  She has no nausea, vomiting, diarrhea or constipation.  She has no headache or visual changes.  She is here today for evaluation and discussion of her treatment options.   MEDICAL HISTORY: Past Medical History:  Diagnosis Date  . Brain tumor (benign) ([place])   . Depression   . Hypertension     ALLERGIES:  is allergic to latex; gabapentin; and tape.  MEDICATIONS:  Current Outpatient Medications  Medication Sig Dispense Refill  . Biotin 2.5 MG TABS Take 2,500 mcg by mouth daily.    . chlorpheniramine-HYDROcodone (TUSSIONEX) 10-8 MG/5ML SUER Take 5 mLs by mouth every 12 (twelve) hours as  needed for cough. 140 mL 0  . metoprolol tartrate (LOPRESSOR) 50 MG tablet Take 0.5 tablets (25 mg total) by mouth 2 (two) times daily. 30 tablet 11  . Multiple Vitamins-Minerals (ADULT GUMMY) CHEW Chew 1 tablet by mouth daily.    . sertraline (ZOLOFT) 50 MG tablet Take 50 mg by mouth daily.    . vitamin E 400 UNIT capsule Take 400 Units by mouth daily.     No current facility-administered medications for this visit.     SURGICAL HISTORY:  Past Surgical History:  Procedure Laterality Date  . ABDOMINAL HYSTERECTOMY    . BLADDER SURGERY    . BRAIN SURGERY    . ENDOBRONCHIAL ULTRASOUND Bilateral 02/06/2017   Procedure: ENDOBRONCHIAL ULTRASOUND;  Surgeon: Juanito Doom, MD;  Location: [facility];  Service: Cardiopulmonary;  Laterality: Bilateral;  . VIDEO BRONCHOSCOPY WITH ENDOBRONCHIAL ULTRASOUND N/A 03/23/2017   Procedure: VIDEO BRONCHOSCOPY WITH ENDOBRONCHIAL ULTRASOUND;  Surgeon: Juanito Doom, MD;  Location: [facility];  Service: Thoracic;  Laterality: N/A;    REVIEW OF SYSTEMS:  Constitutional: positive for fatigue Eyes: negative Ears, nose, mouth, throat, and face: negative Respiratory: positive for dyspnea on exertion Cardiovascular: negative Gastrointestinal: negative Genitourinary:negative Integument/breast: negative Hematologic/lymphatic: negative Musculoskeletal:negative Neurological: negative Behavioral/Psych: negative Endocrine: negative Allergic/Immunologic: negative   PHYSICAL EXAMINATION: General appearance: alert, cooperative, fatigued and no distress Head: Normocephalic, without obvious abnormality, atraumatic Neck: no adenopathy, no JVD, supple, symmetrical, trachea midline and thyroid not enlarged, symmetric, no tenderness/mass/nodules Lymph nodes: Cervical, supraclavicular, and axillary nodes normal. Resp: wheezes bilaterally Back: symmetric, no curvature. ROM normal. No CVA tenderness. Cardio: regular  rate and rhythm, S1, S2 normal, no murmur, click,  rub or gallop GI: soft, non-tender; bowel sounds normal; no masses,  no organomegaly Extremities: extremities normal, atraumatic, no cyanosis or edema Neurologic: Alert and oriented X 3, normal strength and tone. Normal symmetric reflexes. Normal coordination and gait  ECOG PERFORMANCE STATUS: 1 - Symptomatic but completely ambulatory  Blood pressure (!) 131/96, pulse 91, temperature 97.7 F (36.5 C), temperature source Oral, resp. rate 16, height 5\' 9"  (1.753 m), weight 192 lb 1.6 oz (87.1 kg), SpO2 95 %.  LABORATORY DATA: Lab Results  Component Value Date   WBC 5.7 04/17/2017   HGB 12.2 04/17/2017   HCT 36.9 04/17/2017   MCV 88.2 04/17/2017   PLT 178 04/17/2017      Chemistry      Component Value Date/Time   NA 141 04/17/2017 1020   NA 143 03/15/2017 1436   K 3.7 04/17/2017 1020   K 3.7 03/15/2017 1436   CL 104 04/17/2017 1020   CO2 29 04/17/2017 1020   CO2 34 (H) 03/15/2017 1436   BUN 13 04/17/2017 1020   BUN 17.8 03/15/2017 1436   CREATININE 0.74 04/17/2017 1020   CREATININE 0.8 03/15/2017 1436      Component Value Date/Time   CALCIUM 9.5 04/17/2017 1020   CALCIUM 9.6 03/15/2017 1436   ALKPHOS 88 04/17/2017 1020   ALKPHOS 88 03/15/2017 1436   AST 16 04/17/2017 1020   AST 16 03/15/2017 1436   ALT 10 04/17/2017 1020   ALT 15 03/15/2017 1436   BILITOT 0.7 04/17/2017 1020   BILITOT 0.52 03/15/2017 1436       RADIOGRAPHIC STUDIES: No results found.  ASSESSMENT AND PLAN: This is a very pleasant 77 years old white female recently diagnosed with probably stage IV non-small cell lung cancer presented with large right suprahilar lung mass in addition to mediastinal lymphadenopathy and suspicious malignant right pleural effusion.  Unfortunately the patient had bronchoscopy twice but there was insufficient material on the biopsy for further study to identify the subtype of her lung cancer.  The possibility of melanoma was not also excluded based on the most recent report  but this is unlikely with the presentation and imaging studies. The patient underwent palliative radiotherapy to the right lung mass and mediastinal lymphadenopathy under the care of Dr. Lisbeth Renshaw.  She tolerated this treatment fairly well.  She developed right pleural effusion in the interval but she is not interested in proceeding with ultrasound-guided thoracentesis. I had a lengthy discussion with the patient and her daughter today about her current disease status and treatment options.  I discussed with him several options for her condition including repeat bronchoscopy with endobronchial ultrasound and biopsies by thoracic surgery versus ultrasound-guided right thoracentesis and cytologic evaluation of the pleural effusion versus observation and close monitoring and repeat imaging studies in 2 months versus proceeding with palliative systemic chemotherapy with carboplatin for AC of 5, paclitaxel 175 mg/M2 and Keytruda 200 mg IV every 3 weeks. The patient is not interested in any of the previous option except for the systemic chemotherapy. I will arrange for the patient to start this treatment in around 2 weeks. In the meantime, we will send a blood sample to [facility] for molecular studies. The patient will come back for follow-up visit in 2 weeks with the start of the first cycle of the chemotherapy. She will have a chemotherapy education class before the first dose of her treatment. The patient was advised to call immediately if  she has any concerning symptoms in the interval. The patient voices understanding of current disease status and treatment options and is in agreement with the current care plan.  All questions were answered. The patient knows to call the clinic with any problems, questions or concerns. We can certainly see the patient much sooner if necessary.   Disclaimer: This note was dictated with voice recognition software. Similar sounding words can inadvertently be transcribed and  may not be corrected upon review.

## 2017-04-17 NOTE — Progress Notes (Signed)
START ON PATHWAY REGIMEN - Non-Small Cell Lung     A cycle is every 21 days:     Pembrolizumab      Paclitaxel      Carboplatin   **Always confirm dose/schedule in your pharmacy ordering system**    Patient Characteristics: Stage IV Metastatic, Squamous, PS = 0, 1, First Line, PD-L1 Expression Positive 1-49% (TPS) / Negative / Not Tested / Awaiting Test Results AJCC T Category: T3 Current Disease Status: Distant Metastases AJCC N Category: N2 AJCC M Category: M1a AJCC 8 Stage Grouping: IVA Histology: Squamous Cell Line of therapy: First Line PD-L1 Expression Status: Did Not Order Test Performance Status: PS = 0, 1 Would you be surprised if this patient died  in the next year<= I would NOT be surprised if this patient died in the next year Intent of Therapy: Non-Curative / Palliative Intent, Discussed with Patient

## 2017-04-17 NOTE — Telephone Encounter (Signed)
Oncology Nurse Navigator Documentation  Oncology Nurse Navigator Flowsheets 04/17/2017  Navigator Location [facility]  Navigator Encounter Type Telephone/I received a message from lab that they have not received paper work for blood test. I called patient's daughter and they have paper work. They will bring it back to the cancer center.  They daughter was upset regarding not getting more information about lab work.  I explained Dr. Julien Nordmann wanted to have a face to face conversation about results.  I explained this can be a difficult process and hard to understand that is why he had face to face conversation with them.    Telephone Outgoing Call  Treatment Phase Pre-Tx/Tx Discussion  Barriers/Navigation Needs Education  Education Other  Interventions Education  Acuity Level 2  Time Spent with Patient 30

## 2017-04-17 NOTE — Telephone Encounter (Signed)
Please call pt with lab results ( [facility])  and what is pts  cell type.

## 2017-04-18 DIAGNOSIS — I119 Hypertensive heart disease without heart failure: Secondary | ICD-10-CM | POA: Diagnosis not present

## 2017-04-18 DIAGNOSIS — C3491 Malignant neoplasm of unspecified part of right bronchus or lung: Secondary | ICD-10-CM | POA: Diagnosis not present

## 2017-04-23 ENCOUNTER — Encounter: Payer: Self-pay | Admitting: *Deleted

## 2017-04-23 NOTE — Progress Notes (Signed)
  Radiation Oncology         [phone]) [phone] ________________________________  Name: Cassidy Sanders MRN: [id number]  Date: 04/11/2017  DOB: May 19, 1940  End of Treatment Note  Diagnosis:   77 y.o. female with malignant neoplasm of posterior mediastinum     Indication for treatment::  palliative       Radiation treatment dates:   03/21/2017 - 04/11/2017  Site/dose:   The right lung/mediastinum was re-treated this time to 37.5 Gy in 15 fractions.  Narrative: The patient tolerated radiation treatment relatively well, with mild fatigue. She experienced some shortness of breath and a cough but otherwise denied any issues with swallowing and maintained a good appetite. No skin issues noted.  Plan: The patient has completed radiation treatment. The patient will return to radiation oncology clinic for routine followup in one month. I advised the patient to call or return sooner if they have any questions or concerns related to their recovery or treatment. ________________________________  Jodelle Gross, MD, PhD  This document serves as a record of services personally performed by Kyung Rudd, MD. It was created on his behalf by Rae Lips, a trained medical scribe. The creation of this record is based on the scribe's personal observations and the provider's statements to them. This document has been checked and approved by the attending provider.

## 2017-04-24 ENCOUNTER — Inpatient Hospital Stay: Payer: Medicare Other

## 2017-04-24 ENCOUNTER — Encounter: Payer: Self-pay | Admitting: *Deleted

## 2017-04-24 NOTE — Progress Notes (Signed)
Oncology Nurse Navigator Documentation  Oncology Nurse Navigator Flowsheets 04/24/2017  Navigator Location [facility]  Navigator Encounter Type Other/I received a call from [facility] that ICD code was incorrect. I called and updated them. I did also ask when results will be read out. I was told they will be completed on 04/25/17.  I will update Dr. Julien Nordmann.   Treatment Phase Pre-Tx/Tx Discussion  Barriers/Navigation Needs Coordination of Care  Interventions Coordination of Care  Coordination of Care Other  Acuity Level 2  Time Spent with Patient 30

## 2017-04-25 ENCOUNTER — Telehealth: Payer: Self-pay | Admitting: *Deleted

## 2017-04-25 NOTE — Telephone Encounter (Signed)
Oncology Nurse Navigator Documentation  Oncology Nurse Navigator Flowsheets 04/25/2017  Navigator Location [facility]  Navigator Encounter Type Telephone/per Dr. Julien Nordmann, he wanted me to update patient and family on [facility] results. I called patient's daughter and then patient with an update. They both verbalized understanding of update. Patient is set up for chemo on 05/01/17  Telephone Outgoing Call  Treatment Phase Pre-Tx/Tx Discussion  Barriers/Navigation Needs Education  Education Other  Interventions Education  Education Method Verbal  Acuity Level 2  Time Spent with Patient 30

## 2017-05-01 ENCOUNTER — Inpatient Hospital Stay: Payer: Medicare Other

## 2017-05-01 ENCOUNTER — Other Ambulatory Visit: Payer: Self-pay | Admitting: Medical

## 2017-05-01 ENCOUNTER — Ambulatory Visit (HOSPITAL_BASED_OUTPATIENT_CLINIC_OR_DEPARTMENT_OTHER): Payer: Medicare Other | Admitting: Medical

## 2017-05-01 VITALS — BP 140/70 | HR 88 | Temp 98.0°F | Resp 22 | Wt 190.2 lb

## 2017-05-01 DIAGNOSIS — Z79899 Other long term (current) drug therapy: Secondary | ICD-10-CM | POA: Diagnosis not present

## 2017-05-01 DIAGNOSIS — C382 Malignant neoplasm of posterior mediastinum: Secondary | ICD-10-CM

## 2017-05-01 DIAGNOSIS — C3491 Malignant neoplasm of unspecified part of right bronchus or lung: Secondary | ICD-10-CM | POA: Diagnosis not present

## 2017-05-01 DIAGNOSIS — C3490 Malignant neoplasm of unspecified part of unspecified bronchus or lung: Secondary | ICD-10-CM

## 2017-05-01 DIAGNOSIS — R062 Wheezing: Secondary | ICD-10-CM

## 2017-05-01 DIAGNOSIS — Z5111 Encounter for antineoplastic chemotherapy: Secondary | ICD-10-CM | POA: Diagnosis not present

## 2017-05-01 DIAGNOSIS — R0902 Hypoxemia: Secondary | ICD-10-CM

## 2017-05-01 LAB — CBC WITH DIFFERENTIAL (CANCER CENTER ONLY)
Basophils Absolute: 0 10*3/uL (ref 0.0–0.1)
Basophils Relative: 0 %
Eosinophils Absolute: 0.1 10*3/uL (ref 0.0–0.5)
Eosinophils Relative: 1 %
HEMATOCRIT: 37.2 % (ref 34.8–46.6)
HEMOGLOBIN: 11.8 g/dL (ref 11.6–15.9)
LYMPHS ABS: 0.5 10*3/uL — AB (ref 0.9–3.3)
LYMPHS PCT: 5 %
MCH: 28.9 pg (ref 25.1–34.0)
MCHC: 31.7 g/dL (ref 31.5–36.0)
MCV: 91.2 fL (ref 79.5–101.0)
Monocytes Absolute: 0.8 10*3/uL (ref 0.1–0.9)
Monocytes Relative: 10 %
NEUTROS PCT: 84 %
Neutro Abs: 7.1 10*3/uL — ABNORMAL HIGH (ref 1.5–6.5)
Platelet Count: 262 10*3/uL (ref 145–400)
RBC: 4.08 MIL/uL (ref 3.70–5.45)
RDW: 14.4 % (ref 11.2–14.5)
WBC: 8.5 10*3/uL (ref 3.9–10.3)

## 2017-05-01 LAB — CMP (CANCER CENTER ONLY)
ALT: 15 U/L (ref 0–55)
AST: 19 U/L (ref 5–34)
Albumin: 3 g/dL — ABNORMAL LOW (ref 3.5–5.0)
Alkaline Phosphatase: 98 U/L (ref 40–150)
Anion gap: 11 (ref 3–11)
BUN: 13 mg/dL (ref 7–26)
CHLORIDE: 101 mmol/L (ref 98–109)
CO2: 30 mmol/L — AB (ref 22–29)
CREATININE: 0.73 mg/dL (ref 0.60–1.10)
Calcium: 10 mg/dL (ref 8.4–10.4)
Glucose, Bld: 109 mg/dL (ref 70–140)
POTASSIUM: 3.5 mmol/L (ref 3.5–5.1)
SODIUM: 142 mmol/L (ref 136–145)
Total Bilirubin: 0.7 mg/dL (ref 0.2–1.2)
Total Protein: 7.3 g/dL (ref 6.4–8.3)

## 2017-05-01 MED ORDER — SODIUM CHLORIDE 0.9 % IV SOLN: Freq: Once | INTRAVENOUS | Status: DC

## 2017-05-01 MED ORDER — PEGFILGRASTIM 6 MG/0.6ML ~~LOC~~ PSKT
PREFILLED_SYRINGE | SUBCUTANEOUS | Status: AC
  Filled 2017-05-01: qty 0.6

## 2017-05-01 MED ORDER — ALBUTEROL SULFATE (2.5 MG/3ML) 0.083% IN NEBU
INHALATION_SOLUTION | RESPIRATORY_TRACT | Status: AC
  Filled 2017-05-01: qty 3

## 2017-05-01 MED ORDER — DIPHENHYDRAMINE HCL 50 MG/ML IJ SOLN
INTRAMUSCULAR | Status: AC
  Filled 2017-05-01: qty 1

## 2017-05-01 MED ORDER — SODIUM CHLORIDE 0.9 % IV SOLN
20.0000 mg | Freq: Once | INTRAVENOUS | Status: AC
  Administered 2017-05-01: 20 mg via INTRAVENOUS
  Filled 2017-05-01: qty 2

## 2017-05-01 MED ORDER — PEGFILGRASTIM 6 MG/0.6ML ~~LOC~~ PSKT: 6.0000 mg | PREFILLED_SYRINGE | Freq: Once | SUBCUTANEOUS | Status: DC

## 2017-05-01 MED ORDER — DIPHENHYDRAMINE HCL 50 MG/ML IJ SOLN
50.0000 mg | Freq: Once | INTRAMUSCULAR | Status: AC
  Administered 2017-05-01: 50 mg via INTRAVENOUS

## 2017-05-01 MED ORDER — ALBUTEROL SULFATE (2.5 MG/3ML) 0.083% IN NEBU
2.5000 mg | INHALATION_SOLUTION | Freq: Once | RESPIRATORY_TRACT | Status: AC
  Administered 2017-05-01: 2.5 mg via RESPIRATORY_TRACT
  Filled 2017-05-01: qty 3

## 2017-05-01 MED ORDER — SODIUM CHLORIDE 0.9 % IV SOLN
Freq: Once | INTRAVENOUS | Status: AC
  Administered 2017-05-01: 13:00:00 via INTRAVENOUS
  Filled 2017-05-01: qty 5

## 2017-05-01 MED ORDER — PALONOSETRON HCL INJECTION 0.25 MG/5ML
0.2500 mg | Freq: Once | INTRAVENOUS | Status: AC
  Administered 2017-05-01: 0.25 mg via INTRAVENOUS

## 2017-05-01 MED ORDER — PREDNISONE 2.5 MG PO TABS: ORAL_TABLET | ORAL | 0 refills | Status: DC

## 2017-05-01 MED ORDER — PACLITAXEL CHEMO INJECTION 300 MG/50ML
175.0000 mg/m2 | Freq: Once | INTRAVENOUS | Status: DC
  Filled 2017-05-01: qty 60

## 2017-05-01 MED ORDER — PALONOSETRON HCL INJECTION 0.25 MG/5ML
INTRAVENOUS | Status: AC
  Filled 2017-05-01: qty 5

## 2017-05-01 MED ORDER — ALBUTEROL SULFATE HFA 108 (90 BASE) MCG/ACT IN AERS: 2.0000 | INHALATION_SPRAY | RESPIRATORY_TRACT | 12 refills | Status: DC | PRN

## 2017-05-01 MED ORDER — DEXAMETHASONE SODIUM PHOSPHATE 10 MG/ML IJ SOLN
8.0000 mg | Freq: Once | INTRAMUSCULAR | Status: AC
  Administered 2017-05-01: 8 mg via INTRAVENOUS

## 2017-05-01 MED ORDER — SODIUM CHLORIDE 0.9 % IV SOLN
444.0000 mg | Freq: Once | INTRAVENOUS | Status: DC
  Filled 2017-05-01: qty 44

## 2017-05-01 MED ORDER — SODIUM CHLORIDE 0.9 % IV SOLN
200.0000 mg | Freq: Once | INTRAVENOUS | Status: AC
  Administered 2017-05-01: 200 mg via INTRAVENOUS
  Filled 2017-05-01: qty 8

## 2017-05-01 MED ORDER — SODIUM CHLORIDE 0.9 % IV SOLN: 8.0000 mg | Freq: Once | INTRAVENOUS | Status: DC

## 2017-05-01 MED ORDER — FAMOTIDINE IN NACL 20-0.9 MG/50ML-% IV SOLN: 20.0000 mg | Freq: Once | INTRAVENOUS | Status: DC

## 2017-05-01 MED ORDER — DEXAMETHASONE SODIUM PHOSPHATE 10 MG/ML IJ SOLN
INTRAMUSCULAR | Status: AC
  Filled 2017-05-01: qty 1

## 2017-05-01 NOTE — Progress Notes (Signed)
1500  Patient completed Keytruda without problems.  VS  BP140/70 O2 sat 86%  Sandi Mealy PA in to evaluate before proceeding with Taxol.  Patient given additional Decadron 8 mg.  O2 sat 88%.  Treatment for Taxol and Carboplatin on hold until tomorrow and patient will begin steroid dose pack tonight and albuterol inhalers tonight at home.  Patient states that she feels better than she did this am when she arrived to clinic. Patient and daughter agree to return to clinic tomorrow at 8 am to receive Taxol and Carboplatin with premeds.    Patient arrived to clinic dyspneic and fatigued.  Unable to walk to chair and needed wheelchair assistance.  She stated that she has felt this way for 2 days.    Patient with increase shortness of breath, Sandi Mealy to evaluate.  Patient to receive albuterol treatment with premeds due to wheezing. Proceed with treatment.

## 2017-05-02 ENCOUNTER — Inpatient Hospital Stay: Payer: Medicare Other

## 2017-05-02 VITALS — BP 136/76 | HR 79 | Temp 97.8°F | Resp 17

## 2017-05-02 DIAGNOSIS — R062 Wheezing: Secondary | ICD-10-CM | POA: Diagnosis not present

## 2017-05-02 DIAGNOSIS — Z5111 Encounter for antineoplastic chemotherapy: Secondary | ICD-10-CM | POA: Diagnosis not present

## 2017-05-02 DIAGNOSIS — R0902 Hypoxemia: Secondary | ICD-10-CM | POA: Diagnosis not present

## 2017-05-02 DIAGNOSIS — C3491 Malignant neoplasm of unspecified part of right bronchus or lung: Secondary | ICD-10-CM | POA: Diagnosis not present

## 2017-05-02 DIAGNOSIS — Z79899 Other long term (current) drug therapy: Secondary | ICD-10-CM | POA: Diagnosis not present

## 2017-05-02 DIAGNOSIS — C382 Malignant neoplasm of posterior mediastinum: Secondary | ICD-10-CM

## 2017-05-02 MED ORDER — ALBUTEROL SULFATE (2.5 MG/3ML) 0.083% IN NEBU
INHALATION_SOLUTION | RESPIRATORY_TRACT | Status: AC
  Filled 2017-05-02: qty 3

## 2017-05-02 MED ORDER — APREPITANT 40 MG PO CAPS: 80.0000 mg | ORAL_CAPSULE | Freq: Once | ORAL | Status: DC

## 2017-05-02 MED ORDER — SODIUM CHLORIDE 0.9 % IV SOLN
12.0000 mg | Freq: Once | INTRAVENOUS | Status: AC
  Administered 2017-05-02: 12 mg via INTRAVENOUS
  Filled 2017-05-02: qty 1.2

## 2017-05-02 MED ORDER — PEGFILGRASTIM 6 MG/0.6ML ~~LOC~~ PSKT
6.0000 mg | PREFILLED_SYRINGE | Freq: Once | SUBCUTANEOUS | Status: AC
  Administered 2017-05-02: 6 mg via SUBCUTANEOUS

## 2017-05-02 MED ORDER — SODIUM CHLORIDE 0.9 % IV SOLN
444.0000 mg | Freq: Once | INTRAVENOUS | Status: AC
  Administered 2017-05-02: 440 mg via INTRAVENOUS
  Filled 2017-05-02: qty 44

## 2017-05-02 MED ORDER — ALBUTEROL SULFATE (2.5 MG/3ML) 0.083% IN NEBU
2.5000 mg | INHALATION_SOLUTION | Freq: Once | RESPIRATORY_TRACT | Status: AC | PRN
  Administered 2017-05-02: 2.5 mg via RESPIRATORY_TRACT
  Filled 2017-05-02: qty 3

## 2017-05-02 MED ORDER — SODIUM CHLORIDE 0.9 % IV SOLN
20.0000 mg | Freq: Once | INTRAVENOUS | Status: AC
  Administered 2017-05-02: 20 mg via INTRAVENOUS
  Filled 2017-05-02: qty 2

## 2017-05-02 MED ORDER — ALBUTEROL SULFATE (2.5 MG/3ML) 0.083% IN NEBU
2.5000 mg | INHALATION_SOLUTION | Freq: Once | RESPIRATORY_TRACT | Status: AC
  Administered 2017-05-02: 2.5 mg via RESPIRATORY_TRACT
  Filled 2017-05-02: qty 3

## 2017-05-02 MED ORDER — SODIUM CHLORIDE 0.9 % IV SOLN
Freq: Once | INTRAVENOUS | Status: AC
  Administered 2017-05-02: 09:00:00 via INTRAVENOUS

## 2017-05-02 MED ORDER — SODIUM CHLORIDE 0.9 % IV SOLN
175.0000 mg/m2 | Freq: Once | INTRAVENOUS | Status: AC
  Administered 2017-05-02: 360 mg via INTRAVENOUS
  Filled 2017-05-02: qty 60

## 2017-05-02 MED ORDER — DIPHENHYDRAMINE HCL 50 MG/ML IJ SOLN
50.0000 mg | Freq: Once | INTRAMUSCULAR | Status: AC
  Administered 2017-05-02: 50 mg via INTRAVENOUS

## 2017-05-02 MED ORDER — DIPHENHYDRAMINE HCL 50 MG/ML IJ SOLN
INTRAMUSCULAR | Status: AC
  Filled 2017-05-02: qty 1

## 2017-05-02 MED ORDER — PEGFILGRASTIM 6 MG/0.6ML ~~LOC~~ PSKT
PREFILLED_SYRINGE | SUBCUTANEOUS | Status: AC
  Filled 2017-05-02: qty 0.6

## 2017-05-02 NOTE — Progress Notes (Signed)
[facility] Progress Note   Cassidy Sanders [id number] 03/08/1941 77 y.o.  Cassidy Sanders is managed by Dr. [name]  Actively treated with chemotherapy: yes  Current Therapy: Carboplatin, paclitaxel, and Keytruda.  Last Treated: 02/28/2018  Assessment: Plan:    Wheezing  Hypoxia   Wheezing and hypoxia: I was asked to see the patient while she was in the infusion room as she was noted to have oxygen desaturation to 87% despite receiving O2 via nasal cannula at 4 L/min.  The patient was given an albuterol nebulizer treatment.  She had been given 12 mg of Decadron as a premed prior to receiving Keytruda.  She was given 8 additional mg of Decadron IV.  Her O2 sats returned to 90% on 3 L by nasal cannula.  She was given a prescription for a prednisone taper which she will will begin tomorrow.  Additionally she was given a prescription for an albuterol inhaler with instructions to use 1-2 puffs every 4 hours as needed for shortness of breath or wheezing.  Her carboplatin and paclitaxel was held today.  She will return tomorrow for the remainder of her chemotherapy.  Per the nurse, this has been discussed with Dr. Julien Nordmann.  Please see After Visit Summary for patient specific instructions.  Future Appointments  Date Time Provider [place]  05/08/2017 11:00 AM [facility] 5 [facility] None  05/08/2017 11:30 AM Simonne Maffucci B, MD [facility] None  05/15/2017 11:00 AM [facility] 5 [facility] None  05/22/2017  8:45 AM [facility] LAB 2 [facility] None  05/22/2017  9:15 AM Curt Bears, MD [facility] None  05/22/2017 10:15 AM [facility] D13 [facility] None    No orders of the defined types were placed in this encounter.      Subjective:   Patient ID:  Cassidy Sanders is a 77 y.o. (DOB 1941/02/19) female.  Chief Complaint: No chief complaint on file.   HPI Cassidy Sanders is a 77 year old female with a metastatic non-small cell  lung carcinoma (T3, N2, M1a) which was originally diagnosed in November 2018 when she presented with a large right suprahilar and mediastinal mass.  She additionally had a right pleural effusion.  She is status post palliative radiotherapy to the mediastinal mass under the care of Dr. Lisbeth Renshaw.  She presents to clinic today for cycle 1, day 1 of carboplatin, paclitaxel, and Keytruda.  She had completed her Keytruda infusion.  She was noted to have wheezing in her upper lungs bilaterally and was having desaturation of her oxygen to around 87% despite the fact that she was receiving oxygen at 4 L/min via nasal cannula.  She continues to have a cough which is overall nonproductive.  She is on oxygen at home.  She is generally on 2 L/min but reports that last evening she had to increase her oxygen to 4 L/min due to shortness of breath and desaturation of her oxygen level to the upper 80s.  She denies fevers, chills, or sweats.  Medications: I have reviewed the patient's current medications.  Allergies:  Allergies  Allergen Reactions  . Latex Dermatitis  . Gabapentin Other (See Comments)    Excessive sleep   . Tape Rash    Past Medical History:  Diagnosis Date  . Brain tumor (benign) ([place])   . Depression   . Hypertension     Past Surgical History:  Procedure Laterality Date  . ABDOMINAL HYSTERECTOMY    . BLADDER SURGERY    . BRAIN SURGERY    .  ENDOBRONCHIAL ULTRASOUND Bilateral 02/06/2017   Procedure: ENDOBRONCHIAL ULTRASOUND;  Surgeon: Juanito Doom, MD;  Location: [facility];  Service: Cardiopulmonary;  Laterality: Bilateral;  . VIDEO BRONCHOSCOPY WITH ENDOBRONCHIAL ULTRASOUND N/A 03/23/2017   Procedure: VIDEO BRONCHOSCOPY WITH ENDOBRONCHIAL ULTRASOUND;  Surgeon: Juanito Doom, MD;  Location: [facility];  Service: Thoracic;  Laterality: N/A;    No family history on file.  Social History   Socioeconomic History  . Marital status: Married    Spouse name: Not on file  . Number of  children: Not on file  . Years of education: Not on file  . Highest education level: Not on file  Social Needs  . Financial resource strain: Not on file  . Food insecurity - worry: Not on file  . Food insecurity - inability: Not on file  . Transportation needs - medical: Not on file  . Transportation needs - non-medical: Not on file  Occupational History  . Not on file  Tobacco Use  . Smoking status: Former Smoker    Packs/day: 1.00    Years: 59.00    Pack years: 59.00    Types: Cigarettes    Last attempt to quit: 06/11/2012    Years since quitting: 4.8  . Smokeless tobacco: Never Used  Substance and Sexual Activity  . Alcohol use: Yes    Comment: weekly  . Drug use: No  . Sexual activity: Not on file  Other Topics Concern  . Not on file  Social History Narrative  . Not on file    Past Medical History, Surgical history, Social history, and Family history were reviewed and updated as appropriate.   Please see review of systems for further details on the patient's review from today.   Review of Systems:  Review of Systems  Constitutional: Negative for chills, diaphoresis and fever.  HENT: Negative for trouble swallowing.   Respiratory: Positive for cough, shortness of breath and wheezing. Negative for chest tightness.   Cardiovascular: Negative for chest pain and palpitations.    Objective:   Physical Exam:  There were no vitals taken for this visit. ECOG: 1  Physical Exam  Constitutional: No distress.  The patient is an elderly female who is receiving oxygen via nasal cannula and who appears to be in no apparent distress.  HENT:  Head: Normocephalic and atraumatic.  Cardiovascular: Normal rate, regular rhythm and normal heart sounds. Exam reveals no gallop and no friction rub.  No murmur heard. Pulmonary/Chest: Effort normal.      Neurological: She is alert.  Skin: Skin is warm and dry. No rash noted. She is not diaphoretic. No erythema.  Psychiatric: She  has a normal mood and affect. Her behavior is normal. Judgment and thought content normal.   Pre-albuterol exam: See above physical exam  Post-albuterol exam: Wheezing in the bilateral upper lobes resolved after a nebulizer treatment.  Decreased breath sounds continue in the right upper lobe.   Lab Review:     Component Value Date/Time   NA 142 05/01/2017 1040   NA 143 03/15/2017 1436   K 3.5 05/01/2017 1040   K 3.7 03/15/2017 1436   CL 101 05/01/2017 1040   CO2 30 (H) 05/01/2017 1040   CO2 34 (H) 03/15/2017 1436   GLUCOSE 109 05/01/2017 1040   GLUCOSE 92 03/15/2017 1436   BUN 13 05/01/2017 1040   BUN 17.8 03/15/2017 1436   CREATININE 0.73 05/01/2017 1040   CREATININE 0.8 03/15/2017 1436   CALCIUM 10.0 05/01/2017 1040  CALCIUM 9.6 03/15/2017 1436   PROT 7.3 05/01/2017 1040   PROT 6.9 03/15/2017 1436   ALBUMIN 3.0 (L) 05/01/2017 1040   ALBUMIN 3.3 (L) 03/15/2017 1436   AST 19 05/01/2017 1040   AST 16 03/15/2017 1436   ALT 15 05/01/2017 1040   ALT 15 03/15/2017 1436   ALKPHOS 98 05/01/2017 1040   ALKPHOS 88 03/15/2017 1436   BILITOT 0.7 05/01/2017 1040   BILITOT 0.52 03/15/2017 1436   GFRNONAA >60 05/01/2017 1040   GFRAA >60 05/01/2017 1040       Component Value Date/Time   WBC 8.5 05/01/2017 1040   WBC 5.7 04/17/2017 1020   RBC 4.08 05/01/2017 1040   HGB 12.2 04/17/2017 1020   HGB 11.8 03/15/2017 1436   HCT 37.2 05/01/2017 1040   HCT 37.8 03/15/2017 1436   PLT 262 05/01/2017 1040   PLT 215 03/15/2017 1436   MCV 91.2 05/01/2017 1040   MCV 92.4 03/15/2017 1436   MCH 28.9 05/01/2017 1040   MCHC 31.7 05/01/2017 1040   RDW 14.4 05/01/2017 1040   RDW 15.3 (H) 03/15/2017 1436   LYMPHSABS 0.5 (L) 05/01/2017 1040   LYMPHSABS 1.8 03/15/2017 1436   MONOABS 0.8 05/01/2017 1040   MONOABS 0.7 03/15/2017 1436   EOSABS 0.1 05/01/2017 1040   EOSABS 0.1 03/15/2017 1436   BASOSABS 0.0 05/01/2017 1040   BASOSABS 0.0 03/15/2017 1436    -------------------------------  Imaging from last 24 hours (if applicable):  Radiology interpretation: No results found.

## 2017-05-02 NOTE — Patient Instructions (Signed)
[facility] Discharge Instructions for Patients Receiving Chemotherapy  Today you received the following chemotherapy agents:  Taxol (paclitaxel), Carboplatin (paraplatin)  To help prevent nausea and vomiting after your treatment, we encourage you to take your nausea medication as prescribed.   If you develop nausea and vomiting that is not controlled by your nausea medication, call the clinic.   BELOW ARE SYMPTOMS THAT SHOULD BE REPORTED IMMEDIATELY:  *FEVER GREATER THAN 100.5 F  *CHILLS WITH OR WITHOUT FEVER  NAUSEA AND VOMITING THAT IS NOT CONTROLLED WITH YOUR NAUSEA MEDICATION  *UNUSUAL SHORTNESS OF BREATH  *UNUSUAL BRUISING OR BLEEDING  TENDERNESS IN MOUTH AND THROAT WITH OR WITHOUT PRESENCE OF ULCERS  *URINARY PROBLEMS  *BOWEL PROBLEMS  UNUSUAL RASH Items with * indicate a potential emergency and should be followed up as soon as possible.  Feel free to call the clinic should you have any questions or concerns. The clinic phone number is [phone].  Please show the [facility] at check-in to the Emergency Department and triage nurse.    Carboplatin injection What is this medicine? CARBOPLATIN (KAR boe pla tin) is a chemotherapy drug. It targets fast dividing cells, like cancer cells, and causes these cells to die. This medicine is used to treat ovarian cancer and many other cancers. This medicine may be used for other purposes; ask your health care provider or pharmacist if you have questions. COMMON BRAND NAME(S): Paraplatin What should I tell my health care provider before I take this medicine? They need to know if you have any of these conditions: -blood disorders -hearing problems -kidney disease -recent or ongoing radiation therapy -an unusual or allergic reaction to carboplatin, cisplatin, other chemotherapy, other medicines, foods, dyes, or preservatives -pregnant or trying to get pregnant -breast-feeding How should I use this  medicine? This drug is usually given as an infusion into a vein. It is administered in a hospital or clinic by a specially trained health care professional. Talk to your pediatrician regarding the use of this medicine in children. Special care may be needed. Overdosage: If you think you have taken too much of this medicine contact a poison control center or emergency room at once. NOTE: This medicine is only for you. Do not share this medicine with others. What if I miss a dose? It is important not to miss a dose. Call your doctor or health care professional if you are unable to keep an appointment. What may interact with this medicine? -medicines for seizures -medicines to increase blood counts like filgrastim, pegfilgrastim, sargramostim -some antibiotics like amikacin, gentamicin, neomycin, streptomycin, tobramycin -vaccines Talk to your doctor or health care professional before taking any of these medicines: -acetaminophen -aspirin -ibuprofen -ketoprofen -naproxen This list may not describe all possible interactions. Give your health care provider a list of all the medicines, herbs, non-prescription drugs, or dietary supplements you use. Also tell them if you smoke, drink alcohol, or use illegal drugs. Some items may interact with your medicine. What should I watch for while using this medicine? Your condition will be monitored carefully while you are receiving this medicine. You will need important blood work done while you are taking this medicine. This drug may make you feel generally unwell. This is not uncommon, as chemotherapy can affect healthy cells as well as cancer cells. Report any side effects. Continue your course of treatment even though you feel ill unless your doctor tells you to stop. In some cases, you may be given additional medicines to  help with side effects. Follow all directions for their use. Call your doctor or health care professional for advice if you get a  fever, chills or sore throat, or other symptoms of a cold or flu. Do not treat yourself. This drug decreases your body's ability to fight infections. Try to avoid being around people who are sick. This medicine may increase your risk to bruise or bleed. Call your doctor or health care professional if you notice any unusual bleeding. Be careful brushing and flossing your teeth or using a toothpick because you may get an infection or bleed more easily. If you have any dental work done, tell your dentist you are receiving this medicine. Avoid taking products that contain aspirin, acetaminophen, ibuprofen, naproxen, or ketoprofen unless instructed by your doctor. These medicines may hide a fever. Do not become pregnant while taking this medicine. Women should inform their doctor if they wish to become pregnant or think they might be pregnant. There is a potential for serious side effects to an unborn child. Talk to your health care professional or pharmacist for more information. Do not breast-feed an infant while taking this medicine. What side effects may I notice from receiving this medicine? Side effects that you should report to your doctor or health care professional as soon as possible: -allergic reactions like skin rash, itching or hives, swelling of the face, lips, or tongue -signs of infection - fever or chills, cough, sore throat, pain or difficulty passing urine -signs of decreased platelets or bleeding - bruising, pinpoint red spots on the skin, black, tarry stools, nosebleeds -signs of decreased red blood cells - unusually weak or tired, fainting spells, lightheadedness -breathing problems -changes in hearing -changes in vision -chest pain -high blood pressure -low blood counts - This drug may decrease the number of white blood cells, red blood cells and platelets. You may be at increased risk for infections and bleeding. -nausea and vomiting -pain, swelling, redness or irritation at the  injection site -pain, tingling, numbness in the hands or feet -problems with balance, talking, walking -trouble passing urine or change in the amount of urine Side effects that usually do not require medical attention (report to your doctor or health care professional if they continue or are bothersome): -hair loss -loss of appetite -metallic taste in the mouth or changes in taste This list may not describe all possible side effects. Call your doctor for medical advice about side effects. You may report side effects to FDA at 1-800-FDA-1088. Where should I keep my medicine? This drug is given in a hospital or clinic and will not be stored at home. NOTE: This sheet is a summary. It may not cover all possible information. If you have questions about this medicine, talk to your doctor, pharmacist, or health care provider.  2018 Elsevier/Gold Standard (2007-06-04 14:38:05)   Paclitaxel injection What is this medicine? PACLITAXEL (PAK li TAX el) is a chemotherapy drug. It targets fast dividing cells, like cancer cells, and causes these cells to die. This medicine is used to treat ovarian cancer, breast cancer, and other cancers. This medicine may be used for other purposes; ask your health care provider or pharmacist if you have questions. COMMON BRAND NAME(S): Onxol, Taxol What should I tell my health care provider before I take this medicine? They need to know if you have any of these conditions: -blood disorders -irregular heartbeat -infection (especially a virus infection such as chickenpox, cold sores, or herpes) -liver disease -previous or ongoing radiation therapy -  an unusual or allergic reaction to paclitaxel, alcohol, polyoxyethylated castor oil, other chemotherapy agents, other medicines, foods, dyes, or preservatives -pregnant or trying to get pregnant -breast-feeding How should I use this medicine? This drug is given as an infusion into a vein. It is administered in a hospital  or clinic by a specially trained health care professional. Talk to your pediatrician regarding the use of this medicine in children. Special care may be needed. Overdosage: If you think you have taken too much of this medicine contact a poison control center or emergency room at once. NOTE: This medicine is only for you. Do not share this medicine with others. What if I miss a dose? It is important not to miss your dose. Call your doctor or health care professional if you are unable to keep an appointment. What may interact with this medicine? Do not take this medicine with any of the following medications: -disulfiram -metronidazole This medicine may also interact with the following medications: -cyclosporine -diazepam -ketoconazole -medicines to increase blood counts like filgrastim, pegfilgrastim, sargramostim -other chemotherapy drugs like cisplatin, doxorubicin, epirubicin, etoposide, teniposide, vincristine -quinidine -testosterone -vaccines -verapamil Talk to your doctor or health care professional before taking any of these medicines: -acetaminophen -aspirin -ibuprofen -ketoprofen -naproxen This list may not describe all possible interactions. Give your health care provider a list of all the medicines, herbs, non-prescription drugs, or dietary supplements you use. Also tell them if you smoke, drink alcohol, or use illegal drugs. Some items may interact with your medicine. What should I watch for while using this medicine? Your condition will be monitored carefully while you are receiving this medicine. You will need important blood work done while you are taking this medicine. This medicine can cause serious allergic reactions. To reduce your risk you will need to take other medicine(s) before treatment with this medicine. If you experience allergic reactions like skin rash, itching or hives, swelling of the face, lips, or tongue, tell your doctor or health care professional  right away. In some cases, you may be given additional medicines to help with side effects. Follow all directions for their use. This drug may make you feel generally unwell. This is not uncommon, as chemotherapy can affect healthy cells as well as cancer cells. Report any side effects. Continue your course of treatment even though you feel ill unless your doctor tells you to stop. Call your doctor or health care professional for advice if you get a fever, chills or sore throat, or other symptoms of a cold or flu. Do not treat yourself. This drug decreases your body's ability to fight infections. Try to avoid being around people who are sick. This medicine may increase your risk to bruise or bleed. Call your doctor or health care professional if you notice any unusual bleeding. Be careful brushing and flossing your teeth or using a toothpick because you may get an infection or bleed more easily. If you have any dental work done, tell your dentist you are receiving this medicine. Avoid taking products that contain aspirin, acetaminophen, ibuprofen, naproxen, or ketoprofen unless instructed by your doctor. These medicines may hide a fever. Do not become pregnant while taking this medicine. Women should inform their doctor if they wish to become pregnant or think they might be pregnant. There is a potential for serious side effects to an unborn child. Talk to your health care professional or pharmacist for more information. Do not breast-feed an infant while taking this medicine. Men are advised not to  father a child while receiving this medicine. This product may contain alcohol. Ask your pharmacist or healthcare provider if this medicine contains alcohol. Be sure to tell all healthcare providers you are taking this medicine. Certain medicines, like metronidazole and disulfiram, can cause an unpleasant reaction when taken with alcohol. The reaction includes flushing, headache, nausea, vomiting, sweating, and  increased thirst. The reaction can last from 30 minutes to several hours. What side effects may I notice from receiving this medicine? Side effects that you should report to your doctor or health care professional as soon as possible: -allergic reactions like skin rash, itching or hives, swelling of the face, lips, or tongue -low blood counts - This drug may decrease the number of white blood cells, red blood cells and platelets. You may be at increased risk for infections and bleeding. -signs of infection - fever or chills, cough, sore throat, pain or difficulty passing urine -signs of decreased platelets or bleeding - bruising, pinpoint red spots on the skin, black, tarry stools, nosebleeds -signs of decreased red blood cells - unusually weak or tired, fainting spells, lightheadedness -breathing problems -chest pain -high or low blood pressure -mouth sores -nausea and vomiting -pain, swelling, redness or irritation at the injection site -pain, tingling, numbness in the hands or feet -slow or irregular heartbeat -swelling of the ankle, feet, hands Side effects that usually do not require medical attention (report to your doctor or health care professional if they continue or are bothersome): -bone pain -complete hair loss including hair on your head, underarms, pubic hair, eyebrows, and eyelashes -changes in the color of fingernails -diarrhea -loosening of the fingernails -loss of appetite -muscle or joint pain -red flush to skin -sweating This list may not describe all possible side effects. Call your doctor for medical advice about side effects. You may report side effects to FDA at 1-800-FDA-1088. Where should I keep my medicine? This drug is given in a hospital or clinic and will not be stored at home. NOTE: This sheet is a summary. It may not cover all possible information. If you have questions about this medicine, talk to your doctor, pharmacist, or health care provider.  2018  Elsevier/Gold Standard (2014-12-29 19:58:00)

## 2017-05-04 ENCOUNTER — Telehealth: Payer: Self-pay | Admitting: Medical Oncology

## 2017-05-04 ENCOUNTER — Other Ambulatory Visit: Payer: Self-pay | Admitting: Medical Oncology

## 2017-05-04 ENCOUNTER — Other Ambulatory Visit: Payer: Self-pay | Admitting: Oncology

## 2017-05-04 DIAGNOSIS — K5900 Constipation, unspecified: Secondary | ICD-10-CM

## 2017-05-04 LAB — GUARDANT 360

## 2017-05-04 MED ORDER — LACTULOSE 20 GM/30ML PO SOLN: 30.0000 mL | Freq: Four times a day (QID) | ORAL | 0 refills | Status: DC | PRN

## 2017-05-04 NOTE — Telephone Encounter (Signed)
Constipated-LBM 3 days ago. Pt action-she took glycerin supp x 2d , x-lax x 2d.(  On 2/19 she received premeds emend and aloxi pre immunotherpy). Pt notified that lactulose called in to her pharmacy.

## 2017-05-08 ENCOUNTER — Ambulatory Visit (INDEPENDENT_AMBULATORY_CARE_PROVIDER_SITE_OTHER)
Admission: RE | Admit: 2017-05-08 | Discharge: 2017-05-08 | Disposition: A | Discharge status: Home / Self Care | Payer: Medicare Other | Source: Ambulatory Visit | Attending: Pulmonary Disease | Admitting: Pulmonary Disease

## 2017-05-08 ENCOUNTER — Encounter: Payer: Self-pay | Admitting: *Deleted

## 2017-05-08 ENCOUNTER — Ambulatory Visit (INDEPENDENT_AMBULATORY_CARE_PROVIDER_SITE_OTHER): Payer: Medicare Other | Admitting: Pulmonary Disease

## 2017-05-08 ENCOUNTER — Encounter: Payer: Self-pay | Admitting: Pulmonary Disease

## 2017-05-08 ENCOUNTER — Inpatient Hospital Stay: Payer: Medicare Other

## 2017-05-08 VITALS — BP 118/60 | HR 80 | Ht 69.0 in | Wt 182.0 lb

## 2017-05-08 DIAGNOSIS — R0602 Shortness of breath: Secondary | ICD-10-CM | POA: Diagnosis not present

## 2017-05-08 DIAGNOSIS — R0902 Hypoxemia: Secondary | ICD-10-CM | POA: Diagnosis not present

## 2017-05-08 DIAGNOSIS — J90 Pleural effusion, not elsewhere classified: Secondary | ICD-10-CM | POA: Diagnosis not present

## 2017-05-08 DIAGNOSIS — C3491 Malignant neoplasm of unspecified part of right bronchus or lung: Secondary | ICD-10-CM | POA: Diagnosis not present

## 2017-05-08 DIAGNOSIS — Z79899 Other long term (current) drug therapy: Secondary | ICD-10-CM | POA: Diagnosis not present

## 2017-05-08 DIAGNOSIS — Z5111 Encounter for antineoplastic chemotherapy: Secondary | ICD-10-CM | POA: Diagnosis not present

## 2017-05-08 DIAGNOSIS — J9611 Chronic respiratory failure with hypoxia: Secondary | ICD-10-CM

## 2017-05-08 DIAGNOSIS — C382 Malignant neoplasm of posterior mediastinum: Secondary | ICD-10-CM

## 2017-05-08 DIAGNOSIS — R062 Wheezing: Secondary | ICD-10-CM | POA: Diagnosis not present

## 2017-05-08 DIAGNOSIS — R5382 Chronic fatigue, unspecified: Secondary | ICD-10-CM

## 2017-05-08 LAB — CBC WITH DIFFERENTIAL (CANCER CENTER ONLY)
BASOS PCT: 0 %
Basophils Absolute: 0 10*3/uL (ref 0.0–0.1)
EOS ABS: 0 10*3/uL (ref 0.0–0.5)
Eosinophils Relative: 1 %
HCT: 38.9 % (ref 34.8–46.6)
Hemoglobin: 12.2 g/dL (ref 11.6–15.9)
Lymphocytes Relative: 8 %
Lymphs Abs: 0.2 10*3/uL — ABNORMAL LOW (ref 0.9–3.3)
MCH: 28.6 pg (ref 25.1–34.0)
MCHC: 31.4 g/dL — ABNORMAL LOW (ref 31.5–36.0)
MCV: 91.1 fL (ref 79.5–101.0)
Monocytes Absolute: 0.1 10*3/uL (ref 0.1–0.9)
Monocytes Relative: 4 %
Neutro Abs: 2.7 10*3/uL (ref 1.5–6.5)
Neutrophils Relative %: 87 %
Platelet Count: 175 10*3/uL (ref 145–400)
RBC: 4.27 MIL/uL (ref 3.70–5.45)
RDW: 14.3 % (ref 11.2–14.5)
WBC: 3.1 10*3/uL — AB (ref 3.9–10.3)

## 2017-05-08 LAB — CMP (CANCER CENTER ONLY)
ALBUMIN: 2.8 g/dL — AB (ref 3.5–5.0)
ALK PHOS: 89 U/L (ref 40–150)
ALT: 17 U/L (ref 0–55)
AST: 19 U/L (ref 5–34)
Anion gap: 9 (ref 3–11)
BUN: 19 mg/dL (ref 7–26)
CALCIUM: 9.5 mg/dL (ref 8.4–10.4)
CO2: 39 mmol/L — AB (ref 22–29)
CREATININE: 0.77 mg/dL (ref 0.60–1.10)
Chloride: 93 mmol/L — ABNORMAL LOW (ref 98–109)
GFR, Est AFR Am: >60 mL/min (ref >60)
GFR, Estimated: >60 mL/min (ref >60)
GLUCOSE: 106 mg/dL (ref 70–140)
Potassium: 3.6 mmol/L (ref 3.5–5.1)
Sodium: 141 mmol/L (ref 136–145)
Total Bilirubin: 0.7 mg/dL (ref 0.2–1.2)
Total Protein: 6.4 g/dL (ref 6.4–8.3)

## 2017-05-08 LAB — TSH: TSH: 2.956 u[IU]/mL (ref 0.308–3.960)

## 2017-05-08 NOTE — Progress Notes (Signed)
Oncology Nurse Navigator Documentation  Oncology Nurse Navigator Flowsheets 05/08/2017  Navigator Location [facility]  Navigator Encounter Type Other/I received a message from Dr. Lake Bells. He states patient is in need of help at home. I updated Dr. Julien Nordmann and [facility] has been ordered.   Treatment Phase Treatment  Barriers/Navigation Needs Coordination of Care  Education Other  Interventions Coordination of Care  Coordination of Care Home Health  Acuity Level 2  Time Spent with Patient 30

## 2017-05-08 NOTE — Patient Instructions (Signed)
Shortness of breath: I am glad this is better Use albuterol as needed for chest tightness wheezing or shortness of breath We will check a chest x-ray today  Lung cancer: Keep follow-up with the oncology clinic  Chronic respiratory failure with hypoxemia: Keep using oxygen as prescribed  Deconditioning I will reach out to the lung cancer coordinator to see if there is a Education officer, museum who can talk to you about the needs you have in your home  We will see you back in 2 months or sooner if needed

## 2017-05-08 NOTE — Progress Notes (Signed)
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 Follow-up    ROV, shorntess of breath    Cassidy Sanders was put on prednisone a week or two ago because she has been having some trouble breathing.  She was put on some albuterol which helped.  She had the sudden onset of a raspy voice with the wheezing.  The dyspnea has resolved by this point.  She hasn't had any fever or sick contacts.   She is struggling with inability to take care of the house right now.  She says she feels generally weak and her husband has Alzheimer's disease this makes it very hard for her to get things done in the home.  She says that her shortness of breath has now resolved after taking the prednisone and albuterol.  Prior to this she had never been diagnosed with an underlying lung disease.  She has chronic respiratory failure with hypoxemia and continues to use and benefit from her oxygen.  Past Medical History:  Diagnosis Date  . Brain tumor (benign) ([place])   . Depression   . Hypertension      History reviewed. No pertinent family history.   Social History   Socioeconomic History  . Marital status: Married    Spouse name: Not on file  . Number of children: Not on file  . Years of education: Not on file  . Highest education level: Not on file  Social Needs  . Financial resource strain: Not on file  . Food insecurity - worry: Not on file  . Food insecurity - inability: Not on file  . Transportation needs - medical: Not on file  . Transportation needs - non-medical: Not on file  Occupational History  . Not on file  Tobacco Use  . Smoking status: Former Smoker    Packs/day: 1.00    Years: 59.00    Pack years: 59.00    Types: Cigarettes    Last attempt to quit: 06/11/2012    Years since quitting: 4.9  . Smokeless tobacco:  Never Used  Substance and Sexual Activity  . Alcohol use: Yes    Comment: weekly  . Drug use: No  . Sexual activity: Not on file  Other Topics Concern  . Not on file  Social History Narrative  . Not on file     Allergies  Allergen Reactions  . Latex Dermatitis  . Gabapentin Other (See Comments)    Excessive sleep   . Tape Rash     Outpatient Medications Prior to Visit  Medication Sig Dispense Refill  . albuterol (PROVENTIL HFA;VENTOLIN HFA) 108 (90 Base) MCG/ACT inhaler Inhale 2 puffs into the lungs every 4 (four) hours as needed for wheezing or shortness of breath. 1 Inhaler 12  . Biotin 2.5 MG TABS Take 2,500 mcg by mouth daily.    . chlorpheniramine-HYDROcodone (TUSSIONEX) 10-8 MG/5ML SUER Take 5 mLs by mouth every 12 (twelve) hours as needed for cough. 140 mL 0  . Lactulose 20 GM/30ML SOLN Take 30 mLs (20 g total) by mouth 4 (four) times daily as needed. 236 mL 0  . metoprolol tartrate (LOPRESSOR) 50 MG tablet Take 0.5 tablets (25 mg total) by mouth 2 (two) times daily. 30 tablet 11  . Multiple Vitamins-Minerals (ADULT GUMMY) CHEW Chew 1 tablet by mouth daily.    Marland Kitchen  prochlorperazine (COMPAZINE) 10 MG tablet Take 1 tablet (10 mg total) by mouth every 6 (six) hours as needed for nausea or vomiting. 30 tablet 0  . sertraline (ZOLOFT) 50 MG tablet Take 50 mg by mouth daily.    . predniSONE (DELTASONE) 2.5 MG tablet 6 x 1 day, 5 x 1 day, 4 x 1 day, 3 x 1 day, 2 x 1 day, 1 x 1 day 21 tablet 0  . vitamin E 400 UNIT capsule Take 400 Units by mouth daily.     No facility-administered medications prior to visit.     Review of Systems  Constitutional: Positive for malaise/fatigue.  HENT: Negative for congestion and ear discharge.   Respiratory: Positive for shortness of breath. Negative for cough, sputum production, wheezing and stridor.   Cardiovascular: Negative for claudication and leg swelling.  Neurological: Positive for weakness.      Objective:  Physical  Exam   Vitals:   05/08/17 1127  BP: 118/60  Pulse: 80  SpO2: 91%  Weight: 182 lb (82.6 kg)  Height: 5\' 9"  (1.753 m)   2L [place]  Gen: chronically ill appearing HENT: OP clear, TM's clear, neck supple PULM: Diminished RUL, normal percussion CV: RRR, no mgr, trace edema GI: BS+, soft, nontender Derm: no cyanosis or rash Psyche: normal mood and affect   CBC    Component Value Date/Time   WBC 3.1 (L) 05/08/2017 1052   WBC 5.7 04/17/2017 1020   RBC 4.27 05/08/2017 1052   HGB 12.2 04/17/2017 1020   HGB 11.8 03/15/2017 1436   HCT 38.9 05/08/2017 1052   HCT 37.8 03/15/2017 1436   PLT 175 05/08/2017 1052   PLT 215 03/15/2017 1436   MCV 91.1 05/08/2017 1052   MCV 92.4 03/15/2017 1436   MCH 28.6 05/08/2017 1052   MCHC 31.4 (L) 05/08/2017 1052   RDW 14.3 05/08/2017 1052   RDW 15.3 (H) 03/15/2017 1436   LYMPHSABS 0.2 (L) 05/08/2017 1052   LYMPHSABS 1.8 03/15/2017 1436   MONOABS 0.1 05/08/2017 1052   MONOABS 0.7 03/15/2017 1436   EOSABS 0.0 05/08/2017 1052   EOSABS 0.1 03/15/2017 1436   BASOSABS 0.0 05/08/2017 1052   BASOSABS 0.0 03/15/2017 1436     Chest imaging:  PFT:  Labs:  Path:  Echo:  Heart Catheterization:  Notes from her visit with Dr. Julien Nordmann reviewed, she is undergoing treatment of metastatic non-small cell lung cancer with XRT and chemo.  She saw a PA about a week ago for shortness of breath and was prescribed prednisone and albuterol.     Assessment & Plan:   SOB (shortness of breath) - Plan: DG Chest 2 View  Malignant neoplasm of posterior mediastinum (HCC)  Chronic respiratory failure with hypoxia (HCC)  Discussion: I worry about Ms. Widen as she appears to be weaker than the last time I saw her.  She has a large chest mass which we have attempted to obtain tissue from twice with a bronchoscopy and endobronchial ultrasound.  Both time she has had significant complications with the first episode having a cardiac arrest due to general  anesthesia and the second episode with significant bleeding.  I discussed her situation with Dr. Roxan Hockey and he agrees that trying to perform another biopsy would be difficult.  She has chronic respiratory failure with hypoxemia which seems to be stable.  Is not clear to me why she had this flareup of her shortness of breath recently, I bet it was probably acute bronchitis as it came  on suddenly and went away with prednisone.  However, considering her large lung mass we need to get a chest x-ray to make sure there is not something else going on.  Plan: Shortness of breath: I am glad this is better Use albuterol as needed for chest tightness wheezing or shortness of breath We will check a chest x-ray today  Lung cancer: Keep follow-up with the oncology clinic  Chronic respiratory failure with hypoxemia: Keep using oxygen as prescribed  Deconditioning I will reach out to the lung cancer coordinator to see if there is a Education officer, museum who can talk to you about the needs you have in your home  We will see you back in 2 months or sooner if needed    Current Outpatient Medications:  .  albuterol (PROVENTIL HFA;VENTOLIN HFA) 108 (90 Base) MCG/ACT inhaler, Inhale 2 puffs into the lungs every 4 (four) hours as needed for wheezing or shortness of breath., Disp: 1 Inhaler, Rfl: 12 .  Biotin 2.5 MG TABS, Take 2,500 mcg by mouth daily., Disp: , Rfl:  .  chlorpheniramine-HYDROcodone (TUSSIONEX) 10-8 MG/5ML SUER, Take 5 mLs by mouth every 12 (twelve) hours as needed for cough., Disp: 140 mL, Rfl: 0 .  Lactulose 20 GM/30ML SOLN, Take 30 mLs (20 g total) by mouth 4 (four) times daily as needed., Disp: 236 mL, Rfl: 0 .  metoprolol tartrate (LOPRESSOR) 50 MG tablet, Take 0.5 tablets (25 mg total) by mouth 2 (two) times daily., Disp: 30 tablet, Rfl: 11 .  Multiple Vitamins-Minerals (ADULT GUMMY) CHEW, Chew 1 tablet by mouth daily., Disp: , Rfl:  .  prochlorperazine (COMPAZINE) 10 MG tablet, Take 1  tablet (10 mg total) by mouth every 6 (six) hours as needed for nausea or vomiting., Disp: 30 tablet, Rfl: 0 .  sertraline (ZOLOFT) 50 MG tablet, Take 50 mg by mouth daily., Disp: , Rfl:

## 2017-05-09 ENCOUNTER — Other Ambulatory Visit: Payer: Self-pay

## 2017-05-09 DIAGNOSIS — J90 Pleural effusion, not elsewhere classified: Secondary | ICD-10-CM

## 2017-05-11 ENCOUNTER — Ambulatory Visit (HOSPITAL_COMMUNITY)
Admission: RE | Admit: 2017-05-11 | Discharge: 2017-05-11 | Disposition: A | Discharge status: Home / Self Care | Payer: Medicare Other | Source: Ambulatory Visit | Attending: Physician Assistant | Admitting: Physician Assistant

## 2017-05-11 ENCOUNTER — Ambulatory Visit (HOSPITAL_COMMUNITY)
Admission: RE | Admit: 2017-05-11 | Discharge: 2017-05-11 | Disposition: A | Discharge status: Home / Self Care | Payer: Medicare Other | Source: Ambulatory Visit | Attending: Pulmonary Disease | Admitting: Pulmonary Disease

## 2017-05-11 DIAGNOSIS — R918 Other nonspecific abnormal finding of lung field: Secondary | ICD-10-CM | POA: Diagnosis not present

## 2017-05-11 DIAGNOSIS — R846 Abnormal cytological findings in specimens from respiratory organs and thorax: Secondary | ICD-10-CM | POA: Diagnosis not present

## 2017-05-11 DIAGNOSIS — J90 Pleural effusion, not elsewhere classified: Secondary | ICD-10-CM | POA: Diagnosis not present

## 2017-05-11 LAB — PROTEIN, PLEURAL OR PERITONEAL FLUID: Total protein, fluid: 3.4 g/dL

## 2017-05-11 LAB — BODY FLUID CELL COUNT WITH DIFFERENTIAL
Lymphs, Fluid: 24 %
Monocyte-Macrophage-Serous Fluid: 56 % (ref 50–90)
NEUTROPHIL FLUID: 20 % (ref 0–25)
WBC FLUID: 747 uL (ref 0–1000)

## 2017-05-11 LAB — LACTATE DEHYDROGENASE, PLEURAL OR PERITONEAL FLUID: LD, Fluid: 153 U/L — ABNORMAL HIGH (ref 3–23)

## 2017-05-11 LAB — GLUCOSE, PLEURAL OR PERITONEAL FLUID: Glucose, Fluid: 112 mg/dL

## 2017-05-11 MED ORDER — LIDOCAINE HCL 1 % IJ SOLN
INTRAMUSCULAR | Status: AC
  Filled 2017-05-11: qty 10

## 2017-05-11 NOTE — Procedures (Signed)
PROCEDURE SUMMARY:  Successful US guided right thoracentesis. Yielded 1.3 liters of clear yellow fluid. Pt tolerated procedure well. No immediate complications.  Specimen was sent for labs.  Post procedure chest X-ray reveals no pneumothorax  Jilliann Subramanian S Karlisa Gaubert PA-C 05/11/2017 1:28 PM

## 2017-05-14 LAB — BODY FLUID CULTURE: CULTURE: NO GROWTH

## 2017-05-15 ENCOUNTER — Ambulatory Visit: Payer: Self-pay | Admitting: Radiation Oncology

## 2017-05-15 ENCOUNTER — Inpatient Hospital Stay: Payer: Medicare Other | Attending: Internal Medicine

## 2017-05-15 DIAGNOSIS — Z5111 Encounter for antineoplastic chemotherapy: Secondary | ICD-10-CM | POA: Diagnosis not present

## 2017-05-15 DIAGNOSIS — C3491 Malignant neoplasm of unspecified part of right bronchus or lung: Secondary | ICD-10-CM | POA: Insufficient documentation

## 2017-05-15 DIAGNOSIS — Z5112 Encounter for antineoplastic immunotherapy: Secondary | ICD-10-CM | POA: Insufficient documentation

## 2017-05-15 DIAGNOSIS — C778 Secondary and unspecified malignant neoplasm of lymph nodes of multiple regions: Secondary | ICD-10-CM | POA: Diagnosis not present

## 2017-05-15 DIAGNOSIS — Z79899 Other long term (current) drug therapy: Secondary | ICD-10-CM | POA: Diagnosis not present

## 2017-05-15 DIAGNOSIS — C382 Malignant neoplasm of posterior mediastinum: Secondary | ICD-10-CM

## 2017-05-15 LAB — CMP (CANCER CENTER ONLY)
ALBUMIN: 2.7 g/dL — AB (ref 3.5–5.0)
ALT: 16 U/L (ref 0–55)
ANION GAP: 9 (ref 3–11)
AST: 20 U/L (ref 5–34)
Alkaline Phosphatase: 97 U/L (ref 40–150)
BILIRUBIN TOTAL: 0.5 mg/dL (ref 0.2–1.2)
BUN: 13 mg/dL (ref 7–26)
CO2: 32 mmol/L — ABNORMAL HIGH (ref 22–29)
Calcium: 9.3 mg/dL (ref 8.4–10.4)
Chloride: 97 mmol/L — ABNORMAL LOW (ref 98–109)
Creatinine: 0.73 mg/dL (ref 0.60–1.10)
GLUCOSE: 91 mg/dL (ref 70–140)
POTASSIUM: 3.8 mmol/L (ref 3.5–5.1)
Sodium: 138 mmol/L (ref 136–145)
TOTAL PROTEIN: 7 g/dL (ref 6.4–8.3)

## 2017-05-15 LAB — CBC WITH DIFFERENTIAL (CANCER CENTER ONLY)
BASOS PCT: 1 %
Basophils Absolute: 0 10*3/uL (ref 0.0–0.1)
EOS ABS: 0 10*3/uL (ref 0.0–0.5)
Eosinophils Relative: 2 %
HEMATOCRIT: 37.5 % (ref 34.8–46.6)
Hemoglobin: 11.6 g/dL (ref 11.6–15.9)
Lymphocytes Relative: 31 %
Lymphs Abs: 0.7 10*3/uL — ABNORMAL LOW (ref 0.9–3.3)
MCH: 27.7 pg (ref 25.1–34.0)
MCHC: 30.9 g/dL — AB (ref 31.5–36.0)
MCV: 89.5 fL (ref 79.5–101.0)
MONO ABS: 0.5 10*3/uL (ref 0.1–0.9)
MONOS PCT: 24 %
Neutro Abs: 1 10*3/uL — ABNORMAL LOW (ref 1.5–6.5)
Neutrophils Relative %: 42 %
Platelet Count: 217 10*3/uL (ref 145–400)
RBC: 4.19 MIL/uL (ref 3.70–5.45)
RDW: 14.6 % — AB (ref 11.2–14.5)
WBC Count: 2.3 10*3/uL — ABNORMAL LOW (ref 3.9–10.3)

## 2017-05-17 ENCOUNTER — Other Ambulatory Visit: Payer: Self-pay

## 2017-05-17 ENCOUNTER — Encounter (HOSPITAL_COMMUNITY): Payer: Self-pay

## 2017-05-17 ENCOUNTER — Emergency Department (HOSPITAL_COMMUNITY)
Admission: EM | Admit: 2017-05-17 | Discharge: 2017-05-17 | Disposition: A | Discharge status: Home / Self Care | Payer: Medicare Other | Attending: Emergency Medicine | Admitting: Emergency Medicine

## 2017-05-17 ENCOUNTER — Emergency Department (HOSPITAL_COMMUNITY): Payer: Medicare Other

## 2017-05-17 ENCOUNTER — Telehealth: Payer: Self-pay | Admitting: *Deleted

## 2017-05-17 DIAGNOSIS — Z79899 Other long term (current) drug therapy: Secondary | ICD-10-CM | POA: Insufficient documentation

## 2017-05-17 DIAGNOSIS — R062 Wheezing: Secondary | ICD-10-CM | POA: Insufficient documentation

## 2017-05-17 DIAGNOSIS — I10 Essential (primary) hypertension: Secondary | ICD-10-CM | POA: Diagnosis not present

## 2017-05-17 DIAGNOSIS — Z87891 Personal history of nicotine dependence: Secondary | ICD-10-CM | POA: Diagnosis not present

## 2017-05-17 DIAGNOSIS — Z8529 Personal history of malignant neoplasm of other respiratory and intrathoracic organs: Secondary | ICD-10-CM | POA: Diagnosis not present

## 2017-05-17 DIAGNOSIS — J90 Pleural effusion, not elsewhere classified: Secondary | ICD-10-CM | POA: Diagnosis not present

## 2017-05-17 DIAGNOSIS — R0602 Shortness of breath: Secondary | ICD-10-CM | POA: Diagnosis not present

## 2017-05-17 DIAGNOSIS — Z91040 Latex allergy status: Secondary | ICD-10-CM | POA: Diagnosis not present

## 2017-05-17 LAB — COMPREHENSIVE METABOLIC PANEL
ALBUMIN: 2.9 g/dL — AB (ref 3.5–5.0)
ALK PHOS: 95 U/L (ref 38–126)
ALT: 15 U/L (ref 14–54)
AST: 23 U/L (ref 15–41)
Anion gap: 10 (ref 5–15)
BUN: 13 mg/dL (ref 6–20)
CALCIUM: 8.8 mg/dL — AB (ref 8.9–10.3)
CO2: 32 mmol/L (ref 22–32)
CREATININE: 0.72 mg/dL (ref 0.44–1.00)
Chloride: 100 mmol/L — ABNORMAL LOW (ref 101–111)
GFR calc Af Amer: >60 mL/min (ref >60)
GFR calc non Af Amer: >60 mL/min (ref >60)
GLUCOSE: 121 mg/dL — AB (ref 65–99)
Potassium: 3.2 mmol/L — ABNORMAL LOW (ref 3.5–5.1)
SODIUM: 142 mmol/L (ref 135–145)
Total Bilirubin: 0.4 mg/dL (ref 0.3–1.2)
Total Protein: 6.8 g/dL (ref 6.5–8.1)

## 2017-05-17 LAB — I-STAT CG4 LACTIC ACID, ED: Lactic Acid, Venous: 1.84 mmol/L (ref 0.5–1.9)

## 2017-05-17 LAB — CBC WITH DIFFERENTIAL/PLATELET
BASOS PCT: 1 %
Basophils Absolute: 0 10*3/uL (ref 0.0–0.1)
EOS ABS: 0.1 10*3/uL (ref 0.0–0.7)
Eosinophils Relative: 2 %
HCT: 36.9 % (ref 36.0–46.0)
HEMOGLOBIN: 11.7 g/dL — AB (ref 12.0–15.0)
LYMPHS ABS: 1 10*3/uL (ref 0.7–4.0)
Lymphocytes Relative: 25 %
MCH: 27.8 pg (ref 26.0–34.0)
MCHC: 31.7 g/dL (ref 30.0–36.0)
MCV: 87.6 fL (ref 78.0–100.0)
Monocytes Absolute: 0.8 10*3/uL (ref 0.1–1.0)
Monocytes Relative: 20 %
Neutro Abs: 2 10*3/uL (ref 1.7–7.7)
Neutrophils Relative %: 52 %
Platelets: 248 10*3/uL (ref 150–400)
RBC: 4.21 MIL/uL (ref 3.87–5.11)
RDW: 14.7 % (ref 11.5–15.5)
WBC: 3.8 10*3/uL — AB (ref 4.0–10.5)

## 2017-05-17 MED ORDER — METHYLPREDNISOLONE SODIUM SUCC 125 MG IJ SOLR
125.0000 mg | Freq: Once | INTRAMUSCULAR | Status: AC
  Administered 2017-05-17: 125 mg via INTRAVENOUS
  Filled 2017-05-17: qty 2

## 2017-05-17 NOTE — Progress Notes (Addendum)
CSW received a call from pt's RN stating pt is requesting to borrow a tank of air so she can transport home in a private vehicle.  Per pt's RN, pt ws BIB by EMS but EMS did not inform pt she should bring her own air tank to the ED.   CSW called the [facility] ED CM covering [facility] ED and the CM stated there is no air to borrow, and that pt's options are to either:  1. Request a friend or family member to retrieve pt's air supply from pt's home and transport it to the ED or..  2. Transport home via PTAR due to pt's need for oxygen requiring pt to use PTAR.  [facility] ED CM stateds CM always encourages pt's to enlist the help of friends or family in order to avoid paying the higher possible cost of transporting by PTAR.  CSW will update pt's RN.  CSW will continue to follow for D/C needs.  Alphonse Guild. Jabarie Pop, LCSW, LCAS, CSI Clinical Social Worker Ph: [phone]

## 2017-05-17 NOTE — Telephone Encounter (Signed)
Patient called c/o "SOB that I can't control".  She states she has been like this for 2 days.   Advised patient to call 911.  She is there with her husband who has [place].   Let her know that EMS will come and evaluate her.  If they feel it is safe to bring her to [facility] ED, she can request that, otherwise they will take her to nearest ED.  Patient states that she will call 911 or call someone to take her.  Advised her to call 911 so that she can get EMS to evaluate and transport her and she will receive care much faster and will have a better outcome that having someone come and take her.  She verbalized that she will call 911 and she feels comfortable doing this.  Dr. Julien Nordmann advised of conversation with patient.

## 2017-05-17 NOTE — ED Provider Notes (Signed)
[facility] DEPT Provider Note   CSN: [id number] Arrival date & time: 05/17/17  1522     History   Chief Complaint Chief Complaint  Patient presents with  . Shortness of Breath    HPI Cassidy Sanders is a 77 y.o. female.  Patient complains of shortness of breath.  Patient has history of lung disease and states sometimes she has episodes of wheezing and shortness of breath and she uses her inhaler.   The history is provided by the patient.  Shortness of Breath  This is a recurrent problem. The problem occurs continuously.The current episode started less than 1 hour ago. Associated symptoms include wheezing. Pertinent negatives include no fever, no headaches, no cough, no chest pain, no abdominal pain and no rash. It is unknown what precipitated the problem. Risk factors: Lung cancer. The treatment provided mild relief.    Past Medical History:  Diagnosis Date  . Brain tumor (benign) ([place])   . Depression   . Hypertension     Patient Active Problem List   Diagnosis Date Noted  . Chronic respiratory failure with hypoxia ([place]) 03/22/2017  . Malignant neoplasm of posterior mediastinum ([place]) 02/20/2017  . Physical deconditioning 02/12/2017  . Pain of upper abdomen   . Acute respiratory failure ([place])   . SOB (shortness of breath)   . Hemoptysis   . Essential hypertension 02/01/2017  . Lung mass 02/01/2017  . Mediastinal mass   . 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 ABDOMINAL HYSTERECTOMY    . BLADDER SURGERY    . BRAIN SURGERY    . ENDOBRONCHIAL ULTRASOUND Bilateral 02/06/2017   Procedure: ENDOBRONCHIAL ULTRASOUND;  Surgeon: Juanito Doom, MD;  Location: [facility] ENDOSCOPY;  Service: Cardiopulmonary;  Laterality: Bilateral;  . VIDEO BRONCHOSCOPY WITH ENDOBRONCHIAL ULTRASOUND N/A 03/23/2017   Procedure: VIDEO BRONCHOSCOPY WITH ENDOBRONCHIAL ULTRASOUND;  Surgeon: Juanito Doom, MD;  Location: [facility]  OR;  Service: Thoracic;  Laterality: N/A;    OB History    No data available       Home Medications    Prior to Admission medications   Medication Sig Start Date End Date Taking? Authorizing Provider  albuterol (PROVENTIL HFA;VENTOLIN HFA) 108 (90 Base) MCG/ACT inhaler Inhale 2 puffs into the lungs every 4 (four) hours as needed for wheezing or shortness of breath. 05/01/17  Yes Tanner, Lyndon Code., PA-C  Biotin 2.5 MG TABS Take 2,500 mcg by mouth daily.   Yes [provider]  hydrochlorothiazide (MICROZIDE) 12.5 MG capsule Take 12.5 mg by mouth daily. 04/28/17  Yes [provider]  Lactulose 20 GM/30ML SOLN Take 30 mLs (20 g total) by mouth 4 (four) times daily as needed. Patient taking differently: Take 30 mLs by mouth 4 (four) times daily as needed (PRN for constipation).  05/04/17  Yes Curcio, Roselie Awkward, NP  metoprolol tartrate (LOPRESSOR) 50 MG tablet Take 0.5 tablets (25 mg total) by mouth 2 (two) times daily. Patient taking differently: Take 50 mg by mouth 2 (two) times daily.  02/12/17 02/12/18 Yes Erick Colace, NP  Multiple Vitamins-Minerals (ADULT GUMMY) CHEW Chew 1 tablet by mouth daily.   Yes [provider]  prochlorperazine (COMPAZINE) 10 MG tablet Take 1 tablet (10 mg total) by mouth every 6 (six) hours as needed for nausea or vomiting. 04/17/17  Yes Curt Bears, MD  sertraline (ZOLOFT) 50 MG tablet Take 50 mg by mouth daily.   Yes [provider]  chlorpheniramine-HYDROcodone (  TUSSIONEX) 10-8 MG/5ML SUER Take 5 mLs by mouth every 12 (twelve) hours as needed for cough. Patient not taking: Reported on 05/17/2017 03/23/17   Juanito Doom, MD    Family History History reviewed. No pertinent family history.  Social History Social History   Tobacco Use  . Smoking status: Former Smoker    Packs/day: 1.00    Years: 59.00    Pack years: 59.00    Types: Cigarettes    Last attempt to quit: 06/11/2012    Years since quitting: 4.9  .  Smokeless tobacco: Never Used  Substance Use Topics  . Alcohol use: Yes    Comment: weekly  . Drug use: No     Allergies   Latex; Gabapentin; and Tape   Review of Systems Review of Systems  Constitutional: Negative for appetite change, fatigue and fever.  HENT: Negative for congestion, ear discharge and sinus pressure.   Eyes: Negative for discharge.  Respiratory: Positive for shortness of breath and wheezing. Negative for cough.   Cardiovascular: Negative for chest pain.  Gastrointestinal: Negative for abdominal pain and diarrhea.  Genitourinary: Negative for frequency and hematuria.  Musculoskeletal: Negative for back pain.  Skin: Negative for rash.  Neurological: Negative for seizures and headaches.  Psychiatric/Behavioral: Negative for hallucinations.     Physical Exam Updated Vital Signs BP 123/78 (BP Location: Right Arm)   Pulse (!) 113   Temp 99.2 F (37.3 C) (Oral)   Resp (!) 24   Ht '5\' 9"'$  (1.753 m)   SpO2 96%   BMI 26.88 kg/m   Physical Exam  Constitutional: She is oriented to person, place, and time. She appears well-developed.  HENT:  Head: Normocephalic.  Eyes: Conjunctivae and EOM are normal. No scleral icterus.  Neck: Neck supple. No thyromegaly present.  Cardiovascular: Normal rate and regular rhythm. Exam reveals no gallop and no friction rub.  No murmur heard. Pulmonary/Chest: No stridor. She has wheezes. She has no rales. She exhibits no tenderness.  Abdominal: She exhibits no distension. There is no tenderness. There is no rebound.  Musculoskeletal: Normal range of motion. She exhibits no edema.  Lymphadenopathy:    She has no cervical adenopathy.  Neurological: She is oriented to person, place, and time. She exhibits normal muscle tone. Coordination normal.  Skin: No rash noted. No erythema.  Psychiatric: She has a normal mood and affect. Her behavior is normal.     ED Treatments / Results  Labs (all labs ordered are listed, but only  abnormal results are displayed) Labs Reviewed  CBC WITH DIFFERENTIAL/PLATELET - Abnormal; Notable for the following components:      Result Value   WBC 3.8 (*)    Hemoglobin 11.7 (*)    All other components within normal limits  COMPREHENSIVE METABOLIC PANEL - Abnormal; Notable for the following components:   Potassium 3.2 (*)    Chloride 100 (*)    Glucose, Bld 121 (*)    Calcium 8.8 (*)    Albumin 2.9 (*)    All other components within normal limits  I-STAT CG4 LACTIC ACID, ED  I-STAT CG4 LACTIC ACID, ED    EKG  EKG Interpretation  Date/Time:  Thursday May 17 2017 15:46:32 EST Ventricular Rate:  113 PR Interval:    QRS Duration: 87 QT Interval:  314 QTC Calculation: 431 R Axis:   -25 Text Interpretation:  Sinus tachycardia Borderline left axis deviation Abnormal inferior Q waves Anterior infarct, old Confirmed by Milton Ferguson [phone]) on 05/17/2017 3:54:56 PM  Also confirmed by Milton Ferguson [phone]), editor Philomena Doheny [phone])  on 05/17/2017 4:12:23 PM Also confirmed by Milton Ferguson [phone])  on 05/17/2017 5:53:49 PM       Radiology Dg Chest Port 1 View  Result Date: 05/17/2017 CLINICAL DATA:  Shortness of for 2 days, lung cancer, received first chemotherapy round 2 weeks ago hypertension, former smoker EXAM: PORTABLE CHEST 1 VIEW COMPARISON:  Portable exam 1555 hours compared to 05/11/2017; correlation PET-CT 03/12/2017 FINDINGS: Enlargement of cardiac silhouette. Atherosclerotic calcification aorta. Abnormal soft tissue density RIGHT paratracheal associated with RIGHT hilar enlargement corresponding to known tumor on prior PET-CT. Associated volume loss in RIGHT upper lobe. Basilar RIGHT pleural effusion and atelectasis. Question BILATERAL lower lobe infiltrates. Remaining LEFT lung clear. No pneumothorax. Bones unremarkable. IMPRESSION: Enlargement of cardiac silhouette with RIGHT pleural effusion and mild RIGHT basilar atelectasis. Questionable bibasilar infiltrates. 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 Electronically Signed   By: Lavonia Dana M.D.   On: 05/17/2017 16:21    Procedures Procedures (including critical care time)  Medications Ordered in ED Medications  methylPREDNISolone sodium succinate (SOLU-MEDROL) 125 mg/2 mL injection 125 mg (125 mg Intravenous Given 05/17/17 1603)     Initial Impression / Assessment and Plan / ED Course  I have reviewed the triage vital signs and the nursing notes.  Pertinent labs & imaging results that were available during my care of the patient were reviewed by me and considered in my medical decision making (see chart for details).     Patient's wheezing has improved with neb treatment.  Chest x-ray does show a lung mass with fluid in her lung.  Not significantly worse.  Patient's O2 sats on HER-2 liters is 95% vital signs normal.  Patient wants to be discharged home to follow-up with her doctor.`  Final Clinical Impressions(s) / ED Diagnoses   Final diagnoses:  SOB (shortness of breath)    ED Discharge Orders    None       Milton Ferguson, MD 05/17/17 1810

## 2017-05-17 NOTE — Discharge Instructions (Signed)
Call your doctor tomorrow and let him know that you were here from some shortness of breath.  Return to the emergency department if needed.  Otherwise follow-up with your doctor as planned next week.  Use your inhaler every 4 hours if necessary

## 2017-05-17 NOTE — ED Triage Notes (Signed)
Patient BIB EMS from [facility], with complaints of SOB x2 days. Patient is Lung cancer patient, diagnosed in Nov 2018. Patient received her first round of chemo 2 weeks ago. Patient received duoneb via EMS. 18G L FA PIV placed by EMS. Patient reports cough, nausea, vomiting, dizziness, weakness since chemo 2 weeks ago.

## 2017-05-17 NOTE — ED Notes (Signed)
Bed: WA06 Expected date:  Expected time:  Means of arrival:  Comments: 77 yo f sob

## 2017-05-18 ENCOUNTER — Telehealth: Payer: Self-pay | Admitting: Medical Oncology

## 2017-05-18 NOTE — Telephone Encounter (Signed)
She went to ED and is home and is better.. She said thank you for sending her there.

## 2017-05-21 NOTE — Telephone Encounter (Signed)
I called Cassidy Sanders's daughter to review the pleural fluid analysis.  We discussed the fact that she had been seen in the emergency department recently for wheezing.  I offered a clinic visit.  She asked me to contact her mother directly.  When I called I had to leave a message.  If the patient calls back I am very happy to see her this afternoon in the [facility] office.

## 2017-05-22 ENCOUNTER — Telehealth: Payer: Self-pay | Admitting: Internal Medicine

## 2017-05-22 ENCOUNTER — Inpatient Hospital Stay (HOSPITAL_BASED_OUTPATIENT_CLINIC_OR_DEPARTMENT_OTHER): Payer: Medicare Other | Admitting: Internal Medicine

## 2017-05-22 ENCOUNTER — Encounter: Payer: Self-pay | Admitting: Internal Medicine

## 2017-05-22 ENCOUNTER — Inpatient Hospital Stay: Payer: Medicare Other

## 2017-05-22 VITALS — BP 116/61 | HR 84 | Temp 98.2°F | Resp 24 | Ht 69.0 in | Wt 174.8 lb

## 2017-05-22 DIAGNOSIS — C382 Malignant neoplasm of posterior mediastinum: Secondary | ICD-10-CM

## 2017-05-22 DIAGNOSIS — C778 Secondary and unspecified malignant neoplasm of lymph nodes of multiple regions: Secondary | ICD-10-CM

## 2017-05-22 DIAGNOSIS — Z79899 Other long term (current) drug therapy: Secondary | ICD-10-CM | POA: Diagnosis not present

## 2017-05-22 DIAGNOSIS — C3491 Malignant neoplasm of unspecified part of right bronchus or lung: Secondary | ICD-10-CM | POA: Diagnosis not present

## 2017-05-22 DIAGNOSIS — Z5112 Encounter for antineoplastic immunotherapy: Secondary | ICD-10-CM | POA: Diagnosis not present

## 2017-05-22 DIAGNOSIS — Z7189 Other specified counseling: Secondary | ICD-10-CM

## 2017-05-22 DIAGNOSIS — Z5111 Encounter for antineoplastic chemotherapy: Secondary | ICD-10-CM | POA: Diagnosis not present

## 2017-05-22 DIAGNOSIS — E876 Hypokalemia: Secondary | ICD-10-CM

## 2017-05-22 DIAGNOSIS — J90 Pleural effusion, not elsewhere classified: Secondary | ICD-10-CM | POA: Diagnosis not present

## 2017-05-22 LAB — CMP (CANCER CENTER ONLY)
ALK PHOS: 106 U/L (ref 40–150)
ALT: 13 U/L (ref 0–55)
ANION GAP: 9 (ref 3–11)
AST: 14 U/L (ref 5–34)
Albumin: 2.4 g/dL — ABNORMAL LOW (ref 3.5–5.0)
BILIRUBIN TOTAL: 0.5 mg/dL (ref 0.2–1.2)
BUN: 16 mg/dL (ref 7–26)
CALCIUM: 9.2 mg/dL (ref 8.4–10.4)
CO2: 32 mmol/L — AB (ref 22–29)
CREATININE: 0.8 mg/dL (ref 0.60–1.10)
Chloride: 98 mmol/L (ref 98–109)
Glucose, Bld: 177 mg/dL — ABNORMAL HIGH (ref 70–140)
Potassium: 3.2 mmol/L — ABNORMAL LOW (ref 3.5–5.1)
Sodium: 139 mmol/L (ref 136–145)
TOTAL PROTEIN: 6.7 g/dL (ref 6.4–8.3)

## 2017-05-22 LAB — CBC WITH DIFFERENTIAL (CANCER CENTER ONLY)
BASOS ABS: 0 10*3/uL (ref 0.0–0.1)
BASOS PCT: 0 %
EOS ABS: 0.1 10*3/uL (ref 0.0–0.5)
Eosinophils Relative: 1 %
HEMATOCRIT: 38.6 % (ref 34.8–46.6)
HEMOGLOBIN: 12 g/dL (ref 11.6–15.9)
Lymphocytes Relative: 5 %
Lymphs Abs: 0.4 10*3/uL — ABNORMAL LOW (ref 0.9–3.3)
MCH: 27.2 pg (ref 25.1–34.0)
MCHC: 31.1 g/dL — AB (ref 31.5–36.0)
MCV: 87.5 fL (ref 79.5–101.0)
MONO ABS: 0.6 10*3/uL (ref 0.1–0.9)
MONOS PCT: 9 %
NEUTROS ABS: 5.6 10*3/uL (ref 1.5–6.5)
NEUTROS PCT: 85 %
Platelet Count: 215 10*3/uL (ref 145–400)
RBC: 4.41 MIL/uL (ref 3.70–5.45)
RDW: 14.9 % — AB (ref 11.2–14.5)
WBC Count: 6.6 10*3/uL (ref 3.9–10.3)

## 2017-05-22 MED ORDER — DIPHENHYDRAMINE HCL 50 MG/ML IJ SOLN
50.0000 mg | Freq: Once | INTRAMUSCULAR | Status: AC
  Administered 2017-05-22: 50 mg via INTRAVENOUS

## 2017-05-22 MED ORDER — SODIUM CHLORIDE 0.9 % IV SOLN
20.0000 mg | Freq: Once | INTRAVENOUS | Status: AC
  Administered 2017-05-22: 20 mg via INTRAVENOUS
  Filled 2017-05-22: qty 2

## 2017-05-22 MED ORDER — PALONOSETRON HCL INJECTION 0.25 MG/5ML
0.2500 mg | Freq: Once | INTRAVENOUS | Status: AC
  Administered 2017-05-22: 0.25 mg via INTRAVENOUS

## 2017-05-22 MED ORDER — PEGFILGRASTIM 6 MG/0.6ML ~~LOC~~ PSKT
6.0000 mg | PREFILLED_SYRINGE | Freq: Once | SUBCUTANEOUS | Status: AC
  Administered 2017-05-22: 6 mg via SUBCUTANEOUS

## 2017-05-22 MED ORDER — DIPHENHYDRAMINE HCL 50 MG/ML IJ SOLN
INTRAMUSCULAR | Status: AC
  Filled 2017-05-22: qty 1

## 2017-05-22 MED ORDER — PACLITAXEL CHEMO INJECTION 300 MG/50ML
175.0000 mg/m2 | Freq: Once | INTRAVENOUS | Status: AC
  Administered 2017-05-22: 360 mg via INTRAVENOUS
  Filled 2017-05-22: qty 60

## 2017-05-22 MED ORDER — FOSAPREPITANT DIMEGLUMINE INJECTION 150 MG
Freq: Once | INTRAVENOUS | Status: AC
  Administered 2017-05-22: 11:00:00 via INTRAVENOUS
  Filled 2017-05-22: qty 5

## 2017-05-22 MED ORDER — PALONOSETRON HCL INJECTION 0.25 MG/5ML
INTRAVENOUS | Status: AC
  Filled 2017-05-22: qty 5

## 2017-05-22 MED ORDER — PEGFILGRASTIM 6 MG/0.6ML ~~LOC~~ PSKT
PREFILLED_SYRINGE | SUBCUTANEOUS | Status: AC
  Filled 2017-05-22: qty 0.6

## 2017-05-22 MED ORDER — SODIUM CHLORIDE 0.9 % IV SOLN
444.0000 mg | Freq: Once | INTRAVENOUS | Status: AC
  Administered 2017-05-22: 440 mg via INTRAVENOUS
  Filled 2017-05-22: qty 44

## 2017-05-22 MED ORDER — SODIUM CHLORIDE 0.9 % IV SOLN
Freq: Once | INTRAVENOUS | Status: AC
  Administered 2017-05-22: 11:00:00 via INTRAVENOUS

## 2017-05-22 MED ORDER — FAMOTIDINE IN NACL 20-0.9 MG/50ML-% IV SOLN: 20.0000 mg | Freq: Once | INTRAVENOUS | Status: DC

## 2017-05-22 MED ORDER — SODIUM CHLORIDE 0.9 % IV SOLN
200.0000 mg | Freq: Once | INTRAVENOUS | Status: AC
  Administered 2017-05-22: 200 mg via INTRAVENOUS
  Filled 2017-05-22: qty 8

## 2017-05-22 NOTE — Telephone Encounter (Signed)
Gave patient avs and calendar with appts per 3/12 los.

## 2017-05-22 NOTE — Patient Instructions (Signed)
[facility] Discharge Instructions for Patients Receiving Chemotherapy  Today you received the following chemotherapy agents taxol/carboplatin/keytruda   To help prevent nausea and vomiting after your treatment, we encourage you to take your nausea medication as directed  If you develop nausea and vomiting that is not controlled by your nausea medication, call the clinic.   BELOW ARE SYMPTOMS THAT SHOULD BE REPORTED IMMEDIATELY:  *FEVER GREATER THAN 100.5 F  *CHILLS WITH OR WITHOUT FEVER  NAUSEA AND VOMITING THAT IS NOT CONTROLLED WITH YOUR NAUSEA MEDICATION  *UNUSUAL SHORTNESS OF BREATH  *UNUSUAL BRUISING OR BLEEDING  TENDERNESS IN MOUTH AND THROAT WITH OR WITHOUT PRESENCE OF ULCERS  *URINARY PROBLEMS  *BOWEL PROBLEMS  UNUSUAL RASH Items with * indicate a potential emergency and should be followed up as soon as possible.  Feel free to call the clinic you have any questions or concerns. The clinic phone number is [phone].

## 2017-05-22 NOTE — Progress Notes (Signed)
No TSH needed today per Dr. Julien Nordmann.

## 2017-05-22 NOTE — Progress Notes (Signed)
[facility] [phone]) [phone]   [phone]) [phone]  OFFICE PROGRESS NOTE  [name], MD [address] [place] [address]  DIAGNOSIS: Metastatic non-small cell carcinoma (T3, N2, M1a) non-small cell lung cancer diagnosed in November 2018 and presented with large right suprahilar and mediastinal mass as well as recent right pleural effusion.  Guardant 360: No actionable mutations.  PRIOR THERAPY: Palliative radiotherapy to the large mediastinal mass under the care of Dr. Lisbeth Renshaw.  CURRENT THERAPY: Systemic chemotherapy with carboplatin for AUC of 5, paclitaxel 175 mg/M2 and Keytruda 200 mg IV every 3 weeks.  First dose expected May 01, 2017.  Status post 1 cycle.  INTERVAL HISTORY: Cassidy Sanders 77 y.o. female returns to the clinic today for follow-up visit accompanied by her daughter.  The patient is feeling fine today with no specific complaints.  She tolerated the first cycle of her treatment fairly well except for lack of sleep for a few days after the treatment secondary to steroid treatment.  She denied having any chest pain but continues to have shortness of breath at baseline increased with exertion with no cough or hemoptysis.  She had few episodes of nausea after her treatment but this was resolved with antiemetics.  She has no fever or chills.  She has no bleeding issues.  The patient is here today for evaluation before starting cycle #2.  MEDICAL HISTORY: Past Medical History:  Diagnosis Date  . Brain tumor (benign) ([place])   . Depression   . Hypertension     ALLERGIES:  is allergic to latex; gabapentin; and tape.  MEDICATIONS:  Current Outpatient Medications  Medication Sig Dispense Refill  . albuterol (PROVENTIL HFA;VENTOLIN HFA) 108 (90 Base) MCG/ACT inhaler Inhale 2 puffs into the lungs every 4 (four) hours as needed for wheezing or shortness of breath. 1 Inhaler 12  . Biotin 2.5 MG TABS Take 2,500 mcg by mouth daily.    .  chlorpheniramine-HYDROcodone (TUSSIONEX) 10-8 MG/5ML SUER Take 5 mLs by mouth every 12 (twelve) hours as needed for cough. (Patient not taking: Reported on 05/17/2017) 140 mL 0  . hydrochlorothiazide (MICROZIDE) 12.5 MG capsule Take 12.5 mg by mouth daily.  6  . Lactulose 20 GM/30ML SOLN Take 30 mLs (20 g total) by mouth 4 (four) times daily as needed. (Patient taking differently: Take 30 mLs by mouth 4 (four) times daily as needed (PRN for constipation). ) 236 mL 0  . metoprolol tartrate (LOPRESSOR) 50 MG tablet Take 0.5 tablets (25 mg total) by mouth 2 (two) times daily. (Patient taking differently: Take 50 mg by mouth 2 (two) times daily. ) 30 tablet 11  . Multiple Vitamins-Minerals (ADULT GUMMY) CHEW Chew 1 tablet by mouth daily.    . prochlorperazine (COMPAZINE) 10 MG tablet Take 1 tablet (10 mg total) by mouth every 6 (six) hours as needed for nausea or vomiting. 30 tablet 0  . sertraline (ZOLOFT) 50 MG tablet Take 50 mg by mouth daily.     No current facility-administered medications for this visit.     SURGICAL HISTORY:  Past Surgical History:  Procedure Laterality Date  . ABDOMINAL HYSTERECTOMY    . BLADDER SURGERY    . BRAIN SURGERY    . ENDOBRONCHIAL ULTRASOUND Bilateral 02/06/2017   Procedure: ENDOBRONCHIAL ULTRASOUND;  Surgeon: Juanito Doom, MD;  Location: [facility];  Service: Cardiopulmonary;  Laterality: Bilateral;  . VIDEO BRONCHOSCOPY WITH ENDOBRONCHIAL ULTRASOUND N/A 03/23/2017   Procedure: VIDEO BRONCHOSCOPY WITH ENDOBRONCHIAL ULTRASOUND;  Surgeon:  Juanito Doom, MD;  Location: [facility];  Service: Thoracic;  Laterality: N/A;    REVIEW OF SYSTEMS:  A comprehensive review of systems was negative except for: Constitutional: positive for fatigue Respiratory: positive for dyspnea on exertion   PHYSICAL EXAMINATION: General appearance: alert, cooperative, fatigued and no distress Head: Normocephalic, without obvious abnormality, atraumatic Neck: no adenopathy, no JVD,  supple, symmetrical, trachea midline and thyroid not enlarged, symmetric, no tenderness/mass/nodules Lymph nodes: Cervical, supraclavicular, and axillary nodes normal. Resp: wheezes bilaterally Back: symmetric, no curvature. ROM normal. No CVA tenderness. Cardio: regular rate and rhythm, S1, S2 normal, no murmur, click, rub or gallop GI: soft, non-tender; bowel sounds normal; no masses,  no organomegaly Extremities: extremities normal, atraumatic, no cyanosis or edema  ECOG PERFORMANCE STATUS: 1 - Symptomatic but completely ambulatory  Blood pressure 116/61, pulse 84, temperature 98.2 F (36.8 C), temperature source Oral, resp. rate (!) 24, height 5\' 9"  (1.753 m), weight 174 lb 12.8 oz (79.3 kg), SpO2 (!) 2 %.  LABORATORY DATA: Lab Results  Component Value Date   WBC 6.6 05/22/2017   HGB 11.7 (L) 05/17/2017   HCT 38.6 05/22/2017   MCV 87.5 05/22/2017   PLT 215 05/22/2017      Chemistry      Component Value Date/Time   NA 139 05/22/2017 0852   NA 143 03/15/2017 1436   K 3.2 (L) 05/22/2017 0852   K 3.7 03/15/2017 1436   CL 98 05/22/2017 0852   CO2 32 (H) 05/22/2017 0852   CO2 34 (H) 03/15/2017 1436   BUN 16 05/22/2017 0852   BUN 17.8 03/15/2017 1436   CREATININE 0.80 05/22/2017 0852   CREATININE 0.8 03/15/2017 1436      Component Value Date/Time   CALCIUM 9.2 05/22/2017 0852   CALCIUM 9.6 03/15/2017 1436   ALKPHOS 106 05/22/2017 0852   ALKPHOS 88 03/15/2017 1436   AST 14 05/22/2017 0852   AST 16 03/15/2017 1436   ALT 13 05/22/2017 0852   ALT 15 03/15/2017 1436   BILITOT 0.5 05/22/2017 0852   BILITOT 0.52 03/15/2017 1436       RADIOGRAPHIC STUDIES: Dg Chest 1 View  Result Date: 05/11/2017 CLINICAL DATA:  Status post right thoracentesis. EXAM: CHEST 1 VIEW COMPARISON:  PET-CT, 03/12/2017.  Chest radiograph, 02/10/2017. FINDINGS: No pneumothorax or evidence of a complication following thoracentesis. There is opacity at both lung bases obscuring hemidiaphragms  consistent with a combination of small effusions with atelectasis. Mild volume loss is noted on the right. There is soft tissue prominence overlying the right hilum consistent with the known right hilar mass. Lungs show prominent bronchovascular markings but no convincing edema. IMPRESSION: 1. No pneumothorax or evidence of a complication following thoracentesis. 2. Lung base opacity increased when compared to the prior chest radiographs likely a combination of small effusions and atelectasis. Electronically Signed   By: Lajean Manes M.D.   On: 05/11/2017 12:59   Dg Chest 2 View  Result Date: 05/08/2017 CLINICAL DATA:  77 year old female with history of chronic shortness of breath. EXAM: CHEST  2 VIEW COMPARISON:  No priors. FINDINGS: Large right and small to moderate left pleural effusions. Bibasilar opacities favored to predominantly reflect associated passive subsegmental atelectasis, although underlying airspace consolidation is not excluded. Cardiac silhouette is partially obscured but appears enlarged. Pulmonary vasculature is mildly engorged, without frank pulmonary edema. Aortic atherosclerosis. IMPRESSION: 1. Large right and small to moderate left pleural effusions with probable bibasilar atelectasis. 2. Cardiomegaly with pulmonary vascular congestion, but no  frank pulmonary edema. Electronically Signed   By: Vinnie Langton M.D.   On: 05/08/2017 20:28   Dg Chest Port 1 View  Result Date: 05/17/2017 CLINICAL DATA:  Shortness of for 2 days, lung cancer, received first chemotherapy round 2 weeks ago hypertension, former smoker EXAM: PORTABLE CHEST 1 VIEW COMPARISON:  Portable exam 1555 hours compared to 05/11/2017; correlation PET-CT 03/12/2017 FINDINGS: Enlargement of cardiac silhouette. Atherosclerotic calcification aorta. Abnormal soft tissue density RIGHT paratracheal associated with RIGHT hilar enlargement corresponding to known tumor on prior PET-CT. Associated volume loss in RIGHT upper lobe.  Basilar RIGHT pleural effusion and atelectasis. Question BILATERAL lower lobe infiltrates. Remaining LEFT lung clear. No pneumothorax. Bones unremarkable. IMPRESSION: Enlargement of cardiac silhouette with RIGHT pleural effusion and mild RIGHT basilar atelectasis. Questionable bibasilar infiltrates. 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 Electronically Signed   By: Lavonia Dana M.D.   On: 05/17/2017 16:21   US Thoracentesis Asp Pleural Space W/img Guide  Result Date: 05/11/2017 INDICATION: Non-small cell lung cancer. Right pleural effusion. Request for diagnostic and therapeutic thoracentesis. EXAM: ULTRASOUND GUIDED RIGHT THORACENTESIS MEDICATIONS: 1% Lidocaine = 10 mL COMPLICATIONS: None immediate. PROCEDURE: An ultrasound guided thoracentesis was thoroughly discussed with the patient and questions answered. The benefits, risks, alternatives and complications were also discussed. The patient understands and wishes to proceed with the procedure. Written consent was obtained. Ultrasound was performed to localize and mark an adequate pocket of fluid in the right chest. The area was then prepped and draped in the normal sterile fashion. 1% Lidocaine was used for local anesthesia. Under ultrasound guidance a 6 Fr Safe-T-Centesis catheter was introduced. Thoracentesis was performed. The catheter was removed and a dressing applied. FINDINGS: A total of approximately 1.3 liters of clear yellow fluid was removed. Samples were sent to the laboratory as requested by the clinical team. IMPRESSION: Successful ultrasound guided right thoracentesis yielding 1.3 liters of pleural fluid. No pneumothorax on post procedure chest X-ray. Read by: Gareth Eagle, PA-C Electronically Signed   By: Aletta Edouard M.D.   On: 05/11/2017 13:26    ASSESSMENT AND PLAN: This is a very pleasant 77 years old white female recently diagnosed with probably stage IV non-small cell lung cancer presented with large right  suprahilar lung mass in addition to mediastinal lymphadenopathy and suspicious malignant right pleural effusion.  Unfortunately the patient had bronchoscopy twice but there was insufficient material on the biopsy for further study to identify the subtype of her lung cancer.  The possibility of melanoma was not also excluded based on the most recent report but this is unlikely with the presentation and imaging studies. The patient underwent palliative radiotherapy to the right lung mass and mediastinal lymphadenopathy under the care of Dr. Lisbeth Renshaw.  She tolerated this treatment fairly well.  She developed right pleural effusion in the interval but she is not interested in proceeding with ultrasound-guided thoracentesis. The patient is currently undergoing systemic chemotherapy with carboplatin, paclitaxel and Keytruda status post 1 cycle.  She is tolerating this treatment fairly well. I recommended for her to proceed with cycle #2 today as a scheduled. I will see her back for follow-up visit in 3 weeks for evaluation before starting cycle #3. For the hypokalemia, I advised the patient to increase her potassium rich diet. She was advised to call immediately if she has any concerning symptoms in the interval. The patient voices understanding of current disease status and treatment options and is in agreement with the current care plan.  All questions were answered. The patient knows to call the clinic with any problems, questions or concerns. We can certainly see the patient much sooner if necessary.   Disclaimer: This note was dictated with voice recognition software. Similar sounding words can inadvertently be transcribed and may not be corrected upon review.

## 2017-05-29 ENCOUNTER — Inpatient Hospital Stay: Payer: Medicare Other

## 2017-05-29 DIAGNOSIS — C382 Malignant neoplasm of posterior mediastinum: Secondary | ICD-10-CM

## 2017-05-29 DIAGNOSIS — Z5112 Encounter for antineoplastic immunotherapy: Secondary | ICD-10-CM | POA: Diagnosis not present

## 2017-05-29 DIAGNOSIS — C778 Secondary and unspecified malignant neoplasm of lymph nodes of multiple regions: Secondary | ICD-10-CM | POA: Diagnosis not present

## 2017-05-29 DIAGNOSIS — R5382 Chronic fatigue, unspecified: Secondary | ICD-10-CM

## 2017-05-29 DIAGNOSIS — Z79899 Other long term (current) drug therapy: Secondary | ICD-10-CM | POA: Diagnosis not present

## 2017-05-29 DIAGNOSIS — C3491 Malignant neoplasm of unspecified part of right bronchus or lung: Secondary | ICD-10-CM | POA: Diagnosis not present

## 2017-05-29 DIAGNOSIS — Z5111 Encounter for antineoplastic chemotherapy: Secondary | ICD-10-CM | POA: Diagnosis not present

## 2017-05-29 LAB — CBC WITH DIFFERENTIAL (CANCER CENTER ONLY)
BASOS ABS: 0 10*3/uL (ref 0.0–0.1)
Basophils Relative: 1 %
Eosinophils Absolute: 0 10*3/uL (ref 0.0–0.5)
Eosinophils Relative: 2 %
HEMATOCRIT: 35.8 % (ref 34.8–46.6)
Hemoglobin: 11.2 g/dL — ABNORMAL LOW (ref 11.6–15.9)
LYMPHS PCT: 26 %
Lymphs Abs: 0.5 10*3/uL — ABNORMAL LOW (ref 0.9–3.3)
MCH: 26.8 pg (ref 25.1–34.0)
MCHC: 31.3 g/dL — ABNORMAL LOW (ref 31.5–36.0)
MCV: 85.6 fL (ref 79.5–101.0)
MONO ABS: 0.3 10*3/uL (ref 0.1–0.9)
Monocytes Relative: 18 %
NEUTROS ABS: 0.9 10*3/uL — AB (ref 1.5–6.5)
Neutrophils Relative %: 53 %
Platelet Count: 102 10*3/uL — ABNORMAL LOW (ref 145–400)
RBC: 4.18 MIL/uL (ref 3.70–5.45)
RDW: 15 % — ABNORMAL HIGH (ref 11.2–14.5)
WBC Count: 1.7 10*3/uL — ABNORMAL LOW (ref 3.9–10.3)

## 2017-05-29 LAB — CMP (CANCER CENTER ONLY)
ALT: 16 U/L (ref 0–55)
AST: 19 U/L (ref 5–34)
Albumin: 2.7 g/dL — ABNORMAL LOW (ref 3.5–5.0)
Alkaline Phosphatase: 115 U/L (ref 40–150)
Anion gap: 8 (ref 3–11)
BILIRUBIN TOTAL: 0.9 mg/dL (ref 0.2–1.2)
BUN: 17 mg/dL (ref 7–26)
CALCIUM: 9.5 mg/dL (ref 8.4–10.4)
CO2: 32 mmol/L — ABNORMAL HIGH (ref 22–29)
CREATININE: 0.79 mg/dL (ref 0.60–1.10)
Chloride: 96 mmol/L — ABNORMAL LOW (ref 98–109)
GFR, Est AFR Am: >60 mL/min (ref >60)
Glucose, Bld: 120 mg/dL (ref 70–140)
Potassium: 3.7 mmol/L (ref 3.5–5.1)
Sodium: 136 mmol/L (ref 136–145)
TOTAL PROTEIN: 6.9 g/dL (ref 6.4–8.3)

## 2017-05-29 LAB — TSH: TSH: 1.718 u[IU]/mL (ref 0.308–3.960)

## 2017-06-05 ENCOUNTER — Inpatient Hospital Stay: Payer: Medicare Other

## 2017-06-05 DIAGNOSIS — C3491 Malignant neoplasm of unspecified part of right bronchus or lung: Secondary | ICD-10-CM | POA: Diagnosis not present

## 2017-06-05 DIAGNOSIS — C382 Malignant neoplasm of posterior mediastinum: Secondary | ICD-10-CM

## 2017-06-05 DIAGNOSIS — Z5111 Encounter for antineoplastic chemotherapy: Secondary | ICD-10-CM | POA: Diagnosis not present

## 2017-06-05 DIAGNOSIS — Z5112 Encounter for antineoplastic immunotherapy: Secondary | ICD-10-CM | POA: Diagnosis not present

## 2017-06-05 DIAGNOSIS — Z79899 Other long term (current) drug therapy: Secondary | ICD-10-CM | POA: Diagnosis not present

## 2017-06-05 DIAGNOSIS — C778 Secondary and unspecified malignant neoplasm of lymph nodes of multiple regions: Secondary | ICD-10-CM | POA: Diagnosis not present

## 2017-06-05 LAB — CBC WITH DIFFERENTIAL (CANCER CENTER ONLY)
Basophils Absolute: 0 10*3/uL (ref 0.0–0.1)
Basophils Relative: 1 %
Eosinophils Absolute: 0.1 10*3/uL (ref 0.0–0.5)
Eosinophils Relative: 1 %
HEMATOCRIT: 32 % — AB (ref 34.8–46.6)
HEMOGLOBIN: 10.4 g/dL — AB (ref 11.6–15.9)
LYMPHS ABS: 0.6 10*3/uL — AB (ref 0.9–3.3)
Lymphocytes Relative: 11 %
MCH: 26.6 pg (ref 25.1–34.0)
MCHC: 32.6 g/dL (ref 31.5–36.0)
MCV: 81.8 fL (ref 79.5–101.0)
MONOS PCT: 10 %
Monocytes Absolute: 0.6 10*3/uL (ref 0.1–0.9)
NEUTROS ABS: 4.4 10*3/uL (ref 1.5–6.5)
NEUTROS PCT: 77 %
Platelet Count: 250 10*3/uL (ref 145–400)
RBC: 3.91 MIL/uL (ref 3.70–5.45)
RDW: 17 % — ABNORMAL HIGH (ref 11.2–14.5)
WBC Count: 5.7 10*3/uL (ref 3.9–10.3)

## 2017-06-05 LAB — CMP (CANCER CENTER ONLY)
ALK PHOS: 124 U/L (ref 40–150)
ALT: 15 U/L (ref 0–55)
ANION GAP: 9 (ref 3–11)
AST: 16 U/L (ref 5–34)
Albumin: 2.7 g/dL — ABNORMAL LOW (ref 3.5–5.0)
BILIRUBIN TOTAL: 0.4 mg/dL (ref 0.2–1.2)
BUN: 14 mg/dL (ref 7–26)
CALCIUM: 9.6 mg/dL (ref 8.4–10.4)
CO2: 33 mmol/L — ABNORMAL HIGH (ref 22–29)
Chloride: 98 mmol/L (ref 98–109)
Creatinine: 0.76 mg/dL (ref 0.60–1.10)
GFR, Estimated: >60 mL/min (ref >60)
GLUCOSE: 103 mg/dL (ref 70–140)
Potassium: 3.3 mmol/L — ABNORMAL LOW (ref 3.5–5.1)
Sodium: 140 mmol/L (ref 136–145)
TOTAL PROTEIN: 7 g/dL (ref 6.4–8.3)

## 2017-06-06 DIAGNOSIS — I119 Hypertensive heart disease without heart failure: Secondary | ICD-10-CM | POA: Diagnosis not present

## 2017-06-06 DIAGNOSIS — C3491 Malignant neoplasm of unspecified part of right bronchus or lung: Secondary | ICD-10-CM | POA: Diagnosis not present

## 2017-06-12 ENCOUNTER — Telehealth: Payer: Self-pay | Admitting: Internal Medicine

## 2017-06-12 ENCOUNTER — Encounter: Payer: Self-pay | Admitting: Internal Medicine

## 2017-06-12 ENCOUNTER — Inpatient Hospital Stay: Payer: Medicare Other

## 2017-06-12 ENCOUNTER — Inpatient Hospital Stay: Payer: Medicare Other | Attending: Internal Medicine | Admitting: Internal Medicine

## 2017-06-12 DIAGNOSIS — Z79899 Other long term (current) drug therapy: Secondary | ICD-10-CM | POA: Diagnosis not present

## 2017-06-12 DIAGNOSIS — Z5112 Encounter for antineoplastic immunotherapy: Secondary | ICD-10-CM | POA: Insufficient documentation

## 2017-06-12 DIAGNOSIS — Z5189 Encounter for other specified aftercare: Secondary | ICD-10-CM | POA: Diagnosis not present

## 2017-06-12 DIAGNOSIS — Z5111 Encounter for antineoplastic chemotherapy: Secondary | ICD-10-CM | POA: Insufficient documentation

## 2017-06-12 DIAGNOSIS — G620 Drug-induced polyneuropathy: Secondary | ICD-10-CM

## 2017-06-12 DIAGNOSIS — C778 Secondary and unspecified malignant neoplasm of lymph nodes of multiple regions: Secondary | ICD-10-CM | POA: Diagnosis not present

## 2017-06-12 DIAGNOSIS — C382 Malignant neoplasm of posterior mediastinum: Secondary | ICD-10-CM

## 2017-06-12 DIAGNOSIS — C3491 Malignant neoplasm of unspecified part of right bronchus or lung: Secondary | ICD-10-CM | POA: Insufficient documentation

## 2017-06-12 DIAGNOSIS — C3490 Malignant neoplasm of unspecified part of unspecified bronchus or lung: Secondary | ICD-10-CM

## 2017-06-12 LAB — CMP (CANCER CENTER ONLY)
ALT: 11 U/L (ref 0–55)
ANION GAP: 7 (ref 3–11)
AST: 13 U/L (ref 5–34)
Albumin: 2.5 g/dL — ABNORMAL LOW (ref 3.5–5.0)
Alkaline Phosphatase: 100 U/L (ref 40–150)
BUN: 13 mg/dL (ref 7–26)
CHLORIDE: 101 mmol/L (ref 98–109)
CO2: 31 mmol/L — ABNORMAL HIGH (ref 22–29)
Calcium: 9.3 mg/dL (ref 8.4–10.4)
Creatinine: 0.7 mg/dL (ref 0.60–1.10)
GFR, Est AFR Am: >60 mL/min (ref >60)
Glucose, Bld: 143 mg/dL — ABNORMAL HIGH (ref 70–140)
POTASSIUM: 3.8 mmol/L (ref 3.5–5.1)
Sodium: 139 mmol/L (ref 136–145)
Total Bilirubin: 0.4 mg/dL (ref 0.2–1.2)
Total Protein: 6.8 g/dL (ref 6.4–8.3)

## 2017-06-12 LAB — CBC WITH DIFFERENTIAL (CANCER CENTER ONLY)
BASOS ABS: 0 10*3/uL (ref 0.0–0.1)
Basophils Relative: 0 %
Eosinophils Absolute: 0 10*3/uL (ref 0.0–0.5)
Eosinophils Relative: 0 %
HEMATOCRIT: 32.1 % — AB (ref 34.8–46.6)
HEMOGLOBIN: 9.9 g/dL — AB (ref 11.6–15.9)
LYMPHS ABS: 0.5 10*3/uL — AB (ref 0.9–3.3)
Lymphocytes Relative: 7 %
MCH: 26.7 pg (ref 25.1–34.0)
MCHC: 30.8 g/dL — ABNORMAL LOW (ref 31.5–36.0)
MCV: 86.5 fL (ref 79.5–101.0)
Monocytes Absolute: 1.1 10*3/uL — ABNORMAL HIGH (ref 0.1–0.9)
Monocytes Relative: 13 %
NEUTROS ABS: 6.2 10*3/uL (ref 1.5–6.5)
Neutrophils Relative %: 80 %
Platelet Count: 286 10*3/uL (ref 145–400)
RBC: 3.71 MIL/uL (ref 3.70–5.45)
RDW: 16.5 % — ABNORMAL HIGH (ref 11.2–14.5)
WBC: 7.8 10*3/uL (ref 3.9–10.3)

## 2017-06-12 MED ORDER — FAMOTIDINE IN NACL 20-0.9 MG/50ML-% IV SOLN
20.0000 mg | Freq: Once | INTRAVENOUS | Status: AC
  Administered 2017-06-12: 20 mg via INTRAVENOUS

## 2017-06-12 MED ORDER — DIPHENHYDRAMINE HCL 50 MG/ML IJ SOLN
50.0000 mg | Freq: Once | INTRAMUSCULAR | Status: AC
  Administered 2017-06-12: 50 mg via INTRAVENOUS

## 2017-06-12 MED ORDER — SODIUM CHLORIDE 0.9 % IV SOLN
Freq: Once | INTRAVENOUS | Status: AC
  Administered 2017-06-12: 10:00:00 via INTRAVENOUS

## 2017-06-12 MED ORDER — SODIUM CHLORIDE 0.9 % IV SOLN
444.0000 mg | Freq: Once | INTRAVENOUS | Status: AC
  Administered 2017-06-12: 440 mg via INTRAVENOUS
  Filled 2017-06-12: qty 44

## 2017-06-12 MED ORDER — PEGFILGRASTIM 6 MG/0.6ML ~~LOC~~ PSKT
PREFILLED_SYRINGE | SUBCUTANEOUS | Status: AC
  Filled 2017-06-12: qty 0.6

## 2017-06-12 MED ORDER — PACLITAXEL CHEMO INJECTION 300 MG/50ML
175.0000 mg/m2 | Freq: Once | INTRAVENOUS | Status: AC
  Administered 2017-06-12: 360 mg via INTRAVENOUS
  Filled 2017-06-12: qty 60

## 2017-06-12 MED ORDER — FAMOTIDINE IN NACL 20-0.9 MG/50ML-% IV SOLN
INTRAVENOUS | Status: AC
Start: 2017-06-12 — End: 2017-06-12
  Filled 2017-06-12: qty 50

## 2017-06-12 MED ORDER — SODIUM CHLORIDE 0.9 % IV SOLN
200.0000 mg | Freq: Once | INTRAVENOUS | Status: AC
  Administered 2017-06-12: 200 mg via INTRAVENOUS
  Filled 2017-06-12: qty 8

## 2017-06-12 MED ORDER — DIPHENHYDRAMINE HCL 50 MG/ML IJ SOLN
INTRAMUSCULAR | Status: AC
  Filled 2017-06-12: qty 1

## 2017-06-12 MED ORDER — PALONOSETRON HCL INJECTION 0.25 MG/5ML
0.2500 mg | Freq: Once | INTRAVENOUS | Status: AC
  Administered 2017-06-12: 0.25 mg via INTRAVENOUS

## 2017-06-12 MED ORDER — SODIUM CHLORIDE 0.9 % IV SOLN
Freq: Once | INTRAVENOUS | Status: AC
  Administered 2017-06-12: 11:00:00 via INTRAVENOUS
  Filled 2017-06-12: qty 5

## 2017-06-12 MED ORDER — PEGFILGRASTIM 6 MG/0.6ML ~~LOC~~ PSKT
6.0000 mg | PREFILLED_SYRINGE | Freq: Once | SUBCUTANEOUS | Status: AC
  Administered 2017-06-12: 6 mg via SUBCUTANEOUS

## 2017-06-12 MED ORDER — PALONOSETRON HCL INJECTION 0.25 MG/5ML
INTRAVENOUS | Status: AC
  Filled 2017-06-12: qty 5

## 2017-06-12 NOTE — Progress Notes (Signed)
[facility] Telephone:(336) [phone]   Fax:(336) [phone]  OFFICE PROGRESS NOTE  [name], MD [address] [place] [address]  DIAGNOSIS: Metastatic non-small cell carcinoma (T3, N2, M1a) non-small cell lung cancer diagnosed in November 2018 and presented with large right suprahilar and mediastinal mass as well as recent right pleural effusion.  Guardant 360: No actionable mutations.  PRIOR THERAPY: Palliative radiotherapy to the large mediastinal mass under the care of Dr. Lisbeth Renshaw.  CURRENT THERAPY: Systemic chemotherapy with carboplatin for AUC of 5, paclitaxel 175 mg/M2 and Keytruda 200 mg IV every 3 weeks.  First dose expected May 01, 2017.  Status post 2 cycles.  INTERVAL HISTORY: Cassidy Sanders 77 y.o. female returns to the clinic today for follow-up visit accompanied by her daughter.  The patient is feeling fine today with no specific complaints except for mild fatigue and cough.  She denied having any chest pain, shortness of breath or hemoptysis.  She denied having any fever or chills.  She has no nausea, vomiting, diarrhea or constipation.  She continues to tolerate her treatment fairly well with no significant adverse effect except for mild peripheral neuropathy.  She is here today for evaluation before starting cycle #3.  MEDICAL HISTORY: Past Medical History:  Diagnosis Date  . Brain tumor (benign) ([facility])   . Depression   . Hypertension     ALLERGIES:  is allergic to latex; gabapentin; and tape.  MEDICATIONS:  Current Outpatient Medications  Medication Sig Dispense Refill  . albuterol (PROVENTIL HFA;VENTOLIN HFA) 108 (90 Base) MCG/ACT inhaler Inhale 2 puffs into the lungs every 4 (four) hours as needed for wheezing or shortness of breath. 1 Inhaler 12  . Biotin 2.5 MG TABS Take 2,500 mcg by mouth daily.    . chlorpheniramine-HYDROcodone (TUSSIONEX) 10-8 MG/5ML SUER Take 5 mLs by mouth every 12 (twelve) hours as needed for cough. 140 mL  0  . hydrochlorothiazide (MICROZIDE) 12.5 MG capsule Take 12.5 mg by mouth daily.  6  . Lactulose 20 GM/30ML SOLN Take 30 mLs (20 g total) by mouth 4 (four) times daily as needed. (Patient taking differently: Take 30 mLs by mouth 4 (four) times daily as needed (PRN for constipation). ) 236 mL 0  . metoprolol tartrate (LOPRESSOR) 50 MG tablet Take 0.5 tablets (25 mg total) by mouth 2 (two) times daily. (Patient taking differently: Take 50 mg by mouth 2 (two) times daily. ) 30 tablet 11  . Multiple Vitamins-Minerals (ADULT GUMMY) CHEW Chew 1 tablet by mouth daily.    . prochlorperazine (COMPAZINE) 10 MG tablet Take 1 tablet (10 mg total) by mouth every 6 (six) hours as needed for nausea or vomiting. 30 tablet 0  . sertraline (ZOLOFT) 50 MG tablet Take 50 mg by mouth daily.     No current facility-administered medications for this visit.     SURGICAL HISTORY:  Past Surgical History:  Procedure Laterality Date  . ABDOMINAL HYSTERECTOMY    . BLADDER SURGERY    . BRAIN SURGERY    . ENDOBRONCHIAL ULTRASOUND Bilateral 02/06/2017   Procedure: ENDOBRONCHIAL ULTRASOUND;  Surgeon: Juanito Doom, MD;  Location: [facility];  Service: Cardiopulmonary;  Laterality: Bilateral;  . VIDEO BRONCHOSCOPY WITH ENDOBRONCHIAL ULTRASOUND N/A 03/23/2017   Procedure: VIDEO BRONCHOSCOPY WITH ENDOBRONCHIAL ULTRASOUND;  Surgeon: Juanito Doom, MD;  Location: [facility];  Service: Thoracic;  Laterality: N/A;    REVIEW OF SYSTEMS:  A comprehensive review of systems was negative except for: Constitutional: positive for  fatigue Respiratory: positive for cough   PHYSICAL EXAMINATION: General appearance: alert, cooperative, fatigued and no distress Head: Normocephalic, without obvious abnormality, atraumatic Neck: no adenopathy, no JVD, supple, symmetrical, trachea midline and thyroid not enlarged, symmetric, no tenderness/mass/nodules Lymph nodes: Cervical, supraclavicular, and axillary nodes normal. Resp: wheezes  bilaterally Back: symmetric, no curvature. ROM normal. No CVA tenderness. Cardio: regular rate and rhythm, S1, S2 normal, no murmur, click, rub or gallop GI: soft, non-tender; bowel sounds normal; no masses,  no organomegaly Extremities: extremities normal, atraumatic, no cyanosis or edema  ECOG PERFORMANCE STATUS: 1 - Symptomatic but completely ambulatory  Blood pressure (!) 94/51, pulse 85, temperature 97.9 F (36.6 C), temperature source Oral, resp. rate 17, height 5\' 9"  (1.753 m), weight 171 lb 3.2 oz (77.7 kg), SpO2 96 %.  LABORATORY DATA: Lab Results  Component Value Date   WBC 7.8 06/12/2017   HGB 11.7 (L) 05/17/2017   HCT 32.1 (L) 06/12/2017   MCV 86.5 06/12/2017   PLT 286 06/12/2017      Chemistry      Component Value Date/Time   NA 140 06/05/2017 1106   NA 143 03/15/2017 1436   K 3.3 (L) 06/05/2017 1106   K 3.7 03/15/2017 1436   CL 98 06/05/2017 1106   CO2 33 (H) 06/05/2017 1106   CO2 34 (H) 03/15/2017 1436   BUN 14 06/05/2017 1106   BUN 17.8 03/15/2017 1436   CREATININE 0.76 06/05/2017 1106   CREATININE 0.8 03/15/2017 1436      Component Value Date/Time   CALCIUM 9.6 06/05/2017 1106   CALCIUM 9.6 03/15/2017 1436   ALKPHOS 124 06/05/2017 1106   ALKPHOS 88 03/15/2017 1436   AST 16 06/05/2017 1106   AST 16 03/15/2017 1436   ALT 15 06/05/2017 1106   ALT 15 03/15/2017 1436   BILITOT 0.4 06/05/2017 1106   BILITOT 0.52 03/15/2017 1436       RADIOGRAPHIC STUDIES: Dg Chest Port 1 View  Result Date: 05/17/2017 CLINICAL DATA:  Shortness of for 2 days, lung cancer, received first chemotherapy round 2 weeks ago hypertension, former smoker EXAM: PORTABLE CHEST 1 VIEW COMPARISON:  Portable exam 1555 hours compared to 05/11/2017; correlation PET-CT 03/12/2017 FINDINGS: Enlargement of cardiac silhouette. Atherosclerotic calcification aorta. Abnormal soft tissue density RIGHT paratracheal associated with RIGHT hilar enlargement corresponding to known tumor on prior  PET-CT. Associated volume loss in RIGHT upper lobe. Basilar RIGHT pleural effusion and atelectasis. Question BILATERAL lower lobe infiltrates. Remaining LEFT lung clear. No pneumothorax. Bones unremarkable. IMPRESSION: Enlargement of cardiac silhouette with RIGHT pleural effusion and mild RIGHT basilar atelectasis. Questionable bibasilar infiltrates. 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 Electronically Signed   By: Lavonia Dana M.D.   On: 05/17/2017 16:21    ASSESSMENT AND PLAN: This is a very pleasant 77 years old white female recently diagnosed with probably stage IV non-small cell lung cancer presented with large right suprahilar lung mass in addition to mediastinal lymphadenopathy and suspicious malignant right pleural effusion.  The patient is currently undergoing systemic chemotherapy with carboplatin, paclitaxel and Keytruda status post 2 cycles.  She is tolerating this treatment well with no concerning complaints. I recommended for her to proceed with cycle #3 today as a scheduled. I will see her back for follow-up visit in 3 weeks for evaluation after repeating CT scan of the chest for restaging of her disease. She was advised to call immediately if she has any concerning symptoms in the interval. The patient voices understanding  of current disease status and treatment options and is in agreement with the current care plan.  All questions were answered. The patient knows to call the clinic with any problems, questions or concerns. We can certainly see the patient much sooner if necessary.   Disclaimer: This note was dictated with voice recognition software. Similar sounding words can inadvertently be transcribed and may not be corrected upon review.

## 2017-06-12 NOTE — Patient Instructions (Signed)
[facility] Discharge Instructions for Patients Receiving Chemotherapy  Today you received the following chemotherapy agents: Pembrolizumab Beryle Flock), Paclitaxel (Taxol), and Carboplatin (Paraplatin).  To help prevent nausea and vomiting after your treatment, we encourage you to take your nausea medication as prescribed. Received Aloxi during treatment today-->take Compazine (not Zofran) for the next 3 days as needed.  If you develop nausea and vomiting that is not controlled by your nausea medication, call the clinic.   BELOW ARE SYMPTOMS THAT SHOULD BE REPORTED IMMEDIATELY:  *FEVER GREATER THAN 100.5 F  *CHILLS WITH OR WITHOUT FEVER  NAUSEA AND VOMITING THAT IS NOT CONTROLLED WITH YOUR NAUSEA MEDICATION  *UNUSUAL SHORTNESS OF BREATH  *UNUSUAL BRUISING OR BLEEDING  TENDERNESS IN MOUTH AND THROAT WITH OR WITHOUT PRESENCE OF ULCERS  *URINARY PROBLEMS  *BOWEL PROBLEMS  UNUSUAL RASH Items with * indicate a potential emergency and should be followed up as soon as possible.  Feel free to call the clinic should you have any questions or concerns. The clinic phone number is [phone].  Please show the [facility] at check-in to the Emergency Department and triage nurse.

## 2017-06-12 NOTE — Telephone Encounter (Signed)
Appointments scheduled AVS/Calendar printed per 4/2 los

## 2017-06-19 ENCOUNTER — Inpatient Hospital Stay: Payer: Medicare Other

## 2017-06-19 DIAGNOSIS — Z5112 Encounter for antineoplastic immunotherapy: Secondary | ICD-10-CM | POA: Diagnosis not present

## 2017-06-19 DIAGNOSIS — Z5189 Encounter for other specified aftercare: Secondary | ICD-10-CM | POA: Diagnosis not present

## 2017-06-19 DIAGNOSIS — C382 Malignant neoplasm of posterior mediastinum: Secondary | ICD-10-CM

## 2017-06-19 DIAGNOSIS — C3491 Malignant neoplasm of unspecified part of right bronchus or lung: Secondary | ICD-10-CM | POA: Diagnosis not present

## 2017-06-19 DIAGNOSIS — C778 Secondary and unspecified malignant neoplasm of lymph nodes of multiple regions: Secondary | ICD-10-CM | POA: Diagnosis not present

## 2017-06-19 DIAGNOSIS — R5382 Chronic fatigue, unspecified: Secondary | ICD-10-CM

## 2017-06-19 DIAGNOSIS — Z5111 Encounter for antineoplastic chemotherapy: Secondary | ICD-10-CM | POA: Diagnosis not present

## 2017-06-19 DIAGNOSIS — Z79899 Other long term (current) drug therapy: Secondary | ICD-10-CM | POA: Diagnosis not present

## 2017-06-19 LAB — CMP (CANCER CENTER ONLY)
ALK PHOS: 93 U/L (ref 40–150)
ALT: 18 U/L (ref 0–55)
ANION GAP: 7 (ref 3–11)
AST: 17 U/L (ref 5–34)
Albumin: 2.8 g/dL — ABNORMAL LOW (ref 3.5–5.0)
BUN: 14 mg/dL (ref 7–26)
CALCIUM: 9.4 mg/dL (ref 8.4–10.4)
CO2: 31 mmol/L — AB (ref 22–29)
CREATININE: 0.7 mg/dL (ref 0.60–1.10)
Chloride: 104 mmol/L (ref 98–109)
GFR, Est AFR Am: >60 mL/min (ref >60)
Glucose, Bld: 114 mg/dL (ref 70–140)
Potassium: 4.3 mmol/L (ref 3.5–5.1)
SODIUM: 142 mmol/L (ref 136–145)
Total Bilirubin: 0.5 mg/dL (ref 0.2–1.2)
Total Protein: 6.7 g/dL (ref 6.4–8.3)

## 2017-06-19 LAB — CBC WITH DIFFERENTIAL (CANCER CENTER ONLY)
BASOS ABS: 0 10*3/uL (ref 0.0–0.1)
BASOS PCT: 1 %
EOS ABS: 0 10*3/uL (ref 0.0–0.5)
Eosinophils Relative: 1 %
HEMATOCRIT: 28.9 % — AB (ref 34.8–46.6)
HEMOGLOBIN: 9.4 g/dL — AB (ref 11.6–15.9)
Lymphocytes Relative: 17 %
Lymphs Abs: 0.4 10*3/uL — ABNORMAL LOW (ref 0.9–3.3)
MCH: 27 pg (ref 25.1–34.0)
MCHC: 32.5 g/dL (ref 31.5–36.0)
MCV: 82.9 fL (ref 79.5–101.0)
Monocytes Absolute: 0.1 10*3/uL (ref 0.1–0.9)
Monocytes Relative: 4 %
NEUTROS ABS: 1.8 10*3/uL (ref 1.5–6.5)
Neutrophils Relative %: 77 %
Platelet Count: 198 10*3/uL (ref 145–400)
RBC: 3.48 MIL/uL — AB (ref 3.70–5.45)
RDW: 17.7 % — ABNORMAL HIGH (ref 11.2–14.5)
WBC: 2.4 10*3/uL — AB (ref 3.9–10.3)

## 2017-06-19 LAB — TSH: TSH: 2.233 u[IU]/mL (ref 0.308–3.960)

## 2017-06-25 ENCOUNTER — Inpatient Hospital Stay (HOSPITAL_COMMUNITY)
Admission: EM | Admit: 2017-06-25 | Discharge: 2017-07-06 | DRG: 371 | Disposition: A | Discharge status: Skilled Nursing Facility | Payer: Medicare Other | Attending: Internal Medicine | Admitting: Internal Medicine

## 2017-06-25 ENCOUNTER — Encounter (HOSPITAL_COMMUNITY): Payer: Self-pay | Admitting: Family Medicine

## 2017-06-25 DIAGNOSIS — E860 Dehydration: Secondary | ICD-10-CM | POA: Diagnosis not present

## 2017-06-25 DIAGNOSIS — I10 Essential (primary) hypertension: Secondary | ICD-10-CM | POA: Diagnosis present

## 2017-06-25 DIAGNOSIS — E440 Moderate protein-calorie malnutrition: Secondary | ICD-10-CM

## 2017-06-25 DIAGNOSIS — F419 Anxiety disorder, unspecified: Secondary | ICD-10-CM | POA: Diagnosis present

## 2017-06-25 DIAGNOSIS — I480 Paroxysmal atrial fibrillation: Secondary | ICD-10-CM | POA: Diagnosis present

## 2017-06-25 DIAGNOSIS — Z90710 Acquired absence of both cervix and uterus: Secondary | ICD-10-CM

## 2017-06-25 DIAGNOSIS — J9621 Acute and chronic respiratory failure with hypoxia: Secondary | ICD-10-CM | POA: Diagnosis present

## 2017-06-25 DIAGNOSIS — R6883 Chills (without fever): Secondary | ICD-10-CM | POA: Diagnosis not present

## 2017-06-25 DIAGNOSIS — Z79899 Other long term (current) drug therapy: Secondary | ICD-10-CM

## 2017-06-25 DIAGNOSIS — I4891 Unspecified atrial fibrillation: Secondary | ICD-10-CM

## 2017-06-25 DIAGNOSIS — Z86011 Personal history of benign neoplasm of the brain: Secondary | ICD-10-CM

## 2017-06-25 DIAGNOSIS — Z91040 Latex allergy status: Secondary | ICD-10-CM

## 2017-06-25 DIAGNOSIS — Z923 Personal history of irradiation: Secondary | ICD-10-CM

## 2017-06-25 DIAGNOSIS — I2510 Atherosclerotic heart disease of native coronary artery without angina pectoris: Secondary | ICD-10-CM | POA: Diagnosis present

## 2017-06-25 DIAGNOSIS — J90 Pleural effusion, not elsewhere classified: Secondary | ICD-10-CM

## 2017-06-25 DIAGNOSIS — A0472 Enterocolitis due to Clostridium difficile, not specified as recurrent: Secondary | ICD-10-CM | POA: Diagnosis not present

## 2017-06-25 DIAGNOSIS — Z66 Do not resuscitate: Secondary | ICD-10-CM | POA: Diagnosis present

## 2017-06-25 DIAGNOSIS — J910 Malignant pleural effusion: Secondary | ICD-10-CM | POA: Diagnosis present

## 2017-06-25 DIAGNOSIS — E876 Hypokalemia: Secondary | ICD-10-CM

## 2017-06-25 DIAGNOSIS — J9690 Respiratory failure, unspecified, unspecified whether with hypoxia or hypercapnia: Secondary | ICD-10-CM

## 2017-06-25 DIAGNOSIS — D638 Anemia in other chronic diseases classified elsewhere: Secondary | ICD-10-CM | POA: Diagnosis present

## 2017-06-25 DIAGNOSIS — R5381 Other malaise: Secondary | ICD-10-CM | POA: Diagnosis present

## 2017-06-25 DIAGNOSIS — R404 Transient alteration of awareness: Secondary | ICD-10-CM | POA: Diagnosis not present

## 2017-06-25 DIAGNOSIS — R197 Diarrhea, unspecified: Secondary | ICD-10-CM | POA: Diagnosis not present

## 2017-06-25 DIAGNOSIS — Z87891 Personal history of nicotine dependence: Secondary | ICD-10-CM

## 2017-06-25 DIAGNOSIS — J9801 Acute bronchospasm: Secondary | ICD-10-CM

## 2017-06-25 DIAGNOSIS — C382 Malignant neoplasm of posterior mediastinum: Secondary | ICD-10-CM | POA: Diagnosis not present

## 2017-06-25 DIAGNOSIS — R0600 Dyspnea, unspecified: Secondary | ICD-10-CM

## 2017-06-25 DIAGNOSIS — R42 Dizziness and giddiness: Secondary | ICD-10-CM | POA: Diagnosis not present

## 2017-06-25 DIAGNOSIS — D702 Other drug-induced agranulocytosis: Secondary | ICD-10-CM | POA: Diagnosis present

## 2017-06-25 DIAGNOSIS — C3490 Malignant neoplasm of unspecified part of unspecified bronchus or lung: Secondary | ICD-10-CM | POA: Diagnosis not present

## 2017-06-25 DIAGNOSIS — I959 Hypotension, unspecified: Secondary | ICD-10-CM

## 2017-06-25 DIAGNOSIS — Z9221 Personal history of antineoplastic chemotherapy: Secondary | ICD-10-CM

## 2017-06-25 DIAGNOSIS — Z888 Allergy status to other drugs, medicaments and biological substances status: Secondary | ICD-10-CM

## 2017-06-25 MED ORDER — SODIUM CHLORIDE 0.9 % IV BOLUS
1000.0000 mL | Freq: Once | INTRAVENOUS | Status: AC
Start: 2017-06-26 — End: 2017-06-26
  Administered 2017-06-26: 1000 mL via INTRAVENOUS

## 2017-06-25 NOTE — ED Notes (Signed)
Bed: NT61 Expected date:  Expected time:  Means of arrival:  Comments: Lung Ca/diarrhea

## 2017-06-25 NOTE — ED Triage Notes (Signed)
Per EMS, patient from [facility], c/o diarrhea x24 hours. C/o weakness and dizziness with ambulation. Hx lung cancer.   20g L AC 500 ml NS  115/89 BP 100 HR

## 2017-06-26 ENCOUNTER — Other Ambulatory Visit: Payer: Medicare Other

## 2017-06-26 ENCOUNTER — Telehealth: Payer: Self-pay | Admitting: Internal Medicine

## 2017-06-26 ENCOUNTER — Encounter (HOSPITAL_COMMUNITY): Payer: Self-pay | Admitting: Internal Medicine

## 2017-06-26 ENCOUNTER — Other Ambulatory Visit: Payer: Self-pay

## 2017-06-26 ENCOUNTER — Ambulatory Visit (HOSPITAL_COMMUNITY): Payer: Medicare Other

## 2017-06-26 DIAGNOSIS — R197 Diarrhea, unspecified: Secondary | ICD-10-CM | POA: Diagnosis present

## 2017-06-26 DIAGNOSIS — J90 Pleural effusion, not elsewhere classified: Secondary | ICD-10-CM | POA: Diagnosis not present

## 2017-06-26 DIAGNOSIS — Z90710 Acquired absence of both cervix and uterus: Secondary | ICD-10-CM | POA: Diagnosis not present

## 2017-06-26 DIAGNOSIS — E860 Dehydration: Secondary | ICD-10-CM | POA: Diagnosis not present

## 2017-06-26 DIAGNOSIS — I480 Paroxysmal atrial fibrillation: Secondary | ICD-10-CM | POA: Diagnosis not present

## 2017-06-26 DIAGNOSIS — A09 Infectious gastroenteritis and colitis, unspecified: Secondary | ICD-10-CM | POA: Diagnosis not present

## 2017-06-26 DIAGNOSIS — Z79899 Other long term (current) drug therapy: Secondary | ICD-10-CM | POA: Diagnosis not present

## 2017-06-26 DIAGNOSIS — R0602 Shortness of breath: Secondary | ICD-10-CM | POA: Diagnosis not present

## 2017-06-26 DIAGNOSIS — I959 Hypotension, unspecified: Secondary | ICD-10-CM | POA: Diagnosis not present

## 2017-06-26 DIAGNOSIS — R918 Other nonspecific abnormal finding of lung field: Secondary | ICD-10-CM

## 2017-06-26 DIAGNOSIS — C382 Malignant neoplasm of posterior mediastinum: Secondary | ICD-10-CM | POA: Diagnosis not present

## 2017-06-26 DIAGNOSIS — D638 Anemia in other chronic diseases classified elsewhere: Secondary | ICD-10-CM | POA: Diagnosis not present

## 2017-06-26 DIAGNOSIS — I952 Hypotension due to drugs: Secondary | ICD-10-CM | POA: Diagnosis not present

## 2017-06-26 DIAGNOSIS — C3490 Malignant neoplasm of unspecified part of unspecified bronchus or lung: Secondary | ICD-10-CM | POA: Diagnosis not present

## 2017-06-26 DIAGNOSIS — C781 Secondary malignant neoplasm of mediastinum: Secondary | ICD-10-CM | POA: Diagnosis not present

## 2017-06-26 DIAGNOSIS — Z87891 Personal history of nicotine dependence: Secondary | ICD-10-CM | POA: Diagnosis not present

## 2017-06-26 DIAGNOSIS — Z86011 Personal history of benign neoplasm of the brain: Secondary | ICD-10-CM | POA: Diagnosis not present

## 2017-06-26 DIAGNOSIS — A0472 Enterocolitis due to Clostridium difficile, not specified as recurrent: Secondary | ICD-10-CM | POA: Diagnosis not present

## 2017-06-26 DIAGNOSIS — J9690 Respiratory failure, unspecified, unspecified whether with hypoxia or hypercapnia: Secondary | ICD-10-CM | POA: Diagnosis not present

## 2017-06-26 DIAGNOSIS — E876 Hypokalemia: Secondary | ICD-10-CM | POA: Diagnosis not present

## 2017-06-26 DIAGNOSIS — R0689 Other abnormalities of breathing: Secondary | ICD-10-CM | POA: Diagnosis not present

## 2017-06-26 DIAGNOSIS — Z923 Personal history of irradiation: Secondary | ICD-10-CM | POA: Diagnosis not present

## 2017-06-26 DIAGNOSIS — I951 Orthostatic hypotension: Secondary | ICD-10-CM | POA: Diagnosis not present

## 2017-06-26 DIAGNOSIS — J9621 Acute and chronic respiratory failure with hypoxia: Secondary | ICD-10-CM | POA: Diagnosis not present

## 2017-06-26 DIAGNOSIS — F419 Anxiety disorder, unspecified: Secondary | ICD-10-CM | POA: Diagnosis present

## 2017-06-26 DIAGNOSIS — Z9221 Personal history of antineoplastic chemotherapy: Secondary | ICD-10-CM | POA: Diagnosis not present

## 2017-06-26 DIAGNOSIS — I2510 Atherosclerotic heart disease of native coronary artery without angina pectoris: Secondary | ICD-10-CM | POA: Diagnosis present

## 2017-06-26 DIAGNOSIS — C3401 Malignant neoplasm of right main bronchus: Secondary | ICD-10-CM | POA: Diagnosis not present

## 2017-06-26 DIAGNOSIS — I10 Essential (primary) hypertension: Secondary | ICD-10-CM | POA: Diagnosis not present

## 2017-06-26 DIAGNOSIS — C801 Malignant (primary) neoplasm, unspecified: Secondary | ICD-10-CM | POA: Diagnosis not present

## 2017-06-26 DIAGNOSIS — J910 Malignant pleural effusion: Secondary | ICD-10-CM | POA: Diagnosis not present

## 2017-06-26 DIAGNOSIS — D702 Other drug-induced agranulocytosis: Secondary | ICD-10-CM | POA: Diagnosis not present

## 2017-06-26 DIAGNOSIS — J9801 Acute bronchospasm: Secondary | ICD-10-CM | POA: Diagnosis not present

## 2017-06-26 DIAGNOSIS — R846 Abnormal cytological findings in specimens from respiratory organs and thorax: Secondary | ICD-10-CM | POA: Diagnosis not present

## 2017-06-26 DIAGNOSIS — I4891 Unspecified atrial fibrillation: Secondary | ICD-10-CM | POA: Diagnosis not present

## 2017-06-26 DIAGNOSIS — Z66 Do not resuscitate: Secondary | ICD-10-CM | POA: Diagnosis not present

## 2017-06-26 DIAGNOSIS — Z9981 Dependence on supplemental oxygen: Secondary | ICD-10-CM | POA: Diagnosis not present

## 2017-06-26 DIAGNOSIS — E440 Moderate protein-calorie malnutrition: Secondary | ICD-10-CM | POA: Diagnosis not present

## 2017-06-26 DIAGNOSIS — Z888 Allergy status to other drugs, medicaments and biological substances status: Secondary | ICD-10-CM | POA: Diagnosis not present

## 2017-06-26 DIAGNOSIS — R5381 Other malaise: Secondary | ICD-10-CM | POA: Diagnosis not present

## 2017-06-26 LAB — COMPREHENSIVE METABOLIC PANEL
ALBUMIN: 2.6 g/dL — AB (ref 3.5–5.0)
ALBUMIN: 2.7 g/dL — AB (ref 3.5–5.0)
ALT: 16 U/L (ref 14–54)
ALT: 18 U/L (ref 14–54)
ANION GAP: 8 (ref 5–15)
ANION GAP: 9 (ref 5–15)
AST: 21 U/L (ref 15–41)
AST: 27 U/L (ref 15–41)
Alkaline Phosphatase: 76 U/L (ref 38–126)
Alkaline Phosphatase: 81 U/L (ref 38–126)
BILIRUBIN TOTAL: 0.7 mg/dL (ref 0.3–1.2)
BILIRUBIN TOTAL: 0.7 mg/dL (ref 0.3–1.2)
BUN: 18 mg/dL (ref 6–20)
BUN: 13 mg/dL (ref 6–20)
CHLORIDE: 104 mmol/L (ref 101–111)
CO2: 24 mmol/L (ref 22–32)
CO2: 25 mmol/L (ref 22–32)
Calcium: 8.2 mg/dL — ABNORMAL LOW (ref 8.9–10.3)
Calcium: 8.1 mg/dL — ABNORMAL LOW (ref 8.9–10.3)
Chloride: 106 mmol/L (ref 101–111)
Creatinine, Ser: 0.61 mg/dL (ref 0.44–1.00)
Creatinine, Ser: 0.57 mg/dL (ref 0.44–1.00)
GFR calc Af Amer: >60 mL/min (ref >60)
GFR calc Af Amer: >60 mL/min (ref >60)
GFR calc non Af Amer: >60 mL/min (ref >60)
GLUCOSE: 89 mg/dL (ref 65–99)
Glucose, Bld: 101 mg/dL — ABNORMAL HIGH (ref 65–99)
POTASSIUM: 3.3 mmol/L — AB (ref 3.5–5.1)
Potassium: 3.6 mmol/L (ref 3.5–5.1)
SODIUM: 138 mmol/L (ref 135–145)
Sodium: 138 mmol/L (ref 135–145)
TOTAL PROTEIN: 5.9 g/dL — AB (ref 6.5–8.1)
TOTAL PROTEIN: 6.3 g/dL — AB (ref 6.5–8.1)

## 2017-06-26 LAB — CBC WITH DIFFERENTIAL/PLATELET
BAND NEUTROPHILS: 11 %
BLASTS: 0 %
Basophils Absolute: 0 10*3/uL (ref 0.0–0.1)
Basophils Relative: 0 %
EOS ABS: 0 10*3/uL (ref 0.0–0.7)
Eosinophils Relative: 0 %
HEMATOCRIT: 29.9 % — AB (ref 36.0–46.0)
Hemoglobin: 9.1 g/dL — ABNORMAL LOW (ref 12.0–15.0)
Lymphocytes Relative: 32 %
Lymphs Abs: 0.5 10*3/uL — ABNORMAL LOW (ref 0.7–4.0)
MCH: 25.8 pg — ABNORMAL LOW (ref 26.0–34.0)
MCHC: 30.4 g/dL (ref 30.0–36.0)
MCV: 84.7 fL (ref 78.0–100.0)
MONOS PCT: 14 %
Metamyelocytes Relative: 4 %
Monocytes Absolute: 0.2 10*3/uL (ref 0.1–1.0)
Myelocytes: 0 %
NEUTROS ABS: 0.8 10*3/uL — AB (ref 1.7–7.7)
Neutrophils Relative %: 39 %
Other: 0 %
Platelets: 182 10*3/uL (ref 150–400)
Promyelocytes Relative: 0 %
RBC: 3.53 MIL/uL — AB (ref 3.87–5.11)
RDW: 18.4 % — AB (ref 11.5–15.5)
WBC: 1.5 10*3/uL — ABNORMAL LOW (ref 4.0–10.5)
nRBC: 0 /100 WBC

## 2017-06-26 LAB — CBC
HEMATOCRIT: 30.7 % — AB (ref 36.0–46.0)
HEMOGLOBIN: 9.5 g/dL — AB (ref 12.0–15.0)
MCH: 26.5 pg (ref 26.0–34.0)
MCHC: 30.9 g/dL (ref 30.0–36.0)
MCV: 85.5 fL (ref 78.0–100.0)
Platelets: 208 10*3/uL (ref 150–400)
RBC: 3.59 MIL/uL — ABNORMAL LOW (ref 3.87–5.11)
RDW: 18.1 % — ABNORMAL HIGH (ref 11.5–15.5)
WBC: 2.6 10*3/uL — AB (ref 4.0–10.5)

## 2017-06-26 LAB — TSH: TSH: 2.852 u[IU]/mL (ref 0.350–4.500)

## 2017-06-26 MED ORDER — METRONIDAZOLE IN NACL 5-0.79 MG/ML-% IV SOLN
500.0000 mg | Freq: Three times a day (TID) | INTRAVENOUS | Status: DC
  Administered 2017-06-26 – 2017-06-28 (×6): 500 mg via INTRAVENOUS
  Filled 2017-06-26 (×6): qty 100

## 2017-06-26 MED ORDER — SODIUM CHLORIDE 0.9 % IV BOLUS
1000.0000 mL | Freq: Once | INTRAVENOUS | Status: AC
  Administered 2017-06-26: 1000 mL via INTRAVENOUS

## 2017-06-26 MED ORDER — LACTATED RINGERS IV SOLN
INTRAVENOUS | Status: AC
  Administered 2017-06-26: 13:00:00 via INTRAVENOUS

## 2017-06-26 MED ORDER — ACETAMINOPHEN 650 MG RE SUPP: 650.0000 mg | Freq: Four times a day (QID) | RECTAL | Status: DC | PRN

## 2017-06-26 MED ORDER — SERTRALINE HCL 50 MG PO TABS
50.0000 mg | ORAL_TABLET | Freq: Every day | ORAL | Status: DC
  Administered 2017-06-26 – 2017-07-06 (×10): 50 mg via ORAL
  Filled 2017-06-26 (×10): qty 1

## 2017-06-26 MED ORDER — ACETAMINOPHEN 325 MG PO TABS: 650.0000 mg | ORAL_TABLET | Freq: Four times a day (QID) | ORAL | Status: DC | PRN

## 2017-06-26 MED ORDER — ENOXAPARIN SODIUM 40 MG/0.4ML ~~LOC~~ SOLN
40.0000 mg | SUBCUTANEOUS | Status: DC
  Administered 2017-06-26 – 2017-06-28 (×3): 40 mg via SUBCUTANEOUS
  Filled 2017-06-26 (×3): qty 0.4

## 2017-06-26 MED ORDER — METRONIDAZOLE IN NACL 5-0.79 MG/ML-% IV SOLN: 500.0000 mg | Freq: Three times a day (TID) | INTRAVENOUS | Status: DC

## 2017-06-26 MED ORDER — POTASSIUM CHLORIDE CRYS ER 10 MEQ PO TBCR
40.0000 meq | EXTENDED_RELEASE_TABLET | Freq: Four times a day (QID) | ORAL | Status: AC
  Administered 2017-06-26 (×2): 40 meq via ORAL
  Filled 2017-06-26: qty 4
  Filled 2017-06-26: qty 2

## 2017-06-26 MED ORDER — ALBUTEROL SULFATE (2.5 MG/3ML) 0.083% IN NEBU
2.5000 mg | INHALATION_SOLUTION | RESPIRATORY_TRACT | Status: DC | PRN
  Administered 2017-06-27 – 2017-07-03 (×6): 2.5 mg via RESPIRATORY_TRACT
  Filled 2017-06-26 (×7): qty 3

## 2017-06-26 MED ORDER — METOPROLOL TARTRATE 25 MG PO TABS: 50.0000 mg | ORAL_TABLET | Freq: Two times a day (BID) | ORAL | Status: DC

## 2017-06-26 MED ORDER — ALBUTEROL SULFATE HFA 108 (90 BASE) MCG/ACT IN AERS: 2.0000 | INHALATION_SPRAY | RESPIRATORY_TRACT | Status: DC | PRN

## 2017-06-26 MED ORDER — LACTATED RINGERS IV SOLN: INTRAVENOUS | Status: DC

## 2017-06-26 MED ORDER — LOPERAMIDE HCL 2 MG PO CAPS
4.0000 mg | ORAL_CAPSULE | Freq: Once | ORAL | Status: AC
  Administered 2017-06-26: 4 mg via ORAL
  Filled 2017-06-26: qty 2

## 2017-06-26 MED ORDER — METOPROLOL TARTRATE 25 MG PO TABS
50.0000 mg | ORAL_TABLET | Freq: Two times a day (BID) | ORAL | Status: DC
Start: 2017-06-26 — End: 2017-07-02
  Administered 2017-06-26 – 2017-07-02 (×8): 50 mg via ORAL
  Filled 2017-06-26: qty 2
  Filled 2017-06-26: qty 1
  Filled 2017-06-26: qty 2
  Filled 2017-06-26: qty 1
  Filled 2017-06-26: qty 2
  Filled 2017-06-26 (×3): qty 1
  Filled 2017-06-26 (×2): qty 2
  Filled 2017-06-26 (×2): qty 1

## 2017-06-26 MED ORDER — DIPHENHYDRAMINE HCL 25 MG PO CAPS
25.0000 mg | ORAL_CAPSULE | Freq: Every evening | ORAL | Status: DC | PRN
  Administered 2017-06-26 – 2017-07-05 (×9): 25 mg via ORAL
  Filled 2017-06-26 (×9): qty 1

## 2017-06-26 NOTE — ED Provider Notes (Signed)
[facility] DEPT Provider Note   CSN: [id number] Arrival date & time: 06/25/17  2303     History   Chief Complaint Chief Complaint  Patient presents with  . Diarrhea    HPI Cassidy Sanders is a 77 y.o. female.  The history is provided by the patient, the spouse and a relative.  Diarrhea   This is a new problem. The current episode started 12 to 24 hours ago. The problem occurs more than 10 times per day. The problem has been gradually improving. The stool consistency is described as watery. There has been no fever. Associated symptoms include chills. Pertinent negatives include no abdominal pain, no vomiting and no cough. She has tried anti-motility drugs for the symptoms. The treatment provided moderate relief.  Patient with history of lung CA on current chemotherapy who presents with diarrhea.  She reports multiple episodes since yesterday.  No bloody stool.  No vomiting.  She feels weak.  No abdominal pain. No sick contacts.  No travel. No antibiotics Lives in assisted living with husband.  Past Medical History:  Diagnosis Date  . Brain tumor (benign) ([place])   . Depression   . Hypertension     Patient Active Problem List   Diagnosis Date Noted  . Chronic respiratory failure with hypoxia ([place]) 03/22/2017  . 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 Physical deconditioning 02/12/2017  . Pain of upper abdomen   . Acute respiratory failure ([place])   . SOB (shortness of breath)   . Hemoptysis   . Essential hypertension 02/01/2017  . Lung mass 02/01/2017  . Mediastinal mass   . 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 ABDOMINAL HYSTERECTOMY    . BLADDER SURGERY    . BRAIN SURGERY    . ENDOBRONCHIAL ULTRASOUND Bilateral 02/06/2017   Procedure: ENDOBRONCHIAL ULTRASOUND;  Surgeon: Juanito Doom, MD;  Location: [facility];  Service: Cardiopulmonary;  Laterality: Bilateral;    . VIDEO BRONCHOSCOPY WITH ENDOBRONCHIAL ULTRASOUND N/A 03/23/2017   Procedure: VIDEO BRONCHOSCOPY WITH ENDOBRONCHIAL ULTRASOUND;  Surgeon: Juanito Doom, MD;  Location: [facility] OR;  Service: Thoracic;  Laterality: N/A;     OB History   None      Home Medications    Prior to Admission medications   Medication Sig Start Date End Date Taking? Authorizing Provider  albuterol (PROVENTIL HFA;VENTOLIN HFA) 108 (90 Base) MCG/ACT inhaler Inhale 2 puffs into the lungs every 4 (four) hours as needed for wheezing or shortness of breath. 05/01/17   Tanner, Lyndon Code., PA-C  Biotin 2.5 MG TABS Take 2,500 mcg by mouth daily.    [provider]  chlorpheniramine-HYDROcodone (TUSSIONEX) 10-8 MG/5ML SUER Take 5 mLs by mouth every 12 (twelve) hours as needed for cough. 03/23/17   Juanito Doom, MD  hydrochlorothiazide (MICROZIDE) 12.5 MG capsule Take 12.5 mg by mouth daily. 04/28/17   [provider]  Lactulose 20 GM/30ML SOLN Take 30 mLs (20 g total) by mouth 4 (four) times daily as needed. Patient not taking: Reported on 06/12/2017 05/04/17   Maryanna Shape, NP  metoprolol tartrate (LOPRESSOR) 50 MG tablet Take 0.5 tablets (25 mg total) by mouth 2 (two) times daily. Patient taking differently: Take 50 mg by mouth 2 (two) times daily.  02/12/17 02/12/18  Erick Colace, NP  Multiple Vitamins-Minerals (ADULT GUMMY) CHEW Chew 1 tablet by mouth daily.    [provider]  prochlorperazine (COMPAZINE) 10 MG tablet  Take 1 tablet (10 mg total) by mouth every 6 (six) hours as needed for nausea or vomiting. 04/17/17   Curt Bears, MD  sertraline (ZOLOFT) 50 MG tablet Take 50 mg by mouth daily.    [provider]    Family History History reviewed. No pertinent family history.  Social History Social History   Tobacco Use  . Smoking status: Former Smoker    Packs/day: 1.00    Years: 59.00    Pack years: 59.00    Types: Cigarettes    Last attempt to quit: 06/11/2012     Years since quitting: 5.0  . Smokeless tobacco: Never Used  Substance Use Topics  . Alcohol use: Yes    Comment: Weekly   . Drug use: No     Allergies   Latex; Gabapentin; and Tape   Review of Systems Review of Systems  Constitutional: Positive for chills. Negative for fever.  Respiratory: Negative for cough and shortness of breath.   Cardiovascular: Negative for chest pain.  Gastrointestinal: Positive for diarrhea. Negative for abdominal pain, blood in stool and vomiting.  Neurological: Negative for syncope.  All other systems reviewed and are negative.    Physical Exam Updated Vital Signs BP 121/79 (BP Location: Right Arm)   Pulse (!) 112   Temp 98.3 F (36.8 C) (Oral)   Resp 15   Ht 1.753 m (5\' 9" )   Wt 77.7 kg (171 lb 3 oz)   SpO2 96%   BMI 25.28 kg/m   Physical Exam  CONSTITUTIONAL: Elderly and frail HEAD: Normocephalic/atraumatic EYES: EOMI/PERRL ENMT: Mucous membranes dry NECK: supple no meningeal signs SPINE/BACK:entire spine nontender CV: S1/S2 noted, no murmurs/rubs/gallops noted LUNGS: Scattered wheeze, referred upper airway sounds, no distress ABDOMEN: soft, nontender, no rebound or guarding, bowel sounds noted throughout abdomen GU:no cva tenderness NEURO: Pt is awake/alert/appropriate, moves all extremitiesx4.  No facial droop.   EXTREMITIES: pulses normal/equal, full ROM SKIN: warm, color normal PSYCH: no abnormalities of mood noted, alert and oriented to situation  ED Treatments / Results  Labs (all labs ordered are listed, but only abnormal results are displayed) Labs Reviewed  COMPREHENSIVE METABOLIC PANEL - Abnormal; Notable for the following components:      Result Value   Potassium 3.3 (*)    Calcium 8.2 (*)    Total Protein 5.9 (*)    Albumin 2.6 (*)    All other components within normal limits  CBC WITH DIFFERENTIAL/PLATELET - Abnormal; Notable for the following components:   WBC 1.5 (*)    RBC 3.53 (*)    Hemoglobin 9.1 (*)      HCT 29.9 (*)    MCH 25.8 (*)    RDW 18.4 (*)    Neutro Abs 0.8 (*)    Lymphs Abs 0.5 (*)    All other components within normal limits  GASTROINTESTINAL PANEL BY PCR, STOOL (REPLACES STOOL CULTURE)  C DIFFICILE QUICK SCREEN W PCR REFLEX    EKG None  Radiology No results found.  Procedures Procedures   Medications Ordered in ED Medications  sodium chloride 0.9 % bolus 1,000 mL (0 mLs Intravenous Stopped 06/26/17 0125)  sodium chloride 0.9 % bolus 1,000 mL (0 mLs Intravenous Stopped 06/26/17 0210)  loperamide (IMODIUM) capsule 4 mg (4 mg Oral Given 06/26/17 0302)     Initial Impression / Assessment and Plan / ED Course  I have reviewed the triage vital signs and the nursing notes.  Pertinent labs results that were available during my care of  the patient were reviewed by me and considered in my medical decision making (see chart for details).    3:36 AM  Patient continues to have diarrhea.  She has been given at least 2 L of fluid still feeling lightheaded.  She is still tachycardic.  I am concerned that she may continue to have ongoing volume loss at home.  Suspect it may be medication related.  GI panel has been sent just to rule out infectious cause.  Of note she is also leukopenic/neutropenic.  However she is afebrile.  This is likely due to her chemotherapy  Discussed with Dr. Maudie Mercury for admission  Final Clinical Impressions(s) / ED Diagnoses   Final diagnoses:  Diarrhea of presumed infectious origin  Dehydration  Other drug-induced neutropenia [facility])    ED Discharge Orders    None       Ripley Fraise, MD 06/26/17 [phone]

## 2017-06-26 NOTE — ED Notes (Signed)
Attempted to collect stool sample. Pt only having minor liquid bowel movements at this time. Specimen not large enough. Will reattempt later.

## 2017-06-26 NOTE — Telephone Encounter (Signed)
Patient called to cancel she is in the hospital

## 2017-06-26 NOTE — ED Notes (Signed)
ED TO INPATIENT HANDOFF REPORT  Name/Age/Gender Cassidy Sanders 77 y.o. female  Code Status    Code Status Orders  (From admission, onward)        Start     Ordered   06/26/17 1028  Do not attempt resuscitation (DNR)  Continuous    Question Answer Comment  In the event of cardiac or respiratory ARREST Do not call a "code blue"   In the event of cardiac or respiratory ARREST Do not perform Intubation, CPR, defibrillation or ACLS   In the event of cardiac or respiratory ARREST Use medication by any route, position, wound care, and other measures to relive pain and suffering. May use oxygen, suction and manual treatment of airway obstruction as needed for comfort.      06/26/17 1027    Code Status History    Date Active Date Inactive Code Status Order ID Comments User Context   01/31/2017 2026 02/12/2017 2051 Full Code [id number]  Oswald Hillock, MD Inpatient    Advance Directive Documentation     Most Recent Value  Type of Advance Directive  Healthcare Power of Attorney, Living will  Pre-existing out of facility DNR order (yellow form or pink MOST form)  -  "MOST" Form in Place?  -      Home/SNF/Other Nursing Home  Chief Complaint Diarrhea,Dehydration  Level of Care/Admitting Diagnosis ED Disposition    ED Disposition Condition [place]: [facility] [[id number]]  Level of Care: Telemetry [5]  Admit to tele based on following criteria: Monitor for Ischemic changes  Diagnosis: Diarrhea [787.91.ICD-9-CM]  Admitting Physician: Jani Gravel [[id number]]  Attending Physician: Jani Gravel [phone]  Estimated length of stay: past midnight tomorrow  Certification:: I certify this patient will need inpatient services for at least 2 midnights  PT Class (Do Not Modify): Inpatient [101]  PT Acc Code (Do Not Modify): Private [1]       Medical History Past Medical History:  Diagnosis Date  . Brain tumor (benign) ([place])   . Depression   .  Hypertension     Allergies Allergies  Allergen Reactions  . Latex Dermatitis  . Gabapentin Other (See Comments)    Excessive sleep   . Tape Rash    IV Location/Drains/Wounds Patient Lines/Drains/Airways Status   Active Line/Drains/Airways    Name:   Placement date:   Placement time:   Site:   Days:   Peripheral IV 06/26/17 Left Antecubital   06/26/17    0036    Antecubital   less than 1          Labs/Imaging Results for orders placed or performed during the hospital encounter of 06/25/17 (from the past 48 hour(s))  Comprehensive metabolic panel     Status: Abnormal   Collection Time: 06/26/17 12:52 AM  Result Value Ref Range   Sodium 138 135 - 145 mmol/L   Potassium 3.3 (L) 3.5 - 5.1 mmol/L   Chloride 106 101 - 111 mmol/L   CO2 24 22 - 32 mmol/L   Glucose, Bld 89 65 - 99 mg/dL   BUN 18 6 - 20 mg/dL   Creatinine, Ser 0.61 0.44 - 1.00 mg/dL   Calcium 8.2 (L) 8.9 - 10.3 mg/dL   Total Protein 5.9 (L) 6.5 - 8.1 g/dL   Albumin 2.6 (L) 3.5 - 5.0 g/dL   AST 21 15 - 41 U/L   ALT 16 14 - 54 U/L   Alkaline Phosphatase 76 38 - 126  U/L   Total Bilirubin 0.7 0.3 - 1.2 mg/dL   GFR calc non Af Amer >60 >60 mL/min   GFR calc Af Amer >60 >60 mL/min    Comment: (NOTE) The eGFR has been calculated using the CKD EPI equation. This calculation has not been validated in all clinical situations. eGFR's persistently <60 mL/min signify possible Chronic Kidney Disease.    Anion gap 8 5 - 15    Comment: Performed at [facility], [place] [address] [place], [place] [address]  CBC with Differential/Platelet     Status: Abnormal   Collection Time: 06/26/17 12:52 AM  Result Value Ref Range   WBC 1.5 (L) 4.0 - 10.5 K/uL   RBC 3.53 (L) 3.87 - 5.11 MIL/uL   Hemoglobin 9.1 (L) 12.0 - 15.0 g/dL   HCT 29.9 (L) 36.0 - 46.0 %   MCV 84.7 78.0 - 100.0 fL   MCH 25.8 (L) 26.0 - 34.0 pg   MCHC 30.4 30.0 - 36.0 g/dL   RDW 18.4 (H) 11.5 - 15.5 %   Platelets 182 150 - 400 K/uL    Neutrophils Relative % 39 %   Lymphocytes Relative 32 %   Monocytes Relative 14 %   Eosinophils Relative 0 %   Basophils Relative 0 %   Band Neutrophils 11 %   Metamyelocytes Relative 4 %   Myelocytes 0 %   Promyelocytes Relative 0 %   Blasts 0 %   nRBC 0 0 /100 WBC   Other 0 %   Neutro Abs 0.8 (L) 1.7 - 7.7 K/uL   Lymphs Abs 0.5 (L) 0.7 - 4.0 K/uL   Monocytes Absolute 0.2 0.1 - 1.0 K/uL   Eosinophils Absolute 0.0 0.0 - 0.7 K/uL   Basophils Absolute 0.0 0.0 - 0.1 K/uL   RBC Morphology POLYCHROMASIA PRESENT     Comment: Performed at [facility], [place] [address], [place],  [address]   No results found.  Pending Labs Unresulted Labs (From admission, onward)   Start     Ordered   06/26/17 1027  TSH  Once,   R     06/26/17 1026   06/26/17 1026  C difficile quick scan w PCR reflex  (C Difficile quick screen w PCR reflex panel)  Once, for 24 hours,   R     06/26/17 1026   06/26/17 0255  Gastrointestinal Panel by PCR , Stool  (Gastrointestinal Panel by PCR, Stool)  Once,   R     06/26/17 0255   Signed and Held  Creatinine, serum  (enoxaparin (LOVENOX)    CrCl >/= 30 ml/min)  Weekly,   R    Comments:  while on enoxaparin therapy    Signed and Held   Signed and Held  Comprehensive metabolic panel  Once,   R     Signed and Held   Signed and Held  CBC  Once,   R     Signed and Held      Vitals/Pain Today's Vitals   06/26/17 1000 06/26/17 1030 06/26/17 1200 06/26/17 1200  BP: 135/79 (!) 150/90 (!) 144/89 (!) 144/89  Pulse: (!) 119 (!) 122 (!) 118 (!) 118  Resp: (!) 26 (!) 22  20  Temp:      TempSrc:      SpO2: 95% 94%  95%  Weight:      Height:        Isolation Precautions Enteric precautions (UV disinfection)  Medications Medications  lactated  ringers infusion (has no administration in time range)  metoprolol tartrate (LOPRESSOR) tablet 50 mg (50 mg Oral Given 06/26/17 1200)  metroNIDAZOLE (FLAGYL) IVPB 500 mg (has no administration in time  range)  potassium chloride SA (K-DUR,KLOR-CON) CR tablet 40 mEq (40 mEq Oral Given 06/26/17 1200)  sodium chloride 0.9 % bolus 1,000 mL (0 mLs Intravenous Stopped 06/26/17 0125)  sodium chloride 0.9 % bolus 1,000 mL (0 mLs Intravenous Stopped 06/26/17 0210)  loperamide (IMODIUM) capsule 4 mg (4 mg Oral Given 06/26/17 0302)    Mobility walks with person assist

## 2017-06-26 NOTE — ED Notes (Addendum)
ED TO INPATIENT HANDOFF REPORT  Name/Age/Gender Cassidy Sanders 77 y.o. female  Code Status Code Status History    Date Active Date Inactive Code Status Order ID Comments User Context   01/31/2017 2026 02/12/2017 2051 Full Code [id number]  Oswald Hillock, MD Inpatient      Home/SNF/Other Home  Chief Complaint Diarrhea,Dehydration  Level of Care/Admitting Diagnosis ED Disposition    ED Disposition Condition [place]: [facility] [[id number]]  Level of Care: Telemetry [5]  Admit to tele based on following criteria: Monitor for Ischemic changes  Diagnosis: Diarrhea [787.91.ICD-9-CM]  Admitting Physician: Jani Gravel [[id number]]  Attending Physician: Jani Gravel [phone]  Estimated length of stay: past midnight tomorrow  Certification:: I certify this patient will need inpatient services for at least 2 midnights  PT Class (Do Not Modify): Inpatient [101]  PT Acc Code (Do Not Modify): Private [1]       Medical History Past Medical History:  Diagnosis Date  . Brain tumor (benign) ([place])   . Depression   . Hypertension     Allergies Allergies  Allergen Reactions  . Latex Dermatitis  . Gabapentin Other (See Comments)    Excessive sleep   . Tape Rash    IV Location/Drains/Wounds Patient Lines/Drains/Airways Status   Active Line/Drains/Airways    Name:   Placement date:   Placement time:   Site:   Days:   Peripheral IV 06/26/17 Left Antecubital   06/26/17    0036    Antecubital   less than 1          Labs/Imaging Results for orders placed or performed during the hospital encounter of 06/25/17 (from the past 48 hour(s))  Comprehensive metabolic panel     Status: Abnormal   Collection Time: 06/26/17 12:52 AM  Result Value Ref Range   Sodium 138 135 - 145 mmol/L   Potassium 3.3 (L) 3.5 - 5.1 mmol/L   Chloride 106 101 - 111 mmol/L   CO2 24 22 - 32 mmol/L   Glucose, Bld 89 65 - 99 mg/dL   BUN 18 6 - 20 mg/dL   Creatinine, Ser 0.61  0.44 - 1.00 mg/dL   Calcium 8.2 (L) 8.9 - 10.3 mg/dL   Total Protein 5.9 (L) 6.5 - 8.1 g/dL   Albumin 2.6 (L) 3.5 - 5.0 g/dL   AST 21 15 - 41 U/L   ALT 16 14 - 54 U/L   Alkaline Phosphatase 76 38 - 126 U/L   Total Bilirubin 0.7 0.3 - 1.2 mg/dL   GFR calc non Af Amer >60 >60 mL/min   GFR calc Af Amer >60 >60 mL/min    Comment: (NOTE) The eGFR has been calculated using the CKD EPI equation. This calculation has not been validated in all clinical situations. eGFR's persistently <60 mL/min signify possible Chronic Kidney Disease.    Anion gap 8 5 - 15    Comment: Performed at [facility], [place] [address], [place], [place] [address]  CBC with Differential/Platelet     Status: Abnormal   Collection Time: 06/26/17 12:52 AM  Result Value Ref Range   WBC 1.5 (L) 4.0 - 10.5 K/uL   RBC 3.53 (L) 3.87 - 5.11 MIL/uL   Hemoglobin 9.1 (L) 12.0 - 15.0 g/dL   HCT 29.9 (L) 36.0 - 46.0 %   MCV 84.7 78.0 - 100.0 fL   MCH 25.8 (L) 26.0 - 34.0 pg   MCHC 30.4 30.0 - 36.0 g/dL  RDW 18.4 (H) 11.5 - 15.5 %   Platelets 182 150 - 400 K/uL   Neutrophils Relative % 39 %   Lymphocytes Relative 32 %   Monocytes Relative 14 %   Eosinophils Relative 0 %   Basophils Relative 0 %   Band Neutrophils 11 %   Metamyelocytes Relative 4 %   Myelocytes 0 %   Promyelocytes Relative 0 %   Blasts 0 %   nRBC 0 0 /100 WBC   Other 0 %   Neutro Abs 0.8 (L) 1.7 - 7.7 K/uL   Lymphs Abs 0.5 (L) 0.7 - 4.0 K/uL   Monocytes Absolute 0.2 0.1 - 1.0 K/uL   Eosinophils Absolute 0.0 0.0 - 0.7 K/uL   Basophils Absolute 0.0 0.0 - 0.1 K/uL   RBC Morphology POLYCHROMASIA PRESENT     Comment: Performed at [facility], [place] [address], [place], [place] [address]   No results found.  Pending Labs Unresulted Labs (From admission, onward)   Start     Ordered   06/26/17 0255  Gastrointestinal Panel by PCR , Stool  (Gastrointestinal Panel by PCR, Stool)  Once,   R     06/26/17 0255   06/26/17  0255  C difficile quick scan w PCR reflex  (C Difficile quick screen w PCR reflex panel)  Once, for 24 hours,   R     06/26/17 0255   Signed and Held  Creatinine, serum  (enoxaparin (LOVENOX)    CrCl >/= 30 ml/min)  Weekly,   R    Comments:  while on enoxaparin therapy    Signed and Held   Signed and Held  Comprehensive metabolic panel  Once,   R     Signed and Held   Signed and Held  CBC  Once,   R     Signed and Held      Vitals/Pain Today's Vitals   06/26/17 0330 06/26/17 0400 06/26/17 0430 06/26/17 0500  BP: 134/76 134/77 125/75 136/65  Pulse: (!) 108 (!) 111 (!) 109 (!) 116  Resp: (!) 23 (!) 24 (!) 25 (!) 26  Temp:      TempSrc:      SpO2: 97% 94% 94% 94%  Weight:      Height:        Isolation Precautions Enteric precautions (UV disinfection)  Medications Medications  sodium chloride 0.9 % bolus 1,000 mL (0 mLs Intravenous Stopped 06/26/17 0125)  sodium chloride 0.9 % bolus 1,000 mL (0 mLs Intravenous Stopped 06/26/17 0210)  loperamide (IMODIUM) capsule 4 mg (4 mg Oral Given 06/26/17 0302)    Mobility walks with device

## 2017-06-26 NOTE — ED Notes (Signed)
Not enough stool for testing

## 2017-06-26 NOTE — Progress Notes (Signed)
@IPLOG @        PROGRESS NOTE                                                                                                                                                                                                             Patient Demographics:    Cassidy Sanders, is a 77 y.o. female, DOB - 1940-06-24, MWN:[id number]  Admit date - 06/25/2017   Admitting Physician No admitting provider for patient encounter.  Outpatient Primary MD for the patient is [name], MD  LOS - 0  Chief Complaint  Patient presents with  . Diarrhea       Brief Narrative   Cassidy Sanders  is a 77 y.o. female, w hypertension, w metastatic non-small cell carcinoma (T3, N2 M1a)  Lung cancer apparently under the care of Dr. Julien Nordmann presented with chief complaints of c/o diarrhea for the past 2 days.  Pt has been going 10 + times per day.  Pt denies fever, chills, abd pain n/v, constpiation, brbpr.  Pt denies travel or odd food eaten, or abx over the past month, denies any known antibiotic exposure or sick contacts.  In the ER she was found to be dehydrated, tachycardic, no abdominal pain with benign abdominal exam and admitted for further care for diarrhea.      Subjective:    Cassidy Sanders today has, No headache, No chest pain, No abdominal pain - No Nausea, No new weakness tingling or numbness, No Cough - SOB.  Diarrhea has improved somewhat.  Feels weak all over.   Assessment  & Plan :    Principal Problem:   Diarrhea Active Problems:   Lung mass   1.  Gastroenteritis.  Causing dehydration, check stool studies along with C. difficile PCR, clinical chances of C. difficile are low, this appears to be infectious gastroenteritis, will give trial of Flagyl, bowel rest with clear diet and supportive care.  If C. difficile is ruled out will start her on Imodium.  2. H/O stage IV non-small cell lung cancer presented with large right suprahilar lung mass in addition to mediastinal  lymphadenopathy and suspicious malignant right pleural effusion -now supportive care have added Dr. Julien Nordmann to treatment team, she is currently undergoing chemotherapy with carboplatin, paclitaxel and Keytruda.  3.  Leukopenia and anemia of chronic disease.  Likely due to combination of malignancy and chemo.  Monitor.  4.  Hypertension.  Continue home dose beta-blocker, hold diuretics and monitor.  5.  History of anxiety.  Continue home medication Zoloft.  Diet :  Clears  Family Communication  :  None  Code Status :  DNR  Disposition Plan  :  TBD  Consults  :  None  Procedures  :      DVT Prophylaxis  :  Lovenox    Lab Results  Component Value Date   PLT 182 06/26/2017    Inpatient Medications  Scheduled Meds: . metoprolol tartrate  50 mg Oral BID  . potassium chloride  40 mEq Oral Q6H   Continuous Infusions: . lactated ringers    . metronidazole     PRN Meds:.  Antibiotics  :    Anti-infectives (From admission, onward)   Start     Dose/Rate Route Frequency Ordered Stop   06/26/17 1030  metroNIDAZOLE (FLAGYL) IVPB 500 mg  Status:  Discontinued     500 mg 100 mL/hr over 60 Minutes Intravenous Every 8 hours 06/26/17 1022 06/26/17 1026   06/26/17 1030  metroNIDAZOLE (FLAGYL) IVPB 500 mg     500 mg 100 mL/hr over 60 Minutes Intravenous Every 8 hours 06/26/17 1026           Objective:   Vitals:   06/26/17 0730 06/26/17 0800 06/26/17 0900 06/26/17 1000  BP: 133/81 (!) 141/86 (!) 147/82 135/79  Pulse: (!) 112 (!) 113 (!) 123 (!) 119  Resp: (!) 9 (!) 21 14 (!) 26  Temp:      TempSrc:      SpO2: 97% 97% 92% 95%  Weight:      Height:        Wt Readings from Last 3 Encounters:  06/25/17 77.7 kg (171 lb 3 oz)  06/12/17 77.7 kg (171 lb 3.2 oz)  05/22/17 79.3 kg (174 lb 12.8 oz)     Intake/Output Summary (Last 24 hours) at 06/26/2017 1028 Last data filed at 06/26/2017 0914 Gross per 24 hour  Intake 2000 ml  Output 1 ml  Net 1999 ml     Physical  Exam  Awake Alert, Oriented X 3, No new F.N deficits, Normal affect [place].AT,PERRAL Supple Neck,No JVD, No cervical lymphadenopathy appriciated.  Symmetrical Chest wall movement, Good air movement bilaterally, CTAB RRR,No Gallops,Rubs or new Murmurs, No Parasternal Heave +ve B.Sounds, Abd Soft, No tenderness, No organomegaly appriciated, No rebound - guarding or rigidity. No Cyanosis, Clubbing or edema, No new Rash or bruise       Data Review:    CBC Recent Labs  Lab 06/26/17 0052  WBC 1.5*  HGB 9.1*  HCT 29.9*  PLT 182  MCV 84.7  MCH 25.8*  MCHC 30.4  RDW 18.4*  LYMPHSABS 0.5*  MONOABS 0.2  EOSABS 0.0  BASOSABS 0.0    Chemistries  Recent Labs  Lab 06/26/17 0052  NA 138  K 3.3*  CL 106  CO2 24  GLUCOSE 89  BUN 18  CREATININE 0.61  CALCIUM 8.2*  AST 21  ALT 16  ALKPHOS 76  BILITOT 0.7   ------------------------------------------------------------------------------------------------------------------ No results for input(s): CHOL, HDL, LDLCALC, TRIG, CHOLHDL, LDLDIRECT in the last 72 hours.  No results found for: HGBA1C ------------------------------------------------------------------------------------------------------------------ No results for input(s): TSH, T4TOTAL, T3FREE, THYROIDAB in the last 72 hours.  Invalid input(s): FREET3 ------------------------------------------------------------------------------------------------------------------ No results for input(s): VITAMINB12, FOLATE, FERRITIN, TIBC, IRON, RETICCTPCT in the last 72 hours.  Coagulation profile No results for input(s): INR, PROTIME in the last 168 hours.  No results for input(s): DDIMER in the last 72 hours.  Cardiac Enzymes No results for input(s): CKMB, TROPONINI, MYOGLOBIN in the last  168 hours.  Invalid input(s): CK ------------------------------------------------------------------------------------------------------------------ No results found for: BNP  Micro Results No  results found for this or any previous visit (from the past 240 hour(s)).  Radiology Reports No results found.  Time Spent in minutes  30   Lala Lund M.D on 06/26/2017 at 10:28 AM  Between 7am to 7pm - Pager - [phone] ( page via [email or web address], text pages only, please mention full 10 digit call back number). After 7pm go to [email or web address] - password [facility]

## 2017-06-26 NOTE — H&P (Signed)
TRH H&P   Patient Demographics:    Cassidy Sanders, is a 77 y.o. female  MRN: [id number]   DOB - November 27, 1940  Admit Date - 06/25/2017  Outpatient Primary MD for the patient is [name], MD  Referring MD/NP/PA:  Ripley Fraise  Outpatient Specialists:   Patient coming from: home  Chief Complaint  Patient presents with  . Diarrhea      HPI:    Cassidy Sanders  is a 77 y.o. female, w hypertension, w metastatic non-small cell carcinoma (T3, N2 M1a)  Lung cancer apparently c/o diarrhea for the past 1.5 days.  Pt has been going 10 + times per day.  Pt denies fever, chills, abd pain n/v, constpiation, brbpr.  Pt denies travel or odd food eaten, or abx over the past 35months.   In Ed.  Na 138, K 3.3, Bun 18, Creatinine 0.61 Alb 2.6 Wbc 1.5, Hgb 9.1, Plt 182  GI pathogen panel, C. Diff pending  Pt will be admitted for diarrhea and dehydration.     Review of systems:    In addition to the HPI above,  No Fever-chills, No Headache, No changes with Vision or hearing, No problems swallowing food or Liquids, No Chest pain, Cough or Shortness of Breath, No Abdominal pain, No Nausea or VommittingNo Blood in stool or Urine, No dysuria, No new skin rashes or bruises, No new joints pains-aches,  No new weakness, tingling, numbness in any extremity, No recent weight gain or loss, No polyuria, polydypsia or polyphagia, No significant Mental Stressors.  A full 10 point Review of Systems was done, except as stated above, all other Review of Systems were negative.   With Past History of the following :    Past Medical History:  Diagnosis Date  . Brain tumor (benign) ([place])   . Depression   . Hypertension       Past Surgical History:  Procedure Laterality Date  . ABDOMINAL HYSTERECTOMY    . BLADDER SURGERY    . BRAIN SURGERY    . ENDOBRONCHIAL ULTRASOUND Bilateral  02/06/2017   Procedure: ENDOBRONCHIAL ULTRASOUND;  Surgeon: Juanito Doom, MD;  Location: [facility];  Service: Cardiopulmonary;  Laterality: Bilateral;  . VIDEO BRONCHOSCOPY WITH ENDOBRONCHIAL ULTRASOUND N/A 03/23/2017   Procedure: VIDEO BRONCHOSCOPY WITH ENDOBRONCHIAL ULTRASOUND;  Surgeon: Juanito Doom, MD;  Location: [facility] OR;  Service: Thoracic;  Laterality: N/A;      Social History:     Social History   Tobacco Use  . Smoking status: Former Smoker    Packs/day: 1.00    Years: 59.00    Pack years: 59.00    Types: Cigarettes    Last attempt to quit: 06/11/2012    Years since quitting: 5.0  . Smokeless tobacco: Never Used  Substance Use Topics  . Alcohol use: Yes    Comment: Weekly      Lives - at  home  Mobility - walks by self   Family History :    History reviewed. No pertinent family history. Negative for  Lung cancer   Home Medications:   Prior to Admission medications   Medication Sig Start Date End Date Taking? Authorizing Provider  albuterol (PROVENTIL HFA;VENTOLIN HFA) 108 (90 Base) MCG/ACT inhaler Inhale 2 puffs into the lungs every 4 (four) hours as needed for wheezing or shortness of breath. 05/01/17  Yes Tanner, Lyndon Code., PA-C  Biotin 2.5 MG TABS Take 2,500 mcg by mouth daily.   Yes [provider]  hydrochlorothiazide (MICROZIDE) 12.5 MG capsule Take 12.5 mg by mouth daily. 04/28/17  Yes [provider]  loperamide (IMODIUM) 2 MG capsule Take 2 mg by mouth as needed for diarrhea or loose stools.   Yes [provider]  metoprolol tartrate (LOPRESSOR) 50 MG tablet Take 0.5 tablets (25 mg total) by mouth 2 (two) times daily. Patient taking differently: Take 50 mg by mouth daily.  02/12/17 02/12/18 Yes Erick Colace, NP  sertraline (ZOLOFT) 50 MG tablet Take 50 mg by mouth daily.   Yes [provider]  chlorpheniramine-HYDROcodone (TUSSIONEX) 10-8 MG/5ML SUER Take 5 mLs by mouth every 12 (twelve) hours as needed for  cough. Patient not taking: Reported on 06/26/2017 03/23/17   Juanito Doom, MD  Lactulose 20 GM/30ML SOLN Take 30 mLs (20 g total) by mouth 4 (four) times daily as needed. Patient not taking: Reported on 06/12/2017 05/04/17   Maryanna Shape, NP  prochlorperazine (COMPAZINE) 10 MG tablet Take 1 tablet (10 mg total) by mouth every 6 (six) hours as needed for nausea or vomiting. Patient not taking: Reported on 06/26/2017 04/17/17   Curt Bears, MD     Allergies:     Allergies  Allergen Reactions  . Latex Dermatitis  . Gabapentin Other (See Comments)    Excessive sleep   . Tape Rash     Physical Exam:   Vitals  Blood pressure 134/77, pulse (!) 111, temperature 98.3 F (36.8 C), temperature source Oral, resp. rate (!) 24, height 5\' 9"  (1.753 m), weight 77.7 kg (171 lb 3 oz), SpO2 94 %.   1. General  lying in bed in NAD,  2. Normal affect and insight, Not Suicidal or Homicidal, Awake Alert, Oriented X 3.  3. No F.N deficits, ALL C.Nerves Intact, Strength 5/5 all 4 extremities, Sensation intact all 4 extremities, Plantars down going.  4. Ears and Eyes appear Normal, Conjunctivae clear, PERRLA. Moist Oral Mucosa.  5. Supple Neck, No JVD, No cervical lymphadenopathy appriciated, No Carotid Bruits.  6. Symmetrical Chest wall movement, Good air movement bilaterally, CTAB.  7. RRR, No Gallops, Rubs or Murmurs, No Parasternal Heave.  8. Positive Bowel Sounds, Abdomen Soft, No tenderness, No organomegaly appriciated,No rebound -guarding or rigidity.  9.  No Cyanosis, Normal Skin Turgor, No Skin Rash or Bruise.  10. Good muscle tone,  joints appear normal , no effusions, Normal ROM.  11. No Palpable Lymph Nodes in Neck or Axillae      Data Review:    CBC Recent Labs  Lab 06/19/17 0958 06/26/17 0052  WBC 2.4* 1.5*  HGB  --  9.1*  HCT 28.9* 29.9*  PLT 198 182  MCV 82.9 84.7  MCH 27.0 25.8*  MCHC 32.5 30.4  RDW 17.7* 18.4*  LYMPHSABS 0.4* 0.5*  MONOABS 0.1 0.2    EOSABS 0.0 0.0  BASOSABS 0.0 0.0   ------------------------------------------------------------------------------------------------------------------  Chemistries  Recent Labs  Lab  06/19/17 0958 06/26/17 0052  NA 142 138  K 4.3 3.3*  CL 104 106  CO2 31* 24  GLUCOSE 114 89  BUN 14 18  CREATININE 0.70 0.61  CALCIUM 9.4 8.2*  AST 17 21  ALT 18 16  ALKPHOS 93 76  BILITOT 0.5 0.7   ------------------------------------------------------------------------------------------------------------------ estimated creatinine clearance is 60.6 mL/min (by C-G formula based on SCr of 0.61 mg/dL). ------------------------------------------------------------------------------------------------------------------ No results for input(s): TSH, T4TOTAL, T3FREE, THYROIDAB in the last 72 hours.  Invalid input(s): FREET3  Coagulation profile No results for input(s): INR, PROTIME in the last 168 hours. ------------------------------------------------------------------------------------------------------------------- No results for input(s): DDIMER in the last 72 hours. -------------------------------------------------------------------------------------------------------------------  Cardiac Enzymes No results for input(s): CKMB, TROPONINI, MYOGLOBIN in the last 168 hours.  Invalid input(s): CK ------------------------------------------------------------------------------------------------------------------ No results found for: BNP   ---------------------------------------------------------------------------------------------------------------  Urinalysis No results found for: COLORURINE, APPEARANCEUR, LABSPEC, [place], GLUCOSEU, HGBUR, BILIRUBINUR, KETONESUR, PROTEINUR, UROBILINOGEN, NITRITE, LEUKOCYTESUR  ----------------------------------------------------------------------------------------------------------------   Imaging Results:    No results found.    Assessment & Plan:     Principal Problem:   Diarrhea Active Problems:   Lung mass    Diarrhea Stool for C. Diff Stool for GI pathogen panel STOP Lactulose Flagyl 500mg  po tid  Florastor  metastatic lung cancer Please f/u with Dr. Julien Nordmann  Hypertension Cont lopressor Cont hydrochlorothiazide  Anxiety Cont Zoloft   DVT Prophylaxis   Lovenox - SCDs   AM Labs Ordered, also please review Full Orders  Family Communication: Admission, patients condition and plan of care including tests being ordered have been discussed with the patient  who indicate understanding and agree with the plan and Code Status.  Code Status FULL CODE  Likely DC to  home  Condition GUARDED    Consults called: none  Admission status: inpatient   Time spent in minutes : 45   Jani Gravel M.D on 06/26/2017 at 4:18 AM  Between 7am to 7pm - Pager - [phone]. After 7pm go to [email or web address] - password [facility]  [facility] - Office  [phone]

## 2017-06-26 NOTE — ED Notes (Signed)
Pt reported she had a bowel movement. Attempted to collect stool sample again. Not enough stool present to obtain specimen. Pt continues to have minor liquid bowel movements insufficient for testing.

## 2017-06-27 ENCOUNTER — Inpatient Hospital Stay (HOSPITAL_COMMUNITY): Payer: Medicare Other

## 2017-06-27 ENCOUNTER — Ambulatory Visit: Payer: Medicare Other | Admitting: Pulmonary Disease

## 2017-06-27 DIAGNOSIS — R0689 Other abnormalities of breathing: Secondary | ICD-10-CM

## 2017-06-27 MED ORDER — IOHEXOL 300 MG/ML  SOLN
75.0000 mL | Freq: Once | INTRAMUSCULAR | Status: AC | PRN
  Administered 2017-06-27: 75 mL via INTRAVENOUS

## 2017-06-27 MED ORDER — ALBUTEROL SULFATE (2.5 MG/3ML) 0.083% IN NEBU
2.5000 mg | INHALATION_SOLUTION | Freq: Three times a day (TID) | RESPIRATORY_TRACT | Status: DC
  Administered 2017-06-27 – 2017-07-03 (×19): 2.5 mg via RESPIRATORY_TRACT
  Filled 2017-06-27 (×21): qty 3

## 2017-06-27 NOTE — Evaluation (Signed)
Physical Therapy Evaluation Patient Details Name: Cassidy Sanders MRN: [id number] DOB: 04/01/1940 Today's Date: 06/27/2017   History of Present Illness  Cassidy Sanders is a 77 y.o. female with a history of metastatic non-small cell carcinoma, hypertension. She presented with diarrhea and found to be dehydrated.  Clinical Impression  Patient presents with decreased independence with mobility due to weakness from illness and deconditioning and she will benefit from skilled PT in the acute setting to allow return home with follow up HHPT.      Follow Up Recommendations Home health PT(at ILF)    Equipment Recommendations  None recommended by PT    Recommendations for Other Services       Precautions / Restrictions Precautions Precautions: Fall Precaution Comments: frequent stools (?c-diff)      Mobility  Bed Mobility Overal bed mobility: Modified Independent                Transfers Overall transfer level: Needs assistance Equipment used: Rolling walker (2 wheeled) Transfers: Sit to/from Stand Sit to Stand: Min guard         General transfer comment: for safety due to weakness  Ambulation/Gait Ambulation/Gait assistance: Min guard Ambulation Distance (Feet): 50 Feet Assistive device: Rolling walker (2 wheeled) Gait Pattern/deviations: Step-through pattern;Decreased stride length     General Gait Details: stable with walker, but quickly fatigues SpO2 78% on 3L O2 [place], back up to 90% in < 42min  Stairs            Wheelchair Mobility    Modified Rankin (Stroke Patients Only)       Balance Overall balance assessment: Needs assistance   Sitting balance-Leahy Scale: Good       Standing balance-Leahy Scale: Fair                               Pertinent Vitals/Pain Pain Assessment: No/denies pain    Home Living Family/patient expects to be discharged to:: Private residence Living Arrangements: Spouse/significant other(spouse with  Alzheimer's) Available Help at Discharge: Family(son close and one in [place]) Type of Home: Independent living facility([name]) Home Access: Elevator     Home Layout: One level Home Equipment: [place] - 2 wheels;Cane - single point;Wheelchair - manual Additional Comments: usually walks short distances in her apartment, recently, more in w/c    Prior Function Level of Independence: Independent         Comments: does not cook goes to dining room at [employer]        Extremity/Trunk Assessment   Upper Extremity Assessment Upper Extremity Assessment: Generalized weakness    Lower Extremity Assessment Lower Extremity Assessment: Generalized weakness       Communication   Communication: No difficulties  Cognition Arousal/Alertness: Awake/alert Behavior During Therapy: WFL for tasks assessed/performed Overall Cognitive Status: Within Functional Limits for tasks assessed                                        General Comments General comments (skin integrity, edema, etc.): States thinks her spouse may be getting what she has.  Feels she can return to ILF with HHPT, but not to take care of her sick spouse (one of [place] would have to go to higher level of care)    Exercises     Assessment/Plan    PT Assessment Patient  needs continued PT services  PT Problem List Decreased strength;Decreased mobility;Decreased balance;Decreased activity tolerance;Decreased knowledge of use of DME;Cardiopulmonary status limiting activity       PT Treatment Interventions DME instruction;Therapeutic activities;Gait training;Therapeutic exercise;Patient/family education;Balance training;Functional mobility training;Wheelchair mobility training    PT Goals (Current goals can be found in the Care Plan section)  Acute Rehab PT Goals Patient Stated Goal: To go home PT Goal Formulation: With patient Time For Goal Achievement: 07/11/17 Potential to  Achieve Goals: Good    Frequency Min 3X/week   Barriers to discharge        Co-evaluation               AM-PAC PT "6 Clicks" Daily Activity  Outcome Measure Difficulty turning over in bed (including adjusting bedclothes, sheets and blankets)?: None Difficulty moving from lying on back to sitting on the side of the bed? : A Little Difficulty sitting down on and standing up from a chair with arms (e.g., wheelchair, bedside commode, etc,.)?: Unable Help needed moving to and from a bed to chair (including a wheelchair)?: A Little Help needed walking in hospital room?: A Little Help needed climbing 3-5 steps with a railing? : A Lot 6 Click Score: 16    End of Session Equipment Utilized During Treatment: Gait belt;Oxygen Activity Tolerance: Patient limited by fatigue;Treatment limited secondary to medical complications (Comment)(hypoxia on O2) Patient left: in chair;with chair alarm set;with call bell/phone within reach   PT Visit Diagnosis: Other abnormalities of gait and mobility (R26.89);Muscle weakness (generalized) (M62.81)    Time: 8546-2703 PT Time Calculation (min) (ACUTE ONLY): 29 min   Charges:   PT Evaluation $PT Eval Moderate Complexity: 1 Mod PT Treatments $Gait Training: 8-22 mins   PT G CodesMagda Sanders, [place] [phone] 06/27/2017   Cassidy Sanders 06/27/2017, 4:26 PM

## 2017-06-27 NOTE — Progress Notes (Signed)
PROGRESS NOTE    Cassidy Sanders  WJX:[id number] DOB: 23-Jan-1941 DOA: 06/25/2017 PCP: [name], MD   Brief Narrative: Cassidy Sanders is a 77 y.o. female with a history of metastatic non-small cell carcinoma, hypertension. She presented with diarrhea and found to be dehydrated. Diarrhea has improved with initiation of metronidazole. Patient with decreased breath sounds, chest x-ray pending. PT eval pending.   Assessment & Plan:   Principal Problem:   Diarrhea Active Problems:   Essential hypertension   Physical deconditioning   Malignant neoplasm of posterior mediastinum (HCC)   Diarrhea Gastroenteritis. Concern for bacterial on admission. Stool studies never sent per patient. Possibly viral as her husband now has similar symptoms. -Continue Metronidazole -Advance diet today -PT eval  Essential hypertension Well controlled -Continue Metoprolol  Metastatic non-small cell carcinoma Patient currently under the care of Dr. Julien Nordmann and Dr. Lisbeth Renshaw. Patient has been treated with systemic chemotherapy in addition to palliative radiotherapy. Last cycle of chemotherapy (Keytruda, Taxol and Paraplatin) was 06/12/17  Diminished breath sounds Patient with a history of lung mass. Has had pleural effusions in the past. Slightly dyspnea which is worsened from baseline. -Chest x-ray  Chronic respiratory failure with hypoxia Secondary to lung cancer. Stable. -Continue oxygen   DVT prophylaxis: Lovenox Code Status:   Code Status: Full Code Family Communication: None at bedside Disposition Plan: Discharge likely in 24-48 hours pending workup of diarrhea and dyspnea   Consultants:   None  Procedures:   None  Antimicrobials:  Metronidazole    Subjective: Some mild dyspnea above baseline with productive sputum which is slightly increased.feels weak.  Objective: Vitals:   06/26/17 1959 06/27/17 0525 06/27/17 0559 06/27/17 0624  BP: 130/73 127/77    Pulse: (!) 104 93      Resp: 18 17    Temp: 98.6 F (37 C) 98.2 F (36.8 C)    TempSrc: Oral Oral    SpO2: 93% 92% 93%   Weight:    75.2 kg (165 lb 12.6 oz)  Height:        Intake/Output Summary (Last 24 hours) at 06/27/2017 0715 Last data filed at 06/27/2017 0600 Gross per 24 hour  Intake 1963.33 ml  Output 7 ml  Net 1956.33 ml   Filed Weights   06/25/17 2322 06/27/17 0624  Weight: 77.7 kg (171 lb 3 oz) 75.2 kg (165 lb 12.6 oz)    Examination:  General exam: Appears calm and comfortable Respiratory system: Diminished breath sounds on right with left sided wheeezing. Respiratory effort normal. On oxygen via nasal canula Cardiovascular system: S1 & S2 heard, RRR. No murmurs, rubs, gallops or clicks. Gastrointestinal system: Abdomen is nondistended, soft and nontender. No organomegaly or masses felt. Normal bowel sounds heard. Central nervous system: Alert and oriented. No focal neurological deficits. Extremities: No edema. No calf tenderness Skin: No cyanosis. No rashes Psychiatry: Judgement and insight appear normal. Mood & affect appropriate.     Data Reviewed: I have personally reviewed following labs and imaging studies  CBC: Recent Labs  Lab 06/26/17 0052 06/26/17 1322  WBC 1.5* 2.6*  NEUTROABS 0.8*  --   HGB 9.1* 9.5*  HCT 29.9* 30.7*  MCV 84.7 85.5  PLT 182 213   Basic Metabolic Panel: Recent Labs  Lab 06/26/17 0052 06/26/17 1322  NA 138 138  K 3.3* 3.6  CL 106 104  CO2 24 25  GLUCOSE 89 101*  BUN 18 13  CREATININE 0.61 0.57  CALCIUM 8.2* 8.1*   GFR: Estimated Creatinine Clearance: 60.6 mL/min (  by C-G formula based on SCr of 0.57 mg/dL). Liver Function Tests: Recent Labs  Lab 06/26/17 0052 06/26/17 1322  AST 21 27  ALT 16 18  ALKPHOS 76 81  BILITOT 0.7 0.7  PROT 5.9* 6.3*  ALBUMIN 2.6* 2.7*   No results for input(s): LIPASE, AMYLASE in the last 168 hours. No results for input(s): AMMONIA in the last 168 hours. Coagulation Profile: No results for  input(s): INR, PROTIME in the last 168 hours. Cardiac Enzymes: No results for input(s): CKTOTAL, CKMB, CKMBINDEX, TROPONINI in the last 168 hours. BNP (last 3 results) No results for input(s): PROBNP in the last 8760 hours. HbA1C: No results for input(s): HGBA1C in the last 72 hours. CBG: No results for input(s): GLUCAP in the last 168 hours. Lipid Profile: No results for input(s): CHOL, HDL, LDLCALC, TRIG, CHOLHDL, LDLDIRECT in the last 72 hours. Thyroid Function Tests: Recent Labs    06/26/17 0052  TSH 2.852   Anemia Panel: No results for input(s): VITAMINB12, FOLATE, FERRITIN, TIBC, IRON, RETICCTPCT in the last 72 hours. Sepsis Labs: No results for input(s): PROCALCITON, LATICACIDVEN in the last 168 hours.  No results found for this or any previous visit (from the past 240 hour(s)).       Radiology Studies: No results found.      Scheduled Meds: . albuterol  2.5 mg Nebulization TID  . enoxaparin (LOVENOX) injection  40 mg Subcutaneous Q24H  . metoprolol tartrate  50 mg Oral BID  . sertraline  50 mg Oral Daily   Continuous Infusions: . lactated ringers 100 mL/hr at 06/26/17 1322  . metronidazole 500 mg (06/27/17 0543)     LOS: 1 day     Cordelia Poche, MD [facility] 06/27/2017, 7:15 AM Pager: [phone]  If 7PM-7AM, please contact night-coverage [email or web address] Password [facility] 06/27/2017, 7:15 AM

## 2017-06-28 LAB — CBC
HCT: 30.2 % — ABNORMAL LOW (ref 36.0–46.0)
Hemoglobin: 9.4 g/dL — ABNORMAL LOW (ref 12.0–15.0)
MCH: 26.2 pg (ref 26.0–34.0)
MCHC: 31.1 g/dL (ref 30.0–36.0)
MCV: 84.1 fL (ref 78.0–100.0)
PLATELETS: 204 10*3/uL (ref 150–400)
RBC: 3.59 MIL/uL — AB (ref 3.87–5.11)
RDW: 17.7 % — ABNORMAL HIGH (ref 11.5–15.5)
WBC: 5.7 10*3/uL (ref 4.0–10.5)

## 2017-06-28 LAB — BASIC METABOLIC PANEL
Anion gap: 9 (ref 5–15)
BUN: 7 mg/dL (ref 6–20)
CALCIUM: 8.5 mg/dL — AB (ref 8.9–10.3)
CO2: 27 mmol/L (ref 22–32)
CREATININE: 0.49 mg/dL (ref 0.44–1.00)
Chloride: 105 mmol/L (ref 101–111)
Glucose, Bld: 102 mg/dL — ABNORMAL HIGH (ref 65–99)
Potassium: 3.1 mmol/L — ABNORMAL LOW (ref 3.5–5.1)
SODIUM: 141 mmol/L (ref 135–145)

## 2017-06-28 LAB — APTT: aPTT: 34 seconds (ref 24–36)

## 2017-06-28 LAB — PROTIME-INR
INR: 1.21
PROTHROMBIN TIME: 15.2 s (ref 11.4–15.2)

## 2017-06-28 MED ORDER — METRONIDAZOLE 500 MG PO TABS
500.0000 mg | ORAL_TABLET | Freq: Three times a day (TID) | ORAL | Status: DC
  Administered 2017-06-28 – 2017-07-06 (×23): 500 mg via ORAL
  Filled 2017-06-28 (×24): qty 1

## 2017-06-28 MED ORDER — POTASSIUM CHLORIDE CRYS ER 10 MEQ PO TBCR
40.0000 meq | EXTENDED_RELEASE_TABLET | Freq: Once | ORAL | Status: AC
  Administered 2017-06-28: 40 meq via ORAL
  Filled 2017-06-28: qty 4

## 2017-06-28 NOTE — Progress Notes (Signed)
PROGRESS NOTE    Cassidy Sanders  UXN:[id number] DOB: [birth year] DOA: 06/25/2017 PCP: [name], MD   Brief Narrative: Cassidy Sanders is a 77 y.o. female with a history of metastatic non-small cell carcinoma, hypertension. She presented with diarrhea and found to be dehydrated. Diarrhea has improved with initiation of metronidazole. Patient with decreased breath sounds, chest x-ray pending. PT eval pending.   Assessment & Plan:   Principal Problem:   Diarrhea Active Problems:   Essential hypertension   Physical deconditioning   Malignant neoplasm of posterior mediastinum (HCC)   Diarrhea Gastroenteritis. Concern for bacterial on admission. Stool studies never sent per patient. Husband positive for C. Difficile. Possible patient also arrived with C. Difficile, treated with metronidazole -Continue Metronidazole -PT eval: Home health PT  Essential hypertension Well controlled -Continue Metoprolol  Metastatic non-small cell carcinoma Patient currently under the care of Dr. Julien Nordmann and Dr. Lisbeth Renshaw. Patient has been treated with systemic chemotherapy in addition to palliative radiotherapy. Last cycle of chemotherapy (Keytruda, Taxol and Paraplatin) was 06/12/17  Large right sided effusion Patient with a history of lung mass. Has had pleural effusions in the past. Worsened dyspnea. Concern for consolidation vs collapsed lung on CT. -Pulmonology recommendations  Acute on chronic respiratory failure with hypoxia Secondary to lung cancer and recurrent effusion. -Continue oxygen therapy  Hypokalemia -potassium supplementation.   DVT prophylaxis: Lovenox Code Status:   Code Status: Full Code Family Communication: None at bedside Disposition Plan: Discharge likely in 48 hours pending workup of dyspnea   Consultants:   Pulmonology  Procedures:   None  Antimicrobials:  Metronidazole    Subjective: Dyspnea worsened overnight.   Objective: Vitals:   06/27/17 2100  06/28/17 0611 06/28/17 0632 06/28/17 0812  BP: (!) 111/57 (!) 154/91    Pulse: (!) 108 (!) 119    Resp: 19 17    Temp: 98.7 F (37.1 C) (!) 97.5 F (36.4 C)    TempSrc: Oral Oral    SpO2: 90% (!) 89% 94% (!) 89%  Weight:   78.4 kg (172 lb 13.5 oz)   Height:        Intake/Output Summary (Last 24 hours) at 06/28/2017 1100 Last data filed at 06/28/2017 2542 Gross per 24 hour  Intake 300 ml  Output 3 ml  Net 297 ml   Filed Weights   06/25/17 2322 06/27/17 0624 06/28/17 7062  Weight: 77.7 kg (171 lb 3 oz) 75.2 kg (165 lb 12.6 oz) 78.4 kg (172 lb 13.5 oz)    Examination:  General exam: Appears calm and comfortable Respiratory system: Diminished right sided breath sounds with clear breath sounds on left except for rales on anterior auscultation. Respiratory effort normal. On oxygen via nasal canula Cardiovascular system: S1 & S2 heard, Tachycardia, regular rhythm. No murmurs, rubs, gallops or clicks. Gastrointestinal system: Soft, non-tender, non-distended, no guarding, no rebound, no masses felt Central nervous system: Alert, oriented. No focal deficit. Extremities: No edema or calf tenderness. Skin: No cyanosis. No rashes Psychiatry: Judgment/insight good. Normal affect and mood.    Data Reviewed: I have personally reviewed following labs and imaging studies  CBC: Recent Labs  Lab 06/26/17 0052 06/26/17 1322 06/28/17 0605  WBC 1.5* 2.6* 5.7  NEUTROABS 0.8*  --   --   HGB 9.1* 9.5* 9.4*  HCT 29.9* 30.7* 30.2*  MCV 84.7 85.5 84.1  PLT 182 208 376   Basic Metabolic Panel: Recent Labs  Lab 06/26/17 0052 06/26/17 1322 06/28/17 0605  NA 138 138 141  K 3.3*  3.6 3.1*  CL 106 104 105  CO2 24 25 27   GLUCOSE 89 101* 102*  BUN 18 13 7   CREATININE 0.61 0.57 0.49  CALCIUM 8.2* 8.1* 8.5*   GFR: Estimated Creatinine Clearance: 60.6 mL/min (by C-G formula based on SCr of 0.49 mg/dL). Liver Function Tests: Recent Labs  Lab 06/26/17 0052 06/26/17 1322  AST 21 27  ALT  16 18  ALKPHOS 76 81  BILITOT 0.7 0.7  PROT 5.9* 6.3*  ALBUMIN 2.6* 2.7*   No results for input(s): LIPASE, AMYLASE in the last 168 hours. No results for input(s): AMMONIA in the last 168 hours. Coagulation Profile: Recent Labs  Lab 06/28/17 0605  INR 1.21   Cardiac Enzymes: No results for input(s): CKTOTAL, CKMB, CKMBINDEX, TROPONINI in the last 168 hours. BNP (last 3 results) No results for input(s): PROBNP in the last 8760 hours. HbA1C: No results for input(s): HGBA1C in the last 72 hours. CBG: No results for input(s): GLUCAP in the last 168 hours. Lipid Profile: No results for input(s): CHOL, HDL, LDLCALC, TRIG, CHOLHDL, LDLDIRECT in the last 72 hours. Thyroid Function Tests: Recent Labs    06/26/17 0052  TSH 2.852   Anemia Panel: No results for input(s): VITAMINB12, FOLATE, FERRITIN, TIBC, IRON, RETICCTPCT in the last 72 hours. Sepsis Labs: No results for input(s): PROCALCITON, LATICACIDVEN in the last 168 hours.  No results found for this or any previous visit (from the past 240 hour(s)).       Radiology Studies: Ct Chest W Contrast  Result Date: 06/27/2017 CLINICAL DATA:  Known right hilar non-small-cell lung cancer, hypertension, dehydration, decreased breath sounds, pleural effusion EXAM: CT CHEST WITH CONTRAST TECHNIQUE: Multidetector CT imaging of the chest was performed during intravenous contrast administration. CONTRAST:  39mL OMNIPAQUE IOHEXOL 300 MG/ML  SOLN COMPARISON:  06/27/2017 chest x-ray, 03/12/2017 PET-CT, 01/31/2017 chest CT FINDINGS: Cardiovascular: Atherosclerosis of the major branch vessels and aorta without aneurysm, dissection, mediastinal hemorrhage/hematoma. Central pulmonary arteries appear patent. Native coronary atherosclerosis noted. Normal heart size. No pericardial effusion. Mediastinum/Nodes: Known right hilar/mediastinal lung mass is obscured by the complete right lung collapse/consolidation related to the large right pleural  effusion. Small inferior right thyroid nodules noted. Trachea and left central airways are patent. Right mainstem bronchus and bronchus intermedius appear occluded centrally. No significant or bulky adenopathy demonstrated. Lungs/Pleura: Ill-defined right hilar/mediastinal mass again obscured by right lung complete collapse/consolidation and the large effusion on the right. Trace left pleural effusion dependently. Anterior left upper lobe patchy airspace process with central air bronchograms may represent infectious/inflammatory process or pneumonia the left upper lobe. Minor lingula and left basilar atelectasis. Upper Abdomen: Stable bilateral adrenal nodules, previously demonstrated to be adenomas by PET-CT. Abdominal atherosclerosis noted. No acute upper abdominal finding. Musculoskeletal: Degenerative changes noted of the spine. Endplate osteophytes throughout. Chronic appearing compression fracture at T12. No definite focal osseous abnormality. Sternum intact. IMPRESSION: Recurrent large right pleural effusion with complete right lung collapse/consolidation now obscuring the known right hilar/mediastinal lung mass. Anterior left upper lobe patchy airspace process with central air bronchograms concerning for left upper lobe pneumonia Minor lingula and left lower lobe atelectasis and trace left effusion. Aortic atherosclerosis and native coronary atherosclerosis Aortic aneurysm NOS (ICD10-I71.9). Electronically Signed   By: Jerilynn Mages.  Shick M.D.   On: 06/27/2017 17:13   Dg Chest Port 1 View  Result Date: 06/27/2017 CLINICAL DATA:  77 year old female with metastatic non-small cell lung cancer. Shortness of breath for 2 days and diarrhea. Dehydrated, tachycardia. EXAM: PORTABLE CHEST 1  VIEW COMPARISON:  05/17/2017 radiographs and earlier. FINDINGS: Portable AP upright view at 1253 hours. New virtually complete whiteout of the right hemithorax. Minimal aerated lung suspected about the right hilum. Abrupt cut off of the  right mainstem bronchus. No definite mediastinal shift since March. Stable left lung. Stable left mediastinal contours. No acute osseous abnormality identified. IMPRESSION: 1. New nonspecific white out of the right hemithorax. This could reflect a combination of collapse, consolidation, and pleural effusion. Chest CT (IV contrast preferred) would best characterize further. 2. Stable left lung. Electronically Signed   By: Genevie Ann M.D.   On: 06/27/2017 13:27        Scheduled Meds: . albuterol  2.5 mg Nebulization TID  . enoxaparin (LOVENOX) injection  40 mg Subcutaneous Q24H  . metoprolol tartrate  50 mg Oral BID  . potassium chloride  40 mEq Oral Once  . sertraline  50 mg Oral Daily   Continuous Infusions: . metronidazole Stopped (06/28/17 0754)     LOS: 2 days     Cordelia Poche, MD [facility] 06/28/2017, 11:00 AM Pager: [phone]  If 7PM-7AM, please contact night-coverage [email or web address] Password [email or web address] 06/28/2017, 11:00 AM

## 2017-06-28 NOTE — Consult Note (Signed)
PULMONARY / CRITICAL CARE MEDICINE   Name: Cassidy Sanders MRN: [id number] DOB: [birth year]    ADMISSION DATE:  06/25/2017 CONSULTATION DATE:  06/29/2017  REFERRING MD:  Lonny Prude - [facility]  CHIEF COMPLAINT: Right pleural effusion.  HISTORY OF PRESENT ILLNESS:   77 year old woman with a history of non-small cell lung cancer.  She was admitted with protracted diarrhea and was found to have a C. difficile colitis.  Diarrhea has now settled on treatment.  She also complained of increasing shortness of breath and is known to have a right pleural effusion previously seen by Dr. Lake Bells and which had previously been tapped approximately 3 weeks ago.  On presentation this time however the effusion now filled her right hemithorax completely.  PAST MEDICAL HISTORY :  She  has a past medical history of Brain tumor (benign) ([place]), Depression, and Hypertension.  PAST SURGICAL HISTORY: She  has a past surgical history that includes Abdominal hysterectomy; Bladder surgery; Brain surgery; Endobronchial ultrasound (Bilateral, 02/06/2017); and Video bronchoscopy with endobronchial ultrasound (N/A, 03/23/2017).  Allergies  Allergen Reactions  . Latex Dermatitis  . Gabapentin Other (See Comments)    Excessive sleep   . Tape Rash    No current facility-administered medications on file prior to encounter.    Current Outpatient Medications on File Prior to Encounter  Medication Sig  . albuterol (PROVENTIL HFA;VENTOLIN HFA) 108 (90 Base) MCG/ACT inhaler Inhale 2 puffs into the lungs every 4 (four) hours as needed for wheezing or shortness of breath.  . Biotin 2.5 MG TABS Take 2,500 mcg by mouth daily.  . hydrochlorothiazide (MICROZIDE) 12.5 MG capsule Take 12.5 mg by mouth daily.  Marland Kitchen loperamide (IMODIUM) 2 MG capsule Take 2 mg by mouth as needed for diarrhea or loose stools.  . metoprolol tartrate (LOPRESSOR) 50 MG tablet Take 0.5 tablets (25 mg total) by mouth 2 (two) times daily. (Patient taking  differently: Take 50 mg by mouth daily. )  . sertraline (ZOLOFT) 50 MG tablet Take 50 mg by mouth daily.  . chlorpheniramine-HYDROcodone (TUSSIONEX) 10-8 MG/5ML SUER Take 5 mLs by mouth every 12 (twelve) hours as needed for cough. (Patient not taking: Reported on 06/26/2017)  . Lactulose 20 GM/30ML SOLN Take 30 mLs (20 g total) by mouth 4 (four) times daily as needed. (Patient not taking: Reported on 06/12/2017)  . prochlorperazine (COMPAZINE) 10 MG tablet Take 1 tablet (10 mg total) by mouth every 6 (six) hours as needed for nausea or vomiting. (Patient not taking: Reported on 06/26/2017)    FAMILY HISTORY:  Her has no family status information on file.    SOCIAL HISTORY: She  reports that she quit smoking about 5 years ago. Her smoking use included cigarettes. She has a 59.00 pack-year smoking history. She has never used smokeless tobacco. She reports that she drinks alcohol. She reports that she does not use drugs.  REVIEW OF SYSTEMS:   Review of Systems  Constitutional: Positive for weight loss.  HENT: Negative.   Eyes: Negative.   Respiratory: Positive for cough and shortness of breath.   Cardiovascular: Negative.   Gastrointestinal: Negative.   Genitourinary: Negative.   Musculoskeletal: Negative.   Skin: Negative.   Endo/Heme/Allergies: Negative.   Psychiatric/Behavioral: Negative.     SUBJECTIVE:  She currently feels short of breath, but her dyspnea is tolerable.  VITAL SIGNS: BP (!) 151/107 (BP Location: Right Arm)   Pulse (!) 105   Temp 98.4 F (36.9 C) (Oral)   Resp 17   Ht 5\' 9"  (  1.753 m)   Wt 172 lb 13.5 oz (78.4 kg)   SpO2 92%   BMI 25.52 kg/m       INTAKE / OUTPUT: I/O last 3 completed shifts: In: 2240 [P.O.:240; I.V.:1500; IV Piggyback:500] Out: 5 [Urine:4; Stool:1]  PHYSICAL EXAMINATION: General: Thin woman in mild respiratory distress, but able to speak in full sentences. Neuro: Alert and oriented.  No focal neurological deficits HEENT:  Chemotherapy-induced alopecia.  Scleral pallor.  No palpable lymphadenopathy. Cardiovascular: First and second heart sounds are unremarkable.  No edema.  JVP not elevated. Lungs: The left hemithorax is clear with no adventitial sounds.  Breath sounds are essentially absent on the right side with stony dullness to percussion. Abdomen: Abdomen is soft and nontender. Musculoskeletal: There are no active joints Skin: Skin is intact  LABS:  BMET Recent Labs  Lab 06/26/17 0052 06/26/17 1322 06/28/17 0605  NA 138 138 141  K 3.3* 3.6 3.1*  CL 106 104 105  CO2 24 25 27   BUN 18 13 7   CREATININE 0.61 0.57 0.49  GLUCOSE 89 101* 102*    Electrolytes Recent Labs  Lab 06/26/17 0052 06/26/17 1322 06/28/17 0605  CALCIUM 8.2* 8.1* 8.5*    CBC Recent Labs  Lab 06/26/17 0052 06/26/17 1322 06/28/17 0605  WBC 1.5* 2.6* 5.7  HGB 9.1* 9.5* 9.4*  HCT 29.9* 30.7* 30.2*  PLT 182 208 204    Coag's Recent Labs  Lab 06/28/17 0605  APTT 34  INR 1.21    Sepsis Markers No results for input(s): LATICACIDVEN, PROCALCITON, O2SATVEN in the last 168 hours.  ABG No results for input(s): PHART, PCO2ART, PO2ART in the last 168 hours.  Liver Enzymes Recent Labs  Lab 06/26/17 0052 06/26/17 1322  AST 21 27  ALT 16 18  ALKPHOS 76 81  BILITOT 0.7 0.7  ALBUMIN 2.6* 2.7*    Cardiac Enzymes No results for input(s): TROPONINI, PROBNP in the last 168 hours.  Glucose No results for input(s): GLUCAP in the last 168 hours.  Imaging  CT scan performed on 4-17 showsRecurrent large right pleural effusion with complete right lung collapse/consolidation now obscuring the known right hilar/mediastinal lung mass. Anterior left upper lobe patchy airspace process with central air bronchograms concerning for left upper lobe pneumonia Minor lingula and left lower lobe atelectasis and trace left  Effusion. Aortic atherosclerosis and native coronary atherosclerosis  DISCUSSION: This 77 year old lady  has recurrent large pleural effusion undoubtedly related to her non-small cell lung cancer.  Negative fluid cytology on a previous thoracentesis likely represents a sampling error.  In any event, the patient is symptomatic with this large effusion and requires drainage.  It is shown itself to be recurrent and would likely be best managed with a Pleurx catheter.  Patient however is unwilling to have a permanent drain placed at this time, but would rather try a repeat small bore chest tube drainage.  ASSESSMENT / PLAN:  I have a interventional radiology request for a small bore (pigtail) chest tube to be placed.  He has too much fluid to be drained all at once, and I would recommend drain no more than 1-1.5 L/day.  The effusion may drain by gravity alone, and the tube should remain in until such time as effusion has completely been evacuated.  I would then recommend that she be discharged with follow-up with Dr. Lake Bells in the office.  She has told me that if her effusion were to recur a third time, that she would then be amenable  to Pleurx catheter placement.  We will continue to follow this patient with you while she is in hospital.  Thank you for the opportunity to contribute to her care with you.  Kipp Brood, MD. Pulmonary and [place] Pager: [phone]  06/28/2017, 4:47 PM

## 2017-06-28 NOTE — Progress Notes (Signed)
PT Cancellation Note  Patient Details Name: Cassidy Sanders MRN: [id number] DOB: 02-22-1941   Cancelled Treatment:     pt declined stated "I'm a hot mess"  C/o dyspnea and fatigue from freq bathroom trips.  Pt has been evaluated with rec for Home Health at her [facility].    Rica Koyanagi  PTA [facility]  Acute  Rehab Pager      [phone]

## 2017-06-29 ENCOUNTER — Inpatient Hospital Stay (HOSPITAL_COMMUNITY): Payer: Medicare Other

## 2017-06-29 ENCOUNTER — Encounter (HOSPITAL_COMMUNITY): Payer: Self-pay | Admitting: Interventional Radiology

## 2017-06-29 HISTORY — PX: IR GUIDED DRAIN W CATHETER PLACEMENT: IMG719

## 2017-06-29 HISTORY — PX: IR US GUIDE BX ASP/DRAIN: IMG2392

## 2017-06-29 LAB — BODY FLUID CELL COUNT WITH DIFFERENTIAL
EOS FL: 0 %
Lymphs, Fluid: 67 %
MONOCYTE-MACROPHAGE-SEROUS FLUID: 9 % — AB (ref 50–90)
Neutrophil Count, Fluid: 24 % (ref 0–25)
WBC FLUID: 54 uL (ref 0–1000)

## 2017-06-29 LAB — LACTATE DEHYDROGENASE, PLEURAL OR PERITONEAL FLUID: LD FL: 84 U/L — AB (ref 3–23)

## 2017-06-29 LAB — PROTEIN, PLEURAL OR PERITONEAL FLUID: TOTAL PROTEIN, FLUID: 3 g/dL

## 2017-06-29 MED ORDER — FENTANYL CITRATE (PF) 100 MCG/2ML IJ SOLN
INTRAMUSCULAR | Status: AC
Start: 2017-06-29 — End: 2017-06-30
  Filled 2017-06-29: qty 2

## 2017-06-29 MED ORDER — CEFAZOLIN SODIUM-DEXTROSE 2-4 GM/100ML-% IV SOLN
2.0000 g | INTRAVENOUS | Status: AC
  Administered 2017-06-29: 2 g via INTRAVENOUS

## 2017-06-29 MED ORDER — ENOXAPARIN SODIUM 40 MG/0.4ML ~~LOC~~ SOLN
40.0000 mg | SUBCUTANEOUS | Status: DC
  Administered 2017-06-29: 40 mg via SUBCUTANEOUS
  Filled 2017-06-29: qty 0.4

## 2017-06-29 MED ORDER — LIDOCAINE-EPINEPHRINE (PF) 2 %-1:200000 IJ SOLN
INTRAMUSCULAR | Status: AC | PRN
  Administered 2017-06-29: 20 mL

## 2017-06-29 MED ORDER — FENTANYL CITRATE (PF) 100 MCG/2ML IJ SOLN
INTRAMUSCULAR | Status: AC | PRN
  Administered 2017-06-29: 50 ug via INTRAVENOUS

## 2017-06-29 MED ORDER — MIDAZOLAM HCL 2 MG/2ML IJ SOLN
INTRAMUSCULAR | Status: AC | PRN
  Administered 2017-06-29: 1 mg via INTRAVENOUS

## 2017-06-29 MED ORDER — CEFAZOLIN SODIUM-DEXTROSE 2-4 GM/100ML-% IV SOLN
INTRAVENOUS | Status: AC
  Administered 2017-06-29: 2 g via INTRAVENOUS
  Filled 2017-06-29: qty 100

## 2017-06-29 MED ORDER — MIDAZOLAM HCL 2 MG/2ML IJ SOLN
INTRAMUSCULAR | Status: AC
  Filled 2017-06-29: qty 2

## 2017-06-29 NOTE — Progress Notes (Addendum)
Referring Physician(s): Agarwala,R  Supervising Physician: Arne Cleveland  Patient Status:  [facility] - In-pt  Chief Complaint:  Dyspnea, lung cancer, recurrent right pleural effusion  Subjective: Patient familiar to IR service from prior right thoracentesis on 05/11/17 yielding 1.3 L of fluid.  Pathology revealed atypical cells.  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  She also has recurrent symptomatic right pleural effusion.  She was recently admitted to the hospital on 4/16 with diarrhea and dehydration.  Patient's husband had recent C. difficile.  Request now received from critical care/pulmonary for right Pleurx catheter placement.  Past Medical History:  Diagnosis Date  . Brain tumor (benign) ([place])   . Depression   . Hypertension    Past Surgical History:  Procedure Laterality Date  . ABDOMINAL HYSTERECTOMY    . BLADDER SURGERY    . BRAIN SURGERY    . ENDOBRONCHIAL ULTRASOUND Bilateral 02/06/2017   Procedure: ENDOBRONCHIAL ULTRASOUND;  Surgeon: Juanito Doom, MD;  Location: [facility] ENDOSCOPY;  Service: Cardiopulmonary;  Laterality: Bilateral;  . VIDEO BRONCHOSCOPY WITH ENDOBRONCHIAL ULTRASOUND N/A 03/23/2017   Procedure: VIDEO BRONCHOSCOPY WITH ENDOBRONCHIAL ULTRASOUND;  Surgeon: Juanito Doom, MD;  Location: [facility] OR;  Service: Thoracic;  Laterality: N/A;    Allergies: Latex; Gabapentin; and Tape  Medications: Prior to Admission medications   Medication Sig Start Date End Date Taking? Authorizing Provider  albuterol (PROVENTIL HFA;VENTOLIN HFA) 108 (90 Base) MCG/ACT inhaler Inhale 2 puffs into the lungs every 4 (four) hours as needed for wheezing or shortness of breath. 05/01/17  Yes Tanner, Lyndon Code., PA-C  Biotin 2.5 MG TABS Take 2,500 mcg by mouth daily.   Yes [provider]  hydrochlorothiazide (MICROZIDE) 12.5 MG capsule Take 12.5 mg by mouth daily. 04/28/17  Yes [provider]    loperamide (IMODIUM) 2 MG capsule Take 2 mg by mouth as needed for diarrhea or loose stools.   Yes [provider]  metoprolol tartrate (LOPRESSOR) 50 MG tablet Take 0.5 tablets (25 mg total) by mouth 2 (two) times daily. Patient taking differently: Take 50 mg by mouth daily.  02/12/17 02/12/18 Yes Erick Colace, NP  sertraline (ZOLOFT) 50 MG tablet Take 50 mg by mouth daily.   Yes [provider]  chlorpheniramine-HYDROcodone (TUSSIONEX) 10-8 MG/5ML SUER Take 5 mLs by mouth every 12 (twelve) hours as needed for cough. Patient not taking: Reported on 06/26/2017 03/23/17   Juanito Doom, MD  Lactulose 20 GM/30ML SOLN Take 30 mLs (20 g total) by mouth 4 (four) times daily as needed. Patient not taking: Reported on 06/12/2017 05/04/17   Maryanna Shape, NP  prochlorperazine (COMPAZINE) 10 MG tablet Take 1 tablet (10 mg total) by mouth every 6 (six) hours as needed for nausea or vomiting. Patient not taking: Reported on 06/26/2017 04/17/17   Curt Bears, MD     Vital Signs: BP (!) 149/81 (BP Location: Right Arm)   Pulse (!) 123   Temp 98.8 F (37.1 C) (Oral)   Resp 18   Ht 5\' 9"  (1.753 m)   Wt 172 lb 13.5 oz (78.4 kg)   SpO2 94%   BMI 25.52 kg/m   Physical Exam awake, alert.  Chest with diminished breath sounds on the right, left clear.  Heart with tachycardic rate, occ ectopy.  Abdomen soft, positive bowel sounds, nontender.  No lower extremity edema.  Imaging: Ct Chest W Contrast  Result Date: 06/27/2017 CLINICAL DATA:  Known  right hilar non-small-cell lung cancer, hypertension, dehydration, decreased breath sounds, pleural effusion EXAM: CT CHEST WITH CONTRAST TECHNIQUE: Multidetector CT imaging of the chest was performed during intravenous contrast administration. CONTRAST:  75mL OMNIPAQUE IOHEXOL 300 MG/ML  SOLN COMPARISON:  06/27/2017 chest x-ray, 03/12/2017 PET-CT, 01/31/2017 chest CT FINDINGS: Cardiovascular: Atherosclerosis of the major branch vessels and  aorta without aneurysm, dissection, mediastinal hemorrhage/hematoma. Central pulmonary arteries appear patent. Native coronary atherosclerosis noted. Normal heart size. No pericardial effusion. Mediastinum/Nodes: Known right hilar/mediastinal lung mass is obscured by the complete right lung collapse/consolidation related to the large right pleural effusion. Small inferior right thyroid nodules noted. Trachea and left central airways are patent. Right mainstem bronchus and bronchus intermedius appear occluded centrally. No significant or bulky adenopathy demonstrated. Lungs/Pleura: Ill-defined right hilar/mediastinal mass again obscured by right lung complete collapse/consolidation and the large effusion on the right. Trace left pleural effusion dependently. Anterior left upper lobe patchy airspace process with central air bronchograms may represent infectious/inflammatory process or pneumonia the left upper lobe. Minor lingula and left basilar atelectasis. Upper Abdomen: Stable bilateral adrenal nodules, previously demonstrated to be adenomas by PET-CT. Abdominal atherosclerosis noted. No acute upper abdominal finding. Musculoskeletal: Degenerative changes noted of the spine. Endplate osteophytes throughout. Chronic appearing compression fracture at T12. No definite focal osseous abnormality. Sternum intact. IMPRESSION: Recurrent large right pleural effusion with complete right lung collapse/consolidation now obscuring the known right hilar/mediastinal lung mass. Anterior left upper lobe patchy airspace process with central air bronchograms concerning for left upper lobe pneumonia Minor lingula and left lower lobe atelectasis and trace left effusion. Aortic atherosclerosis and native coronary atherosclerosis Aortic aneurysm NOS (ICD10-I71.9). Electronically Signed   By: Jerilynn Mages.  Shick M.D.   On: 06/27/2017 17:13   Dg Chest Port 1 View  Result Date: 06/27/2017 CLINICAL DATA:  77 year old female with metastatic  non-small cell lung cancer. Shortness of breath for 2 days and diarrhea. Dehydrated, tachycardia. EXAM: PORTABLE CHEST 1 VIEW COMPARISON:  05/17/2017 radiographs and earlier. FINDINGS: Portable AP upright view at 1253 hours. New virtually complete whiteout of the right hemithorax. Minimal aerated lung suspected about the right hilum. Abrupt cut off of the right mainstem bronchus. No definite mediastinal shift since March. Stable left lung. Stable left mediastinal contours. No acute osseous abnormality identified. IMPRESSION: 1. New nonspecific white out of the right hemithorax. This could reflect a combination of collapse, consolidation, and pleural effusion. Chest CT (IV contrast preferred) would best characterize further. 2. Stable left lung. Electronically Signed   By: Genevie Ann M.D.   On: 06/27/2017 13:27    Labs:  CBC: Recent Labs    05/17/17 1539  06/19/17 0958 06/26/17 0052 06/26/17 1322 06/28/17 0605  WBC 3.8*   < > 2.4* 1.5* 2.6* 5.7  HGB 11.7*  --   --  9.1* 9.5* 9.4*  HCT 36.9   < > 28.9* 29.9* 30.7* 30.2*  PLT 248   < > 198 182 208 204   < > = values in this interval not displayed.    COAGS: Recent Labs    02/05/17 1101 03/23/17 1059 06/28/17 0605  INR 0.99 1.00 1.21  APTT 25 32 34    BMP: Recent Labs    06/19/17 0958 06/26/17 0052 06/26/17 1322 06/28/17 0605  NA 142 138 138 141  K 4.3 3.3* 3.6 3.1*  CL 104 106 104 105  CO2 31* 24 25 27   GLUCOSE 114 89 101* 102*  BUN 14 18 13 7   CALCIUM 9.4 8.2* 8.1* 8.5*  CREATININE 0.70 0.61 0.57 0.49  GFRNONAA >60 >60 >60 >60  GFRAA >60 >60 >60 >60    LIVER FUNCTION TESTS: Recent Labs    06/12/17 0851 06/19/17 0958 06/26/17 0052 06/26/17 1322  BILITOT 0.4 0.5 0.7 0.7  AST 13 17 21 27   ALT 11 18 16 18   ALKPHOS 100 93 76 81  PROT 6.8 6.7 5.9* 6.3*  ALBUMIN 2.5* 2.8* 2.6* 2.7*    Assessment and Plan: Patient with history of metastatic non-small cell lung cancer with prior chemoradiation and now with recurrent  symptomatic right pleural effusion.  Request received for right Pleurx catheter placement.  Imaging studies have been reviewed by Dr. Vernard Gambles.  Details/risks of procedure, including but not limited to, internal bleeding, infection, pneumothorax, injury to adjacent structures discussed with patient with her understanding and consent.  Procedure tent scheduled for today.  Lovenox will need to be held until after procedure.   Electronically Signed: D. Rowe Robert, PA-C 06/29/2017, 10:13 AM   I spent a total of 25 minutes at the the patient's bedside AND on the patient's hospital floor or unit, greater than 50% of which was counseling/coordinating care for right Pleurx catheter placement    Patient ID: Cassidy Sanders, female   DOB: 1940/06/19, 77 y.o.   MRN: [id number]

## 2017-06-29 NOTE — Progress Notes (Signed)
MEDICATION-RELATED CONSULT NOTE   IR Procedure Consult - Anticoagulant/Antiplatelet PTA/Inpatient Med List Review by Pharmacist    Procedure: R chest tunneled PleurX catheter    Completed: 06/29/17 16:35  Post-Procedural bleeding risk per IR MD assessment:  Low  Antithrombotic medications on inpatient or PTA profile prior to procedure:   Lovenox 40mg  q24h    Recommended restart time per IR Post-Procedure Guidelines:    Day 0  (at least 4 hours or at next standard dose interval)     Other considerations:      Plan:    Resume Lovenox 40mg  sq q24h at 22:00 tonight  Peggyann Juba, PharmD, BCPS Pager: [phone] 06/29/2017 4:46 PM

## 2017-06-29 NOTE — Progress Notes (Signed)
PROGRESS NOTE    Cassidy Sanders  ASN:[id number] DOB: Dec 18, 1940 DOA: 06/25/2017 PCP: [name], MD   Brief Narrative: Cassidy Sanders is a 77 y.o. female with a history of metastatic non-small cell carcinoma, hypertension. She presented with diarrhea and found to be dehydrated. Diarrhea has improved with initiation of metronidazole. Patient with decreased breath sounds, chest x-ray pending. PT eval pending.   Assessment & Plan:   Principal Problem:   Diarrhea Active Problems:   Essential hypertension   Physical deconditioning   Malignant neoplasm of posterior mediastinum (HCC)   Diarrhea Gastroenteritis. Concern for bacterial on admission. Stool studies never sent per patient. Husband positive for C. Difficile. Possible patient also arrived with C. Difficile, treated with metronidazole -Continue Metronidazole -PT eval: Home health PT -wean to 2 L  Essential hypertension Slightly worse controlled over last 24 hours -Continue Metoprolol -Watch BPs  Metastatic non-small cell carcinoma Patient currently under the care of Dr. Julien Nordmann and Dr. Lisbeth Renshaw. Patient has been treated with systemic chemotherapy in addition to palliative radiotherapy. Last cycle of chemotherapy (Keytruda, Taxol and Paraplatin) was 06/12/17  Large right sided effusion Patient with a history of lung mass. Has had pleural effusions in the past. Worsened dyspnea. Concern for consolidation vs collapsed lung on CT. -Pulmonology recommendations: IR pigtail chest tube  Acute on chronic respiratory failure with hypoxia Secondary to lung cancer and recurrent effusion. -Continue oxygen therapy  Hypokalemia -potassium supplementation given   DVT prophylaxis: Lovenox Code Status:   Code Status: Full Code Family Communication: None at bedside Disposition Plan: Discharge likely in 24-72 hours pending drainage of pleural fluid   Consultants:   Pulmonology  Interventional radiology  Procedures:    None  Antimicrobials:  Metronidazole    Subjective: Dyspnea improved from yesterday  Objective: Vitals:   06/28/17 1956 06/28/17 2019 06/29/17 0227 06/29/17 0750  BP:  (!) 149/81    Pulse:  (!) 123    Resp:  18    Temp:  98.8 F (37.1 C)    TempSrc:  Oral    SpO2: 94% (!) 89% 92% 94%  Weight:      Height:        Intake/Output Summary (Last 24 hours) at 06/29/2017 0924 Last data filed at 06/28/2017 2011 Gross per 24 hour  Intake 240 ml  Output -  Net 240 ml   Filed Weights   06/25/17 2322 06/27/17 0624 06/28/17 1937  Weight: 77.7 kg (171 lb 3 oz) 75.2 kg (165 lb 12.6 oz) 78.4 kg (172 lb 13.5 oz)    Examination:  General exam: Appears calm and comfortable Respiratory system: Diminished breath sounds on right lung field improved slightly. Rales and wheezing on anterior auscultation of left upper lobe Cardiovascular system: Regular rate and rhythm. Normal S1 and S2. No heart murmurs present. No extra heart sounds Gastrointestinal system: Soft, non-tender, non-distended, no guarding, no rebound, no masses felt Central nervous system: Alert, oriented. No focal deficit. Extremities: No edema or calf tenderness. Skin: No cyanosis. No rashes Psychiatry: Judgment/insight good. Normal affect and mood.    Data Reviewed: I have personally reviewed following labs and imaging studies  CBC: Recent Labs  Lab 06/26/17 0052 06/26/17 1322 06/28/17 0605  WBC 1.5* 2.6* 5.7  NEUTROABS 0.8*  --   --   HGB 9.1* 9.5* 9.4*  HCT 29.9* 30.7* 30.2*  MCV 84.7 85.5 84.1  PLT 182 208 902   Basic Metabolic Panel: Recent Labs  Lab 06/26/17 0052 06/26/17 1322 06/28/17 0605  NA 138 138  141  K 3.3* 3.6 3.1*  CL 106 104 105  CO2 24 25 27   GLUCOSE 89 101* 102*  BUN 18 13 7   CREATININE 0.61 0.57 0.49  CALCIUM 8.2* 8.1* 8.5*   GFR: Estimated Creatinine Clearance: 60.6 mL/min (by C-G formula based on SCr of 0.49 mg/dL). Liver Function Tests: Recent Labs  Lab 06/26/17 0052  06/26/17 1322  AST 21 27  ALT 16 18  ALKPHOS 76 81  BILITOT 0.7 0.7  PROT 5.9* 6.3*  ALBUMIN 2.6* 2.7*   No results for input(s): LIPASE, AMYLASE in the last 168 hours. No results for input(s): AMMONIA in the last 168 hours. Coagulation Profile: Recent Labs  Lab 06/28/17 0605  INR 1.21   Cardiac Enzymes: No results for input(s): CKTOTAL, CKMB, CKMBINDEX, TROPONINI in the last 168 hours. BNP (last 3 results) No results for input(s): PROBNP in the last 8760 hours. HbA1C: No results for input(s): HGBA1C in the last 72 hours. CBG: No results for input(s): GLUCAP in the last 168 hours. Lipid Profile: No results for input(s): CHOL, HDL, LDLCALC, TRIG, CHOLHDL, LDLDIRECT in the last 72 hours. Thyroid Function Tests: No results for input(s): TSH, T4TOTAL, FREET4, T3FREE, THYROIDAB in the last 72 hours. Anemia Panel: No results for input(s): VITAMINB12, FOLATE, FERRITIN, TIBC, IRON, RETICCTPCT in the last 72 hours. Sepsis Labs: No results for input(s): PROCALCITON, LATICACIDVEN in the last 168 hours.  No results found for this or any previous visit (from the past 240 hour(s)).       Radiology Studies: Ct Chest W Contrast  Result Date: 06/27/2017 CLINICAL DATA:  Known right hilar non-small-cell lung cancer, hypertension, dehydration, decreased breath sounds, pleural effusion EXAM: CT CHEST WITH CONTRAST TECHNIQUE: Multidetector CT imaging of the chest was performed during intravenous contrast administration. CONTRAST:  65mL OMNIPAQUE IOHEXOL 300 MG/ML  SOLN COMPARISON:  06/27/2017 chest x-ray, 03/12/2017 PET-CT, 01/31/2017 chest CT FINDINGS: Cardiovascular: Atherosclerosis of the major branch vessels and aorta without aneurysm, dissection, mediastinal hemorrhage/hematoma. Central pulmonary arteries appear patent. Native coronary atherosclerosis noted. Normal heart size. No pericardial effusion. Mediastinum/Nodes: Known right hilar/mediastinal lung mass is obscured by the complete  right lung collapse/consolidation related to the large right pleural effusion. Small inferior right thyroid nodules noted. Trachea and left central airways are patent. Right mainstem bronchus and bronchus intermedius appear occluded centrally. No significant or bulky adenopathy demonstrated. Lungs/Pleura: Ill-defined right hilar/mediastinal mass again obscured by right lung complete collapse/consolidation and the large effusion on the right. Trace left pleural effusion dependently. Anterior left upper lobe patchy airspace process with central air bronchograms may represent infectious/inflammatory process or pneumonia the left upper lobe. Minor lingula and left basilar atelectasis. Upper Abdomen: Stable bilateral adrenal nodules, previously demonstrated to be adenomas by PET-CT. Abdominal atherosclerosis noted. No acute upper abdominal finding. Musculoskeletal: Degenerative changes noted of the spine. Endplate osteophytes throughout. Chronic appearing compression fracture at T12. No definite focal osseous abnormality. Sternum intact. IMPRESSION: Recurrent large right pleural effusion with complete right lung collapse/consolidation now obscuring the known right hilar/mediastinal lung mass. Anterior left upper lobe patchy airspace process with central air bronchograms concerning for left upper lobe pneumonia Minor lingula and left lower lobe atelectasis and trace left effusion. Aortic atherosclerosis and native coronary atherosclerosis Aortic aneurysm NOS (ICD10-I71.9). Electronically Signed   By: Jerilynn Mages.  Shick M.D.   On: 06/27/2017 17:13   Dg Chest Port 1 View  Result Date: 06/27/2017 CLINICAL DATA:  77 year old female with metastatic non-small cell lung cancer. Shortness of breath for 2 days and diarrhea.  Dehydrated, tachycardia. EXAM: PORTABLE CHEST 1 VIEW COMPARISON:  05/17/2017 radiographs and earlier. FINDINGS: Portable AP upright view at 1253 hours. New virtually complete whiteout of the right hemithorax.  Minimal aerated lung suspected about the right hilum. Abrupt cut off of the right mainstem bronchus. No definite mediastinal shift since March. Stable left lung. Stable left mediastinal contours. No acute osseous abnormality identified. IMPRESSION: 1. New nonspecific white out of the right hemithorax. This could reflect a combination of collapse, consolidation, and pleural effusion. Chest CT (IV contrast preferred) would best characterize further. 2. Stable left lung. Electronically Signed   By: Genevie Ann M.D.   On: 06/27/2017 13:27        Scheduled Meds: . albuterol  2.5 mg Nebulization TID  . enoxaparin (LOVENOX) injection  40 mg Subcutaneous Q24H  . metoprolol tartrate  50 mg Oral BID  . metroNIDAZOLE  500 mg Oral Q8H  . sertraline  50 mg Oral Daily   Continuous Infusions:    LOS: 3 days     Cordelia Poche, MD [facility] 06/29/2017, 9:24 AM Pager: [phone]  If 7PM-7AM, please contact night-coverage [email or web address] Password [facility] 06/29/2017, 9:24 AM

## 2017-06-29 NOTE — Progress Notes (Signed)
PT Cancellation Note  Patient Details Name: Yasmin Bronaugh MRN: [id number] DOB: 07/05/1940   Cancelled Treatment:     cancel this morning for procedure.  small bore (pigtail) chest tube to be placed today. Pt HAS been evaluated by PT on 06/27/17 with rec for [facility] at her [facility].  Pt moves well but limited due to dyspnea.      Rica Koyanagi  PTA [facility]  Acute  Rehab Pager      [phone]

## 2017-06-29 NOTE — Procedures (Signed)
  Procedure: R chest tunneled PleurX cather 1.5L thora EBL:   minimal Complications:  none immediate  See full dictation in [facility].  Dillard Cannon MD Main # [phone] Pager  [phone]

## 2017-06-30 ENCOUNTER — Inpatient Hospital Stay (HOSPITAL_COMMUNITY): Payer: Medicare Other

## 2017-06-30 DIAGNOSIS — I480 Paroxysmal atrial fibrillation: Secondary | ICD-10-CM

## 2017-06-30 DIAGNOSIS — I959 Hypotension, unspecified: Secondary | ICD-10-CM

## 2017-06-30 DIAGNOSIS — I4891 Unspecified atrial fibrillation: Secondary | ICD-10-CM

## 2017-06-30 DIAGNOSIS — A09 Infectious gastroenteritis and colitis, unspecified: Secondary | ICD-10-CM

## 2017-06-30 DIAGNOSIS — I952 Hypotension due to drugs: Secondary | ICD-10-CM

## 2017-06-30 DIAGNOSIS — J910 Malignant pleural effusion: Secondary | ICD-10-CM

## 2017-06-30 LAB — BASIC METABOLIC PANEL
Anion gap: 10 (ref 5–15)
BUN: 6 mg/dL (ref 6–20)
CALCIUM: 8.5 mg/dL — AB (ref 8.9–10.3)
CO2: 29 mmol/L (ref 22–32)
CREATININE: 0.43 mg/dL — AB (ref 0.44–1.00)
Chloride: 101 mmol/L (ref 101–111)
GFR calc Af Amer: >60 mL/min (ref >60)
GFR calc non Af Amer: >60 mL/min (ref >60)
GLUCOSE: 92 mg/dL (ref 65–99)
Potassium: 3.2 mmol/L — ABNORMAL LOW (ref 3.5–5.1)
Sodium: 140 mmol/L (ref 135–145)

## 2017-06-30 LAB — MRSA PCR SCREENING: MRSA by PCR: NEGATIVE

## 2017-06-30 LAB — MAGNESIUM: Magnesium: 1.7 mg/dL (ref 1.7–2.4)

## 2017-06-30 MED ORDER — AMIODARONE LOAD VIA INFUSION
150.0000 mg | Freq: Once | INTRAVENOUS | Status: DC
  Filled 2017-06-30: qty 83.34

## 2017-06-30 MED ORDER — DILTIAZEM LOAD VIA INFUSION: 10.0000 mg | Freq: Once | INTRAVENOUS | Status: DC

## 2017-06-30 MED ORDER — MORPHINE SULFATE (PF) 4 MG/ML IV SOLN
INTRAVENOUS | Status: AC
  Administered 2017-06-30: 2 mg via INTRAVENOUS
  Filled 2017-06-30: qty 1

## 2017-06-30 MED ORDER — AMIODARONE HCL IN DEXTROSE 360-4.14 MG/200ML-% IV SOLN
60.0000 mg/h | INTRAVENOUS | Status: DC
  Administered 2017-06-30: 60 mg/h via INTRAVENOUS
  Filled 2017-06-30 (×2): qty 200

## 2017-06-30 MED ORDER — DILTIAZEM HCL 100 MG IV SOLR: 5.0000 mg/h | INTRAVENOUS | Status: DC

## 2017-06-30 MED ORDER — SODIUM CHLORIDE 0.9 % IV SOLN: 0.0000 ug/min | INTRAVENOUS | Status: DC

## 2017-06-30 MED ORDER — PHENYLEPHRINE HCL-NACL 10-0.9 MG/250ML-% IV SOLN
0.0000 ug/min | INTRAVENOUS | Status: DC
  Administered 2017-06-30: 20 ug/min via INTRAVENOUS
  Filled 2017-06-30 (×2): qty 250

## 2017-06-30 MED ORDER — MORPHINE SULFATE (PF) 4 MG/ML IV SOLN
2.0000 mg | Freq: Once | INTRAVENOUS | Status: AC
  Administered 2017-06-30: 2 mg via INTRAVENOUS

## 2017-06-30 MED ORDER — HEPARIN BOLUS VIA INFUSION
3000.0000 [IU] | Freq: Once | INTRAVENOUS | Status: AC
  Administered 2017-06-30: 3000 [IU] via INTRAVENOUS
  Filled 2017-06-30: qty 3000

## 2017-06-30 MED ORDER — SODIUM CHLORIDE 0.9 % IV SOLN
0.0000 ug/min | INTRAVENOUS | Status: DC
  Filled 2017-06-30: qty 1

## 2017-06-30 MED ORDER — HEPARIN (PORCINE) IN NACL 100-0.45 UNIT/ML-% IJ SOLN
1150.0000 [IU]/h | INTRAMUSCULAR | Status: DC
  Administered 2017-06-30: 1150 [IU]/h via INTRAVENOUS
  Filled 2017-06-30: qty 250

## 2017-06-30 MED ORDER — POTASSIUM CHLORIDE CRYS ER 10 MEQ PO TBCR
40.0000 meq | EXTENDED_RELEASE_TABLET | Freq: Once | ORAL | Status: AC
  Administered 2017-06-30: 40 meq via ORAL
  Filled 2017-06-30: qty 4

## 2017-06-30 MED ORDER — SODIUM CHLORIDE 0.9 % IV BOLUS
1000.0000 mL | Freq: Once | INTRAVENOUS | Status: AC
  Administered 2017-06-30: 1000 mL via INTRAVENOUS

## 2017-06-30 MED ORDER — AMIODARONE HCL IN DEXTROSE 360-4.14 MG/200ML-% IV SOLN
30.0000 mg/h | INTRAVENOUS | Status: DC
  Administered 2017-06-30 – 2017-07-01 (×3): 30 mg/h via INTRAVENOUS
  Filled 2017-06-30 (×3): qty 200

## 2017-06-30 NOTE — Progress Notes (Signed)
Report called to [facility] RN. Pt.. transferred to ICU, daughter at bedside.

## 2017-06-30 NOTE — Progress Notes (Signed)
PROGRESS NOTE    Jaquel Coomer  IRW:[id number] DOB: April 19, 1940 DOA: 06/25/2017 PCP: [name], MD   Brief Narrative: Korina Tretter is a 77 y.o. female with a history of metastatic non-small cell carcinoma, hypertension. She presented with diarrhea and found to be dehydrated. Diarrhea has improved with initiation of metronidazole. Patient with decreased breath sounds, chest x-ray pending. PT eval pending.   Assessment & Plan:   Principal Problem:   Diarrhea Active Problems:   Essential hypertension   Physical deconditioning   Malignant neoplasm of posterior mediastinum (HCC)   Diarrhea Gastroenteritis. Concern for bacterial on admission. Stool studies never sent per patient. Husband positive for C. Difficile. Possible patient also arrived with C. Difficile, treated with metronidazole -Continue Metronidazole -PT eval: Home health PT -wean to 2 L  Essential hypertension Now with hypotension in setting of atrial fibrillation and recent pleurx catheter insertion with 1.5L out -Per cardiology for management of atrial fibrillation w/ RVR  Atrial fibrillation w/ RVR Hypotension Unsure which precipitated which. 1.5 L out from catheter placement yesterday for pleural effusion. Initial IV bolus improved BP very slightly with resultant return of hypotension. Patient is s/p metoprolol 50 mg this morning. May need amiodarone. Cannot give cardizem in setting of hypotension. No concern for sepsis. -Cardiology consult -May need CCM consult (pulmonology already consulted for effusion) for vasopressor support  Metastatic non-small cell carcinoma Patient currently under the care of Dr. Julien Nordmann and Dr. Lisbeth Renshaw. Patient has been treated with systemic chemotherapy in addition to palliative radiotherapy. Last cycle of chemotherapy (Keytruda, Taxol and Paraplatin) was 06/12/17  Large right sided effusion Patient with a history of lung mass. Has had pleural effusions in the past. Worsened dyspnea.  Concern for consolidation vs collapsed lung on CT. S/p pleurx catheter. Chest x-ray this morning without reexpansion of her lung. Significant effusion is persistent. -Pulmonology recommendations -Cytology pending  Acute on chronic respiratory failure with hypoxia Secondary to lung cancer and recurrent effusion. -Continue oxygen therapy  Hypokalemia -Potassium supplementation given -Check magnesium   DVT prophylaxis: Lovenox Code Status:   Code Status: Full Code Family Communication: None at bedside Disposition Plan: Discharge planning deferred until better control of blood pressure/arrhythmia    Consultants:   Pulmonology  Interventional radiology  Cardiology  Procedures:   Pleurx catheter (4/19)  Antimicrobials:  Metronidazole    Subjective: Lightheaded/dizzy  Objective: Vitals:   06/30/17 0952 06/30/17 1037 06/30/17 1124 06/30/17 1126  BP: 131/76 (!) 82/54 (!) 59/51 (!) 67/44  Pulse: (!) 131 (!) 120 (!) 102   Resp: (!) 24  (!) 28   Temp:      TempSrc:      SpO2: 95% 96% 96%   Weight:      Height:        Intake/Output Summary (Last 24 hours) at 06/30/2017 1142 Last data filed at 06/30/2017 0945 Gross per 24 hour  Intake 140 ml  Output -  Net 140 ml   Filed Weights   06/27/17 0624 06/28/17 0632 06/30/17 0500  Weight: 75.2 kg (165 lb 12.6 oz) 78.4 kg (172 lb 13.5 oz) 76 kg (167 lb 8.8 oz)    Examination:  General exam: Appears calm and comfortable Respiratory system: Clear to auscultation on left with diminished breath sounds on right. Respiratory effort normal. Cardiovascular system: S1 & S2 heard, rapid rate with irregular rhythm. No murmurs, rubs, gallops or clicks. 2+ radial pulses bilaterally Gastrointestinal system: Abdomen is nondistended, soft and nontender. No organomegaly or masses felt. Normal bowel sounds heard. Central  nervous system: Alert and oriented. No focal neurological deficits. Extremities: No edema. No calf tenderness Skin: No  cyanosis. No rashes. Warm. Psychiatry: Judgement and insight appear normal. Mood & affect appropriate.      Data Reviewed: I have personally reviewed following labs and imaging studies  CBC: Recent Labs  Lab 06/26/17 0052 06/26/17 1322 06/28/17 0605  WBC 1.5* 2.6* 5.7  NEUTROABS 0.8*  --   --   HGB 9.1* 9.5* 9.4*  HCT 29.9* 30.7* 30.2*  MCV 84.7 85.5 84.1  PLT 182 208 329   Basic Metabolic Panel: Recent Labs  Lab 06/26/17 0052 06/26/17 1322 06/28/17 0605 06/30/17 0609  NA 138 138 141 140  K 3.3* 3.6 3.1* 3.2*  CL 106 104 105 101  CO2 24 25 27 29   GLUCOSE 89 101* 102* 92  BUN 18 13 7 6   CREATININE 9.24 0.57 0.49 0.43*  CALCIUM 8.2* 8.1* 8.5* 8.5*   GFR: Estimated Creatinine Clearance: 60.6 mL/min (A) (by C-G formula based on SCr of 0.43 mg/dL (L)). Liver Function Tests: Recent Labs  Lab 06/26/17 0052 06/26/17 1322  AST 21 27  ALT 16 18  ALKPHOS 76 81  BILITOT 0.7 0.7  PROT 5.9* 6.3*  ALBUMIN 2.6* 2.7*   No results for input(s): LIPASE, AMYLASE in the last 168 hours. No results for input(s): AMMONIA in the last 168 hours. Coagulation Profile: Recent Labs  Lab 06/28/17 0605  INR 1.21   Cardiac Enzymes: No results for input(s): CKTOTAL, CKMB, CKMBINDEX, TROPONINI in the last 168 hours. BNP (last 3 results) No results for input(s): PROBNP in the last 8760 hours. HbA1C: No results for input(s): HGBA1C in the last 72 hours. CBG: No results for input(s): GLUCAP in the last 168 hours. Lipid Profile: No results for input(s): CHOL, HDL, LDLCALC, TRIG, CHOLHDL, LDLDIRECT in the last 72 hours. Thyroid Function Tests: No results for input(s): TSH, T4TOTAL, FREET4, T3FREE, THYROIDAB in the last 72 hours. Anemia Panel: No results for input(s): VITAMINB12, FOLATE, FERRITIN, TIBC, IRON, RETICCTPCT in the last 72 hours. Sepsis Labs: No results for input(s): PROCALCITON, LATICACIDVEN in the last 168 hours.  No results found for this or any previous visit (from  the past 240 hour(s)).       Radiology Studies: Ir Lenise Arena W Catheter Placement  Result Date: 06/29/2017 CLINICAL DATA:  Large malignant right pleural effusion EXAM: INSERTION OF TUNNELED PLEURAL DRAINAGE CATHETER THORACENTESIS RIGHT ANESTHESIA/SEDATION: Intravenous Fentanyl and Versed were administered as conscious sedation during continuous monitoring of the patient's level of consciousness and physiological / cardiorespiratory status by the radiology RN, with a total moderate sedation time of 10 minutes. Marland Kitchen MEDICATIONS: Cefazolin 2 g IV. Antibiotic was administered in an appropriate time interval for the procedure. FLUOROSCOPY TIME:  6 seconds; 1 mGy PROCEDURE: The procedure, risks, benefits, and alternatives were explained to the patient. Questions regarding the procedure were encouraged and answered. The patient understands and consents to the procedure. Survey ultrasound of the right chest was performed an appropriate skin entry sites determined and marked. The right chest wall was prepped with chlorhexidine in a sterile fashion, and a sterile drape was applied covering the operative field. A sterile gown and sterile gloves were used for the procedure. Local anesthesia was provided with 1% Lidocaine. Ultrasound image documentation was performed. Fluoroscopy during the procedure and fluoroscopic spot radiograph confirms appropriate catheter position. After creating a small skin incision, a 19 gauge sheath needle was advanced into the pleural cavity under ultrasound guidance. A guide wire  was then advanced under fluoroscopy into the pleural space. Pleural access was dilated serially and a 16-French peel-away sheath placed. A tunneled cuffed PleurX catheter was placed. This was tunneled from an incision 5 cm inferomedial to the pleural access to the access site. The catheter was advanced through the peel-away sheath. The sheath was then removed. Final catheter positioning was confirmed with a  fluoroscopic spot image. The access incision was closed with Dermabond applied to the incision. A Prolene retention suture was applied at the catheter exit site. Large volume 1.5 L thoracentesis was performed through the new catheter, a sample sent for the requested laboratory studies. The patient tolerated the procedure well. COMPLICATIONS: None. FINDINGS: Large right pleural effusion confirmed on ultrasound. The catheter was placed via the right lateral chest wall. Catheter course is towards the apex. Approximately 1.5 liters of pleural fluid was able to be removed after catheter placement. The patient will be brought back in 10 to 14days to remove the temporary exit site retention suture after appropriate in-growth around the subcutaneous cuff of the catheter. IMPRESSION: Placement of permanent, tunneled right pleural drainage catheter via lateral approach. 1.5 liters of pleural fluid was removed today after catheter placement. Electronically Signed   By: Lucrezia Europe M.D.   On: 06/29/2017 17:04   Ir US Guide Bx Asp/drain  Result Date: 06/29/2017 CLINICAL DATA:  Large malignant right pleural effusion EXAM: INSERTION OF TUNNELED PLEURAL DRAINAGE CATHETER THORACENTESIS RIGHT ANESTHESIA/SEDATION: Intravenous Fentanyl and Versed were administered as conscious sedation during continuous monitoring of the patient's level of consciousness and physiological / cardiorespiratory status by the radiology RN, with a total moderate sedation time of 10 minutes. Marland Kitchen MEDICATIONS: Cefazolin 2 g IV. Antibiotic was administered in an appropriate time interval for the procedure. FLUOROSCOPY TIME:  6 seconds; 1 mGy PROCEDURE: The procedure, risks, benefits, and alternatives were explained to the patient. Questions regarding the procedure were encouraged and answered. The patient understands and consents to the procedure. Survey ultrasound of the right chest was performed an appropriate skin entry sites determined and marked. The right  chest wall was prepped with chlorhexidine in a sterile fashion, and a sterile drape was applied covering the operative field. A sterile gown and sterile gloves were used for the procedure. Local anesthesia was provided with 1% Lidocaine. Ultrasound image documentation was performed. Fluoroscopy during the procedure and fluoroscopic spot radiograph confirms appropriate catheter position. After creating a small skin incision, a 19 gauge sheath needle was advanced into the pleural cavity under ultrasound guidance. A guide wire was then advanced under fluoroscopy into the pleural space. Pleural access was dilated serially and a 16-French peel-away sheath placed. A tunneled cuffed PleurX catheter was placed. This was tunneled from an incision 5 cm inferomedial to the pleural access to the access site. The catheter was advanced through the peel-away sheath. The sheath was then removed. Final catheter positioning was confirmed with a fluoroscopic spot image. The access incision was closed with Dermabond applied to the incision. A Prolene retention suture was applied at the catheter exit site. Large volume 1.5 L thoracentesis was performed through the new catheter, a sample sent for the requested laboratory studies. The patient tolerated the procedure well. COMPLICATIONS: None. FINDINGS: Large right pleural effusion confirmed on ultrasound. The catheter was placed via the right lateral chest wall. Catheter course is towards the apex. Approximately 1.5 liters of pleural fluid was able to be removed after catheter placement. The patient will be brought back in 10 to 14days to remove  the temporary exit site retention suture after appropriate in-growth around the subcutaneous cuff of the catheter. IMPRESSION: Placement of permanent, tunneled right pleural drainage catheter via lateral approach. 1.5 liters of pleural fluid was removed today after catheter placement. Electronically Signed   By: Lucrezia Europe M.D.   On: 06/29/2017  17:04        Scheduled Meds: . albuterol  2.5 mg Nebulization TID  . amiodarone  150 mg Intravenous Once  . metoprolol tartrate  50 mg Oral BID  . metroNIDAZOLE  500 mg Oral Q8H  . sertraline  50 mg Oral Daily   Continuous Infusions: . amiodarone     Followed by  . amiodarone    . heparin 1,150 Units/hr (06/30/17 1113)  . sodium chloride 1,000 mL (06/30/17 1142)     LOS: 4 days     Cordelia Poche, MD [facility] 06/30/2017, 11:42 AM Pager: [phone]  If 7PM-7AM, please contact night-coverage [email or web address] Password [facility] 06/30/2017, 11:42 AM

## 2017-06-30 NOTE — Consult Note (Signed)
PULMONARY / CRITICAL CARE MEDICINE   Name: Cassidy Sanders MRN: [id number] DOB: [birth year]    ADMISSION DATE:  06/25/2017 CONSULTATION DATE:  06/29/2017  REFERRING MD:  Lonny Prude - [facility]  CHIEF COMPLAINT: Right pleural effusion.  HISTORY OF PRESENT ILLNESS:   77 year old woman with a history of non-small cell lung cancer.  She was admitted with protracted diarrhea and was found to have a C. difficile colitis.  Diarrhea has now settled on treatment.  She also complained of increasing shortness of breath and is known to have a right pleural effusion previously seen by Dr. Lake Bells and which had previously been tapped 3/1 >> atypical cells.  On presentation this time however the effusion now filled her right hemithorax completely.  Imaging  CT scan performed on 4-17 showsRecurrent large right pleural effusion with complete right lung collapse/consolidation now obscuring the known right hilar/mediastinal lung mass.   Significant  events  4/19 pleurx catheter placement by IR >> 1.5 L drained    SUBJECTIVE:  Developed AF-rvr this am with hypotension requiring transfer to ICU  Given 2L NS   VITAL SIGNS: BP (!) 92/58 (BP Location: Left Arm)   Pulse (!) 124   Temp 98.1 F (36.7 C) (Oral)   Resp (!) 37   Ht 5\' 9"  (1.753 m)   Wt 173 lb 11.6 oz (78.8 kg)   SpO2 93%   BMI 25.65 kg/m       INTAKE / OUTPUT: I/O last 3 completed shifts: In: 240 [P.O.:240] Out: -   PHYSICAL EXAMINATION: General: Thin frail woman in mild respiratory distress, but able to speak in full sentences. Neuro: Alert and oriented.  Non focal HEENT: alopecia.  Scleral pallor.+ no icterus  Cardiovascular: First and second heart sounds are unremarkable.  No edema.  JVP not elevated. Lungs: decreased on right, dull to percuss Abdomen: Abdomen is soft and nontender. Musculoskeletal: no  Joint swelling Skin: Skin is intact  LABS:  BMET Recent Labs  Lab 06/26/17 1322 06/28/17 0605 06/30/17 0609   NA 138 141 140  K 3.6 3.1* 3.2*  CL 104 105 101  CO2 25 27 29   BUN 13 7 6   CREATININE 0.57 0.49 0.43*  GLUCOSE 101* 102* 92    Electrolytes Recent Labs  Lab 06/26/17 1322 06/28/17 0605 06/30/17 0609  CALCIUM 8.1* 8.5* 8.5*    CBC Recent Labs  Lab 06/26/17 0052 06/26/17 1322 06/28/17 0605  WBC 1.5* 2.6* 5.7  HGB 9.1* 9.5* 9.4*  HCT 29.9* 30.7* 30.2*  PLT 182 208 204    Coag's Recent Labs  Lab 06/28/17 0605  APTT 34  INR 1.21    Sepsis Markers No results for input(s): LATICACIDVEN, PROCALCITON, O2SATVEN in the last 168 hours.  ABG No results for input(s): PHART, PCO2ART, PO2ART in the last 168 hours.  Liver Enzymes Recent Labs  Lab 06/26/17 0052 06/26/17 1322  AST 21 27  ALT 16 18  ALKPHOS 76 81  BILITOT 0.7 0.7  ALBUMIN 2.6* 2.7*    Cardiac Enzymes No results for input(s): TROPONINI, PROBNP in the last 168 hours.  Glucose No results for input(s): GLUCAP in the last 168 hours.    DISCUSSION: This 77 year old lady has recurrent large pleural effusion undoubtedly related to her non-small cell lung cancer. Atypical cells on previous  fluid cytology . S/p pleurX   ASSESSMENT / PLAN:  Right pleural effusion , likely malignant - drain another 1.5 l today Send for cytology  AF-RVR - per cards , She converted to  NSR , so may not need amio gtt Hypotension - related to above, resolved with fluids  C diff colitis - oral flagyl   PCCM to see again Monday  Kara Mead MD. [facility]. [facility] Pager [phone] If no response call [phone]     06/30/2017, 2:23 PM

## 2017-06-30 NOTE — Progress Notes (Signed)
Pt. complained of dizziness, no chest pain. Pt. states shortness of breath at baseline Resp.=36. Bp low, heart rate fluctuating. MD notified, in for observation. IV fluid bolus, EKG completed.

## 2017-06-30 NOTE — Progress Notes (Signed)
Pt. continues with low blood pressure and heart rate fluctuations. Denies complaints of chest pain. Respirations remain 28-30, O2 sat 93-96%  Pt. also states dizziness comes and goes. Awaiting ICU bed for transfer.

## 2017-06-30 NOTE — Progress Notes (Signed)
[place] for IV heparin Indication: new AFib  Allergies  Allergen Reactions  . Latex Dermatitis  . Gabapentin Other (See Comments)    Excessive sleep   . Tape Rash    Patient Measurements: Height: 5\' 9"  (175.3 cm) Weight: 167 lb 8.8 oz (76 kg) IBW/kg (Calculated) : 66.2 Heparin Dosing Weight: TBW  Vital Signs: Temp: 97.8 F (36.6 C) (04/20 0745) Temp Source: Oral (04/20 0745) BP: 82/54 (04/20 1037) Pulse Rate: 120 (04/20 1037)  Labs: Recent Labs    06/28/17 0605 06/30/17 0609  HGB 9.4*  --   HCT 30.2*  --   PLT 204  --   APTT 34  --   LABPROT 15.2  --   INR 1.21  --   CREATININE 0.49 0.43*    Estimated Creatinine Clearance: 60.6 mL/min (A) (by C-G formula based on SCr of 0.43 mg/dL (L)).   Medical History: Past Medical History:  Diagnosis Date  . Brain tumor (benign) ([place])   . Depression   . Hypertension     Medications:  Medications Prior to Admission  Medication Sig Dispense Refill Last Dose  . albuterol (PROVENTIL HFA;VENTOLIN HFA) 108 (90 Base) MCG/ACT inhaler Inhale 2 puffs into the lungs every 4 (four) hours as needed for wheezing or shortness of breath. 1 Inhaler 12 Past Month at Unknown time  . Biotin 2.5 MG TABS Take 2,500 mcg by mouth daily.   06/25/2017 at Unknown time  . hydrochlorothiazide (MICROZIDE) 12.5 MG capsule Take 12.5 mg by mouth daily.  6 06/25/2017 at Unknown time  . loperamide (IMODIUM) 2 MG capsule Take 2 mg by mouth as needed for diarrhea or loose stools.   Past Week at Unknown time  . metoprolol tartrate (LOPRESSOR) 50 MG tablet Take 0.5 tablets (25 mg total) by mouth 2 (two) times daily. (Patient taking differently: Take 50 mg by mouth daily. ) 30 tablet 11 06/25/2017 at 0800  . sertraline (ZOLOFT) 50 MG tablet Take 50 mg by mouth daily.   06/25/2017 at Unknown time  . chlorpheniramine-HYDROcodone (TUSSIONEX) 10-8 MG/5ML SUER Take 5 mLs by mouth every 12 (twelve) hours as needed for cough.  (Patient not taking: Reported on 06/26/2017) 140 mL 0 Not Taking at Unknown time  . Lactulose 20 GM/30ML SOLN Take 30 mLs (20 g total) by mouth 4 (four) times daily as needed. (Patient not taking: Reported on 06/12/2017) 236 mL 0 Not Taking  . prochlorperazine (COMPAZINE) 10 MG tablet Take 1 tablet (10 mg total) by mouth every 6 (six) hours as needed for nausea or vomiting. (Patient not taking: Reported on 06/26/2017) 30 tablet 0 Not Taking at Unknown time   Scheduled:  . albuterol  2.5 mg Nebulization TID  . amiodarone  150 mg Intravenous Once  . diltiazem  10 mg Intravenous Once  . heparin  3,000 Units Intravenous Once  . metoprolol tartrate  50 mg Oral BID  . metroNIDAZOLE  500 mg Oral Q8H  . sertraline  50 mg Oral Daily   Infusions:  . amiodarone     Followed by  . amiodarone    . diltiazem (CARDIZEM) infusion    . 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 Went into rapid Afib this AM; pharmacy consulted to dose heparin.   Baseline INR, aPTT: wnl  Prior anticoagulation: none  Significant events:  Today, 06/30/2017:  CBC: Hgb low but stable; Plt stable WNL  No bleeding or infusion issues per nursing  CrCl: 61 ml/min  Goal of Therapy: Heparin level 0.3-0.7 units/ml Monitor platelets by anticoagulation protocol: Yes  Plan:  Heparin 3000 units IV bolus x 1  Heparin 1150 units/hr IV infusion  Check heparin level 8 hrs after start  Daily CBC, daily heparin level once stable  Monitor for signs of bleeding or thrombosis   Reuel Boom, PharmD, BCPS [phone] 06/30/2017, 10:56 AM

## 2017-06-30 NOTE — Consult Note (Signed)
CARDIOLOGY CONSULT NOTE       Patient ID: Adanna Zuckerman MRN: [id number] DOB/AGE: September 23, 1940 77 y.o.  Admit date: 06/25/2017 Referring Physician: Lonny Prude Primary Physician: [name], MD Primary Cardiologist: New Reason for Consultation: Rapid Afib  Principal Problem:   Diarrhea Active Problems:   Essential hypertension   Physical deconditioning   Malignant neoplasm of posterior mediastinum (HCC)   HPI:  77 y.o. 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 Recurrence with dyspnea this admission and is s/p pleur X catheter placement with poor re expansion of lung Previous tachycardia sinus due to cancer but no PAF or other cardiac issues. This am went into rapid afib She does Not note it no chest pain or palpitations chronic dyspnea from lung cancer and effusion No contraindications to anticoagulation  This patients CHA2DS2-VASc Score and unadjusted Ischemic Stroke Rate (% per year) is equal to 4.8 % stroke rate/year from a score of 4  Above score calculated as 1 point each if present [CHF, HTN, DM, Vascular=MI/PAD/Aortic Plaque, Age if 65-74, or Female] Above score calculated as 2 points each if present [Age > 75, or Stroke/TIA/TE]   ROS All other systems reviewed and negative except as noted above  Past Medical History:  Diagnosis Date  . Brain tumor (benign) ([place])   . Depression   . Hypertension     Family History  Family history unknown: Yes    Social History   Socioeconomic History  . Marital status: Married    Spouse name: Not on file  . Number of children: Not on file  . Years of education: Not on file  . Highest education level: Not on file  Occupational History  . Not on file  Social Needs  . Financial resource strain: Not on file  . Food insecurity:    Worry: Not on file    Inability: Not on file  . Transportation needs:    Medical: Not on file    Non-medical: Not on file    Tobacco Use  . Smoking status: Former Smoker    Packs/day: 1.00    Years: 59.00    Pack years: 59.00    Types: Cigarettes    Last attempt to quit: 06/11/2012    Years since quitting: 5.0  . Smokeless tobacco: Never Used  Substance and Sexual Activity  . Alcohol use: Yes    Comment: Weekly   . Drug use: No  . Sexual activity: Not on file  Lifestyle  . Physical activity:    Days per week: Not on file    Minutes per session: Not on file  . Stress: Not on file  Relationships  . Social connections:    Talks on phone: Not on file    Gets together: Not on file    Attends religious service: Not on file    Active member of club or organization: Not on file    Attends meetings of clubs or organizations: Not on file    Relationship status: Not on file  . Intimate partner violence:    Fear of current or ex partner: Not on file    Emotionally abused: Not on file    Physically abused: Not on file    Forced sexual activity: Not on file  Other Topics Concern  . Not on file  Social History Narrative  . Not on file    Past Surgical History:  Procedure Laterality Date  . ABDOMINAL  HYSTERECTOMY    . BLADDER SURGERY    . BRAIN SURGERY    . ENDOBRONCHIAL ULTRASOUND Bilateral 02/06/2017   Procedure: ENDOBRONCHIAL ULTRASOUND;  Surgeon: Juanito Doom, MD;  Location: [facility];  Service: Cardiopulmonary;  Laterality: Bilateral;  . IR GUIDED DRAIN W CATHETER PLACEMENT  06/29/2017  . IR US GUIDE BX ASP/DRAIN  06/29/2017  . VIDEO BRONCHOSCOPY WITH ENDOBRONCHIAL ULTRASOUND N/A 03/23/2017   Procedure: VIDEO BRONCHOSCOPY WITH ENDOBRONCHIAL ULTRASOUND;  Surgeon: Juanito Doom, MD;  Location: [facility] OR;  Service: Thoracic;  Laterality: N/A;     . albuterol  2.5 mg Nebulization TID  . enoxaparin (LOVENOX) injection  40 mg Subcutaneous Q24H  . metoprolol tartrate  50 mg Oral BID  . metroNIDAZOLE  500 mg Oral Q8H  . sertraline  50 mg Oral Daily   . sodium chloride 1,000 mL (06/30/17 0940)     Physical Exam: Blood pressure 131/76, pulse (!) 131, temperature 97.8 F (36.6 C), temperature source Oral, resp. rate (!) 24, height 5\' 9"  (1.753 m), weight 167 lb 8.8 oz (76 kg), SpO2 95 %.   Affect appropriate Chronically ill elderly white female HEENT: Alopecia  Neck supple with no adenopathy JVP normal no bruits no thyromegaly Lungs absent BS on right Pleur X catheter  Heart:  S1/S2 no murmur, no rub, gallop or click PMI normal Abdomen: benighn, BS positve, no tenderness, no AAA no bruit.  No HSM or HJR Distal pulses intact with no bruits No edema Neuro non-focal Skin warm and dry No muscular weakness   Labs:   Lab Results  Component Value Date   WBC 5.7 06/28/2017   HGB 9.4 (L) 06/28/2017   HCT 30.2 (L) 06/28/2017   MCV 84.1 06/28/2017   PLT 204 06/28/2017    Recent Labs  Lab 06/26/17 1322  06/30/17 0609  NA 138   < > 140  K 3.6   < > 3.2*  CL 104   < > 101  CO2 25   < > 29  BUN 13   < > 6  CREATININE 0.57   < > 0.43*  CALCIUM 8.1*   < > 8.5*  PROT 6.3*  --   --   BILITOT 0.7  --   --   ALKPHOS 81  --   --   ALT 18  --   --   AST 27  --   --   GLUCOSE 101*   < > 92   < > = values in this interval not displayed.   Lab Results  Component Value Date   TROPONINI <0.03 02/06/2017   No results found for: CHOL No results found for: HDL No results found for: [facility] Lab Results  Component Value Date   TRIG 111 02/06/2017   No results found for: CHOLHDL No results found for: LDLDIRECT    Radiology: Ct Chest W Contrast  Result Date: 06/27/2017 CLINICAL DATA:  Known right hilar non-small-cell lung cancer, hypertension, dehydration, decreased breath sounds, pleural effusion EXAM: CT CHEST WITH CONTRAST TECHNIQUE: Multidetector CT imaging of the chest was performed during intravenous contrast administration. CONTRAST:  6mL OMNIPAQUE IOHEXOL 300 MG/ML  SOLN COMPARISON:  06/27/2017 chest x-ray, 03/12/2017 PET-CT, 01/31/2017 chest CT FINDINGS:  Cardiovascular: Atherosclerosis of the major branch vessels and aorta without aneurysm, dissection, mediastinal hemorrhage/hematoma. Central pulmonary arteries appear patent. Native coronary atherosclerosis noted. Normal heart size. No pericardial effusion. Mediastinum/Nodes: Known right hilar/mediastinal lung mass is obscured by the complete right lung collapse/consolidation related to the  large right pleural effusion. Small inferior right thyroid nodules noted. Trachea and left central airways are patent. Right mainstem bronchus and bronchus intermedius appear occluded centrally. No significant or bulky adenopathy demonstrated. Lungs/Pleura: Ill-defined right hilar/mediastinal mass again obscured by right lung complete collapse/consolidation and the large effusion on the right. Trace left pleural effusion dependently. Anterior left upper lobe patchy airspace process with central air bronchograms may represent infectious/inflammatory process or pneumonia the left upper lobe. Minor lingula and left basilar atelectasis. Upper Abdomen: Stable bilateral adrenal nodules, previously demonstrated to be adenomas by PET-CT. Abdominal atherosclerosis noted. No acute upper abdominal finding. Musculoskeletal: Degenerative changes noted of the spine. Endplate osteophytes throughout. Chronic appearing compression fracture at T12. No definite focal osseous abnormality. Sternum intact. IMPRESSION: Recurrent large right pleural effusion with complete right lung collapse/consolidation now obscuring the known right hilar/mediastinal lung mass. Anterior left upper lobe patchy airspace process with central air bronchograms concerning for left upper lobe pneumonia Minor lingula and left lower lobe atelectasis and trace left effusion. Aortic atherosclerosis and native coronary atherosclerosis Aortic aneurysm NOS (ICD10-I71.9). Electronically Signed   By: Jerilynn Mages.  Shick M.D.   On: 06/27/2017 17:13   Ir Guided Niel Hummer W Catheter  Placement  Result Date: 06/29/2017 CLINICAL DATA:  Large malignant right pleural effusion EXAM: INSERTION OF TUNNELED PLEURAL DRAINAGE CATHETER THORACENTESIS RIGHT ANESTHESIA/SEDATION: Intravenous Fentanyl and Versed were administered as conscious sedation during continuous monitoring of the patient's level of consciousness and physiological / cardiorespiratory status by the radiology RN, with a total moderate sedation time of 10 minutes. Marland Kitchen MEDICATIONS: Cefazolin 2 g IV. Antibiotic was administered in an appropriate time interval for the procedure. FLUOROSCOPY TIME:  6 seconds; 1 mGy PROCEDURE: The procedure, risks, benefits, and alternatives were explained to the patient. Questions regarding the procedure were encouraged and answered. The patient understands and consents to the procedure. Survey ultrasound of the right chest was performed an appropriate skin entry sites determined and marked. The right chest wall was prepped with chlorhexidine in a sterile fashion, and a sterile drape was applied covering the operative field. A sterile gown and sterile gloves were used for the procedure. Local anesthesia was provided with 1% Lidocaine. Ultrasound image documentation was performed. Fluoroscopy during the procedure and fluoroscopic spot radiograph confirms appropriate catheter position. After creating a small skin incision, a 19 gauge sheath needle was advanced into the pleural cavity under ultrasound guidance. A guide wire was then advanced under fluoroscopy into the pleural space. Pleural access was dilated serially and a 16-French peel-away sheath placed. A tunneled cuffed PleurX catheter was placed. This was tunneled from an incision 5 cm inferomedial to the pleural access to the access site. The catheter was advanced through the peel-away sheath. The sheath was then removed. Final catheter positioning was confirmed with a fluoroscopic spot image. The access incision was closed with Dermabond applied to the  incision. A Prolene retention suture was applied at the catheter exit site. Large volume 1.5 L thoracentesis was performed through the new catheter, a sample sent for the requested laboratory studies. The patient tolerated the procedure well. COMPLICATIONS: None. FINDINGS: Large right pleural effusion confirmed on ultrasound. The catheter was placed via the right lateral chest wall. Catheter course is towards the apex. Approximately 1.5 liters of pleural fluid was able to be removed after catheter placement. The patient will be brought back in 10 to 14days to remove the temporary exit site retention suture after appropriate in-growth around the subcutaneous cuff of the catheter. IMPRESSION: Placement of permanent,  tunneled right pleural drainage catheter via lateral approach. 1.5 liters of pleural fluid was removed today after catheter placement. Electronically Signed   By: Lucrezia Europe M.D.   On: 06/29/2017 17:04   Ir US Guide Bx Asp/drain  Result Date: 06/29/2017 CLINICAL DATA:  Large malignant right pleural effusion EXAM: INSERTION OF TUNNELED PLEURAL DRAINAGE CATHETER THORACENTESIS RIGHT ANESTHESIA/SEDATION: Intravenous Fentanyl and Versed were administered as conscious sedation during continuous monitoring of the patient's level of consciousness and physiological / cardiorespiratory status by the radiology RN, with a total moderate sedation time of 10 minutes. Marland Kitchen MEDICATIONS: Cefazolin 2 g IV. Antibiotic was administered in an appropriate time interval for the procedure. FLUOROSCOPY TIME:  6 seconds; 1 mGy PROCEDURE: The procedure, risks, benefits, and alternatives were explained to the patient. Questions regarding the procedure were encouraged and answered. The patient understands and consents to the procedure. Survey ultrasound of the right chest was performed an appropriate skin entry sites determined and marked. The right chest wall was prepped with chlorhexidine in a sterile fashion, and a sterile drape  was applied covering the operative field. A sterile gown and sterile gloves were used for the procedure. Local anesthesia was provided with 1% Lidocaine. Ultrasound image documentation was performed. Fluoroscopy during the procedure and fluoroscopic spot radiograph confirms appropriate catheter position. After creating a small skin incision, a 19 gauge sheath needle was advanced into the pleural cavity under ultrasound guidance. A guide wire was then advanced under fluoroscopy into the pleural space. Pleural access was dilated serially and a 16-French peel-away sheath placed. A tunneled cuffed PleurX catheter was placed. This was tunneled from an incision 5 cm inferomedial to the pleural access to the access site. The catheter was advanced through the peel-away sheath. The sheath was then removed. Final catheter positioning was confirmed with a fluoroscopic spot image. The access incision was closed with Dermabond applied to the incision. A Prolene retention suture was applied at the catheter exit site. Large volume 1.5 L thoracentesis was performed through the new catheter, a sample sent for the requested laboratory studies. The patient tolerated the procedure well. COMPLICATIONS: None. FINDINGS: Large right pleural effusion confirmed on ultrasound. The catheter was placed via the right lateral chest wall. Catheter course is towards the apex. Approximately 1.5 liters of pleural fluid was able to be removed after catheter placement. The patient will be brought back in 10 to 14days to remove the temporary exit site retention suture after appropriate in-growth around the subcutaneous cuff of the catheter. IMPRESSION: Placement of permanent, tunneled right pleural drainage catheter via lateral approach. 1.5 liters of pleural fluid was removed today after catheter placement. Electronically Signed   By: Lucrezia Europe M.D.   On: 06/29/2017 17:04   Dg Chest Port 1 View  Result Date: 06/27/2017 CLINICAL DATA:  77 year old  female with metastatic non-small cell lung cancer. Shortness of breath for 2 days and diarrhea. Dehydrated, tachycardia. EXAM: PORTABLE CHEST 1 VIEW COMPARISON:  05/17/2017 radiographs and earlier. FINDINGS: Portable AP upright view at 1253 hours. New virtually complete whiteout of the right hemithorax. Minimal aerated lung suspected about the right hilum. Abrupt cut off of the right mainstem bronchus. No definite mediastinal shift since March. Stable left lung. Stable left mediastinal contours. No acute osseous abnormality identified. IMPRESSION: 1. New nonspecific white out of the right hemithorax. This could reflect a combination of collapse, consolidation, and pleural effusion. Chest CT (IV contrast preferred) would best characterize further. 2. Stable left lung. Electronically Signed   By: Lemmie Evens  [place] Crane M.D.   On: 06/27/2017 13:27    EKG: afib rate 130 nonspecific ST chagnes    ASSESSMENT AND PLAN:   PAF:  Will start iv cardizem for rate control and amiodarone since onset less than 48 hours to convert. Discussed anticoagulation and risk of stroke with patient use heparin for now. Echo to assess EF and valves no effusion by CT  Pleural Effusion: glad she decided on pleur X catheter needs to be drained again today should not be issue with heparin  Lung Cancer:  Prognosis seems poor per oncology   Cdiff:  On metronidazole improved   Signed: Jenkins Rouge 06/30/2017, 10:24 AM

## 2017-07-01 ENCOUNTER — Inpatient Hospital Stay (HOSPITAL_COMMUNITY): Payer: Medicare Other

## 2017-07-01 DIAGNOSIS — I4891 Unspecified atrial fibrillation: Secondary | ICD-10-CM

## 2017-07-01 LAB — HEPARIN LEVEL (UNFRACTIONATED)
HEPARIN UNFRACTIONATED: 0.28 [IU]/mL — AB (ref 0.30–0.70)
Heparin Unfractionated: <0.1 IU/mL — ABNORMAL LOW (ref 0.30–0.70)
Heparin Unfractionated: <0.1 IU/mL — ABNORMAL LOW (ref 0.30–0.70)

## 2017-07-01 LAB — BASIC METABOLIC PANEL
Anion gap: 7 (ref 5–15)
BUN: 8 mg/dL (ref 6–20)
CHLORIDE: 105 mmol/L (ref 101–111)
CO2: 26 mmol/L (ref 22–32)
Calcium: 8 mg/dL — ABNORMAL LOW (ref 8.9–10.3)
Creatinine, Ser: 0.47 mg/dL (ref 0.44–1.00)
GFR calc non Af Amer: >60 mL/min (ref >60)
Glucose, Bld: 102 mg/dL — ABNORMAL HIGH (ref 65–99)
POTASSIUM: 3.5 mmol/L (ref 3.5–5.1)
SODIUM: 138 mmol/L (ref 135–145)

## 2017-07-01 LAB — CBC
HCT: 27.7 % — ABNORMAL LOW (ref 36.0–46.0)
Hemoglobin: 8.6 g/dL — ABNORMAL LOW (ref 12.0–15.0)
MCH: 26.6 pg (ref 26.0–34.0)
MCHC: 31 g/dL (ref 30.0–36.0)
MCV: 85.8 fL (ref 78.0–100.0)
Platelets: 225 10*3/uL (ref 150–400)
RBC: 3.23 MIL/uL — ABNORMAL LOW (ref 3.87–5.11)
RDW: 18.5 % — AB (ref 11.5–15.5)
WBC: 7.5 10*3/uL (ref 4.0–10.5)

## 2017-07-01 LAB — ECHOCARDIOGRAM COMPLETE
Height: 69 in
Weight: 2825.42 oz

## 2017-07-01 LAB — MAGNESIUM: MAGNESIUM: 1.6 mg/dL — AB (ref 1.7–2.4)

## 2017-07-01 MED ORDER — SODIUM CHLORIDE 0.9 % IV BOLUS
500.0000 mL | Freq: Once | INTRAVENOUS | Status: AC
  Administered 2017-07-01: 500 mL via INTRAVENOUS

## 2017-07-01 MED ORDER — HEPARIN (PORCINE) IN NACL 100-0.45 UNIT/ML-% IJ SOLN
1400.0000 [IU]/h | INTRAMUSCULAR | Status: DC
  Administered 2017-07-01 (×2): 1400 [IU]/h via INTRAVENOUS
  Filled 2017-07-01: qty 250

## 2017-07-01 MED ORDER — ORAL CARE MOUTH RINSE
15.0000 mL | Freq: Two times a day (BID) | OROMUCOSAL | Status: DC
  Administered 2017-07-01 – 2017-07-06 (×7): 15 mL via OROMUCOSAL

## 2017-07-01 MED ORDER — OXYCODONE-ACETAMINOPHEN 5-325 MG PO TABS
1.0000 | ORAL_TABLET | ORAL | Status: DC | PRN
  Administered 2017-07-01: 2 via ORAL
  Filled 2017-07-01: qty 2

## 2017-07-01 MED ORDER — HEPARIN BOLUS VIA INFUSION
3000.0000 [IU] | Freq: Once | INTRAVENOUS | Status: AC
  Administered 2017-07-01: 3000 [IU] via INTRAVENOUS
  Filled 2017-07-01: qty 3000

## 2017-07-01 MED ORDER — HEPARIN BOLUS VIA INFUSION
2000.0000 [IU] | Freq: Once | INTRAVENOUS | Status: AC
  Administered 2017-07-01: 2000 [IU] via INTRAVENOUS
  Filled 2017-07-01: qty 2000

## 2017-07-01 MED ORDER — HEPARIN (PORCINE) IN NACL 100-0.45 UNIT/ML-% IJ SOLN
1600.0000 [IU]/h | INTRAMUSCULAR | Status: DC
  Administered 2017-07-01: 1600 [IU]/h via INTRAVENOUS
  Filled 2017-07-01: qty 250

## 2017-07-01 MED ORDER — HEPARIN (PORCINE) IN NACL 100-0.45 UNIT/ML-% IJ SOLN
1800.0000 [IU]/h | INTRAMUSCULAR | Status: DC
  Administered 2017-07-01 – 2017-07-02 (×2): 1800 [IU]/h via INTRAVENOUS
  Filled 2017-07-01: qty 250

## 2017-07-01 MED ORDER — MAGNESIUM SULFATE 2 GM/50ML IV SOLN
2.0000 g | Freq: Once | INTRAVENOUS | Status: AC
  Administered 2017-07-01: 2 g via INTRAVENOUS
  Filled 2017-07-01: qty 50

## 2017-07-01 NOTE — Progress Notes (Signed)
PROGRESS NOTE [facility]   Cathryne Mancebo   QIO:[id number] DOB: 06/30/1940  DOA: 06/25/2017 PCP: [name], MD   Brief Narrative:  Kamilia Carollo a 77 y.o. female with a history of metastatic non-small cell carcinoma, hypertension. She presented with diarrhea and found to be dehydrated. Diarrhea has improved with initiation of metronidazole. S/p Pleurex, hospital course complicated with SOB, hypotension and Afib. Got neo for short period of time, now BP improved. Cardiology and PCCM on board.   Subjective: Patient seen and examined, feeling much better than yesterday although continues to have difficulty breathing.  Pleurx was drained yesterday 1.5 L, patient with a lot of pain during drainage.  Remains afebrile.  Assessment & Plan: Diarrhea Concern for C. difficile, however stool studies were not sent.  Treating for C. difficile as her husband was positive for C. difficile recently.  Continue Flagyl for total of 14 days.  Diarrhea has improved  Essential hypertension --> hypotension Patient was hypotensive yesterday, felt to be related to A. fib and recent Pleurx insertion BP stable, she received phenylephrine for short period of time.  Hypotensive episode has resolved Continue current care.  Atrial fibrillation w/ RVR - converted to normal sinus rhythm Unclear what could have precipitated this event.  Cardiology was consulted and patient was started on amiodarone.  Per cardiology recommending to continue amiodarone for 24 more hours and if remains in sinus rhythm discontinue.  She is being anticoagulated with heparin drip which will be discontinued if patient remains in sinus rhythm.  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  Cytology shows atypical cells.  She is a status post Pleurx catheter.  Yesterday 1.5 L were drained.  Chest x-ray this morning without significant changes.  Pleura effusion  remains significant.  Will drain Pleurx up to 1.5 L today.  Premedicate with pain medication prior to procedure as patient was not tolerating well.  Remove fluids slowly to help with pain.   Metastatic non-small cell carcinoma Patient currently under the care of Dr. Julien Nordmann and Dr. Lisbeth Renshaw. Patient has been treated with systemic chemotherapy in addition to palliative radiotherapy. Last cycle of chemotherapy (Keytruda, Taxol and Paraplatin) was 06/12/17  Acute on chronic respiratory failure with hypoxia Secondary to lung cancer and recurrent effusion. Wean oxygen as able  Hypokalemia/hypomagnesemia Replete as needed Check BMP and magnesium in a.m.  DVT prophylaxis: Heparin ggt Code Status: Full code Family Communication: None at bedside Disposition Plan: Keep in stepdown while on amiodarone drip   Consultants:   Cardiology  PCM  Procedures:   Pleurx catheter 4/19  Antimicrobials: Anti-infectives (From admission, onward)   Start     Dose/Rate Route Frequency Ordered Stop   06/29/17 1500  ceFAZolin (ANCEF) IVPB 2g/100 mL premix     2 g 200 mL/hr over 30 Minutes Intravenous To Radiology 06/29/17 1020 06/29/17 1631   06/28/17 1500  metroNIDAZOLE (FLAGYL) tablet 500 mg     500 mg Oral Every 8 hours 06/28/17 1347     06/26/17 1200  metroNIDAZOLE (FLAGYL) IVPB 500 mg  Status:  Discontinued     500 mg 100 mL/hr over 60 Minutes Intravenous Every 8 hours 06/26/17 1026 06/28/17 1347   06/26/17 1030  metroNIDAZOLE (FLAGYL) IVPB 500 mg  Status:  Discontinued     500 mg 100 mL/hr over 60 Minutes Intravenous Every 8 hours 06/26/17 1022 06/26/17 1026        Objective: Vitals:   07/01/17 0145 07/01/17 0200 07/01/17 0413  07/01/17 0800  BP: (!) 109/52 120/61    Pulse: 95 97    Resp: (!) 24 (!) 22    Temp:   98.2 F (36.8 C) 98.1 F (36.7 C)  TempSrc:   Oral Oral  SpO2: 93% 93%    Weight:   80.1 kg (176 lb 9.4 oz)   Height:        Intake/Output Summary (Last 24 hours) at  07/01/2017 0905 Last data filed at 07/01/2017 6269 Gross per 24 hour  Intake 708.88 ml  Output 1250 ml  Net -541.12 ml   Filed Weights   06/30/17 0500 06/30/17 1327 07/01/17 0413  Weight: 76 kg (167 lb 8.8 oz) 78.8 kg (173 lb 11.6 oz) 80.1 kg (176 lb 9.4 oz)    Examination:  General exam: Appears calm and comfortable  HEENT: OP moist and clear  Respiratory system: Breath sounds diminished on the right side, good air entry on the left with mild crackles.  No wheezing Cardiovascular system: S1-S2, RRR, no murmurs Gastrointestinal system: Abdomen is nondistended, soft and nontender.  Central nervous system: Alert and oriented. No focal neurological deficits. Extremities: Trace lower extremity edema bilaterally Skin: No rashes Psychiatry: Mood & affect appropriate.    Data Reviewed: I have personally reviewed following labs and imaging studies  CBC: Recent Labs  Lab 06/26/17 0052 06/26/17 1322 06/28/17 0605 07/01/17 0348  WBC 1.5* 2.6* 5.7 7.5  NEUTROABS 0.8*  --   --   --   HGB 9.1* 9.5* 9.4* 8.6*  HCT 29.9* 30.7* 30.2* 27.7*  MCV 84.7 85.5 84.1 85.8  PLT 182 208 204 485   Basic Metabolic Panel: Recent Labs  Lab 06/26/17 0052 06/26/17 1322 06/28/17 0605 06/30/17 0609  NA 138 138 141 140  K 3.3* 3.6 3.1* 3.2*  CL 106 104 105 101  CO2 24 25 27 29   GLUCOSE 89 101* 102* 92  BUN 18 13 7 6   CREATININE 0.61 0.57 0.49 0.43*  CALCIUM 8.2* 8.1* 8.5* 8.5*  MG  --   --   --  1.7   GFR: Estimated Creatinine Clearance: 65.7 mL/min (A) (by C-G formula based on SCr of 0.43 mg/dL (L)). Liver Function Tests: Recent Labs  Lab 06/26/17 0052 06/26/17 1322  AST 21 27  ALT 16 18  ALKPHOS 76 81  BILITOT 0.7 0.7  PROT 5.9* 6.3*  ALBUMIN 2.6* 2.7*   No results for input(s): LIPASE, AMYLASE in the last 168 hours. No results for input(s): AMMONIA in the last 168 hours. Coagulation Profile: Recent Labs  Lab 06/28/17 0605  INR 1.21   Cardiac Enzymes: No results for  input(s): CKTOTAL, CKMB, CKMBINDEX, TROPONINI in the last 168 hours. BNP (last 3 results) No results for input(s): PROBNP in the last 8760 hours. HbA1C: No results for input(s): HGBA1C in the last 72 hours. CBG: No results for input(s): GLUCAP in the last 168 hours. Lipid Profile: No results for input(s): CHOL, HDL, LDLCALC, TRIG, CHOLHDL, LDLDIRECT in the last 72 hours. Thyroid Function Tests: No results for input(s): TSH, T4TOTAL, FREET4, T3FREE, THYROIDAB in the last 72 hours. Anemia Panel: No results for input(s): VITAMINB12, FOLATE, FERRITIN, TIBC, IRON, RETICCTPCT in the last 72 hours. Sepsis Labs: No results for input(s): PROCALCITON, LATICACIDVEN in the last 168 hours.  Recent Results (from the past 240 hour(s))  MRSA PCR Screening     Status: None   Collection Time: 06/30/17  7:44 PM  Result Value Ref Range Status   MRSA by PCR NEGATIVE NEGATIVE  Final    Comment:        The GeneXpert MRSA Assay (FDA approved for NASAL specimens only), is one component of a comprehensive MRSA colonization surveillance program. It is not intended to diagnose MRSA infection nor to guide or monitor treatment for MRSA infections. Performed at [facility], [place] [address] [place], [place] [address]       Radiology Studies: Ir Lenise Arena W Catheter Placement  Result Date: 06/29/2017 CLINICAL DATA:  Large malignant right pleural effusion EXAM: INSERTION OF TUNNELED PLEURAL DRAINAGE CATHETER THORACENTESIS RIGHT ANESTHESIA/SEDATION: Intravenous Fentanyl and Versed were administered as conscious sedation during continuous monitoring of the patient's level of consciousness and physiological / cardiorespiratory status by the radiology RN, with a total moderate sedation time of 10 minutes. Marland Kitchen MEDICATIONS: Cefazolin 2 g IV. Antibiotic was administered in an appropriate time interval for the procedure. FLUOROSCOPY TIME:  6 seconds; 1 mGy PROCEDURE: The procedure, risks, benefits,  and alternatives were explained to the patient. Questions regarding the procedure were encouraged and answered. The patient understands and consents to the procedure. Survey ultrasound of the right chest was performed an appropriate skin entry sites determined and marked. The right chest wall was prepped with chlorhexidine in a sterile fashion, and a sterile drape was applied covering the operative field. A sterile gown and sterile gloves were used for the procedure. Local anesthesia was provided with 1% Lidocaine. Ultrasound image documentation was performed. Fluoroscopy during the procedure and fluoroscopic spot radiograph confirms appropriate catheter position. After creating a small skin incision, a 19 gauge sheath needle was advanced into the pleural cavity under ultrasound guidance. A guide wire was then advanced under fluoroscopy into the pleural space. Pleural access was dilated serially and a 16-French peel-away sheath placed. A tunneled cuffed PleurX catheter was placed. This was tunneled from an incision 5 cm inferomedial to the pleural access to the access site. The catheter was advanced through the peel-away sheath. The sheath was then removed. Final catheter positioning was confirmed with a fluoroscopic spot image. The access incision was closed with Dermabond applied to the incision. A Prolene retention suture was applied at the catheter exit site. Large volume 1.5 L thoracentesis was performed through the new catheter, a sample sent for the requested laboratory studies. The patient tolerated the procedure well. COMPLICATIONS: None. FINDINGS: Large right pleural effusion confirmed on ultrasound. The catheter was placed via the right lateral chest wall. Catheter course is towards the apex. Approximately 1.5 liters of pleural fluid was able to be removed after catheter placement. The patient will be brought back in 10 to 14days to remove the temporary exit site retention suture after appropriate  in-growth around the subcutaneous cuff of the catheter. IMPRESSION: Placement of permanent, tunneled right pleural drainage catheter via lateral approach. 1.5 liters of pleural fluid was removed today after catheter placement. Electronically Signed   By: Lucrezia Europe M.D.   On: 06/29/2017 17:04   Ir US Guide Bx Asp/drain  Result Date: 06/29/2017 CLINICAL DATA:  Large malignant right pleural effusion EXAM: INSERTION OF TUNNELED PLEURAL DRAINAGE CATHETER THORACENTESIS RIGHT ANESTHESIA/SEDATION: Intravenous Fentanyl and Versed were administered as conscious sedation during continuous monitoring of the patient's level of consciousness and physiological / cardiorespiratory status by the radiology RN, with a total moderate sedation time of 10 minutes. Marland Kitchen MEDICATIONS: Cefazolin 2 g IV. Antibiotic was administered in an appropriate time interval for the procedure. FLUOROSCOPY TIME:  6 seconds; 1 mGy PROCEDURE: The procedure, risks, benefits, and alternatives were  explained to the patient. Questions regarding the procedure were encouraged and answered. The patient understands and consents to the procedure. Survey ultrasound of the right chest was performed an appropriate skin entry sites determined and marked. The right chest wall was prepped with chlorhexidine in a sterile fashion, and a sterile drape was applied covering the operative field. A sterile gown and sterile gloves were used for the procedure. Local anesthesia was provided with 1% Lidocaine. Ultrasound image documentation was performed. Fluoroscopy during the procedure and fluoroscopic spot radiograph confirms appropriate catheter position. After creating a small skin incision, a 19 gauge sheath needle was advanced into the pleural cavity under ultrasound guidance. A guide wire was then advanced under fluoroscopy into the pleural space. Pleural access was dilated serially and a 16-French peel-away sheath placed. A tunneled cuffed PleurX catheter was placed. This  was tunneled from an incision 5 cm inferomedial to the pleural access to the access site. The catheter was advanced through the peel-away sheath. The sheath was then removed. Final catheter positioning was confirmed with a fluoroscopic spot image. The access incision was closed with Dermabond applied to the incision. A Prolene retention suture was applied at the catheter exit site. Large volume 1.5 L thoracentesis was performed through the new catheter, a sample sent for the requested laboratory studies. The patient tolerated the procedure well. COMPLICATIONS: None. FINDINGS: Large right pleural effusion confirmed on ultrasound. The catheter was placed via the right lateral chest wall. Catheter course is towards the apex. Approximately 1.5 liters of pleural fluid was able to be removed after catheter placement. The patient will be brought back in 10 to 14days to remove the temporary exit site retention suture after appropriate in-growth around the subcutaneous cuff of the catheter. IMPRESSION: Placement of permanent, tunneled right pleural drainage catheter via lateral approach. 1.5 liters of pleural fluid was removed today after catheter placement. Electronically Signed   By: Lucrezia Europe M.D.   On: 06/29/2017 17:04   Dg Chest Port 1 View  Result Date: 06/30/2017 CLINICAL DATA:  Right pleural effusion. EXAM: PORTABLE CHEST 1 VIEW COMPARISON:  06/30/2017 FINDINGS: Continued complete opacification of the right hemithorax with right PleurX catheter remaining in place. No real change. No confluent opacity on the left. Heart is likely normal size. IMPRESSION: No significant change in the white out of the right lung. Electronically Signed   By: Rolm Baptise M.D.   On: 06/30/2017 18:56   Dg Chest Port 1 View  Result Date: 06/30/2017 CLINICAL DATA:  Shortness of breath today. Status post pleural drainage catheter placement 06/29/2017. Lung cancer. EXAM: PORTABLE CHEST 1 VIEW COMPARISON:  Single-view of the chest and  CT chest 06/27/2017. FINDINGS: Pleural drainage catheter on the right is identified. There is complete whiteout of the right chest which is unchanged. No focal airspace disease or effusion on the left. No pneumothorax. Cardiac silhouette is largely obscured. IMPRESSION: Persistent complete whiteout of the right chest consistent with a large effusion and airspace disease. Pleural drainage catheter is in place on the right. Electronically Signed   By: Inge Rise M.D.   On: 06/30/2017 12:11      Scheduled Meds: . albuterol  2.5 mg Nebulization TID  . amiodarone  150 mg Intravenous Once  . metoprolol tartrate  50 mg Oral BID  . metroNIDAZOLE  500 mg Oral Q8H  . sertraline  50 mg Oral Daily   Continuous Infusions: . amiodarone 30 mg/hr (07/01/17 0900)  . heparin 1,400 Units/hr (07/01/17 0426)  .  phenylephrine Stopped (07/01/17 0410)     LOS: 5 days    Time spent: Total of 35 minutes spent with pt, greater than 50% of which was spent in discussion of  treatment, counseling and coordination of care   Chipper Oman, MD Pager: Text Page via [email or web address]   If 7PM-7AM, please contact night-coverage [email or web address] 07/01/2017, 9:05 AM   Note - This record has been created using [employer]. Chart creation errors have been sought, but may not always have been located. Such creation errors do not reflect on the standard of medical care.

## 2017-07-01 NOTE — Progress Notes (Signed)
Progress Note  Patient Name: Cassidy Sanders Date of Encounter: 07/01/2017  Primary Cardiologist: Jenkins Rouge, MD   Subjective   Extreme pain when Pleur X drained yesterday afternoon   Inpatient Medications    Scheduled Meds: . albuterol  2.5 mg Nebulization TID  . amiodarone  150 mg Intravenous Once  . metoprolol tartrate  50 mg Oral BID  . metroNIDAZOLE  500 mg Oral Q8H  . sertraline  50 mg Oral Daily   Continuous Infusions: . amiodarone 30 mg/hr (06/30/17 2032)  . heparin 1,400 Units/hr (07/01/17 0426)  . phenylephrine Stopped (07/01/17 0410)   PRN Meds: acetaminophen **OR** acetaminophen, albuterol, diphenhydrAMINE   Vital Signs    Vitals:   07/01/17 0130 07/01/17 0145 07/01/17 0200 07/01/17 0413  BP: 118/65 (!) 109/52 120/61   Pulse: 98 95 97   Resp: (!) 24 (!) 24 (!) 22   Temp:    98.2 F (36.8 C)  TempSrc:    Oral  SpO2: 90% 93% 93%   Weight:    176 lb 9.4 oz (80.1 kg)  Height:        Intake/Output Summary (Last 24 hours) at 07/01/2017 0754 Last data filed at 07/01/2017 7096 Gross per 24 hour  Intake 708.88 ml  Output 1250 ml  Net -541.12 ml   Filed Weights   06/30/17 0500 06/30/17 1327 07/01/17 0413  Weight: 167 lb 8.8 oz (76 kg) 173 lb 11.6 oz (78.8 kg) 176 lb 9.4 oz (80.1 kg)    Telemetry    NSR rates 90's PVC 07/01/2017 - Personally Reviewed  ECG    Afib nonspecific ST changes  - Personally Reviewed  Physical Exam  Alopecia GEN: No acute distress.   Neck: No JVD Cardiac: RRR, no murmurs, rubs, or gallops.  Respiratory: Pleur X catheter right lung limited BS GI: Soft, nontender, non-distended  MS: No edema; No deformity. Neuro:  Nonfocal  Psych: Normal affect   Labs    Chemistry Recent Labs  Lab 06/26/17 0052 06/26/17 1322 06/28/17 0605 06/30/17 0609  NA 138 138 141 140  K 3.3* 3.6 3.1* 3.2*  CL 106 104 105 101  CO2 24 25 27 29   GLUCOSE 89 101* 102* 92  BUN 18 13 7 6   CREATININE 0.61 0.57 0.49 0.43*  CALCIUM 8.2*  8.1* 8.5* 8.5*  PROT 5.9* 6.3*  --   --   ALBUMIN 2.6* 2.7*  --   --   AST 21 27  --   --   ALT 16 18  --   --   ALKPHOS 76 81  --   --   BILITOT 0.7 0.7  --   --   GFRNONAA >60 >60 >60 >60  GFRAA >60 >60 >60 >60  ANIONGAP 8 9 9 10      Hematology Recent Labs  Lab 06/26/17 1322 06/28/17 0605 07/01/17 0348  WBC 2.6* 5.7 7.5  RBC 3.59* 3.59* 3.23*  HGB 9.5* 9.4* 8.6*  HCT 30.7* 30.2* 27.7*  MCV 85.5 84.1 85.8  MCH 26.5 26.2 26.6  MCHC 30.9 31.1 31.0  RDW 18.1* 17.7* 18.5*  PLT 208 204 225    Cardiac EnzymesNo results for input(s): TROPONINI in the last 168 hours. No results for input(s): TROPIPOC in the last 168 hours.   BNPNo results for input(s): BNP, PROBNP in the last 168 hours.   DDimer No results for input(s): DDIMER in the last 168 hours.   Radiology    Ir Lenise Arena W Catheter Placement  Result  Date: 06/29/2017 CLINICAL DATA:  Large malignant right pleural effusion EXAM: INSERTION OF TUNNELED PLEURAL DRAINAGE CATHETER THORACENTESIS RIGHT ANESTHESIA/SEDATION: Intravenous Fentanyl and Versed were administered as conscious sedation during continuous monitoring of the patient's level of consciousness and physiological / cardiorespiratory status by the radiology RN, with a total moderate sedation time of 10 minutes. Marland Kitchen MEDICATIONS: Cefazolin 2 g IV. Antibiotic was administered in an appropriate time interval for the procedure. FLUOROSCOPY TIME:  6 seconds; 1 mGy PROCEDURE: The procedure, risks, benefits, and alternatives were explained to the patient. Questions regarding the procedure were encouraged and answered. The patient understands and consents to the procedure. Survey ultrasound of the right chest was performed an appropriate skin entry sites determined and marked. The right chest wall was prepped with chlorhexidine in a sterile fashion, and a sterile drape was applied covering the operative field. A sterile gown and sterile gloves were used for the procedure. Local  anesthesia was provided with 1% Lidocaine. Ultrasound image documentation was performed. Fluoroscopy during the procedure and fluoroscopic spot radiograph confirms appropriate catheter position. After creating a small skin incision, a 19 gauge sheath needle was advanced into the pleural cavity under ultrasound guidance. A guide wire was then advanced under fluoroscopy into the pleural space. Pleural access was dilated serially and a 16-French peel-away sheath placed. A tunneled cuffed PleurX catheter was placed. This was tunneled from an incision 5 cm inferomedial to the pleural access to the access site. The catheter was advanced through the peel-away sheath. The sheath was then removed. Final catheter positioning was confirmed with a fluoroscopic spot image. The access incision was closed with Dermabond applied to the incision. A Prolene retention suture was applied at the catheter exit site. Large volume 1.5 L thoracentesis was performed through the new catheter, a sample sent for the requested laboratory studies. The patient tolerated the procedure well. COMPLICATIONS: None. FINDINGS: Large right pleural effusion confirmed on ultrasound. The catheter was placed via the right lateral chest wall. Catheter course is towards the apex. Approximately 1.5 liters of pleural fluid was able to be removed after catheter placement. The patient will be brought back in 10 to 14days to remove the temporary exit site retention suture after appropriate in-growth around the subcutaneous cuff of the catheter. IMPRESSION: Placement of permanent, tunneled right pleural drainage catheter via lateral approach. 1.5 liters of pleural fluid was removed today after catheter placement. Electronically Signed   By: Lucrezia Europe M.D.   On: 06/29/2017 17:04   Ir US Guide Bx Asp/drain  Result Date: 06/29/2017 CLINICAL DATA:  Large malignant right pleural effusion EXAM: INSERTION OF TUNNELED PLEURAL DRAINAGE CATHETER THORACENTESIS RIGHT  ANESTHESIA/SEDATION: Intravenous Fentanyl and Versed were administered as conscious sedation during continuous monitoring of the patient's level of consciousness and physiological / cardiorespiratory status by the radiology RN, with a total moderate sedation time of 10 minutes. Marland Kitchen MEDICATIONS: Cefazolin 2 g IV. Antibiotic was administered in an appropriate time interval for the procedure. FLUOROSCOPY TIME:  6 seconds; 1 mGy PROCEDURE: The procedure, risks, benefits, and alternatives were explained to the patient. Questions regarding the procedure were encouraged and answered. The patient understands and consents to the procedure. Survey ultrasound of the right chest was performed an appropriate skin entry sites determined and marked. The right chest wall was prepped with chlorhexidine in a sterile fashion, and a sterile drape was applied covering the operative field. A sterile gown and sterile gloves were used for the procedure. Local anesthesia was provided with 1% Lidocaine. Ultrasound image  documentation was performed. Fluoroscopy during the procedure and fluoroscopic spot radiograph confirms appropriate catheter position. After creating a small skin incision, a 19 gauge sheath needle was advanced into the pleural cavity under ultrasound guidance. A guide wire was then advanced under fluoroscopy into the pleural space. Pleural access was dilated serially and a 16-French peel-away sheath placed. A tunneled cuffed PleurX catheter was placed. This was tunneled from an incision 5 cm inferomedial to the pleural access to the access site. The catheter was advanced through the peel-away sheath. The sheath was then removed. Final catheter positioning was confirmed with a fluoroscopic spot image. The access incision was closed with Dermabond applied to the incision. A Prolene retention suture was applied at the catheter exit site. Large volume 1.5 L thoracentesis was performed through the new catheter, a sample sent for  the requested laboratory studies. The patient tolerated the procedure well. COMPLICATIONS: None. FINDINGS: Large right pleural effusion confirmed on ultrasound. The catheter was placed via the right lateral chest wall. Catheter course is towards the apex. Approximately 1.5 liters of pleural fluid was able to be removed after catheter placement. The patient will be brought back in 10 to 14days to remove the temporary exit site retention suture after appropriate in-growth around the subcutaneous cuff of the catheter. IMPRESSION: Placement of permanent, tunneled right pleural drainage catheter via lateral approach. 1.5 liters of pleural fluid was removed today after catheter placement. Electronically Signed   By: Lucrezia Europe M.D.   On: 06/29/2017 17:04   Dg Chest Port 1 View  Result Date: 06/30/2017 CLINICAL DATA:  Right pleural effusion. EXAM: PORTABLE CHEST 1 VIEW COMPARISON:  06/30/2017 FINDINGS: Continued complete opacification of the right hemithorax with right PleurX catheter remaining in place. No real change. No confluent opacity on the left. Heart is likely normal size. IMPRESSION: No significant change in the white out of the right lung. Electronically Signed   By: Rolm Baptise M.D.   On: 06/30/2017 18:56   Dg Chest Port 1 View  Result Date: 06/30/2017 CLINICAL DATA:  Shortness of breath today. Status post pleural drainage catheter placement 06/29/2017. Lung cancer. EXAM: PORTABLE CHEST 1 VIEW COMPARISON:  Single-view of the chest and CT chest 06/27/2017. FINDINGS: Pleural drainage catheter on the right is identified. There is complete whiteout of the right chest which is unchanged. No focal airspace disease or effusion on the left. No pneumothorax. Cardiac silhouette is largely obscured. IMPRESSION: Persistent complete whiteout of the right chest consistent with a large effusion and airspace disease. Pleural drainage catheter is in place on the right. Electronically Signed   By: Inge Rise M.D.    On: 06/30/2017 12:11    Cardiac Studies   Echo pending   Patient Profile     77 y.o. female with stage 4 non small cell lung cancer recurrent malignant right Pleural effusion post Pleur X catheter seen for PAF  Assessment & Plan    PAF:  Converted to NSR would continue amiodarone today then d/c if no recurrence can probably D/c heparin in 48 hours Echo pending but CT with no pericardial effusion   Pleural Effusion: should get MSO4 before draining given pain  Cdif Colits:  On metronidazole dehydration improved   For questions or updates, please contact [place] Please consult [email or web address] for contact info under Cardiology/STEMI.      Signed, Jenkins Rouge, MD  07/01/2017, 7:54 AM

## 2017-07-01 NOTE — Progress Notes (Signed)
[place] for IV heparin Indication: new AFib  Allergies  Allergen Reactions  . Latex Dermatitis  . Gabapentin Other (See Comments)    Excessive sleep   . Tape Rash    Patient Measurements: Height: 5\' 9"  (175.3 cm) Weight: 176 lb 9.4 oz (80.1 kg) IBW/kg (Calculated) : 66.2 Heparin Dosing Weight: TBW  Vital Signs: Temp: 98.1 F (36.7 C) (04/21 0800) Temp Source: Oral (04/21 0800) BP: 105/61 (04/21 1030) Pulse Rate: 107 (04/21 1030)  Labs: Recent Labs    06/30/17 0609 06/30/17 2040 07/01/17 0348 07/01/17 0850  HGB  --   --  8.6*  --   HCT  --   --  27.7*  --   PLT  --   --  225  --   HEPARINUNFRC  --  <0.10*  --  <0.10*  CREATININE 0.43*  --  0.47  --     Estimated Creatinine Clearance: 65.7 mL/min (by C-G formula based on SCr of 0.47 mg/dL).   Medical History: Past Medical History:  Diagnosis Date  . Brain tumor (benign) ([place])   . Depression   . Hypertension     Medications:  Medications Prior to Admission  Medication Sig Dispense Refill Last Dose  . albuterol (PROVENTIL HFA;VENTOLIN HFA) 108 (90 Base) MCG/ACT inhaler Inhale 2 puffs into the lungs every 4 (four) hours as needed for wheezing or shortness of breath. 1 Inhaler 12 Past Month at Unknown time  . Biotin 2.5 MG TABS Take 2,500 mcg by mouth daily.   06/25/2017 at Unknown time  . hydrochlorothiazide (MICROZIDE) 12.5 MG capsule Take 12.5 mg by mouth daily.  6 06/25/2017 at Unknown time  . loperamide (IMODIUM) 2 MG capsule Take 2 mg by mouth as needed for diarrhea or loose stools.   Past Week at Unknown time  . metoprolol tartrate (LOPRESSOR) 50 MG tablet Take 0.5 tablets (25 mg total) by mouth 2 (two) times daily. (Patient taking differently: Take 50 mg by mouth daily. ) 30 tablet 11 06/25/2017 at 0800  . sertraline (ZOLOFT) 50 MG tablet Take 50 mg by mouth daily.   06/25/2017 at Unknown time  . chlorpheniramine-HYDROcodone (TUSSIONEX) 10-8 MG/5ML SUER Take 5 mLs by  mouth every 12 (twelve) hours as needed for cough. (Patient not taking: Reported on 06/26/2017) 140 mL 0 Not Taking at Unknown time  . Lactulose 20 GM/30ML SOLN Take 30 mLs (20 g total) by mouth 4 (four) times daily as needed. (Patient not taking: Reported on 06/12/2017) 236 mL 0 Not Taking  . prochlorperazine (COMPAZINE) 10 MG tablet Take 1 tablet (10 mg total) by mouth every 6 (six) hours as needed for nausea or vomiting. (Patient not taking: Reported on 06/26/2017) 30 tablet 0 Not Taking at Unknown time   Scheduled:  . albuterol  2.5 mg Nebulization TID  . amiodarone  150 mg Intravenous Once  . heparin  3,000 Units Intravenous Once  . mouth rinse  15 mL Mouth Rinse BID  . metoprolol tartrate  50 mg Oral BID  . metroNIDAZOLE  500 mg Oral Q8H  . sertraline  50 mg Oral Daily   Infusions:  . amiodarone 30 mg/hr (07/01/17 0900)  . heparin    . phenylephrine Stopped (07/01/17 0410)   PRN: acetaminophen **OR** acetaminophen, albuterol, diphenhydrAMINE, oxyCODONE-acetaminophen  Assessment: 64 yoF with HTN, stage IV NSCLC with malignant R effusion, admitted for C diff colitis. Went into rapid Afib this AM; pharmacy consulted to dose heparin.   Today, 07/01/2017:  Heparin level undetectable despite rebolus and rate increase  CBC: Hgb low but stable; Plt stable WNL  No bleeding or infusion issues per nursing  CrCl: 61 ml/min  Goal of Therapy: Heparin level 0.3-0.7 units/ml Monitor platelets by anticoagulation protocol: Yes  Plan:  Heparin 3000 units IV bolus x 1  Increase heparin drip to 1600 units/hr IV infusion  Check heparin level in 8 hrs   Daily CBC, daily heparin level once stable  Monitor for signs of bleeding or thrombosis   Peggyann Juba, PharmD, BCPS Pager: [phone] 07/01/2017, 11:13 AM

## 2017-07-01 NOTE — Progress Notes (Signed)
MD made aware of patient's soft BP. 500 mL NS fluid bolus ordered. Per MD, if bolus does not improve BP, start phenylephrine. Will pass on report. Will continue to monitor.

## 2017-07-01 NOTE — Progress Notes (Signed)
[facility] for IV heparin Indication: new AFib  Allergies  Allergen Reactions  . Latex Dermatitis  . Gabapentin Other (See Comments)    Excessive sleep   . Tape Rash    Patient Measurements: Height: 5\' 9"  (175.3 cm) Weight: 176 lb 9.4 oz (80.1 kg) IBW/kg (Calculated) : 66.2 Heparin Dosing Weight: TBW  Vital Signs: Temp: 97.7 F (36.5 C) (04/21 1922) Temp Source: Oral (04/21 1922) BP: 89/53 (04/21 1809) Pulse Rate: 99 (04/21 1809)  Labs: Recent Labs    06/30/17 0609 06/30/17 2040 07/01/17 0348 07/01/17 0850 07/01/17 2139  HGB  --   --  8.6*  --   --   HCT  --   --  27.7*  --   --   PLT  --   --  225  --   --   HEPARINUNFRC  --  <0.10*  --  <0.10* 0.28*  CREATININE 0.43*  --  0.47  --   --     Estimated Creatinine Clearance: 65.7 mL/min (by C-G formula based on SCr of 0.47 mg/dL).   Medical History: Past Medical History:  Diagnosis Date  . Brain tumor (benign) ([place])   . Depression   . Hypertension     Medications:  Medications Prior to Admission  Medication Sig Dispense Refill Last Dose  . albuterol (PROVENTIL HFA;VENTOLIN HFA) 108 (90 Base) MCG/ACT inhaler Inhale 2 puffs into the lungs every 4 (four) hours as needed for wheezing or shortness of breath. 1 Inhaler 12 Past Month at Unknown time  . Biotin 2.5 MG TABS Take 2,500 mcg by mouth daily.   06/25/2017 at Unknown time  . hydrochlorothiazide (MICROZIDE) 12.5 MG capsule Take 12.5 mg by mouth daily.  6 06/25/2017 at Unknown time  . loperamide (IMODIUM) 2 MG capsule Take 2 mg by mouth as needed for diarrhea or loose stools.   Past Week at Unknown time  . metoprolol tartrate (LOPRESSOR) 50 MG tablet Take 0.5 tablets (25 mg total) by mouth 2 (two) times daily. (Patient taking differently: Take 50 mg by mouth daily. ) 30 tablet 11 06/25/2017 at 0800  . sertraline (ZOLOFT) 50 MG tablet Take 50 mg by mouth daily.   06/25/2017 at Unknown time  . chlorpheniramine-HYDROcodone  (TUSSIONEX) 10-8 MG/5ML SUER Take 5 mLs by mouth every 12 (twelve) hours as needed for cough. (Patient not taking: Reported on 06/26/2017) 140 mL 0 Not Taking at Unknown time  . Lactulose 20 GM/30ML SOLN Take 30 mLs (20 g total) by mouth 4 (four) times daily as needed. (Patient not taking: Reported on 06/12/2017) 236 mL 0 Not Taking  . prochlorperazine (COMPAZINE) 10 MG tablet Take 1 tablet (10 mg total) by mouth every 6 (six) hours as needed for nausea or vomiting. (Patient not taking: Reported on 06/26/2017) 30 tablet 0 Not Taking at Unknown time   Scheduled:  . albuterol  2.5 mg Nebulization TID  . amiodarone  150 mg Intravenous Once  . mouth rinse  15 mL Mouth Rinse BID  . metoprolol tartrate  50 mg Oral BID  . metroNIDAZOLE  500 mg Oral Q8H  . sertraline  50 mg Oral Daily   Infusions:  . amiodarone 30 mg/hr (07/01/17 2019)  . heparin    . phenylephrine Stopped (07/01/17 0410)   PRN: acetaminophen **OR** acetaminophen, albuterol, diphenhydrAMINE, oxyCODONE-acetaminophen  Assessment: 33 yoF with HTN, stage IV NSCLC with malignant R effusion, admitted for C diff colitis. Went into rapid Afib this AM; pharmacy consulted  to dose heparin.   Today, 07/01/2017:  Heparin level undetectable despite rebolus and rate increase  CBC: Hgb low but stable; Plt stable WNL  No bleeding or infusion issues per nursing  CrCl: 61 ml/min  2139 HL=0.28 below goal, no infusion or bleeding issues per RN.  Goal of Therapy: Heparin level 0.3-0.7 units/ml Monitor platelets by anticoagulation protocol: Yes  Plan:  Increase heparin drip to 1800 units/hr IV infusion  Check heparin level in 8 hrs   Daily CBC, daily heparin level once stable  Monitor for signs of bleeding or thrombosis    Dorrene German 07/01/2017, 10:28 PM

## 2017-07-01 NOTE — Progress Notes (Signed)
PleurX catheter drained at 1600H. 750 mL output, amber color. Patient tolerated better at a slower rate (compared to yesterday). Patient was also pre-medicated with Percocet. Order written to drain 1500 mL, however, draining slowed down and eventually stopped at 750 mL. MD made aware. MD stated that no follow up chest Xray order needed for today. Chest Xray will be performed tomorrow morning.  Will continue to monitor.

## 2017-07-01 NOTE — Progress Notes (Signed)
  Echocardiogram 2D Echocardiogram has been performed.  Merrie Roof F 07/01/2017, 1:23 PM

## 2017-07-02 ENCOUNTER — Ambulatory Visit (HOSPITAL_COMMUNITY): Payer: Medicare Other

## 2017-07-02 ENCOUNTER — Inpatient Hospital Stay (HOSPITAL_COMMUNITY): Payer: Medicare Other

## 2017-07-02 DIAGNOSIS — E876 Hypokalemia: Secondary | ICD-10-CM

## 2017-07-02 DIAGNOSIS — I951 Orthostatic hypotension: Secondary | ICD-10-CM

## 2017-07-02 DIAGNOSIS — J90 Pleural effusion, not elsewhere classified: Secondary | ICD-10-CM

## 2017-07-02 DIAGNOSIS — I959 Hypotension, unspecified: Secondary | ICD-10-CM

## 2017-07-02 DIAGNOSIS — I10 Essential (primary) hypertension: Secondary | ICD-10-CM

## 2017-07-02 LAB — CBC
HEMATOCRIT: 27.7 % — AB (ref 36.0–46.0)
HEMOGLOBIN: 8.7 g/dL — AB (ref 12.0–15.0)
MCH: 26.9 pg (ref 26.0–34.0)
MCHC: 31.4 g/dL (ref 30.0–36.0)
MCV: 85.5 fL (ref 78.0–100.0)
Platelets: 224 10*3/uL (ref 150–400)
RBC: 3.24 MIL/uL — AB (ref 3.87–5.11)
RDW: 18.5 % — ABNORMAL HIGH (ref 11.5–15.5)
WBC: 7.9 10*3/uL (ref 4.0–10.5)

## 2017-07-02 LAB — BASIC METABOLIC PANEL
Anion gap: 10 (ref 5–15)
BUN: 7 mg/dL (ref 6–20)
CHLORIDE: 104 mmol/L (ref 101–111)
CO2: 24 mmol/L (ref 22–32)
Calcium: 8 mg/dL — ABNORMAL LOW (ref 8.9–10.3)
Creatinine, Ser: 0.35 mg/dL — ABNORMAL LOW (ref 0.44–1.00)
GFR calc non Af Amer: >60 mL/min (ref >60)
Glucose, Bld: 113 mg/dL — ABNORMAL HIGH (ref 65–99)
POTASSIUM: 3.2 mmol/L — AB (ref 3.5–5.1)
SODIUM: 138 mmol/L (ref 135–145)

## 2017-07-02 LAB — MAGNESIUM: MAGNESIUM: 1.9 mg/dL (ref 1.7–2.4)

## 2017-07-02 LAB — HEPARIN LEVEL (UNFRACTIONATED)
HEPARIN UNFRACTIONATED: 0.31 [IU]/mL (ref 0.30–0.70)
Heparin Unfractionated: 0.31 IU/mL (ref 0.30–0.70)

## 2017-07-02 MED ORDER — HEPARIN (PORCINE) IN NACL 100-0.45 UNIT/ML-% IJ SOLN
1900.0000 [IU]/h | INTRAMUSCULAR | Status: DC
  Administered 2017-07-02 (×2): 1900 [IU]/h via INTRAVENOUS
  Filled 2017-07-02: qty 250

## 2017-07-02 MED ORDER — MAGNESIUM SULFATE 2 GM/50ML IV SOLN
2.0000 g | Freq: Once | INTRAVENOUS | Status: AC
  Administered 2017-07-02: 2 g via INTRAVENOUS
  Filled 2017-07-02: qty 50

## 2017-07-02 MED ORDER — SODIUM CHLORIDE 0.9 % IV BOLUS
500.0000 mL | Freq: Once | INTRAVENOUS | Status: AC
  Administered 2017-07-02: 500 mL via INTRAVENOUS

## 2017-07-02 MED ORDER — POTASSIUM CHLORIDE CRYS ER 10 MEQ PO TBCR
40.0000 meq | EXTENDED_RELEASE_TABLET | ORAL | Status: AC
  Administered 2017-07-02 (×2): 40 meq via ORAL
  Filled 2017-07-02 (×2): qty 4

## 2017-07-02 MED ORDER — POTASSIUM CHLORIDE CRYS ER 20 MEQ PO TBCR
40.0000 meq | EXTENDED_RELEASE_TABLET | ORAL | Status: DC
  Filled 2017-07-02: qty 2

## 2017-07-02 NOTE — Progress Notes (Signed)
PROGRESS NOTE [facility]   Nicolina Hirt   UVO:[id number] DOB: 09-Nov-1940  DOA: 06/25/2017 PCP: [name], MD   Brief Narrative:  Shevawn Langenberg a 77 y.o. female with a history of metastatic non-small cell carcinoma, hypertension. She presented with diarrhea and found to be dehydrated. Diarrhea has improved with initiation of metronidazole. S/p Pleurex, hospital course complicated with SOB, hypotension and Afib. Got neo for short period of time, now BP improved. Cardiology and PCCM on board.   Subjective: Patient seen and examined, doing well today.  She was hypotensive yesterday after Pleurx drainage.  She did receive 500 cc bolus and BP improved.  Chest x-ray with minimal improvement of right pleural effusion.  Assessment & Plan: Diarrhea Concern for C. difficile, however stool studies were not sent.  Treating for C. difficile as her husband was positive for C. difficile recently.  Continue Flagyl for total of 14 days.  Diarrhea has resolved  Essential hypertension --> hypotension Patient had hypotension again, this time felt to be related to drainage of PleurX. BP improved after 500 cc bolus. Continue to hold BP meds. Monitor BP closely   Atrial fibrillation w/ RVR - converted to normal sinus rhythm Unclear what could have precipitated this event.  Cardiology was consulted and patient was started on amiodarone. Per cardiology recommendations, stop amio today, she is off the drip this AM, will differ to card further management. C/w heparin ggt for now. Patient remains in [place].   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  Cytology shows atypical cells.  She is a status post Pleurx catheter.  Yesterday 750 cc were drained. CXR this AM slight improvement of R hemithorax. Will hold on drain today as she continues to become hypotensive with drainage. Follow PCCM recommendations.   Metastatic non-small cell carcinoma Patient currently under  the care of Dr. Julien Nordmann and Dr. Lisbeth Renshaw. Patient has been treated with systemic chemotherapy in addition to palliative radiotherapy. Last cycle of chemotherapy (Keytruda, Taxol and Paraplatin) was 06/12/17  Acute on chronic respiratory failure with hypoxia Secondary to lung cancer and recurrent effusion. Wean oxygen as able  Hypokalemia/hypomagnesemia Replete  Check levels in AM   DVT prophylaxis: Heparin ggt Code Status: Full code Family Communication: None at bedside Disposition Plan: Keep in SDU for now    Consultants:   Cardiology  PCM  Procedures:   Pleurx catheter 4/19  Antimicrobials: Anti-infectives (From admission, onward)   Start     Dose/Rate Route Frequency Ordered Stop   06/29/17 1500  ceFAZolin (ANCEF) IVPB 2g/100 mL premix     2 g 200 mL/hr over 30 Minutes Intravenous To Radiology 06/29/17 1020 06/29/17 1631   06/28/17 1500  metroNIDAZOLE (FLAGYL) tablet 500 mg     500 mg Oral Every 8 hours 06/28/17 1347     06/26/17 1200  metroNIDAZOLE (FLAGYL) IVPB 500 mg  Status:  Discontinued     500 mg 100 mL/hr over 60 Minutes Intravenous Every 8 hours 06/26/17 1026 06/28/17 1347   06/26/17 1030  metroNIDAZOLE (FLAGYL) IVPB 500 mg  Status:  Discontinued     500 mg 100 mL/hr over 60 Minutes Intravenous Every 8 hours 06/26/17 1022 06/26/17 1026       Objective: Vitals:   07/02/17 0530 07/02/17 0545 07/02/17 0600 07/02/17 0800  BP:   (!) 106/49   Pulse: 88 95 94   Resp: 20 19 20    Temp:   98 F (36.7 C) 97.7 F (36.5 C)  TempSrc:  Oral Oral  SpO2: 94% 93% 94%   Weight:      Height:        Intake/Output Summary (Last 24 hours) at 07/02/2017 0914 Last data filed at 07/02/2017 0119 Gross per 24 hour  Intake 1080.78 ml  Output 801 ml  Net 279.78 ml   Filed Weights   06/30/17 0500 06/30/17 1327 07/01/17 0413  Weight: 76 kg (167 lb 8.8 oz) 78.8 kg (173 lb 11.6 oz) 80.1 kg (176 lb 9.4 oz)    Examination:  General: NAD  Cardiovascular: RRR S1S2 no  murmurs  Respiratory: Decrease BS in R lung, apex clearing out, mild crackles on L base. Pleurex in place  Abdominal: Soft, NT, ND Extremities: No LE edema    Data Reviewed: I have personally reviewed following labs and imaging studies  CBC: Recent Labs  Lab 06/26/17 0052 06/26/17 1322 06/28/17 0605 07/01/17 0348 07/02/17 0356  WBC 1.5* 2.6* 5.7 7.5 7.9  NEUTROABS 0.8*  --   --   --   --   HGB 9.1* 9.5* 9.4* 8.6* 8.7*  HCT 29.9* 30.7* 30.2* 27.7* 27.7*  MCV 84.7 85.5 84.1 85.8 85.5  PLT 182 208 204 225 322   Basic Metabolic Panel: Recent Labs  Lab 06/26/17 1322 06/28/17 0605 06/30/17 0609 07/01/17 0348 07/02/17 0356  NA 138 141 140 138 138  K 3.6 3.1* 3.2* 3.5 3.2*  CL 104 105 101 105 104  CO2 25 27 29 26 24   GLUCOSE 101* 102* 92 102* 113*  BUN 13 7 6 8 7   CREATININE 0.57 0.49 0.43* 0.47 0.35*  CALCIUM 8.1* 8.5* 8.5* 8.0* 8.0*  MG  --   --  1.7 1.6* 1.9   GFR: Estimated Creatinine Clearance: 65.7 mL/min (A) (by C-G formula based on SCr of 0.35 mg/dL (L)). Liver Function Tests: Recent Labs  Lab 06/26/17 0052 06/26/17 1322  AST 21 27  ALT 16 18  ALKPHOS 76 81  BILITOT 0.7 0.7  PROT 5.9* 6.3*  ALBUMIN 2.6* 2.7*   No results for input(s): LIPASE, AMYLASE in the last 168 hours. No results for input(s): AMMONIA in the last 168 hours. Coagulation Profile: Recent Labs  Lab 06/28/17 0605  INR 1.21   Cardiac Enzymes: No results for input(s): CKTOTAL, CKMB, CKMBINDEX, TROPONINI in the last 168 hours. BNP (last 3 results) No results for input(s): PROBNP in the last 8760 hours. HbA1C: No results for input(s): HGBA1C in the last 72 hours. CBG: No results for input(s): GLUCAP in the last 168 hours. Lipid Profile: No results for input(s): CHOL, HDL, LDLCALC, TRIG, CHOLHDL, LDLDIRECT in the last 72 hours. Thyroid Function Tests: No results for input(s): TSH, T4TOTAL, FREET4, T3FREE, THYROIDAB in the last 72 hours. Anemia Panel: No results for input(s):  VITAMINB12, FOLATE, FERRITIN, TIBC, IRON, RETICCTPCT in the last 72 hours. Sepsis Labs: No results for input(s): PROCALCITON, LATICACIDVEN in the last 168 hours.  Recent Results (from the past 240 hour(s))  MRSA PCR Screening     Status: None   Collection Time: 06/30/17  7:44 PM  Result Value Ref Range Status   MRSA by PCR NEGATIVE NEGATIVE Final    Comment:        The GeneXpert MRSA Assay (FDA approved for NASAL specimens only), is one component of a comprehensive MRSA colonization surveillance program. It is not intended to diagnose MRSA infection nor to guide or monitor treatment for MRSA infections. Performed at [facility], [place] [address] [place], [place] [address]  Radiology Studies: Dg Chest Port 1 View  Result Date: 07/02/2017 CLINICAL DATA:  Follow-up right pleural effusion. EXAM: PORTABLE CHEST 1 VIEW COMPARISON:  Portable chest x-ray of July 01, 2017. FINDINGS: The left lung is well-expanded. There is mild shift of mediastinum toward the right which is stable. There is opacification of nearly all of the right hemithorax but a small amount of aerated right upper lung is present today. The right pleural drainage catheter tip projects over the medial aspect of the fourth and fifth posterior rib interspace. The left heart border is normal. The central pulmonary vascularity is mildly prominent. There is no left pleural effusion. IMPRESSION: Slight interval improvement in the appearance of the right hemithorax with a small amount of aeration of the right upper lobe. Stable appearance of the right pleural drainage catheter. Electronically Signed   By: David  Martinique M.D.   On: 07/02/2017 08:24   Dg Chest Port 1 View  Result Date: 07/01/2017 CLINICAL DATA:  Shortness of breath. Status post pleural drainage catheter 06/29/2017. History of lung cancer. EXAM: PORTABLE CHEST 1 VIEW COMPARISON:  Single-view of the chest 06/30/2017 and 06/27/2017. CT chest  06/27/2017. FINDINGS: Pleural drainage catheter is again seen. There is complete whiteout of the right chest consistent with the presence of a large effusion. 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 Cardiac silhouette is largely obscured. No pneumothorax. IMPRESSION: No change in complete whiteout of the right chest consistent with pleural effusion and airspace disease. Pleural drainage catheter is in place. No new abnormality. Electronically Signed   By: Inge Rise M.D.   On: 07/01/2017 10:58   Dg Chest Port 1 View  Result Date: 06/30/2017 CLINICAL DATA:  Right pleural effusion. EXAM: PORTABLE CHEST 1 VIEW COMPARISON:  06/30/2017 FINDINGS: Continued complete opacification of the right hemithorax with right PleurX catheter remaining in place. No real change. No confluent opacity on the left. Heart is likely normal size. IMPRESSION: No significant change in the white out of the right lung. Electronically Signed   By: Rolm Baptise M.D.   On: 06/30/2017 18:56   Dg Chest Port 1 View  Result Date: 06/30/2017 CLINICAL DATA:  Shortness of breath today. Status post pleural drainage catheter placement 06/29/2017. Lung cancer. EXAM: PORTABLE CHEST 1 VIEW COMPARISON:  Single-view of the chest and CT chest 06/27/2017. FINDINGS: Pleural drainage catheter on the right is identified. There is complete whiteout of the right chest which is unchanged. No focal airspace disease or effusion on the left. No pneumothorax. Cardiac silhouette is largely obscured. IMPRESSION: Persistent complete whiteout of the right chest consistent with a large effusion and airspace disease. Pleural drainage catheter is in place on the right. Electronically Signed   By: Inge Rise M.D.   On: 06/30/2017 12:11      Scheduled Meds: . albuterol  2.5 mg Nebulization TID  . amiodarone  150 mg Intravenous Once  . mouth rinse  15 mL Mouth Rinse BID  . metoprolol tartrate  50 mg Oral BID  . metroNIDAZOLE  500 mg Oral  Q8H  . sertraline  50 mg Oral Daily   Continuous Infusions: . amiodarone 30 mg/hr (07/01/17 2019)  . heparin 1,800 Units/hr (07/02/17 0851)  . phenylephrine Stopped (07/01/17 0410)     LOS: 6 days    Time spent: Total of 25 minutes spent with pt, greater than 50% of which was spent in discussion of  treatment, counseling and coordination of care   Chipper Oman, MD Pager: Text Page via  [email or web address]   If 7PM-7AM, please contact night-coverage [email or web address] 07/02/2017, 9:14 AM   Note - This record has been created using [employer]. Chart creation errors have been sought, but may not always have been located. Such creation errors do not reflect on the standard of medical care.

## 2017-07-02 NOTE — Progress Notes (Signed)
Progress Note  Patient Name: Cassidy Sanders Date of Encounter: 07/02/2017  Primary Cardiologist: Jenkins Rouge, MD   Subjective   No problems w/ PleurX, breathing ok, no LE edema. Amio is currently off.  Inpatient Medications    Scheduled Meds: . albuterol  2.5 mg Nebulization TID  . amiodarone  150 mg Intravenous Once  . mouth rinse  15 mL Mouth Rinse BID  . metoprolol tartrate  50 mg Oral BID  . metroNIDAZOLE  500 mg Oral Q8H  . sertraline  50 mg Oral Daily   Continuous Infusions: . amiodarone 30 mg/hr (07/01/17 2019)  . heparin 1,800 Units/hr (07/01/17 2303)  . phenylephrine Stopped (07/01/17 0410)   PRN Meds: acetaminophen **OR** acetaminophen, albuterol, diphenhydrAMINE, oxyCODONE-acetaminophen   Vital Signs    Vitals:   07/02/17 0515 07/02/17 0530 07/02/17 0545 07/02/17 0600  BP:    (!) 106/49  Pulse: 89 88 95 94  Resp: 19 20 19 20   Temp:    98 F (36.7 C)  TempSrc:    Oral  SpO2: 93% 94% 93% 94%  Weight:      Height:        Intake/Output Summary (Last 24 hours) at 07/02/2017 0750 Last data filed at 07/02/2017 0119 Gross per 24 hour  Intake 1320.78 ml  Output 801 ml  Net 519.78 ml   Filed Weights   06/30/17 0500 06/30/17 1327 07/01/17 0413  Weight: 167 lb 8.8 oz (76 kg) 173 lb 11.6 oz (78.8 kg) 176 lb 9.4 oz (80.1 kg)    Telemetry    NSR rates 90's PVC 07/02/2017 - Personally Reviewed  ECG    Afib nonspecific ST changes  - Personally Reviewed  Physical Exam  Alopecia GEN: No acute distress.   Neck: No JVD Cardiac: RRR, no murmurs, rubs, or gallops.  Respiratory: Pleur X catheter right lung limited BS, decreased BS R GI: Soft, nontender, non-distended  MS: No edema; No deformity. Neuro:  Nonfocal  Psych: Normal affect   Labs    Chemistry Recent Labs  Lab 06/26/17 0052 06/26/17 1322  06/30/17 0609 07/01/17 0348 07/02/17 0356  NA 138 138   < > 140 138 138  K 3.3* 3.6   < > 3.2* 3.5 3.2*  CL 106 104   < > 101 105 104  CO2 24  25   < > 29 26 24   GLUCOSE 89 101*   < > 92 102* 113*  BUN 18 13   < > 6 8 7   CREATININE 0.61 0.57   < > 0.43* 0.47 0.35*  CALCIUM 8.2* 8.1*   < > 8.5* 8.0* 8.0*  PROT 5.9* 6.3*  --   --   --   --   ALBUMIN 2.6* 2.7*  --   --   --   --   AST 21 27  --   --   --   --   ALT 16 18  --   --   --   --   ALKPHOS 76 81  --   --   --   --   BILITOT 0.7 0.7  --   --   --   --   GFRNONAA >60 >60   < > >60 >60 >60  GFRAA >60 >60   < > >60 >60 >60  ANIONGAP 8 9   < > 10 7 10    < > = values in this interval not displayed.     Hematology Recent Labs  Lab  06/28/17 0605 07/01/17 0348 07/02/17 0356  WBC 5.7 7.5 7.9  RBC 3.59* 3.23* 3.24*  HGB 9.4* 8.6* 8.7*  HCT 30.2* 27.7* 27.7*  MCV 84.1 85.8 85.5  MCH 26.2 26.6 26.9  MCHC 31.1 31.0 31.4  RDW 17.7* 18.5* 18.5*  PLT 204 225 224     Radiology    Dg Chest Port 1 View  Result Date: 07/01/2017 CLINICAL DATA:  Shortness of breath. Status post pleural drainage catheter 06/29/2017. History of lung cancer. EXAM: PORTABLE CHEST 1 VIEW COMPARISON:  Single-view of the chest 06/30/2017 and 06/27/2017. CT chest 06/27/2017. FINDINGS: Pleural drainage catheter is again seen. There is complete whiteout of the right chest consistent with the presence of a large effusion. 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 Cardiac silhouette is largely obscured. No pneumothorax. IMPRESSION: No change in complete whiteout of the right chest consistent with pleural effusion and airspace disease. Pleural drainage catheter is in place. No new abnormality. Electronically Signed   By: Inge Rise M.D.   On: 07/01/2017 10:58   Dg Chest Port 1 View  Result Date: 06/30/2017 CLINICAL DATA:  Right pleural effusion. EXAM: PORTABLE CHEST 1 VIEW COMPARISON:  06/30/2017 FINDINGS: Continued complete opacification of the right hemithorax with right PleurX catheter remaining in place. No real change. No confluent opacity on the left. Heart is likely normal size.  IMPRESSION: No significant change in the white out of the right lung. Electronically Signed   By: Rolm Baptise M.D.   On: 06/30/2017 18:56   Dg Chest Port 1 View  Result Date: 06/30/2017 CLINICAL DATA:  Shortness of breath today. Status post pleural drainage catheter placement 06/29/2017. Lung cancer. EXAM: PORTABLE CHEST 1 VIEW COMPARISON:  Single-view of the chest and CT chest 06/27/2017. FINDINGS: Pleural drainage catheter on the right is identified. There is complete whiteout of the right chest which is unchanged. No focal airspace disease or effusion on the left. No pneumothorax. Cardiac silhouette is largely obscured. IMPRESSION: Persistent complete whiteout of the right chest consistent with a large effusion and airspace disease. Pleural drainage catheter is in place on the right. Electronically Signed   By: Inge Rise M.D.   On: 06/30/2017 12:11    Cardiac Studies   Echo 07/01/2017 - Left ventricle: The cavity size was normal. Wall thickness was   increased in a pattern of moderate LVH. Systolic function was   normal. The estimated ejection fraction was in the range of 55%   to 60%. Features are consistent with a pseudonormal left   ventricular filling pattern, with concomitant abnormal relaxation   and increased filling pressure (grade 2 diastolic dysfunction).   Doppler parameters are consistent with high ventricular filling   pressure. - Aortic valve: Mildly to moderately calcified annulus. Trileaflet;   moderately thickened leaflets. Valve area (VTI): 2.29 cm^2. Valve   area (Vmax): 2.01 cm^2. Valve area (Vmean): 2.06 cm^2. - Mitral valve: Mildly calcified annulus. Mildly thickened leaflets - Left atrium: The atrium was severely dilated. - Right ventricle: The cavity size was mildly dilated. Wall   thickness was normal. - Right atrium: The atrium was mildly dilated. - Pulmonary arteries: Systolic pressure was moderately increased.   PA peak pressure: 45 mm Hg (S). -  Technically difficult study.   Patient Profile     77 y.o. female with HTN, stage 4 non small cell lung cancer recurrent malignant right pleural effusion post Pleur X catheter, C diff colitis, seen for PAF  Assessment & Plan  PAF:  Converted to NSR on amiodarone - Dr Johnsie Cancel mentions possible d/c amio if no recurrence  - However, L atrium severely dilated, discuss change to po w/ MD - Echo w/ no pericardial effusion  - heparin started 04/20, discuss if can d/c or if pt needs DOAC  Pleural Effusion: had pain w/ drainage, consider MSO4 before draining given pain - 750 cc out 04/21  Cdif Colits:  On metronidazole, dehydration improved   For questions or updates, please contact [place] Please consult [email or web address] for contact info under Cardiology/STEMI.      Signed, Rosaria Ferries, PA-C  07/02/2017, 7:50 AM

## 2017-07-02 NOTE — Progress Notes (Signed)
ANTICOAGULATION CONSULT NOTE - Follow Up Consult  Pharmacy Consult for Heparin Indication: atrial fibrillation  Patient Measurements: Height: 5\' 9"  (175.3 cm) Weight: 176 lb 9.4 oz (80.1 kg) IBW/kg (Calculated) : 66.2 Heparin Dosing Weight: 78.8 kg  Labs: Recent Labs    06/30/17 0609  07/01/17 0348 07/01/17 0850 07/01/17 2139 07/02/17 0356 07/02/17 0802  HGB  --   --  8.6*  --   --  8.7*  --   HCT  --   --  27.7*  --   --  27.7*  --   PLT  --   --  225  --   --  224  --   HEPARINUNFRC  --    < >  --  <0.10* 0.28*  --  0.31  CREATININE 0.43*  --  0.47  --   --  0.35*  --    < > = values in this interval not displayed.    Estimated Creatinine Clearance: 65.7 mL/min (A) (by C-G formula based on SCr of 0.35 mg/dL (L)).   Medications:  Infusions:  . heparin 1,900 Units/hr (07/02/17 1222)  . phenylephrine Stopped (07/01/17 0410)    Assessment: 85 yoF with HTN, stage IV NSCLC with malignant R effusion, admitted for C diff colitis.  Pharmacy is consulted to dose Heparin for new onset Afib.  Heparin level 0.31, low end of goal range on heparin at 1900 units/hr. - Heparin level remains at low end of goal range despite previous rate increase from 1800 to 1900 units/hr. No bleeding or complications reported  Goal of Therapy:  Heparin level 0.3-0.7 units/ml Monitor platelets by anticoagulation protocol: Yes   Plan:  Continue heparin IV infusion at 1900 units/hr Heparin level in 8 hours to confirm therapeutic rate Daily heparin level and CBC   Gretta Arab PharmD, BCPS Pager [phone] 07/02/2017 7:44 PM

## 2017-07-02 NOTE — Progress Notes (Addendum)
[place] for IV heparin Indication: new AFib  Allergies  Allergen Reactions  . Latex Dermatitis  . Gabapentin Other (See Comments)    Excessive sleep   . Tape Rash   Patient Measurements: Height: 5\' 9"  (175.3 cm) Weight: 176 lb 9.4 oz (80.1 kg) IBW/kg (Calculated) : 66.2 Heparin Dosing Weight: TBW  Vital Signs: Temp: 97.7 F (36.5 C) (04/22 0800) Temp Source: Oral (04/22 0800) BP: 106/49 (04/22 0600) Pulse Rate: 94 (04/22 0600)  Labs: Recent Labs    06/30/17 0609  07/01/17 0348 07/01/17 0850 07/01/17 2139 07/02/17 0356 07/02/17 0802  HGB  --   --  8.6*  --   --  8.7*  --   HCT  --   --  27.7*  --   --  27.7*  --   PLT  --   --  225  --   --  224  --   HEPARINUNFRC  --    < >  --  <0.10* 0.28*  --  0.31  CREATININE 0.43*  --  0.47  --   --  0.35*  --    < > = values in this interval not displayed.   Estimated Creatinine Clearance: 65.7 mL/min (A) (by C-G formula based on SCr of 0.35 mg/dL (L)).  Medical History: Past Medical History:  Diagnosis Date  . Brain tumor (benign) ([place])   . Depression   . Hypertension    Medications:  Scheduled:  . albuterol  2.5 mg Nebulization TID  . amiodarone  150 mg Intravenous Once  . mouth rinse  15 mL Mouth Rinse BID  . metoprolol tartrate  50 mg Oral BID  . metroNIDAZOLE  500 mg Oral Q8H  . potassium chloride  40 mEq Oral Q4H  . sertraline  50 mg Oral Daily   Infusions:  . amiodarone 30 mg/hr (07/01/17 2019)  . heparin 1,800 Units/hr (07/02/17 0851)  . magnesium sulfate 1 - 4 g bolus IVPB    . phenylephrine Stopped (07/01/17 0410)   Assessment: 6 yoF with HTN, stage IV NSCLC with malignant R effusion, admitted for C diff colitis. Went into rapid Afib this AM; pharmacy consulted to dose heparin.   Today, 07/02/2017:  4/22 0800 Hep level = 0.31 units/ml, very low therapeutic range  CBC: Hgb low but stable; Plt stable WNL  No bleeding or infusion issues per nursing  4/21  level 2139 =0.28 below goal, no infusion/bleeding issues per RN, incr rate to 1800 units/hr  Goal of Therapy: Heparin level 0.3-0.7 units/ml Monitor platelets by anticoagulation protocol: Yes  Plan:  Increase Heparin drip t0 1900 units/hr  Per Cards, continue Heparin, aware in NSR  Heparin level at 2000  Daily CBC, daily heparin level once stable  Monitor for signs of bleeding or thrombosis  Minda Ditto PharmD Pager [phone] 07/02/2017, 10:10 AM

## 2017-07-02 NOTE — Progress Notes (Signed)
PULMONARY / CRITICAL CARE MEDICINE   Name: Cassidy Sanders MRN: [id number] DOB: March 23, 1940    ADMISSION DATE:  06/25/2017 CONSULTATION DATE:  06/29/2017  REFERRING MD:  Lonny Prude - [facility]  CHIEF COMPLAINT: Right pleural effusion.  HISTORY OF PRESENT ILLNESS:   77 year old woman with a history of non-small cell lung cancer.  She was admitted with protracted diarrhea and was found to have a C. difficile colitis.  Diarrhea has now settled on treatment.  She also complained of increasing shortness of breath and is known to have a right pleural effusion previously seen by Dr. Lake Bells and which had previously been tapped 3/1 >> atypical cells.  On presentation this time however the effusion now filled her right hemithorax completely.  Imaging  CT scan performed on 4-17 showsRecurrent large right pleural effusion with complete right lung collapse/consolidation now obscuring the known right hilar/mediastinal lung mass, I reviewed the CT myself  Significant  events  4/19 pleurx catheter placement by IR >> 1.5 L drained  SUBJECTIVE:  No events overnight, no new complaints  VITAL SIGNS: BP (!) 106/49   Pulse 94   Temp 97.7 F (36.5 C) (Oral)   Resp 20   Ht 5\' 9"  (1.753 m)   Wt 176 lb 9.4 oz (80.1 kg)   SpO2 94%   BMI 26.08 kg/m   INTAKE / OUTPUT: I/O last 3 completed shifts: In: 1813.1 [P.O.:240; I.V.:773.1; IV Piggyback:800] Out: 801 [Urine:50; Stool:1; Chest Tube:750]  PHYSICAL EXAMINATION: General: Thin frail woman in mild respiratory distress, but able to speak in full sentences. Neuro: Alert and oriented.  Non focal HEENT: alopecia.  Scleral pallor.+ no icterus  Cardiovascular: First and second heart sounds are unremarkable.  No edema.  JVP not elevated. Lungs: decreased on right, dull to percuss Abdomen: Abdomen is soft and nontender. Musculoskeletal: no  Joint swelling Skin: Skin is intact  LABS:  BMET Recent Labs  Lab 06/30/17 0609 07/01/17 0348  07/02/17 0356  NA 140 138 138  K 3.2* 3.5 3.2*  CL 101 105 104  CO2 29 26 24   BUN 6 8 7   CREATININE 0.43* 0.47 0.35*  GLUCOSE 92 102* 113*   Electrolytes Recent Labs  Lab 06/30/17 0609 07/01/17 0348 07/02/17 0356  CALCIUM 8.5* 8.0* 8.0*  MG 1.7 1.6* 1.9   CBC Recent Labs  Lab 06/28/17 0605 07/01/17 0348 07/02/17 0356  WBC 5.7 7.5 7.9  HGB 9.4* 8.6* 8.7*  HCT 30.2* 27.7* 27.7*  PLT 204 225 224   Coag's Recent Labs  Lab 06/28/17 0605  APTT 34  INR 1.21   Sepsis Markers No results for input(s): LATICACIDVEN, PROCALCITON, O2SATVEN in the last 168 hours.  ABG No results for input(s): PHART, PCO2ART, PO2ART in the last 168 hours.  Liver Enzymes Recent Labs  Lab 06/26/17 0052 06/26/17 1322  AST 21 27  ALT 16 18  ALKPHOS 76 81  BILITOT 0.7 0.7  ALBUMIN 2.6* 2.7*   Cardiac Enzymes No results for input(s): TROPONINI, PROBNP in the last 168 hours.  Glucose No results for input(s): GLUCAP in the last 168 hours.  I reviewed CXR myself, pleural effusion noted with pleurex catheter in place  DISCUSSION: This 78 year old lady has recurrent large pleural effusion undoubtedly related to her non-small cell lung cancer. Atypical cells on previous  fluid cytology.  S/p pleurX.  Discussed with PCCM-NP.  ASSESSMENT / PLAN:  Right pleural effusion  - Drain on a PRN bases at this point  - CXR in AM to evaluate fluid  status  - Likely will drain in AM  Hypokalemia  - K-dur 40 meq PO x2 doses  - BMET in AM  Hypomag  - 2gm IV x1  - Recheck in AM  AF-RVR   - Now in NSR, ?d/c amiodarone  - Defer to cards  Hypotension - related to above, resolved with fluids  C diff colitis - oral flagyl   Rush Farmer, M.D. [facility] Pulmonary/Critical Care Medicine. Pager: [phone]. After hours pager: [phone].  07/02/2017, 9:13 AM

## 2017-07-03 ENCOUNTER — Telehealth: Payer: Self-pay | Admitting: Internal Medicine

## 2017-07-03 ENCOUNTER — Inpatient Hospital Stay (HOSPITAL_COMMUNITY): Payer: Medicare Other

## 2017-07-03 ENCOUNTER — Telehealth: Payer: Self-pay | Admitting: Medical Oncology

## 2017-07-03 DIAGNOSIS — J9801 Acute bronchospasm: Secondary | ICD-10-CM

## 2017-07-03 LAB — BASIC METABOLIC PANEL
Anion gap: 7 (ref 5–15)
BUN: 5 mg/dL — AB (ref 6–20)
CALCIUM: 8.2 mg/dL — AB (ref 8.9–10.3)
CO2: 24 mmol/L (ref 22–32)
CREATININE: 0.42 mg/dL — AB (ref 0.44–1.00)
Chloride: 109 mmol/L (ref 101–111)
GFR calc non Af Amer: >60 mL/min (ref >60)
Glucose, Bld: 100 mg/dL — ABNORMAL HIGH (ref 65–99)
Potassium: 4 mmol/L (ref 3.5–5.1)
SODIUM: 140 mmol/L (ref 135–145)

## 2017-07-03 LAB — CBC
HCT: 29.3 % — ABNORMAL LOW (ref 36.0–46.0)
Hemoglobin: 8.8 g/dL — ABNORMAL LOW (ref 12.0–15.0)
MCH: 25.7 pg — AB (ref 26.0–34.0)
MCHC: 30 g/dL (ref 30.0–36.0)
MCV: 85.7 fL (ref 78.0–100.0)
Platelets: 239 10*3/uL (ref 150–400)
RBC: 3.42 MIL/uL — ABNORMAL LOW (ref 3.87–5.11)
RDW: 18.8 % — AB (ref 11.5–15.5)
WBC: 6.4 10*3/uL (ref 4.0–10.5)

## 2017-07-03 LAB — MAGNESIUM: MAGNESIUM: 1.9 mg/dL (ref 1.7–2.4)

## 2017-07-03 LAB — HEPARIN LEVEL (UNFRACTIONATED): Heparin Unfractionated: 0.47 IU/mL (ref 0.30–0.70)

## 2017-07-03 LAB — PHOSPHORUS: PHOSPHORUS: 2.8 mg/dL (ref 2.5–4.6)

## 2017-07-03 MED ORDER — MAGNESIUM SULFATE 2 GM/50ML IV SOLN
2.0000 g | Freq: Once | INTRAVENOUS | Status: AC
  Administered 2017-07-03: 2 g via INTRAVENOUS
  Filled 2017-07-03: qty 50

## 2017-07-03 MED ORDER — METOPROLOL TARTRATE 25 MG PO TABS
25.0000 mg | ORAL_TABLET | Freq: Two times a day (BID) | ORAL | Status: DC
  Administered 2017-07-03 – 2017-07-04 (×3): 25 mg via ORAL
  Filled 2017-07-03 (×3): qty 1

## 2017-07-03 NOTE — Progress Notes (Addendum)
PROGRESS NOTE [facility]   Cassidy Sanders   PNT:[id number] DOB: 14-Dec-1940  DOA: 06/25/2017 PCP: [name], MD   Brief Narrative:  Cassidy Sanders a 77 y.o. female with a history of metastatic non-small cell carcinoma, hypertension. She presented with diarrhea and found to be dehydrated. Diarrhea has improved with initiation of metronidazole. S/p Pleurex, hospital course complicated with SOB, hypotension and Afib. Got neo for short period of time, now BP improved. Cardiology and PCCM on board.   Subjective: Patient seen and examined, blood pressure has improved, chest x-ray slight better today.  Continues with persistent cough.  Remains afebrile.  No diarrhea  Assessment & Plan: Diarrhea - resolved Concern for C. difficile, however stool studies were not sent.  Treating for C. difficile as her husband was positive for C. difficile recently.  Continue Flagyl for total of 14 days.  Essential hypertension --> hypotension Blood pressure stable, Lopressor is remains on hold may need to add low dose to help with tachycardia.  Will defer to cardiology.  Continue to monitor BP closely  Atrial fibrillation w/ RVR - converted to normal sinus rhythm Unclear what could have cause this, She remains in SR with multiple PVC's and tachycardia at times. Patient is off amio and per card will continue to hold on this unless she develops another Afib episode. She was on lopressor which was stopped by cards, may need low dose given tachy at times. Regarding A/c there is no concern regarding the pleural effusion and a/c, however she is now having blood in the urine.  Since A. fib lasted less than 48-hour and this is her first episode she might not need anticoagulation at all.  If anticoagulation indicated per cardiology recommendation okay to use Eliquis.  Given current hematuria I would recommend to hold Heparin for now. Dicussed with cards   Large right sided effusion Recurrent pleural effusion  likely from malignancy.  Cytology shows atypical cells.  She is a status post Pleurx catheter.  Chest x-ray from this morning with slight improvement from yesterday, still persistent pleural effusion.  Will drain 500 cc today to avoid hypotension.  Continue management per PCCM  Metastatic non-small cell carcinoma Patient currently under the care of Dr. Julien Nordmann and Dr. Lisbeth Renshaw. Patient has been treated with systemic chemotherapy in addition to palliative radiotherapy. Last cycle of chemotherapy (Keytruda, Taxol and Paraplatin) was 06/12/17  Acute on chronic respiratory failure with hypoxia Secondary to lung cancer and recurrent effusion. Wean oxygen as able  Hypokalemia/hypomagnesemia Resolved  DVT prophylaxis: Heparin ggt Code Status: Full code Family Communication: None at bedside Disposition Plan: Keeping SDU for now   Consultants:   Cardiology  PCM  Procedures:   Pleurx catheter 4/19  Antimicrobials: Anti-infectives (From admission, onward)   Start     Dose/Rate Route Frequency Ordered Stop   06/29/17 1500  ceFAZolin (ANCEF) IVPB 2g/100 mL premix     2 g 200 mL/hr over 30 Minutes Intravenous To Radiology 06/29/17 1020 06/29/17 1631   06/28/17 1500  metroNIDAZOLE (FLAGYL) tablet 500 mg     500 mg Oral Every 8 hours 06/28/17 1347     06/26/17 1200  metroNIDAZOLE (FLAGYL) IVPB 500 mg  Status:  Discontinued     500 mg 100 mL/hr over 60 Minutes Intravenous Every 8 hours 06/26/17 1026 06/28/17 1347   06/26/17 1030  metroNIDAZOLE (FLAGYL) IVPB 500 mg  Status:  Discontinued     500 mg 100 mL/hr over 60 Minutes Intravenous Every 8 hours 06/26/17 1022 06/26/17 1026  Objective: Vitals:   07/03/17 0326 07/03/17 0400 07/03/17 0500 07/03/17 0725  BP:  (!) 150/82    Pulse:  (!) 103    Resp:  (!) 9    Temp:    98.2 F (36.8 C)  TempSrc:    Oral  SpO2: 93% 95%    Weight:   81.4 kg (179 lb 7.3 oz)   Height:        Intake/Output Summary (Last 24 hours) at 07/03/2017  0830 Last data filed at 07/03/2017 0602 Gross per 24 hour  Intake 954.66 ml  Output 1000 ml  Net -45.34 ml   Filed Weights   06/30/17 1327 07/01/17 0413 07/03/17 0500  Weight: 78.8 kg (173 lb 11.6 oz) 80.1 kg (176 lb 9.4 oz) 81.4 kg (179 lb 7.3 oz)    Examination:  General: Pt is alert, awake, not in acute distress Cardiovascular: S1-S2, tachycardia at 110, no murmurs Respiratory: Decreased breath sound, right> left, mild improvement on air entry from yesterday.  Bibasilar crackles, positive cough Abdominal: Soft, NT, ND Extremities: no edema   Data Reviewed: I have personally reviewed following labs and imaging studies  CBC: Recent Labs  Lab 06/26/17 1322 06/28/17 0605 07/01/17 0348 07/02/17 0356 07/03/17 0518  WBC 2.6* 5.7 7.5 7.9 6.4  HGB 9.5* 9.4* 8.6* 8.7* 8.8*  HCT 30.7* 30.2* 27.7* 27.7* 29.3*  MCV 85.5 84.1 85.8 85.5 85.7  PLT 208 204 225 224 366   Basic Metabolic Panel: Recent Labs  Lab 06/28/17 0605 06/30/17 0609 07/01/17 0348 07/02/17 0356 07/03/17 0518  NA 141 140 138 138 140  K 3.1* 3.2* 3.5 3.2* 4.0  CL 105 101 105 104 109  CO2 27 29 26 24 24   GLUCOSE 102* 92 102* 113* 100*  BUN 7 6 8 7  5*  CREATININE 0.49 0.43* 0.47 0.35* 0.42*  CALCIUM 8.5* 8.5* 8.0* 8.0* 8.2*  MG  --  1.7 1.6* 1.9 1.9  PHOS  --   --   --   --  2.8   GFR: Estimated Creatinine Clearance: 66.1 mL/min (A) (by C-G formula based on SCr of 0.42 mg/dL (L)). Liver Function Tests: Recent Labs  Lab 06/26/17 1322  AST 27  ALT 18  ALKPHOS 81  BILITOT 0.7  PROT 6.3*  ALBUMIN 2.7*   No results for input(s): LIPASE, AMYLASE in the last 168 hours. No results for input(s): AMMONIA in the last 168 hours. Coagulation Profile: Recent Labs  Lab 06/28/17 0605  INR 1.21   Cardiac Enzymes: No results for input(s): CKTOTAL, CKMB, CKMBINDEX, TROPONINI in the last 168 hours. BNP (last 3 results) No results for input(s): PROBNP in the last 8760 hours. HbA1C: No results for  input(s): HGBA1C in the last 72 hours. CBG: No results for input(s): GLUCAP in the last 168 hours. Lipid Profile: No results for input(s): CHOL, HDL, LDLCALC, TRIG, CHOLHDL, LDLDIRECT in the last 72 hours. Thyroid Function Tests: No results for input(s): TSH, T4TOTAL, FREET4, T3FREE, THYROIDAB in the last 72 hours. Anemia Panel: No results for input(s): VITAMINB12, FOLATE, FERRITIN, TIBC, IRON, RETICCTPCT in the last 72 hours. Sepsis Labs: No results for input(s): PROCALCITON, LATICACIDVEN in the last 168 hours.  Recent Results (from the past 240 hour(s))  MRSA PCR Screening     Status: None   Collection Time: 06/30/17  7:44 PM  Result Value Ref Range Status   MRSA by PCR NEGATIVE NEGATIVE Final    Comment:        The GeneXpert MRSA Assay (FDA  approved for NASAL specimens only), is one component of a comprehensive MRSA colonization surveillance program. It is not intended to diagnose MRSA infection nor to guide or monitor treatment for MRSA infections. Performed at [facility], [place] [address], [place], [place] [address]       Radiology Studies: Dg Chest Port 1 View  Result Date: 07/02/2017 CLINICAL DATA:  Follow-up right pleural effusion. EXAM: PORTABLE CHEST 1 VIEW COMPARISON:  Portable chest x-ray of July 01, 2017. FINDINGS: The left lung is well-expanded. There is mild shift of mediastinum toward the right which is stable. There is opacification of nearly all of the right hemithorax but a small amount of aerated right upper lung is present today. The right pleural drainage catheter tip projects over the medial aspect of the fourth and fifth posterior rib interspace. The left heart border is normal. The central pulmonary vascularity is mildly prominent. There is no left pleural effusion. IMPRESSION: Slight interval improvement in the appearance of the right hemithorax with a small amount of aeration of the right upper lobe. Stable appearance of the right  pleural drainage catheter. Electronically Signed   By: David  Martinique M.D.   On: 07/02/2017 08:24   Dg Chest Port 1 View  Result Date: 07/01/2017 CLINICAL DATA:  Shortness of breath. Status post pleural drainage catheter 06/29/2017. History of lung cancer. EXAM: PORTABLE CHEST 1 VIEW COMPARISON:  Single-view of the chest 06/30/2017 and 06/27/2017. CT chest 06/27/2017. FINDINGS: Pleural drainage catheter is again seen. There is complete whiteout of the right chest consistent with the presence of a large effusion. 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 Cardiac silhouette is largely obscured. No pneumothorax. IMPRESSION: No change in complete whiteout of the right chest consistent with pleural effusion and airspace disease. Pleural drainage catheter is in place. No new abnormality. Electronically Signed   By: Inge Rise M.D.   On: 07/01/2017 10:58      Scheduled Meds: . albuterol  2.5 mg Nebulization TID  . mouth rinse  15 mL Mouth Rinse BID  . metroNIDAZOLE  500 mg Oral Q8H  . sertraline  50 mg Oral Daily   Continuous Infusions: . heparin 1,900 Units/hr (07/02/17 2305)  . phenylephrine Stopped (07/01/17 0410)     LOS: 7 days    Time spent: Total of 25 minutes spent with pt, greater than 50% of which was spent in discussion of  treatment, counseling and coordination of care   Chipper Oman, MD Pager: Text Page via [email or web address]   If 7PM-7AM, please contact night-coverage [email or web address] 07/03/2017, 8:30 AM   Note - This record has been created using [employer]. Chart creation errors have been sought, but may not always have been located. Such creation errors do not reflect on the standard of medical care.

## 2017-07-03 NOTE — Progress Notes (Addendum)
PULMONARY / CRITICAL CARE MEDICINE   Name: Cassidy Sanders MRN: [id number] DOB: [birth year]    ADMISSION DATE:  06/25/2017 CONSULTATION DATE:  06/29/2017  REFERRING MD:  Lonny Prude - [facility]  CHIEF COMPLAINT: Right pleural effusion.  HISTORY OF PRESENT ILLNESS:   77 year old woman with a history of non-small cell lung cancer.  She was admitted with protracted diarrhea and was found to have a C. difficile colitis.  Diarrhea has now settled on treatment.  She also complained of increasing shortness of breath and is known to have a right pleural effusion previously seen by Dr. Lake Bells and which had previously been tapped 3/1 >> atypical cells.  On presentation this time however the effusion now filled her right hemithorax completely.  Imaging  CT scan performed on 4-17 shows Recurrent large right pleural effusion with complete right lung collapse/consolidation now obscuring the known right hilar/mediastinal lung mass. CT was reviewed by Dr. Nelda Marseille.  Significant  events  4/19 pleurx catheter placement by IR >> 1.5 L drained 4/21 750 cc's drained   SUBJECTIVE:  No events overnight, no new complaints, OOB with good BP . States her breathing is better  VITAL SIGNS: BP (!) 150/82   Pulse (!) 103   Temp 98.2 F (36.8 C) (Oral)   Resp (!) 9   Ht 5\' 9"  (1.753 m)   Wt 179 lb 7.3 oz (81.4 kg)   SpO2 93%   BMI 26.50 kg/m   INTAKE / OUTPUT: I/O last 3 completed shifts: In: 954.7 [P.O.:120; I.V.:834.7] Out: 1001 [Urine:1000; Stool:1]  PHYSICAL EXAMINATION: General: Thin frail woman, on BSC, wearing 6 L [place] with sats of 94 Neuro: Awake alert and oriented HEENT: NCAT, MM Pink and dry,  No LAD Cardiovascular: S1, S2, RRR, tachy per tele, No RMG,   No edema. No JVD Lungs: decreased on right, dull to percuss, few rhonchi Abdomen: Abdomen soft, non-tender, non-distended Musculoskeletal: no obvious deformities, adequate strength Skin: Skin is intact, warm and  dry  LABS:  BMET Recent Labs  Lab 07/01/17 0348 07/02/17 0356 07/03/17 0518  NA 138 138 140  K 3.5 3.2* 4.0  CL 105 104 109  CO2 26 24 24   BUN 8 7 5*  CREATININE 0.47 0.35* 0.42*  GLUCOSE 102* 113* 100*   Electrolytes Recent Labs  Lab 07/01/17 0348 07/02/17 0356 07/03/17 0518  CALCIUM 8.0* 8.0* 8.2*  MG 1.6* 1.9 1.9  PHOS  --   --  2.8   CBC Recent Labs  Lab 07/01/17 0348 07/02/17 0356 07/03/17 0518  WBC 7.5 7.9 6.4  HGB 8.6* 8.7* 8.8*  HCT 27.7* 27.7* 29.3*  PLT 225 224 239   Coag's Recent Labs  Lab 06/28/17 0605  APTT 34  INR 1.21   Sepsis Markers No results for input(s): LATICACIDVEN, PROCALCITON, O2SATVEN in the last 168 hours.  ABG No results for input(s): PHART, PCO2ART, PO2ART in the last 168 hours.  Liver Enzymes Recent Labs  Lab 06/26/17 1322  AST 27  ALT 18  ALKPHOS 81  BILITOT 0.7  ALBUMIN 2.7*   Cardiac Enzymes No results for input(s): TROPONINI, PROBNP in the last 168 hours.  Glucose No results for input(s): GLUCAP in the last 168 hours.  I reviewed CXR myself, pleural effusion noted with pleurex catheter in place  DISCUSSION: This 77 year old lady has recurrent large pleural effusion undoubtedly related to her non-small cell lung cancer. Atypical cells on previous  fluid cytology.  S/p pleurX.  Discussed with PCCM-NP.  ASSESSMENT / PLAN:  Right  pleural effusion>> CXR with complete whiteout R hemithorax  - Drain on a PRN bases at this point>> slow drain for patient comfort  - Trend CXR  To evaluate fluid volume/ need for drainage  - Drain 4/23 500 cc  Hypokalemia>> resolved  - K-dur 40 meq PO x2 doses 4/22  - Trend BMET daily             - Replete electrolytes prn  Hypomag  - 2gm IV x1 on 4/22  - Remains low 4.23  , will bolus another 2  Grams and recheck in am 4/24  AF-RVR   - Now in NSR, Amio d/c'd 4/22  - Defer to cards  Hypotension - resolved   C diff colitis - oral flagyl   Pt. Has improved. She is  hemodynamically stable.  PCCM will sign off . Please don't hesitate to call if further assistance is needed.  Magdalen Spatz, AGACNP-[facility] [place]. Pager: [phone].  07/03/2017, 9:23 AM  Attending Note:  77 year old female with recurrent pleural effusion likely related to non-small cell lung cancer with fluid analysis showing atypical cells on cytology s/p placement of pleurex catheter.  On exam, decreased BS on the side of the pleurex.  I reviewed CXR myself, pleurex is in good position.  Discussed with PCCM-NP.  Pleural effusion:  - Recommend proceeding with a slow drain for patient comfort  - CXR only if there is a clinical status at this point  Hypokalemia: resolved  - K-dur 40 meq PO x2 doses given 4/22, check in AM  - Replete electrolytes as needed  Hypomag:  - Replace and recheck  Bronchospasm  - BD as ordered  AF with RVR: NSR now  - D/C amio  Hypotension: resolved with decreasing BB  - Minimize as able  C diff colitis: continue oral flagyl for a total of 21 days  Patient has dramatically improved at this point, plan as above.  PCCM will sign off, please call back if needed.  Patient seen and examined, agree with above note.  I dictated the care and orders written for this patient under my direction.  Rush Farmer, [place]

## 2017-07-03 NOTE — Telephone Encounter (Signed)
Dtr requests Cassidy Sanders see pt -'My mom needs a PEP talk" she is in poor spirits stating "I don;t think I can do this again".

## 2017-07-03 NOTE — Telephone Encounter (Signed)
R/s apt per MM - patients daughter is aware of appt date and time.

## 2017-07-03 NOTE — Telephone Encounter (Signed)
I went to the MICU and updated Ms. Cassidy Sanders about her condition.  She looks much better today.  We would likely drop the chemotherapy starting from the next cycle and treat her only with single agent Keytruda.  Thank you

## 2017-07-03 NOTE — Progress Notes (Signed)
Progress Note  Patient Name: Cassidy Sanders Date of Encounter: 07/03/2017  Primary Cardiologist: Jenkins Rouge, MD   Subjective   No chest pain, very bloody urine since the heparin started. For PleurX drain today  Inpatient Medications    Scheduled Meds: . albuterol  2.5 mg Nebulization TID  . mouth rinse  15 mL Mouth Rinse BID  . metroNIDAZOLE  500 mg Oral Q8H  . sertraline  50 mg Oral Daily   Continuous Infusions: . heparin 1,900 Units/hr (07/02/17 2305)  . phenylephrine Stopped (07/01/17 0410)   PRN Meds: acetaminophen **OR** acetaminophen, albuterol, diphenhydrAMINE, oxyCODONE-acetaminophen   Vital Signs    Vitals:   07/03/17 0318 07/03/17 0326 07/03/17 0400 07/03/17 0500  BP:   (!) 150/82   Pulse:   (!) 103   Resp:   (!) 9   Temp: 98.6 F (37 C)     TempSrc: Oral     SpO2:  93% 95%   Weight:    179 lb 7.3 oz (81.4 kg)  Height:        Intake/Output Summary (Last 24 hours) at 07/03/2017 0747 Last data filed at 07/03/2017 0602 Gross per 24 hour  Intake 954.66 ml  Output 1000 ml  Net -45.34 ml   Filed Weights   06/30/17 1327 07/01/17 0413 07/03/17 0500  Weight: 173 lb 11.6 oz (78.8 kg) 176 lb 9.4 oz (80.1 kg) 179 lb 7.3 oz (81.4 kg)    Telemetry    NSR w/ PVCs and occ pairs, HR 80s-100s  07/03/2017 - Personally Reviewed  ECG    04/20 Afib nonspecific ST changes  - Personally Reviewed  Physical Exam  Alopecia GEN: No acute distress.   Neck: No JVD Cardiac: RRR, no murmurs, rubs, or gallops.  Respiratory: Pleur X catheter right lung limited BS, decreased BS R, rales L GI: Soft, nontender, non-distended  MS: No edema; No deformity. Has infected IV site L forearm Neuro:  Nonfocal  Psych: Normal affect   Labs    Chemistry Recent Labs  Lab 06/26/17 1322  07/01/17 0348 07/02/17 0356 07/03/17 0518  NA 138   < > 138 138 140  K 3.6   < > 3.5 3.2* 4.0  CL 104   < > 105 104 109  CO2 25   < > 26 24 24   GLUCOSE 101*   < > 102* 113* 100*  BUN  13   < > 8 7 5*  CREATININE 0.57   < > 0.47 0.35* 0.42*  CALCIUM 8.1*   < > 8.0* 8.0* 8.2*  PROT 6.3*  --   --   --   --   ALBUMIN 2.7*  --   --   --   --   AST 27  --   --   --   --   ALT 18  --   --   --   --   ALKPHOS 81  --   --   --   --   BILITOT 0.7  --   --   --   --   GFRNONAA >60   < > >60 >60 >60  GFRAA >60   < > >60 >60 >60  ANIONGAP 9   < > 7 10 7    < > = values in this interval not displayed.     Hematology Recent Labs  Lab 07/01/17 0348 07/02/17 0356 07/03/17 0518  WBC 7.5 7.9 6.4  RBC 3.23* 3.24* 3.42*  HGB 8.6* 8.7* 8.8*  HCT 27.7* 27.7* 29.3*  MCV 85.8 85.5 85.7  MCH 26.6 26.9 25.7*  MCHC 31.0 31.4 30.0  RDW 18.5* 18.5* 18.8*  PLT 225 224 239     Radiology    Dg Chest Port 1 View  Result Date: 07/02/2017 CLINICAL DATA:  Follow-up right pleural effusion. EXAM: PORTABLE CHEST 1 VIEW COMPARISON:  Portable chest x-ray of July 01, 2017. FINDINGS: The left lung is well-expanded. There is mild shift of mediastinum toward the right which is stable. There is opacification of nearly all of the right hemithorax but a small amount of aerated right upper lung is present today. The right pleural drainage catheter tip projects over the medial aspect of the fourth and fifth posterior rib interspace. The left heart border is normal. The central pulmonary vascularity is mildly prominent. There is no left pleural effusion. IMPRESSION: Slight interval improvement in the appearance of the right hemithorax with a small amount of aeration of the right upper lobe. Stable appearance of the right pleural drainage catheter. Electronically Signed   By: David  Martinique M.D.   On: 07/02/2017 08:24   Dg Chest Port 1 View  Result Date: 07/01/2017 CLINICAL DATA:  Shortness of breath. Status post pleural drainage catheter 06/29/2017. History of lung cancer. EXAM: PORTABLE CHEST 1 VIEW COMPARISON:  Single-view of the chest 06/30/2017 and 06/27/2017. CT chest 06/27/2017. FINDINGS: Pleural  drainage catheter is again seen. There is complete whiteout of the right chest consistent with the presence of a large effusion. 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 Cardiac silhouette is largely obscured. No pneumothorax. IMPRESSION: No change in complete whiteout of the right chest consistent with pleural effusion and airspace disease. Pleural drainage catheter is in place. No new abnormality. Electronically Signed   By: Inge Rise M.D.   On: 07/01/2017 10:58    Cardiac Studies   Echo 07/01/2017 - Left ventricle: The cavity size was normal. Wall thickness was   increased in a pattern of moderate LVH. Systolic function was   normal. The estimated ejection fraction was in the range of 55%   to 60%. Features are consistent with a pseudonormal left   ventricular filling pattern, with concomitant abnormal relaxation   and increased filling pressure (grade 2 diastolic dysfunction).   Doppler parameters are consistent with high ventricular filling   pressure. - Aortic valve: Mildly to moderately calcified annulus. Trileaflet;   moderately thickened leaflets. Valve area (VTI): 2.29 cm^2. Valve   area (Vmax): 2.01 cm^2. Valve area (Vmean): 2.06 cm^2. - Mitral valve: Mildly calcified annulus. Mildly thickened leaflets - Left atrium: The atrium was severely dilated. - Right ventricle: The cavity size was mildly dilated. Wall   thickness was normal. - Right atrium: The atrium was mildly dilated. - Pulmonary arteries: Systolic pressure was moderately increased.   PA peak pressure: 45 mm Hg (S). - Technically difficult study.   Patient Profile     77 y.o. 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      Signed, Rosaria Ferries, PA-C  07/03/2017, 7:47 AM

## 2017-07-03 NOTE — Progress Notes (Signed)
ANTICOAGULATION CONSULT NOTE - Follow Up Consult  Pharmacy Consult for Heparin Indication: atrial fibrillation  Patient Measurements: Height: 5\' 9"  (175.3 cm) Weight: 179 lb 7.3 oz (81.4 kg) IBW/kg (Calculated) : 66.2 Heparin Dosing Weight: 78.8 kg  Labs: Recent Labs    07/01/17 0348  07/02/17 0356 07/02/17 0802 07/02/17 2010 07/03/17 0518  HGB 8.6*  --  8.7*  --   --  8.8*  HCT 27.7*  --  27.7*  --   --  29.3*  PLT 225  --  224  --   --  239  HEPARINUNFRC  --    < >  --  0.31 0.31 0.47  CREATININE 0.47  --  0.35*  --   --  0.42*   < > = values in this interval not displayed.   Estimated Creatinine Clearance: 66.1 mL/min (A) (by C-G formula based on SCr of 0.42 mg/dL (L)).  Medications:  Infusions:  . heparin 1,900 Units/hr (07/02/17 2305)  . magnesium sulfate 1 - 4 g bolus IVPB    . phenylephrine Stopped (07/01/17 0410)   Assessment: 50 yoF with HTN, stage IV NSCLC with malignant R effusion, admitted for C diff colitis.  Pharmacy is consulted to dose Heparin for new onset Afib.  4/22: AM Heparin level 0.31, Heparin increased to 1800 units/hr.          PM Heparin level resulted again at 0.31 units/hr, Heparin infusion increased to 1900 units/hr  Today, 07/03/2017  Hep level 0.47 units/ml at 1900 units/hr, in desired range  CBC low, stable, Plt wnl  No issues with infusion, no s/s bleed  Goal of Therapy:  Heparin level 0.3-0.7 units/ml Monitor platelets by anticoagulation protocol: Yes   Plan:  Continue heparin IV infusion at 1900 units/hr Daily heparin level and CBC  Minda Ditto PharmD Pager [phone] 07/03/2017, 10:04 AM

## 2017-07-03 NOTE — Telephone Encounter (Signed)
Pt daughter called and cancelled appt due to pt being in the hospital - appts cancelled - r/s logged due to capped days next week - will call pt when appts are scheduled.

## 2017-07-04 ENCOUNTER — Inpatient Hospital Stay (HOSPITAL_COMMUNITY): Payer: Medicare Other

## 2017-07-04 ENCOUNTER — Inpatient Hospital Stay: Payer: Medicare Other | Admitting: Internal Medicine

## 2017-07-04 ENCOUNTER — Inpatient Hospital Stay: Payer: Medicare Other

## 2017-07-04 DIAGNOSIS — D702 Other drug-induced agranulocytosis: Secondary | ICD-10-CM

## 2017-07-04 DIAGNOSIS — R197 Diarrhea, unspecified: Secondary | ICD-10-CM

## 2017-07-04 DIAGNOSIS — E860 Dehydration: Secondary | ICD-10-CM

## 2017-07-04 DIAGNOSIS — E440 Moderate protein-calorie malnutrition: Secondary | ICD-10-CM

## 2017-07-04 LAB — HEPARIN LEVEL (UNFRACTIONATED): Heparin Unfractionated: <0.1 IU/mL — ABNORMAL LOW (ref 0.30–0.70)

## 2017-07-04 LAB — BASIC METABOLIC PANEL
Anion gap: 9 (ref 5–15)
BUN: <5 mg/dL — ABNORMAL LOW (ref 6–20)
CALCIUM: 8.2 mg/dL — AB (ref 8.9–10.3)
CHLORIDE: 105 mmol/L (ref 101–111)
CO2: 28 mmol/L (ref 22–32)
Creatinine, Ser: 0.42 mg/dL — ABNORMAL LOW (ref 0.44–1.00)
GFR calc non Af Amer: >60 mL/min (ref >60)
Glucose, Bld: 102 mg/dL — ABNORMAL HIGH (ref 65–99)
Potassium: 3.6 mmol/L (ref 3.5–5.1)
SODIUM: 142 mmol/L (ref 135–145)

## 2017-07-04 LAB — CBC
HCT: 28.8 % — ABNORMAL LOW (ref 36.0–46.0)
HEMOGLOBIN: 8.7 g/dL — AB (ref 12.0–15.0)
MCH: 26 pg (ref 26.0–34.0)
MCHC: 30.2 g/dL (ref 30.0–36.0)
MCV: 86 fL (ref 78.0–100.0)
Platelets: 231 10*3/uL (ref 150–400)
RBC: 3.35 MIL/uL — AB (ref 3.87–5.11)
RDW: 19.1 % — ABNORMAL HIGH (ref 11.5–15.5)
WBC: 6.1 10*3/uL (ref 4.0–10.5)

## 2017-07-04 LAB — MAGNESIUM: Magnesium: 1.8 mg/dL (ref 1.7–2.4)

## 2017-07-04 MED ORDER — LEVALBUTEROL HCL 0.63 MG/3ML IN NEBU: 0.6300 mg | INHALATION_SOLUTION | Freq: Four times a day (QID) | RESPIRATORY_TRACT | Status: DC | PRN

## 2017-07-04 MED ORDER — METOPROLOL TARTRATE 25 MG PO TABS
25.0000 mg | ORAL_TABLET | Freq: Once | ORAL | Status: AC
  Administered 2017-07-04: 25 mg via ORAL
  Filled 2017-07-04: qty 1

## 2017-07-04 MED ORDER — ENSURE ENLIVE PO LIQD: 237.0000 mL | Freq: Two times a day (BID) | ORAL | Status: DC | PRN

## 2017-07-04 MED ORDER — METOPROLOL TARTRATE 50 MG PO TABS
50.0000 mg | ORAL_TABLET | Freq: Two times a day (BID) | ORAL | Status: DC
  Administered 2017-07-04 – 2017-07-06 (×4): 50 mg via ORAL
  Filled 2017-07-04 (×4): qty 1

## 2017-07-04 NOTE — Progress Notes (Signed)
Initial Nutrition Assessment  DOCUMENTATION CODES:   Non-severe (moderate) malnutrition in context of chronic illness, Non-severe (moderate) malnutrition in context of acute illness/injury  INTERVENTION:  - Will order Ensure Enlive BID PRN, each supplement provides 350 kcal and 20 grams of protein. - Continue to encourage PO intakes.  - RD will continue to monitor for additional nutrition-related needs.   NUTRITION DIAGNOSIS:   Moderate Malnutrition related to acute illness, chronic illness, catabolic illness, cancer and cancer related treatments as evidenced by mild fat depletion, mild muscle depletion.  GOAL:   Patient will meet greater than or equal to 90% of their needs  MONITOR:   PO intake, Supplement acceptance, Weight trends, Labs, I & O's  REASON FOR ASSESSMENT:   Malnutrition Screening Tool  ASSESSMENT:   77 y.o. female with a history of metastatic non-small cell carcinoma, hypertension. She presented with diarrhea and found to be dehydrated. Diarrhea has improved with initiation of metronidazole. S/p Pleurex, hospital course complicated with SOB, hypotension and Afib. Got neo for short period of time, now BP improved. Cardiology and PCCM on board.   BMI indicates overweight status, appropriate for age. Per chart review, pt consumed 75% of breakfast on 4/20, 50% of breakfast on 4/21, 30% of breakfast and 100% of lunch yesterday. Visualized lunch tray today; pt consumed 75% of quesadilla and side salad was untouched. Pt reports that she has taste changes (bland taste); she is able to taste salt and sugar better than other items but is not able to taste even spicy items. She reports decreased appetite d/t this but has been trying to eat well.   PTA she was drinking Ensure and Boost frequently. No ONS ordered since admission. Pt would prefer order not be placed so she does not begin to heavily rely on supplements and limit intake of foods. She is amendable to PRN order for  times when she is unable to eat a meal, etc. She reports that PTA she was having issues with fluid overload which caused pressure with eating which made consuming mainly fluids easier.   Pt and daughter, who is at bedside, are very upbeat, positive, and greatly appreciative of all hospital staff.  NFPE outlined below. Per chart review, pt has lost 15 lbs (8% body weight) in the past 2 months. This is significant for time frame. Unsure of how much of this is/was fluid related.   Medications reviewed. Labs reviewed; BUN: <5 mg/dL, creatinine: 0.42 mg/dL, Ca: 8.2 mg/dL.       NUTRITION - FOCUSED PHYSICAL EXAM:    Most Recent Value  Orbital Region  Mild depletion  Upper Arm Region  Mild depletion  Thoracic and Lumbar Region  Unable to assess  Buccal Region  No depletion  Temple Region  Mild depletion  Clavicle Bone Region  Mild depletion  Clavicle and Acromion Bone Region  Mild depletion  Scapular Bone Region  No depletion  Dorsal Hand  Mild depletion  Patellar Region  Unable to assess  Anterior Thigh Region  Unable to assess  Posterior Calf Region  Unable to assess  Edema (RD Assessment)  Unable to assess  Hair  Reviewed  Eyes  Reviewed  Mouth  Reviewed  Skin  Reviewed  Nails  Reviewed       Diet Order:  Diet regular Room service appropriate? Yes; Fluid consistency: Thin  EDUCATION NEEDS:   No education needs have been identified at this time  Skin:  Skin Assessment: Reviewed RN Assessment  Last BM:  4/23  Height:   Ht Readings from Last 1 Encounters:  06/30/17 5\' 9"  (1.753 m)    Weight:   Wt Readings from Last 1 Encounters:  07/04/17 175 lb 7.8 oz (79.6 kg)    Ideal Body Weight:  65.91 kg  BMI:  Body mass index is 25.91 kg/m.  Estimated Nutritional Needs:   Kcal:  2070-2230 (26-28 kcal/kg)  Protein:  110-127 grams (1.4-1.6 grams/kg)  Fluid:  >/= 2.2 L/day     Jarome Matin, MS, RD, LDN, [facility] Inpatient Clinical Dietitian Pager #  [phone] After hours/weekend pager # [phone]

## 2017-07-04 NOTE — Progress Notes (Signed)
ANTICOAGULATION CONSULT NOTE - Follow Up Consult  Pharmacy Consult for Heparin Indication: atrial fibrillation  Allergies  Allergen Reactions  . Latex Dermatitis  . Gabapentin Other (See Comments)    Excessive sleep   . Tape Rash    Patient Measurements: Height: 5\' 9"  (175.3 cm) Weight: 175 lb 7.8 oz (79.6 kg) IBW/kg (Calculated) : 66.2 Heparin Dosing Weight: TBW  Vital Signs: Temp: 98.6 F (37 C) (04/24 0739) Temp Source: Oral (04/24 0739) BP: 176/108 (04/24 0624) Pulse Rate: 109 (04/24 0624)  Labs: Recent Labs    07/02/17 0356  07/02/17 2010 07/03/17 0518 07/04/17 0313  HGB 8.7*  --   --  8.8* 8.7*  HCT 27.7*  --   --  29.3* 28.8*  PLT 224  --   --  239 231  HEPARINUNFRC  --    < > 0.31 0.47 <0.10*  CREATININE 0.35*  --   --  0.42* 0.42*   < > = values in this interval not displayed.    Estimated Creatinine Clearance: 65.5 mL/min (A) (by C-G formula based on SCr of 0.42 mg/dL (L)).   Medications:  Infusions:  . heparin Stopped (07/03/17 0935)  . phenylephrine Stopped (07/01/17 0410)    Assessment: 6 yoF with HTN, stage IV NSCLC with malignant R effusion, admitted for C diff colitis.  Pharmacy is consulted to dose Heparin for new onset Afib.  Significant events: 4/23 Hematuria noted.  RN was given verbal orders.  Heparin gtt stopped at 0935, but orders were not d/c.  Today, 07/04/2017: Heparin level < 0.1, heparin gtt off CBC: Hgb remains low/stable at 8.7, Plt 231 RN reports no bleeding and no hematuria today. SCr stable at 0.42  Goal of Therapy:  Heparin level 0.3-0.7 units/ml Monitor platelets by anticoagulation protocol: Yes   Plan:  Heparin remains on hold. Pharmacy to sign off heparin consult as infusion remains off at this time.  Please re-consult pharmacy for anticoagulation if/when needed.   Gretta Arab PharmD, BCPS Pager [phone] 07/04/2017 8:21 AM

## 2017-07-04 NOTE — Progress Notes (Signed)
PROGRESS NOTE    Cassidy Sanders  FOY:[id number] DOB: 1940-09-15 DOA: 06/25/2017 PCP: Cassidy Panda, MD    Brief Narrative:   Cassidy Sanders a 77 y.o.female with a history of metastatic non-small cell carcinoma, hypertension. She presented with diarrhea and found to be dehydrated. Diarrhea has improved with initiation of metronidazole. S/p Pleurex, hospital course complicated with SOB, hypotension and Afib.  Currently she is in NSR, and her breathing has improved.    Assessment & Plan:   Principal Problem:   Diarrhea Active Problems:   Essential hypertension   Physical deconditioning   Malignant neoplasm of posterior mediastinum (HCC)   Atrial fibrillation with RVR (HCC)   Hypotension   Hypomagnesemia   Hypokalemia   Pleural effusion   Pleural effusion on right   Bronchospasm   Malnutrition of moderate degree   Diarrhea: resolved.  Complete the flagyl to complete the course.    afib with RVR: Converted to NSR.  Cardiology consulted and adjusting medications as appropriate.  Not on anticoagulation at this time as she is NSR.    Large  Recurrent right sided effusion:  - s/p pleurex catheter  - drain slowly as needed.    Metastatic non small cell ca: Appreciate oncology recommendations.  Patient currently under the care of Dr. Julien Nordmann and Dr. Lisbeth Renshaw. Patient has been treated with systemic chemotherapy in addition to palliative radiotherapy. Last cycle of chemotherapy (Keytruda, Taxol and Paraplatin) was 06/12/17  Acute on chronic respiratory failure with hypoxia:  Probably worsening of the disease and recurrent effusion.    Hypokalemia nd hypomagnesemia: Resolved.    Anemia of chronic disease: Hemoglobin stable.    DVT prophylaxis: scd's Code Status: full code.  Family Communication: none at bedside.  Disposition Plan: pending resolution of respiratory failure.    Consultants:   PCCM.    Procedures: none.    Antimicrobials:  flagyl   Subjective: Reports feeling much better today.   Objective: Vitals:   07/04/17 0900 07/04/17 1000 07/04/17 1100 07/04/17 1200  BP: (!) 189/103 (!) 156/63 136/74 (!) (P) 173/104  Pulse: (!) 104 90 89 95  Resp: (!) 33 (!) 24 (!) 25 (!) 21  Temp:    98.3 F (36.8 C)  TempSrc:    Oral  SpO2: 91% 100% 96% 93%  Weight:      Height:        Intake/Output Summary (Last 24 hours) at 07/04/2017 1427 Last data filed at 07/03/2017 2300 Gross per 24 hour  Intake 240 ml  Output -  Net 240 ml   Filed Weights   07/01/17 0413 07/03/17 0500 07/04/17 0500  Weight: 80.1 kg (176 lb 9.4 oz) 81.4 kg (179 lb 7.3 oz) 79.6 kg (175 lb 7.8 oz)    Examination:  General exam: Appears calm and comfortable on 4 lit of Rea oxygen.  Respiratory system: coarse breath sounds. Scattered wheezing.  Cardiovascular system: S1 & S2 heard, RRR. No JVD,  No pedal edema. Gastrointestinal system: Abdomen is nondistended, soft and nontender. No organomegaly or masses felt. Normal bowel sounds heard. Central nervous system: Alert and oriented. Non focal.  Extremities: Symmetric 5 x 5 power. Skin: No rashes, lesions or ulcers Psychiatry: . Mood & affect appropriate.     Data Reviewed: I have personally reviewed following labs and imaging studies  CBC: Recent Labs  Lab 06/28/17 0605 07/01/17 0348 07/02/17 0356 07/03/17 0518 07/04/17 0313  WBC 5.7 7.5 7.9 6.4 6.1  HGB 9.4* 8.6* 8.7* 8.8* 8.7*  HCT 30.2* 27.7* 27.7*  29.3* 28.8*  MCV 84.1 85.8 85.5 85.7 86.0  PLT 204 225 224 239 992   Basic Metabolic Panel: Recent Labs  Lab 06/30/17 0609 07/01/17 0348 07/02/17 0356 07/03/17 0518 07/04/17 0313  NA 140 138 138 140 142  K 3.2* 3.5 3.2* 4.0 3.6  CL 101 105 104 109 105  CO2 29 26 24 24 28   GLUCOSE 92 102* 113* 100* 102*  BUN 6 8 7  5* <5*  CREATININE 0.43* 0.47 0.35* 0.42* 0.42*  CALCIUM 8.5* 8.0* 8.0* 8.2* 8.2*  MG 1.7 1.6* 1.9 1.9 1.8  PHOS  --   --   --  2.8  --    GFR: Estimated  Creatinine Clearance: 65.5 mL/min (A) (by C-G formula based on SCr of 0.42 mg/dL (L)). Liver Function Tests: No results for input(s): AST, ALT, ALKPHOS, BILITOT, PROT, ALBUMIN in the last 168 hours. No results for input(s): LIPASE, AMYLASE in the last 168 hours. No results for input(s): AMMONIA in the last 168 hours. Coagulation Profile: Recent Labs  Lab 06/28/17 0605  INR 1.21   Cardiac Enzymes: No results for input(s): CKTOTAL, CKMB, CKMBINDEX, TROPONINI in the last 168 hours. BNP (last 3 results) No results for input(s): PROBNP in the last 8760 hours. HbA1C: No results for input(s): HGBA1C in the last 72 hours. CBG: No results for input(s): GLUCAP in the last 168 hours. Lipid Profile: No results for input(s): CHOL, HDL, LDLCALC, TRIG, CHOLHDL, LDLDIRECT in the last 72 hours. Thyroid Function Tests: No results for input(s): TSH, T4TOTAL, FREET4, T3FREE, THYROIDAB in the last 72 hours. Anemia Panel: No results for input(s): VITAMINB12, FOLATE, FERRITIN, TIBC, IRON, RETICCTPCT in the last 72 hours. Sepsis Labs: No results for input(s): PROCALCITON, LATICACIDVEN in the last 168 hours.  Recent Results (from the past 240 hour(s))  MRSA PCR Screening     Status: None   Collection Time: 06/30/17  7:44 PM  Result Value Ref Range Status   MRSA by PCR NEGATIVE NEGATIVE Final    Comment:        The GeneXpert MRSA Assay (FDA approved for NASAL specimens only), is one component of a comprehensive MRSA colonization surveillance program. It is not intended to diagnose MRSA infection nor to guide or monitor treatment for MRSA infections. Performed at [facility], [place] [address], [place], [place] [address]          Radiology Studies: Dg Chest Port 1 View  Result Date: 07/04/2017 CLINICAL DATA:  Respiratory failure EXAM: PORTABLE CHEST 1 VIEW COMPARISON:  Portable chest x-ray of July 03, 2017 FINDINGS: There remains near total opacification of the right  hemithorax. The right chest tube overlies over the posterior third and fourth rib interspace medially and appears to been advanced slightly. There is mild shift of the mediastinum toward the right. The left lung is well-expanded. The interstitial markings are slightly more conspicuous on the left today. The left heart border is less well defined. The pulmonary vascularity is indistinct. There is calcification in the wall of the aortic arch. IMPRESSION: Slight interval increase in the interstitial markings in the left lung when compared to yesterday's study. This may reflect slightly increased pulmonary edema. Stable open near-total opacification of the right hemithorax. Electronically Signed   By: David  Martinique M.D.   On: 07/04/2017 10:33   Dg Chest Port 1 View  Result Date: 07/03/2017 CLINICAL DATA:  Pleural effusion, shortness of Breath EXAM: PORTABLE CHEST 1 VIEW COMPARISON:  07/02/2017 FINDINGS: Left PleurX catheter noted in place.  Complete opacification of the right hemithorax, stable. Mild perihilar and lower lobe opacities on the left, stable. Heart is normal size. No acute bony abnormality. IMPRESSION: Stable complete whiteout of the right hemithorax with PleurX catheter in place. Stable left perihilar and lower lobe airspace opacities. Electronically Signed   By: Rolm Baptise M.D.   On: 07/03/2017 09:02        Scheduled Meds: . mouth rinse  15 mL Mouth Rinse BID  . metoprolol tartrate  50 mg Oral BID  . metroNIDAZOLE  500 mg Oral Q8H  . sertraline  50 mg Oral Daily   Continuous Infusions: . phenylephrine Stopped (07/01/17 0410)     LOS: 8 days    Time spent: 35 minutes.     Hosie Poisson, MD [facility] Pager [phone]  If 7PM-7AM, please contact night-coverage [email or web address] Password [facility] 07/04/2017, 2:27 PM

## 2017-07-04 NOTE — Progress Notes (Signed)
Physical Therapy Treatment Patient Details Name: Navina Wohlers MRN: [id number] DOB: Apr 26, 1940 Today's Date: 07/04/2017    History of Present Illness Tyria Springer is a 76 y.o. female with a history of metastatic non-small cell carcinoma, hypertension. She presented with diarrhea and found to be dehydrated.    PT Comments    Patient reports  That she has been I recliner most of day. Now with need for [facility] Assisted there and back, Saturation 4 liters=90%.  Follow Up Recommendations  Home health PT     Equipment Recommendations  None recommended by PT    Recommendations for Other Services       Precautions / Restrictions Precautions Precautions: Fall Precaution Comments: frequent stools (?c-diff), on O2    Mobility  Bed Mobility Overal bed mobility: Modified Independent                Transfers Overall transfer level: Needs assistance   Transfers: Sit to/from Stand;Stand Pivot Transfers Sit to Stand: Min guard Stand pivot transfers: Min guard       General transfer comment: for safety due to weakness, to [facility] and abck to bed  Ambulation/Gait             General Gait Details: unable today   Stairs             Wheelchair Mobility    Modified Rankin (Stroke Patients Only)       Balance                                            Cognition Arousal/Alertness: Awake/alert                                            Exercises      General Comments        Pertinent Vitals/Pain Pain Assessment: No/denies pain    Home Living                      Prior Function            PT Goals (current goals can now be found in the care plan section) Progress towards PT goals: Progressing toward goals    Frequency    Min 3X/week      PT Plan Current plan remains appropriate    Co-evaluation              AM-PAC PT "6 Clicks" Daily Activity  Outcome Measure  Difficulty turning  over in bed (including adjusting bedclothes, sheets and blankets)?: None Difficulty moving from lying on back to sitting on the side of the bed? : A Little Difficulty sitting down on and standing up from a chair with arms (e.g., wheelchair, bedside commode, etc,.)?: A Little Help needed moving to and from a bed to chair (including a wheelchair)?: A Little Help needed walking in hospital room?: A Lot Help needed climbing 3-5 steps with a railing? : A Lot 6 Click Score: 17    End of Session Equipment Utilized During Treatment: Oxygen Activity Tolerance: Patient limited by fatigue;Treatment limited secondary to medical complications (Comment) Patient left: in bed;with call bell/phone within reach Nurse Communication: Mobility status PT Visit Diagnosis: Other abnormalities of gait and mobility (R26.89);Muscle weakness (generalized) (M62.81)  Time: 6546-5035 PT Time Calculation (min) (ACUTE ONLY): 19 min  Charges:  $Therapeutic Activity: 8-22 mins                    G CodesTresa Endo PT [phone]    Claretha Cooper 07/04/2017, 4:05 PM

## 2017-07-04 NOTE — Progress Notes (Signed)
Progress Note  Patient Name: Cassidy Sanders Date of Encounter: 07/04/2017  Primary Cardiologist: Jenkins Rouge, MD   Subjective   Maintaining normal sinus rhythm with no recurrence of A. fib.  Shortness of breath is improved.  She denies any chest pain.  Inpatient Medications    Scheduled Meds: . albuterol  2.5 mg Nebulization TID  . mouth rinse  15 mL Mouth Rinse BID  . metoprolol tartrate  25 mg Oral BID  . metroNIDAZOLE  500 mg Oral Q8H  . sertraline  50 mg Oral Daily   Continuous Infusions: . phenylephrine Stopped (07/01/17 0410)   PRN Meds: acetaminophen **OR** acetaminophen, albuterol, diphenhydrAMINE, oxyCODONE-acetaminophen   Vital Signs    Vitals:   07/04/17 0739 07/04/17 0800 07/04/17 0900 07/04/17 1000  BP:   (!) 189/103 (!) 156/63  Pulse:  94 (!) 104 90  Resp:  20 (!) 33 (!) 24  Temp: 98.6 F (37 C)     TempSrc: Oral     SpO2:  97% 91% 100%  Weight:      Height:        Intake/Output Summary (Last 24 hours) at 07/04/2017 1118 Last data filed at 07/03/2017 2300 Gross per 24 hour  Intake 480 ml  Output -  Net 480 ml   Filed Weights   07/01/17 0413 07/03/17 0500 07/04/17 0500  Weight: 176 lb 9.4 oz (80.1 kg) 179 lb 7.3 oz (81.4 kg) 175 lb 7.8 oz (79.6 kg)    Telemetry    NSR - Personally Reviewed  ECG    No new EKG to review - Personally Reviewed  Physical Exam   GEN: No acute distress.   Neck: No JVD Cardiac: RRR, no murmurs, rubs, or gallops.  Respiratory: Clear to auscultation bilaterally. GI: Soft, nontender, non-distended  MS: No edema; No deformity. Neuro:  Nonfocal  Psych: Normal affect   Labs    Chemistry Recent Labs  Lab 07/02/17 0356 07/03/17 0518 07/04/17 0313  NA 138 140 142  K 3.2* 4.0 3.6  CL 104 109 105  CO2 24 24 28   GLUCOSE 113* 100* 102*  BUN 7 5* <5*  CREATININE 0.35* 0.42* 0.42*  CALCIUM 8.0* 8.2* 8.2*  GFRNONAA >60 >60 >60  GFRAA >60 >60 >60  ANIONGAP 10 7 9      Hematology Recent Labs  Lab  07/02/17 0356 07/03/17 0518 07/04/17 0313  WBC 7.9 6.4 6.1  RBC 3.24* 3.42* 3.35*  HGB 8.7* 8.8* 8.7*  HCT 27.7* 29.3* 28.8*  MCV 85.5 85.7 86.0  MCH 26.9 25.7* 26.0  MCHC 31.4 30.0 30.2  RDW 18.5* 18.8* 19.1*  PLT 224 239 231    Cardiac EnzymesNo results for input(s): TROPONINI in the last 168 hours. No results for input(s): TROPIPOC in the last 168 hours.   BNPNo results for input(s): BNP, PROBNP in the last 168 hours.   DDimer No results for input(s): DDIMER in the last 168 hours.   Radiology    Dg Chest Port 1 View  Result Date: 07/04/2017 CLINICAL DATA:  Respiratory failure EXAM: PORTABLE CHEST 1 VIEW COMPARISON:  Portable chest x-ray of July 03, 2017 FINDINGS: There remains near total opacification of the right hemithorax. The right chest tube overlies over the posterior third and fourth rib interspace medially and appears to been advanced slightly. There is mild shift of the mediastinum toward the right. The left lung is well-expanded. The interstitial markings are slightly more conspicuous on the left today. The left heart border is less well  defined. The pulmonary vascularity is indistinct. There is calcification in the wall of the aortic arch. IMPRESSION: Slight interval increase in the interstitial markings in the left lung when compared to yesterday's study. This may reflect slightly increased pulmonary edema. Stable open near-total opacification of the right hemithorax. Electronically Signed   By: David  Martinique M.D.   On: 07/04/2017 10:33   Dg Chest Port 1 View  Result Date: 07/03/2017 CLINICAL DATA:  Pleural effusion, shortness of Breath EXAM: PORTABLE CHEST 1 VIEW COMPARISON:  07/02/2017 FINDINGS: Left PleurX catheter noted in place. Complete opacification of the right hemithorax, stable. Mild perihilar and lower lobe opacities on the left, stable. Heart is normal size. No acute bony abnormality. IMPRESSION: Stable complete whiteout of the right hemithorax with PleurX  catheter in place. Stable left perihilar and lower lobe airspace opacities. Electronically Signed   By: Rolm Baptise M.D.   On: 07/03/2017 09:02    Cardiac Studies   Echo 07/01/2017 - Left ventricle: The cavity size was normal. Wall thickness was increased in a pattern of moderate LVH. Systolic function was normal. The estimated ejection fraction was in the range of 55% to 60%. Features are consistent with a pseudonormal left ventricular filling pattern, with concomitant abnormal relaxation and increased filling pressure (grade 2 diastolic dysfunction). Doppler parameters are consistent with high ventricular filling pressure. - Aortic valve: Mildly to moderately calcified annulus. Trileaflet; moderately thickened leaflets. Valve area (VTI): 2.29 cm^2. Valve area (Vmax): 2.01 cm^2. Valve area (Vmean): 2.06 cm^2. - Mitral valve: Mildly calcified annulus. Mildly thickened leaflets - Left atrium: The atrium was severely dilated. - Right ventricle: The cavity size was mildly dilated. Wall thickness was normal. - Right atrium: The atrium was mildly dilated. - Pulmonary arteries: Systolic pressure was moderately increased. PA peak pressure: 45 mm Hg (S). - Technically difficult study.  Patient Profile     77 y.o. female with HTN, stage 4 non small cell lung cancer recurrent malignant right pleural effusion post Pleur X catheter, C diff colitis, seen for PAF  Assessment & Plan    PAF  -she has been maintaining normal sinus rhythm on telemetry.   -I discussed ongoing anticoagulation with CCM and they felt that the benefits outweigh the risks and starting anticoagulation with Eliquis long term but will hold off until we are sure no other invasive procedures are needed -Her CHADS2VASC score is 4.   -continue Lopressor 25 mg twice daily for suppression of atrial fibrillation -2D echocardiogram showed normal LV function EF 55-60% with grade 2 diastolic  dysfunction  Large right sided pleural effusion - Likely secondary to malignancy with cytology showing atypical cells -Pleurx catheter in place -per TRH/CCM  Hypertension -BP still remains elevated at 156/63 to 189/103 mmHg -Increase Lopressor to 30 mg twice daily  Metastatic non-small cell carcinoma -Per oncology -Recommend changing albuterol to Xopenex given her underlying A. fib on admission   For questions or updates, please contact [facility] Please consult [email or web address] for contact info under Cardiology/STEMI.      Signed, Fransico Him, MD  07/04/2017, 11:18 AM

## 2017-07-05 DIAGNOSIS — R5381 Other malaise: Secondary | ICD-10-CM

## 2017-07-05 DIAGNOSIS — C382 Malignant neoplasm of posterior mediastinum: Secondary | ICD-10-CM

## 2017-07-05 MED ORDER — APIXABAN 5 MG PO TABS
5.0000 mg | ORAL_TABLET | Freq: Two times a day (BID) | ORAL | Status: DC
  Administered 2017-07-05 – 2017-07-06 (×3): 5 mg via ORAL
  Filled 2017-07-05 (×3): qty 1

## 2017-07-05 NOTE — Progress Notes (Signed)
Progress Note  Patient Name: Cassidy Sanders Date of Encounter: 07/05/2017  Primary Cardiologist: Jenkins Rouge, MD   Subjective   Breathing ok, concerned about going home alone, but wants to. She is for thoracentesis today  Inpatient Medications    Scheduled Meds: . mouth rinse  15 mL Mouth Rinse BID  . metoprolol tartrate  50 mg Oral BID  . metroNIDAZOLE  500 mg Oral Q8H  . sertraline  50 mg Oral Daily   Continuous Infusions: . phenylephrine Stopped (07/01/17 0410)   PRN Meds: acetaminophen **OR** acetaminophen, diphenhydrAMINE, feeding supplement (ENSURE ENLIVE), levalbuterol, oxyCODONE-acetaminophen   Vital Signs    Vitals:   07/04/17 1600 07/04/17 1700 07/04/17 1800 07/05/17 0846  BP:    125/70  Pulse: 96 96 (!) 103 96  Resp:   (!) 21 18  Temp: 98.5 F (36.9 C)   (!) 97.4 F (36.3 C)  TempSrc: Oral   Oral  SpO2: 98% 94% 94% 95%  Weight:      Height:       No intake or output data in the 24 hours ending 07/05/17 0919 Filed Weights   07/01/17 0413 07/03/17 0500 07/04/17 0500  Weight: 176 lb 9.4 oz (80.1 kg) 179 lb 7.3 oz (81.4 kg) 175 lb 7.8 oz (79.6 kg)    Telemetry    SR, ST - Personally Reviewed  ECG    No new EKG to review - Personally Reviewed  Physical Exam   GEN: No acute distress.   Neck: No JVD Cardiac: RRR, soft murmur, no rubs, or gallops.  Respiratory: decreased BS R, few rales L  GI: Soft, nontender, non-distended  MS: No edema; No deformity. Neuro:  Nonfocal  Psych: Normal affect   Labs    Chemistry Recent Labs  Lab 07/02/17 0356 07/03/17 0518 07/04/17 0313  NA 138 140 142  K 3.2* 4.0 3.6  CL 104 109 105  CO2 24 24 28   GLUCOSE 113* 100* 102*  BUN 7 5* <5*  CREATININE 0.35* 0.42* 0.42*  CALCIUM 8.0* 8.2* 8.2*  GFRNONAA >60 >60 >60  GFRAA >60 >60 >60  ANIONGAP 10 7 9      Hematology Recent Labs  Lab 07/02/17 0356 07/03/17 0518 07/04/17 0313  WBC 7.9 6.4 6.1  RBC 3.24* 3.42* 3.35*  HGB 8.7* 8.8* 8.7*  HCT  27.7* 29.3* 28.8*  MCV 85.5 85.7 86.0  MCH 26.9 25.7* 26.0  MCHC 31.4 30.0 30.2  RDW 18.5* 18.8* 19.1*  PLT 224 239 231     Radiology    Dg Chest Port 1 View  Result Date: 07/04/2017 CLINICAL DATA:  Respiratory failure EXAM: PORTABLE CHEST 1 VIEW COMPARISON:  Portable chest x-ray of July 03, 2017 FINDINGS: There remains near total opacification of the right hemithorax. The right chest tube overlies over the posterior third and fourth rib interspace medially and appears to been advanced slightly. There is mild shift of the mediastinum toward the right. The left lung is well-expanded. The interstitial markings are slightly more conspicuous on the left today. The left heart border is less well defined. The pulmonary vascularity is indistinct. There is calcification in the wall of the aortic arch. IMPRESSION: Slight interval increase in the interstitial markings in the left lung when compared to yesterday's study. This may reflect slightly increased pulmonary edema. Stable open near-total opacification of the right hemithorax. Electronically Signed   By: David  Martinique M.D.   On: 07/04/2017 10:33    Cardiac Studies   Echo 07/01/2017 - Left  ventricle: The cavity size was normal. Wall thickness was increased in a pattern of moderate LVH. Systolic function was normal. The estimated ejection fraction was in the range of 55% to 60%. Features are consistent with a pseudonormal left ventricular filling pattern, with concomitant abnormal relaxation and increased filling pressure (grade 2 diastolic dysfunction). Doppler parameters are consistent with high ventricular filling pressure. - Aortic valve: Mildly to moderately calcified annulus. Trileaflet; moderately thickened leaflets. Valve area (VTI): 2.29 cm^2. Valve area (Vmax): 2.01 cm^2. Valve area (Vmean): 2.06 cm^2. - Mitral valve: Mildly calcified annulus. Mildly thickened leaflets - Left atrium: The atrium was severely  dilated. - Right ventricle: The cavity size was mildly dilated. Wall thickness was normal. - Right atrium: The atrium was mildly dilated. - Pulmonary arteries: Systolic pressure was moderately increased. PA peak pressure: 45 mm Hg (S). - Technically difficult study.  Patient Profile     77 y.o. female with HTN, stage 4 non small cell lung cancer recurrent malignant right pleural effusion post Pleur X catheter, C diff colitis, seen for PAF  Assessment & Plan    PAF  - In SR  -Her CHADS2VASC score is 4.  - after discussions w/ CCM, benefits of anticoag outweigh the risks - start Eliquis after PleurX drained today, when ok w/ CCM - continue metoprolol 25 mg bid -EF nl w/ grade 2 dd on echo  Large right sided pleural effusion -  - probably malignant - PleurX in place - to be drained prn as outpt  Hypertension - BP control improved on increased dose metoprolol  Metastatic non-small cell carcinoma - per oncology  - may benefit more from Xopenex than albuterol w/ afib.  Will arrange f/u appt.   For questions or updates, please contact [place] Please consult [email or web address] for contact info under Cardiology/STEMI.      Signed, Rosaria Ferries, PA-C  07/05/2017, 9:19 AM

## 2017-07-05 NOTE — Progress Notes (Signed)
PROGRESS NOTE    Cassidy Sanders  QMV:[id number] DOB: [birth year] DOA: 06/25/2017 PCP: [name], MD    Brief Narrative:   Cassidy Sanders a 77 y.o.female with a history of metastatic non-small cell carcinoma, hypertension. She presented with diarrhea and found to be dehydrated. Diarrhea has improved with initiation of metronidazole. S/p Pleurex, hospital course complicated with SOB, hypotension and Afib.  Currently she is in NSR, and her breathing has improved.    Assessment & Plan:   Principal Problem:   Diarrhea Active Problems:   Essential hypertension   Physical deconditioning   Malignant neoplasm of posterior mediastinum (HCC)   Atrial fibrillation with RVR (HCC)   Hypotension   Hypomagnesemia   Hypokalemia   Pleural effusion   Pleural effusion on right   Bronchospasm   Malnutrition of moderate degree   Diarrhea: resolved.  Complete the flagyl to complete the course.    Paroxysmal atrial fibrillation:  Rate controlled.  Cardiology consulted and adjusting medications as appropriate. On metoprolol 50 mg BID.  Start eliquis as per cardiology recommendations.    Large  Recurrent right sided effusion:  - s/p pleurex catheter  - drain slowly as needed.    Metastatic non small cell ca: Appreciate oncology recommendations.  Patient currently under the care of Dr. Julien Nordmann and Dr. Lisbeth Renshaw. Patient has been treated with systemic chemotherapy in addition to palliative radiotherapy. Last cycle of chemotherapy (Keytruda, Taxol and Paraplatin) was 06/12/17. Plan to start keytruda from next week.    Acute on chronic respiratory failure with hypoxia:  Probably worsening of the disease and recurrent effusion.  Currently on 4lit of [place] oxygen, increased demand from baseline of 2lit of [place] oxygen.  S/p pleurex catheter, plan for thoracentesis of 523ml    Hypokalemia nd hypomagnesemia: Resolved.    Anemia of chronic disease: Hemoglobin stable.    DVT prophylaxis:  scd's Code Status: full code.  Family Communication: none at bedside.  Disposition Plan: pending resolution of respiratory failure.    Consultants:   PCCM.   Cardiology.    Procedures: Thoracentesis. Marland Kitchen    Antimicrobials: flagyl   Subjective: Pt reports her breathing is not back to baseline.   Objective: Vitals:   07/04/17 1600 07/04/17 1700 07/04/17 1800 07/05/17 0846  BP:    125/70  Pulse: 96 96 (!) 103 96  Resp:   (!) 21 18  Temp: 98.5 F (36.9 C)   (!) 97.4 F (36.3 C)  TempSrc: Oral   Oral  SpO2: 98% 94% 94% 95%  Weight:      Height:       No intake or output data in the 24 hours ending 07/05/17 1206 Filed Weights   07/01/17 0413 07/03/17 0500 07/04/17 0500  Weight: 80.1 kg (176 lb 9.4 oz) 81.4 kg (179 lb 7.3 oz) 79.6 kg (175 lb 7.8 oz)    Examination:  General exam: Appears calm and comfortable on 4 lit of [place] oxygen.  Respiratory system: coarse breath sounds. Scattered wheezing. No rhonchi.  Cardiovascular system: S1 & S2 heard, RRR. No JVD,  Gastrointestinal system: Abdomen is soft non tender non distended bowel sounds heard.  Central nervous system: Alert and oriented. Non focal.  Extremities: trace pedal edema.  Skin: No rashes, lesions or ulcers Psychiatry: . Mood & affect appropriate.     Data Reviewed: I have personally reviewed following labs and imaging studies  CBC: Recent Labs  Lab 07/01/17 0348 07/02/17 0356 07/03/17 0518 07/04/17 0313  WBC 7.5 7.9 6.4 6.1  HGB 8.6* 8.7* 8.8* 8.7*  HCT 27.7* 27.7* 29.3* 28.8*  MCV 85.8 85.5 85.7 86.0  PLT 225 224 239 237   Basic Metabolic Panel: Recent Labs  Lab 06/30/17 0609 07/01/17 0348 07/02/17 0356 07/03/17 0518 07/04/17 0313  NA 140 138 138 140 142  K 3.2* 3.5 3.2* 4.0 3.6  CL 101 105 104 109 105  CO2 29 26 24 24 28   GLUCOSE 92 102* 113* 100* 102*  BUN 6 8 7  5* <5*  CREATININE 0.43* 0.47 0.35* 0.42* 0.42*  CALCIUM 8.5* 8.0* 8.0* 8.2* 8.2*  MG 1.7 1.6* 1.9 1.9 1.8  PHOS  --    --   --  2.8  --    GFR: Estimated Creatinine Clearance: 65.5 mL/min (A) (by C-G formula based on SCr of 0.42 mg/dL (L)). Liver Function Tests: No results for input(s): AST, ALT, ALKPHOS, BILITOT, PROT, ALBUMIN in the last 168 hours. No results for input(s): LIPASE, AMYLASE in the last 168 hours. No results for input(s): AMMONIA in the last 168 hours. Coagulation Profile: No results for input(s): INR, PROTIME in the last 168 hours. Cardiac Enzymes: No results for input(s): CKTOTAL, CKMB, CKMBINDEX, TROPONINI in the last 168 hours. BNP (last 3 results) No results for input(s): PROBNP in the last 8760 hours. HbA1C: No results for input(s): HGBA1C in the last 72 hours. CBG: No results for input(s): GLUCAP in the last 168 hours. Lipid Profile: No results for input(s): CHOL, HDL, LDLCALC, TRIG, CHOLHDL, LDLDIRECT in the last 72 hours. Thyroid Function Tests: No results for input(s): TSH, T4TOTAL, FREET4, T3FREE, THYROIDAB in the last 72 hours. Anemia Panel: No results for input(s): VITAMINB12, FOLATE, FERRITIN, TIBC, IRON, RETICCTPCT in the last 72 hours. Sepsis Labs: No results for input(s): PROCALCITON, LATICACIDVEN in the last 168 hours.  Recent Results (from the past 240 hour(s))  MRSA PCR Screening     Status: None   Collection Time: 06/30/17  7:44 PM  Result Value Ref Range Status   MRSA by PCR NEGATIVE NEGATIVE Final    Comment:        The GeneXpert MRSA Assay (FDA approved for NASAL specimens only), is one component of a comprehensive MRSA colonization surveillance program. It is not intended to diagnose MRSA infection nor to guide or monitor treatment for MRSA infections. Performed at [facility] [place] [address], [place], [place] [address]          Radiology Studies: Dg Chest Port 1 View  Result Date: 07/04/2017 CLINICAL DATA:  Respiratory failure EXAM: PORTABLE CHEST 1 VIEW COMPARISON:  Portable chest x-ray of July 03, 2017 FINDINGS:  There remains near total opacification of the right hemithorax. The right chest tube overlies over the posterior third and fourth rib interspace medially and appears to been advanced slightly. There is mild shift of the mediastinum toward the right. The left lung is well-expanded. The interstitial markings are slightly more conspicuous on the left today. The left heart border is less well defined. The pulmonary vascularity is indistinct. There is calcification in the wall of the aortic arch. IMPRESSION: Slight interval increase in the interstitial markings in the left lung when compared to yesterday's study. This may reflect slightly increased pulmonary edema. Stable open near-total opacification of the right hemithorax. Electronically Signed   By: David  Martinique M.D.   On: 07/04/2017 10:33        Scheduled Meds: . mouth rinse  15 mL Mouth Rinse BID  . metoprolol tartrate  50 mg Oral BID  .  metroNIDAZOLE  500 mg Oral Q8H  . sertraline  50 mg Oral Daily   Continuous Infusions: . phenylephrine Stopped (07/01/17 0410)     LOS: 9 days    Time spent: 35 minutes.     Hosie Poisson, MD [facility] Pager [phone]  If 7PM-7AM, please contact night-coverage [email or web address] Password [facility] 07/05/2017, 12:06 PM

## 2017-07-05 NOTE — Care Management Note (Signed)
Case Management Note  Patient Details  Name: Cassidy Sanders MRN: [id number] Date of Birth: Dec 08, 1940  Subjective/Objective:[facility] chosen for HHC-HHRN-pleurx cath drainage/HHPT-[facility] rep [name] aware of d/c in am. [facility] home pleurx cath drain forms on shadow chart for MD signature-MD notified. ! Box cannisters to be delivered in rm per Nsg Secy.                    Action/Plan:d/c home w/HHC.   Expected Discharge Date:  (unknown)               Expected Discharge Plan:  [place]  In-House Referral:     Discharge planning Services  CM Consult  Post Acute Care Choice:    Choice offered to:     DME Arranged:    DME Agency:     HH Arranged:  RN [place] Agency:  [place]  Status of Service:  In process, will continue to follow  If discussed at Long Length of Stay Meetings, dates discussed:    Additional Comments:  Dessa Phi, RN 07/05/2017, 1:42 PM

## 2017-07-05 NOTE — Care Management Important Message (Signed)
Important Message  Patient Details  Name: Nannie Starzyk MRN: [id number] Date of Birth: 01/03/1941   Medicare Important Message Given:  Yes    Kerin Salen 07/05/2017, 11:46 AMImportant Message  Patient Details  Name: Fallon Haecker MRN: [id number] Date of Birth: Jul 12, 1940   Medicare Important Message Given:  Yes    Kerin Salen 07/05/2017, 11:46 AM

## 2017-07-05 NOTE — Progress Notes (Signed)
Pleurex-drain, drained 700cc out, patient tolerated it well without any pain/distress.

## 2017-07-05 NOTE — Progress Notes (Signed)
ANTICOAGULATION CONSULT NOTE - Follow Up Consult  Pharmacy Consult for Eliquis Indication: atrial fibrillation  Allergies  Allergen Reactions  . Latex Dermatitis  . Gabapentin Other (See Comments)    Excessive sleep   . Tape Rash    Patient Measurements: Height: 5\' 9"  (175.3 cm) Weight: 175 lb 7.8 oz (79.6 kg) IBW/kg (Calculated) : 66.2 Heparin Dosing Weight: TBW  Vital Signs: Temp: 97.4 F (36.3 C) (04/25 0846) Temp Source: Oral (04/25 0846) BP: 125/70 (04/25 0846) Pulse Rate: 96 (04/25 0846)  Labs: Recent Labs    07/02/17 2010 07/03/17 0518 07/04/17 0313  HGB  --  8.8* 8.7*  HCT  --  29.3* 28.8*  PLT  --  239 231  HEPARINUNFRC 0.31 0.47 <0.10*  CREATININE  --  0.42* 0.42*    Estimated Creatinine Clearance: 65.5 mL/min (A) (by C-G formula based on SCr of 0.42 mg/dL (L)).   Medications:  Infusions:  . phenylephrine Stopped (07/01/17 0410)    Assessment: 67 yoF with HTN, stage IV NSCLC with malignant R effusion, admitted for C diff colitis.  Pharmacy is consulted to dose Heparin for new onset Afib. CHADS2VASC score of 5.   Significant events: 4/23 Hematuria noted.  RN was given verbal orders.  Heparin gtt stopped at 0935, but orders were not d/c. 4/24 Anticoagulation remains on hold. CBC: Hgb remains low/stable at 8.7, Plt 231. No hematuria reported today.  Today, 07/05/2017: No labs today No further hematuria reported Orders received to start Eliquis  Goal of Therapy:  Therapeutic anticoagulation, prevention of stroke Monitor platelets by anticoagulation protocol: Yes   Plan:  Eliquis 5mg  PO bid Educated patient regarding anticoagulant therapy Coupon voucher provided to patient  Peggyann Juba, PharmD, BCPS Pager: [phone] 07/05/2017 12:49 PM

## 2017-07-06 ENCOUNTER — Telehealth: Payer: Self-pay | Admitting: *Deleted

## 2017-07-06 DIAGNOSIS — Z79899 Other long term (current) drug therapy: Secondary | ICD-10-CM | POA: Diagnosis not present

## 2017-07-06 DIAGNOSIS — R630 Anorexia: Secondary | ICD-10-CM | POA: Diagnosis not present

## 2017-07-06 DIAGNOSIS — F339 Major depressive disorder, recurrent, unspecified: Secondary | ICD-10-CM | POA: Diagnosis not present

## 2017-07-06 DIAGNOSIS — C3401 Malignant neoplasm of right main bronchus: Secondary | ICD-10-CM | POA: Diagnosis not present

## 2017-07-06 DIAGNOSIS — J9621 Acute and chronic respiratory failure with hypoxia: Secondary | ICD-10-CM | POA: Diagnosis not present

## 2017-07-06 DIAGNOSIS — Z9981 Dependence on supplemental oxygen: Secondary | ICD-10-CM | POA: Diagnosis not present

## 2017-07-06 DIAGNOSIS — C3491 Malignant neoplasm of unspecified part of right bronchus or lung: Secondary | ICD-10-CM | POA: Diagnosis not present

## 2017-07-06 DIAGNOSIS — E860 Dehydration: Secondary | ICD-10-CM | POA: Diagnosis not present

## 2017-07-06 DIAGNOSIS — E440 Moderate protein-calorie malnutrition: Secondary | ICD-10-CM | POA: Diagnosis not present

## 2017-07-06 DIAGNOSIS — M6281 Muscle weakness (generalized): Secondary | ICD-10-CM | POA: Diagnosis not present

## 2017-07-06 DIAGNOSIS — I10 Essential (primary) hypertension: Secondary | ICD-10-CM | POA: Diagnosis not present

## 2017-07-06 DIAGNOSIS — R634 Abnormal weight loss: Secondary | ICD-10-CM | POA: Diagnosis not present

## 2017-07-06 DIAGNOSIS — E876 Hypokalemia: Secondary | ICD-10-CM | POA: Diagnosis not present

## 2017-07-06 DIAGNOSIS — R197 Diarrhea, unspecified: Secondary | ICD-10-CM | POA: Diagnosis not present

## 2017-07-06 DIAGNOSIS — Z4682 Encounter for fitting and adjustment of non-vascular catheter: Secondary | ICD-10-CM | POA: Diagnosis not present

## 2017-07-06 DIAGNOSIS — C778 Secondary and unspecified malignant neoplasm of lymph nodes of multiple regions: Secondary | ICD-10-CM | POA: Diagnosis not present

## 2017-07-06 DIAGNOSIS — R5381 Other malaise: Secondary | ICD-10-CM | POA: Diagnosis not present

## 2017-07-06 DIAGNOSIS — C781 Secondary malignant neoplasm of mediastinum: Secondary | ICD-10-CM | POA: Diagnosis not present

## 2017-07-06 DIAGNOSIS — I4891 Unspecified atrial fibrillation: Secondary | ICD-10-CM | POA: Diagnosis not present

## 2017-07-06 DIAGNOSIS — I959 Hypotension, unspecified: Secondary | ICD-10-CM | POA: Diagnosis not present

## 2017-07-06 DIAGNOSIS — Z5112 Encounter for antineoplastic immunotherapy: Secondary | ICD-10-CM | POA: Diagnosis not present

## 2017-07-06 DIAGNOSIS — B370 Candidal stomatitis: Secondary | ICD-10-CM | POA: Diagnosis not present

## 2017-07-06 DIAGNOSIS — J910 Malignant pleural effusion: Secondary | ICD-10-CM | POA: Diagnosis not present

## 2017-07-06 DIAGNOSIS — J90 Pleural effusion, not elsewhere classified: Secondary | ICD-10-CM | POA: Diagnosis not present

## 2017-07-06 MED ORDER — ENSURE ENLIVE PO LIQD: 237.0000 mL | Freq: Two times a day (BID) | ORAL | 12 refills | Status: DC | PRN

## 2017-07-06 MED ORDER — METRONIDAZOLE 500 MG PO TABS: 500.0000 mg | ORAL_TABLET | Freq: Three times a day (TID) | ORAL | 0 refills | Status: AC

## 2017-07-06 MED ORDER — METOPROLOL TARTRATE 50 MG PO TABS: 50.0000 mg | ORAL_TABLET | Freq: Two times a day (BID) | ORAL | 0 refills | Status: DC

## 2017-07-06 MED ORDER — DIPHENHYDRAMINE HCL 25 MG PO CAPS: 25.0000 mg | ORAL_CAPSULE | Freq: Every evening | ORAL | 0 refills | Status: AC | PRN

## 2017-07-06 MED ORDER — LEVALBUTEROL HCL 0.63 MG/3ML IN NEBU: 0.6300 mg | INHALATION_SOLUTION | Freq: Four times a day (QID) | RESPIRATORY_TRACT | 12 refills | Status: AC | PRN

## 2017-07-06 MED ORDER — APIXABAN 5 MG PO TABS: 5.0000 mg | ORAL_TABLET | Freq: Two times a day (BID) | ORAL | 0 refills | Status: DC

## 2017-07-06 NOTE — Telephone Encounter (Signed)
"  Manuela Schwartz CM calling Dr. Julien Nordmann in reference to his patient.  In room 1415 with plans to discharged with PleuRx drain.  Will he follow her in the home for calls if any concerns occur with the catheter?  I can speak with his nurse since he is not in."

## 2017-07-06 NOTE — Progress Notes (Signed)
Patient discharged, transported to SNF[facility] via ambulance. Patient alert/oriented, denies any pain/distress. Skin intact. No wound noted.

## 2017-07-06 NOTE — Clinical Social Work Note (Signed)
Clinical Social Work Assessment  Patient Details  Name: Cassidy Sanders MRN: [id number] Date of Birth: [birth year]  Date of referral:  07/06/17               Reason for consult:  Facility Placement                Permission sought to share information with:  Chartered certified accountant granted to share information::  Yes, Verbal Permission Granted  Name::        Agency::     Relationship::     Contact Information:     Housing/Transportation Living arrangements for the past 2 months:  Independent Living Facility([facility]) Source of Information:  Patient Patient Interpreter Needed:  None Criminal Activity/Legal Involvement Pertinent to Current Situation/Hospitalization:  No - Comment as needed Significant Relationships:  Adult Children, Spouse Lives with:  Spouse Do you feel safe going back to the place where you live?  (PT recommending SNF) Need for family participation in patient care:  No (Coment)  Care giving concerns:  Patient from [place]. Patient admitted with Diarrhea. PT recommending SNF for ST rehab.   Social Worker assessment / plan:  CSW spoke with patient at bedside regarding PT recommendation for SNF. Patient reported original plans to dc home but feels transitional care at [facility] is a better option. CSW agreed to complete FL2 and follow up with [facility] about ability to accept patient.   CSW completed FL2 and followed up with [facility] SNF. Staff reported that they will arrange for patient to be dc to SNF.  CSW will continue to follow and assist with dc planning.  Employment status:  Retired Forensic scientist:  [employer] PT Recommendations:  [place] / Referral to community resources:  (PAtient from [place] to [employer] to [facility] SNF)  Patient/Family's Response to care:  Patient appreciative of CSW assistance with discharge planning to SNF.  Patient/Family's Understanding of and Emotional Response  to Diagnosis, Current Treatment, and Prognosis:  Patient presented calm and verbalized understanding of treatment plan. Patient verbalized plan to dc to [facility] SNF prior to returning to [place].   Emotional Assessment Appearance:  Appears stated age Attitude/Demeanor/Rapport:  Other(Cooperative) Affect (typically observed):  Calm, Pleasant Orientation:  Oriented to Self, Oriented to Place, Oriented to  Time, Oriented to Situation Alcohol / Substance use:  Not Applicable Psych involvement (Current and /or in the community):  No (Comment)  Discharge Needs  Concerns to be addressed:  Care Coordination Readmission within the last 30 days:  No Current discharge risk:  Physical Impairment Barriers to Discharge:  No Barriers Identified   Burnis Medin, LCSW 07/06/2017, 4:06 PM

## 2017-07-06 NOTE — NC FL2 (Signed)
[place] LEVEL OF CARE SCREENING TOOL     IDENTIFICATION  Patient Name: Cassidy Sanders Birthdate: 1940-07-05 Sex: female Admission Date (Current Location): 06/25/2017  [facility] and [place] Number:  Herbalist and Address:  [facility],  [place] [address], [place]      Provider Number: [id number]  Attending Physician Name and Address:  Hosie Poisson, MD  Relative Name and Phone Number:       Current Level of Care: Hospital Recommended Level of Care: [place] Prior Approval Number:    Date Approved/Denied:   PASRR Number: [id number]  Discharge Plan: SNF    Current Diagnoses: Patient Active Problem List   Diagnosis Date Noted  . Malnutrition of moderate degree 07/04/2017  . Bronchospasm   . Hypomagnesemia   . Hypokalemia   . Pleural effusion   . Pleural effusion on right   . Atrial fibrillation with RVR ([place]) 06/30/2017  . Hypotension 06/30/2017  . Diarrhea 06/26/2017  . Chronic respiratory failure with hypoxia ([place]) 03/22/2017  . 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 Physical deconditioning 02/12/2017  . Pain of upper abdomen   . Acute respiratory failure ([place])   . SOB (shortness of breath)   . Hemoptysis   . Essential hypertension 02/01/2017  . Lung mass 02/01/2017  . Mediastinal mass   . 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    Orientation RESPIRATION BLADDER Height & Weight     Self, Time, Situation, Place  O2 Incontinent Weight: 175 lb 7.8 oz (79.6 kg) Height:  5\' 9"  (175.3 cm)  BEHAVIORAL SYMPTOMS/MOOD NEUROLOGICAL BOWEL NUTRITION STATUS      Continent Diet(see dc summary)  AMBULATORY STATUS COMMUNICATION OF NEEDS Skin   Limited Assist Verbally Normal                       Personal Care Assistance Level of Assistance  Bathing, Feeding, Dressing Bathing Assistance: Limited assistance Feeding assistance: Independent Dressing Assistance: Limited assistance      Functional Limitations Info  Sight, Hearing, Speech Sight Info: Adequate Hearing Info: Adequate Speech Info: Adequate    SPECIAL CARE FACTORS FREQUENCY  PT (By licensed PT), OT (By licensed OT)     PT Frequency: 5x/week OT Frequency: 5x/week            Contractures Contractures Info: Not present    Additional Factors Info  Isolation Precautions, Code Status, Allergies, Suctioning Needs Code Status Info: Full Code Allergies Info: Latex; Gabapentin; Tape     Isolation Precautions Info: Enteric Precautions Suctioning Needs: Chest Tube 1 Right;Lateral Pleural 15.5 Fr. (drain as needed)   Current Medications (07/06/2017):  This is the current hospital active medication list Current Facility-Administered Medications  Medication Dose Route Frequency Provider Last Rate Last Dose  . acetaminophen (TYLENOL) tablet 650 mg  650 mg Oral Q6H PRN Mariel Aloe, MD       Or  . acetaminophen (TYLENOL) suppository 650 mg  650 mg Rectal Q6H PRN Mariel Aloe, MD      . apixaban (ELIQUIS) tablet 5 mg  5 mg Oral BID Emiliano Dyer, RPH   5 mg at 07/06/17 1100  . diphenhydrAMINE (BENADRYL) capsule 25 mg  25 mg Oral QHS PRN Mariel Aloe, MD   25 mg at 07/05/17 2111  . feeding supplement (ENSURE ENLIVE) (ENSURE ENLIVE) liquid 237 mL  237 mL Oral BID PRN Hosie Poisson, MD      . levalbuterol Penne Lash) nebulizer  solution 0.63 mg  0.63 mg Nebulization Q6H PRN Hosie Poisson, MD      . MEDLINE mouth rinse  15 mL Mouth Rinse BID Patrecia Pour, Christean Grief, MD   15 mL at 07/06/17 1100  . metoprolol tartrate (LOPRESSOR) tablet 50 mg  50 mg Oral BID Sueanne Margarita, MD   50 mg at 07/06/17 1100  . metroNIDAZOLE (FLAGYL) tablet 500 mg  500 mg Oral Q8H Mariel Aloe, MD   500 mg at 07/06/17 1337  . oxyCODONE-acetaminophen (PERCOCET/ROXICET) 5-325 MG per tablet 1-2 tablet  1-2 tablet Oral Q4H PRN Patrecia Pour, Christean Grief, MD   2 tablet at 07/01/17 1501  . phenylephrine (NEOSYNEPHRINE) 10-0.9 MG/250ML-%  infusion  0-400 mcg/min Intravenous Titrated Mariel Aloe, MD   Stopped at 07/01/17 0410  . sertraline (ZOLOFT) tablet 50 mg  50 mg Oral Daily Mariel Aloe, MD   50 mg at 07/06/17 1100     Discharge Medications: Please see discharge summary for a list of discharge medications.  Relevant Imaging Results:  Relevant Lab Results:   Additional Information SSN  [id number]  Burnis Medin, LCSW

## 2017-07-06 NOTE — Progress Notes (Signed)
Report called to Ricarda Frame at [facility]. Waiting for transportation-PTAR.

## 2017-07-06 NOTE — Care Management Note (Signed)
Case Management Note  Patient Details  Name: Cassidy Sanders MRN: [id number] Date of Birth: [birth year]  Subjective/Objective: Per PT now recc SNF. CSW notified.                   Action/Plan:d/c SNF.   Expected Discharge Date:  (unknown)               Expected Discharge Plan:  Skilled Nursing Facility  In-House Referral:  Clinical Social Work  Discharge planning Services  CM Consult  Post Acute Care Choice:    Choice offered to:     DME Arranged:    DME Agency:     HH Arranged:    HH Agency:     Status of Service:     If discussed at [employer], dates discussed:    Additional Comments:  Dessa Phi, RN 07/06/2017, 11:59 AM

## 2017-07-06 NOTE — Clinical Social Work Placement (Signed)
Patient received and accepted bed offer at [facility]. Facility aware of patient's discharge and confirmed bed offer. PTAR contacted, patient declined for family to be contacted. Patient's RN can call report to [phone] Room 7011, packet complete. CSW signing off, no other needs identified at this time.  CLINICAL SOCIAL WORK PLACEMENT  NOTE  Date:  07/06/2017  Patient Details  Name: Cassidy Sanders MRN: [id number] Date of Birth: [birth year]  Clinical Social Work is seeking post-discharge placement for this patient at the [place] level of care (*CSW will initial, date and re-position this form in  chart as items are completed):  Yes   Patient/family provided with [place] Work Department's list of facilities offering this level of care within the geographic area requested by the patient (or if unable, by the patient's family).  Yes   Patient/family informed of their freedom to choose among providers that offer the needed level of care, that participate in Medicare, Medicaid or managed care program needed by the patient, have an available bed and are willing to accept the patient.  Yes   Patient/family informed of [place]'s ownership interest in [facility] and [facility], as well as of the fact that they are under no obligation to receive care at these facilities.  PASRR submitted to EDS on 07/06/17     PASRR number received on 07/06/17     Existing PASRR number confirmed on       FL2 transmitted to all facilities in geographic area requested by pt/family on 07/06/17     FL2 transmitted to all facilities within larger geographic area on       Patient informed that his/her managed care company has contracts with or will negotiate with certain facilities, including the following:        Yes   Patient/family informed of bed offers received.  Patient chooses bed at [facility] [place] recommends and patient chooses  bed at      Patient to be transferred to [facility] [place] on 07/06/17.  Patient to be transferred to facility by PTAR     Patient family notified on 07/06/17 of transfer.  Name of family member notified:  PAtient declined     PHYSICIAN       Additional Comment:    _______________________________________________ Burnis Medin, LCSW 07/06/2017, 4:20 PM

## 2017-07-06 NOTE — Discharge Summary (Signed)
Physician Discharge Summary  Cassidy Sanders MGQ:[id number] DOB: 1940/10/14 DOA: 06/25/2017  PCP: [name], MD  Admit date: 06/25/2017 Discharge date: 07/06/2017  Admitted From:[facility] Disposition: [facility]  Recommendations for Outpatient Follow-up:  1. Follow up with PCP in 1-2 weeks 2. Please obtain BMP/CBC in one week 3. Please follow up with cardiology and oncology as recommended  Home Health:yes , oxygen and pleurex catheter.    Discharge Condition: Guarded CODE STATUS: Full code Diet recommendation: Heart Healthy  Brief/Interim Summary: Cassidy Sanders 77 y.o.female with a history of metastatic non-small cell carcinoma, hypertension. She presented with diarrhea and found to be dehydrated. Diarrhea has improved with initiation of metronidazole. S/p Pleurex, hospital course complicated with SOB, hypotension and Afib.      Discharge Diagnoses:  Principal Problem:   Diarrhea Active Problems:   Essential hypertension   Physical deconditioning   Malignant neoplasm of posterior mediastinum (HCC)   Atrial fibrillation with RVR (HCC)   Hypotension   Hypomagnesemia   Hypokalemia   Pleural effusion   Pleural effusion on right   Bronchospasm   Malnutrition of moderate degree   Diarrhea: resolved.  Complete the flagyl to complete the course.    Paroxysmal atrial fibrillation:  Rate controlled.  Cardiology consulted and adjusting medications as appropriate. On metoprolol 50 mg BID.  Started eliquis as per cardiology recommendations.    Large  Recurrent right sided effusion:  - s/p pleurex catheter  - drain slowly as needed.    Metastatic non small cell ca: Appreciate oncology recommendations.  Patient currently under the care of Dr. Julien Nordmann and Dr. Lisbeth Renshaw. Patient has been treated with systemic chemotherapy in addition to palliative radiotherapy. Last cycle of chemotherapy (Keytruda, Taxol and Paraplatin) was 06/12/17. Plan to start keytruda from next week.     Acute on chronic respiratory failure with hypoxia:  Probably worsening of the disease and recurrent effusion.  Currently on 4lit of [place] oxygen, increased demand from baseline of 2lit of [place] oxygen.  S/p pleurex catheter, plan for thoracentesis of 524ml  every other day and slowly   Hypokalemia nd hypomagnesemia: Resolved.    Anemia of chronic disease: Hemoglobin stable.     Discharge Instructions  Discharge Instructions    Diet - low sodium heart healthy   Complete by:  As directed    Discharge instructions   Complete by:  As directed    Follow up with oncology and cardiology as recommended.     Allergies as of 07/06/2017      Reactions   Latex Dermatitis   Gabapentin Other (See Comments)   Excessive sleep   Tape Rash      Medication List    STOP taking these medications   albuterol 108 (90 Base) MCG/ACT inhaler Commonly known as:  PROVENTIL HFA;VENTOLIN HFA   chlorpheniramine-HYDROcodone 10-8 MG/5ML Suer Commonly known as:  TUSSIONEX   hydrochlorothiazide 12.5 MG capsule Commonly known as:  MICROZIDE   Lactulose 20 GM/30ML Soln   prochlorperazine 10 MG tablet Commonly known as:  COMPAZINE     TAKE these medications   apixaban 5 MG Tabs tablet Commonly known as:  ELIQUIS Take 1 tablet (5 mg total) by mouth 2 (two) times daily.   Biotin 2.5 MG Tabs Take 2,500 mcg by mouth daily.   diphenhydrAMINE 25 mg capsule Commonly known as:  BENADRYL Take 1 capsule (25 mg total) by mouth at bedtime as needed for sleep.   feeding supplement (ENSURE ENLIVE) Liqd Take 237 mLs by mouth 2 (two) times  daily as needed (If patient or family requests, if pt unable to eat for a meal, or if intakes are poor).   levalbuterol 0.63 MG/3ML nebulizer solution Commonly known as:  XOPENEX Take 3 mLs (0.63 mg total) by nebulization every 6 (six) hours as needed for wheezing or shortness of breath.   loperamide 2 MG capsule Commonly known as:  IMODIUM Take 2 mg by  mouth as needed for diarrhea or loose stools.   metoprolol tartrate 50 MG tablet Commonly known as:  LOPRESSOR Take 1 tablet (50 mg total) by mouth 2 (two) times daily. What changed:  how much to take   metroNIDAZOLE 500 MG tablet Commonly known as:  FLAGYL Take 1 tablet (500 mg total) by mouth every 8 (eight) hours for 12 days.   sertraline 50 MG tablet Commonly known as:  ZOLOFT Take 50 mg by mouth daily.      Follow-up Information    [facility] Follow up.   Specialty:  [place] Why:  [facility] nursing/physical therapy Contact information: [place] [address] [phone]        Isaiah Serge, NP. Go on 07/25/2017.   Specialties:  Cardiology, Radiology Why:  @11am  for post hospital follow up  Contact information: [address] [place] [address] [phone]        [name], MD. Schedule an appointment as soon as possible for a visit in 1 week(s).   Specialty:  Internal Medicine Contact information: [address] [place] [address] [phone]        Josue Hector, MD .   Specialty:  Cardiology Contact information: [address] [place] [address] [phone]          Allergies  Allergen Reactions  . Latex Dermatitis  . Gabapentin Other (See Comments)    Excessive sleep   . Tape Rash    Consultations: Cardiology Critical care  Procedures/Studies: Ct Chest W Contrast  Result Date: 06/27/2017 CLINICAL DATA:  Known right hilar non-small-cell lung cancer, hypertension, dehydration, decreased breath sounds, pleural effusion EXAM: CT CHEST WITH CONTRAST TECHNIQUE: Multidetector CT imaging of the chest was performed during intravenous contrast administration. CONTRAST:  20mL OMNIPAQUE IOHEXOL 300 MG/ML  SOLN COMPARISON:  06/27/2017 chest x-ray, 03/12/2017 PET-CT, 01/31/2017 chest CT FINDINGS: Cardiovascular: Atherosclerosis of the major branch vessels  and aorta without aneurysm, dissection, mediastinal hemorrhage/hematoma. Central pulmonary arteries appear patent. Native coronary atherosclerosis noted. Normal heart size. No pericardial effusion. Mediastinum/Nodes: Known right hilar/mediastinal lung mass is obscured by the complete right lung collapse/consolidation related to the large right pleural effusion. Small inferior right thyroid nodules noted. Trachea and left central airways are patent. Right mainstem bronchus and bronchus intermedius appear occluded centrally. No significant or bulky adenopathy demonstrated. Lungs/Pleura: Ill-defined right hilar/mediastinal mass again obscured by right lung complete collapse/consolidation and the large effusion on the right. Trace left pleural effusion dependently. Anterior left upper lobe patchy airspace process with central air bronchograms may represent infectious/inflammatory process or pneumonia the left upper lobe. Minor lingula and left basilar atelectasis. Upper Abdomen: Stable bilateral adrenal nodules, previously demonstrated to be adenomas by PET-CT. Abdominal atherosclerosis noted. No acute upper abdominal finding. Musculoskeletal: Degenerative changes noted of the spine. Endplate osteophytes throughout. Chronic appearing compression fracture at T12. No definite focal osseous abnormality. Sternum intact. IMPRESSION: Recurrent large right pleural effusion with complete right lung collapse/consolidation now obscuring the known right hilar/mediastinal lung mass. Anterior left upper lobe patchy airspace  process with central air bronchograms concerning for left upper lobe pneumonia Minor lingula and left lower lobe atelectasis and trace left effusion. Aortic atherosclerosis and native coronary atherosclerosis Aortic aneurysm NOS (ICD10-I71.9). Electronically Signed   By: Jerilynn Mages.  Shick M.D.   On: 06/27/2017 17:13   Ir Guided Niel Hummer W Catheter Placement  Result Date: 06/29/2017 CLINICAL DATA:  Large malignant right  pleural effusion EXAM: INSERTION OF TUNNELED PLEURAL DRAINAGE CATHETER THORACENTESIS RIGHT ANESTHESIA/SEDATION: Intravenous Fentanyl and Versed were administered as conscious sedation during continuous monitoring of the patient's level of consciousness and physiological / cardiorespiratory status by the radiology RN, with a total moderate sedation time of 10 minutes. Marland Kitchen MEDICATIONS: Cefazolin 2 g IV. Antibiotic was administered in an appropriate time interval for the procedure. FLUOROSCOPY TIME:  6 seconds; 1 mGy PROCEDURE: The procedure, risks, benefits, and alternatives were explained to the patient. Questions regarding the procedure were encouraged and answered. The patient understands and consents to the procedure. Survey ultrasound of the right chest was performed an appropriate skin entry sites determined and marked. The right chest wall was prepped with chlorhexidine in a sterile fashion, and a sterile drape was applied covering the operative field. A sterile gown and sterile gloves were used for the procedure. Local anesthesia was provided with 1% Lidocaine. Ultrasound image documentation was performed. Fluoroscopy during the procedure and fluoroscopic spot radiograph confirms appropriate catheter position. After creating a small skin incision, a 19 gauge sheath needle was advanced into the pleural cavity under ultrasound guidance. A guide wire was then advanced under fluoroscopy into the pleural space. Pleural access was dilated serially and a 16-French peel-away sheath placed. A tunneled cuffed PleurX catheter was placed. This was tunneled from an incision 5 cm inferomedial to the pleural access to the access site. The catheter was advanced through the peel-away sheath. The sheath was then removed. Final catheter positioning was confirmed with a fluoroscopic spot image. The access incision was closed with Dermabond applied to the incision. A Prolene retention suture was applied at the catheter exit site.  Large volume 1.5 L thoracentesis was performed through the new catheter, a sample sent for the requested laboratory studies. The patient tolerated the procedure well. COMPLICATIONS: None. FINDINGS: Large right pleural effusion confirmed on ultrasound. The catheter was placed via the right lateral chest wall. Catheter course is towards the apex. Approximately 1.5 liters of pleural fluid was able to be removed after catheter placement. The patient will be brought back in 10 to 14days to remove the temporary exit site retention suture after appropriate in-growth around the subcutaneous cuff of the catheter. IMPRESSION: Placement of permanent, tunneled right pleural drainage catheter via lateral approach. 1.5 liters of pleural fluid was removed today after catheter placement. Electronically Signed   By: Lucrezia Europe M.D.   On: 06/29/2017 17:04   Ir US Guide Bx Asp/drain  Result Date: 06/29/2017 CLINICAL DATA:  Large malignant right pleural effusion EXAM: INSERTION OF TUNNELED PLEURAL DRAINAGE CATHETER THORACENTESIS RIGHT ANESTHESIA/SEDATION: Intravenous Fentanyl and Versed were administered as conscious sedation during continuous monitoring of the patient's level of consciousness and physiological / cardiorespiratory status by the radiology RN, with a total moderate sedation time of 10 minutes. Marland Kitchen MEDICATIONS: Cefazolin 2 g IV. Antibiotic was administered in an appropriate time interval for the procedure. FLUOROSCOPY TIME:  6 seconds; 1 mGy PROCEDURE: The procedure, risks, benefits, and alternatives were explained to the patient. Questions regarding the procedure were encouraged and answered. The patient understands and consents to the procedure. Survey ultrasound  of the right chest was performed an appropriate skin entry sites determined and marked. The right chest wall was prepped with chlorhexidine in a sterile fashion, and a sterile drape was applied covering the operative field. A sterile gown and sterile gloves  were used for the procedure. Local anesthesia was provided with 1% Lidocaine. Ultrasound image documentation was performed. Fluoroscopy during the procedure and fluoroscopic spot radiograph confirms appropriate catheter position. After creating a small skin incision, a 19 gauge sheath needle was advanced into the pleural cavity under ultrasound guidance. A guide wire was then advanced under fluoroscopy into the pleural space. Pleural access was dilated serially and a 16-French peel-away sheath placed. A tunneled cuffed PleurX catheter was placed. This was tunneled from an incision 5 cm inferomedial to the pleural access to the access site. The catheter was advanced through the peel-away sheath. The sheath was then removed. Final catheter positioning was confirmed with a fluoroscopic spot image. The access incision was closed with Dermabond applied to the incision. A Prolene retention suture was applied at the catheter exit site. Large volume 1.5 L thoracentesis was performed through the new catheter, a sample sent for the requested laboratory studies. The patient tolerated the procedure well. COMPLICATIONS: None. FINDINGS: Large right pleural effusion confirmed on ultrasound. The catheter was placed via the right lateral chest wall. Catheter course is towards the apex. Approximately 1.5 liters of pleural fluid was able to be removed after catheter placement. The patient will be brought back in 10 to 14days to remove the temporary exit site retention suture after appropriate in-growth around the subcutaneous cuff of the catheter. IMPRESSION: Placement of permanent, tunneled right pleural drainage catheter via lateral approach. 1.5 liters of pleural fluid was removed today after catheter placement. Electronically Signed   By: Lucrezia Europe M.D.   On: 06/29/2017 17:04   Dg Chest Port 1 View  Result Date: 07/04/2017 CLINICAL DATA:  Respiratory failure EXAM: PORTABLE CHEST 1 VIEW COMPARISON:  Portable chest x-ray of  July 03, 2017 FINDINGS: There remains near total opacification of the right hemithorax. The right chest tube overlies over the posterior third and fourth rib interspace medially and appears to been advanced slightly. There is mild shift of the mediastinum toward the right. The left lung is well-expanded. The interstitial markings are slightly more conspicuous on the left today. The left heart border is less well defined. The pulmonary vascularity is indistinct. There is calcification in the wall of the aortic arch. IMPRESSION: Slight interval increase in the interstitial markings in the left lung when compared to yesterday's study. This may reflect slightly increased pulmonary edema. Stable open near-total opacification of the right hemithorax. Electronically Signed   By: David  Martinique M.D.   On: 07/04/2017 10:33   Dg Chest Port 1 View  Result Date: 07/03/2017 CLINICAL DATA:  Pleural effusion, shortness of Breath EXAM: PORTABLE CHEST 1 VIEW COMPARISON:  07/02/2017 FINDINGS: Left PleurX catheter noted in place. Complete opacification of the right hemithorax, stable. Mild perihilar and lower lobe opacities on the left, stable. Heart is normal size. No acute bony abnormality. IMPRESSION: Stable complete whiteout of the right hemithorax with PleurX catheter in place. Stable left perihilar and lower lobe airspace opacities. Electronically Signed   By: Rolm Baptise M.D.   On: 07/03/2017 09:02   Dg Chest Port 1 View  Result Date: 07/02/2017 CLINICAL DATA:  Follow-up right pleural effusion. EXAM: PORTABLE CHEST 1 VIEW COMPARISON:  Portable chest x-ray of July 01, 2017. FINDINGS: The left lung  is well-expanded. There is mild shift of mediastinum toward the right which is stable. There is opacification of nearly all of the right hemithorax but a small amount of aerated right upper lung is present today. The right pleural drainage catheter tip projects over the medial aspect of the fourth and fifth posterior rib  interspace. The left heart border is normal. The central pulmonary vascularity is mildly prominent. There is no left pleural effusion. IMPRESSION: Slight interval improvement in the appearance of the right hemithorax with a small amount of aeration of the right upper lobe. Stable appearance of the right pleural drainage catheter. Electronically Signed   By: David  Martinique M.D.   On: 07/02/2017 08:24   Dg Chest Port 1 View  Result Date: 07/01/2017 CLINICAL DATA:  Shortness of breath. Status post pleural drainage catheter 06/29/2017. History of lung cancer. EXAM: PORTABLE CHEST 1 VIEW COMPARISON:  Single-view of the chest 06/30/2017 and 06/27/2017. CT chest 06/27/2017. FINDINGS: Pleural drainage catheter is again seen. There is complete whiteout of the right chest consistent with the presence of a large effusion. 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 Cardiac silhouette is largely obscured. No pneumothorax. IMPRESSION: No change in complete whiteout of the right chest consistent with pleural effusion and airspace disease. Pleural drainage catheter is in place. No new abnormality. Electronically Signed   By: Inge Rise M.D.   On: 07/01/2017 10:58   Dg Chest Port 1 View  Result Date: 06/30/2017 CLINICAL DATA:  Right pleural effusion. EXAM: PORTABLE CHEST 1 VIEW COMPARISON:  06/30/2017 FINDINGS: Continued complete opacification of the right hemithorax with right PleurX catheter remaining in place. No real change. No confluent opacity on the left. Heart is likely normal size. IMPRESSION: No significant change in the white out of the right lung. Electronically Signed   By: Rolm Baptise M.D.   On: 06/30/2017 18:56   Dg Chest Port 1 View  Result Date: 06/30/2017 CLINICAL DATA:  Shortness of breath today. Status post pleural drainage catheter placement 06/29/2017. Lung cancer. EXAM: PORTABLE CHEST 1 VIEW COMPARISON:  Single-view of the chest and CT chest 06/27/2017. FINDINGS: Pleural  drainage catheter on the right is identified. There is complete whiteout of the right chest which is unchanged. No focal airspace disease or effusion on the left. No pneumothorax. Cardiac silhouette is largely obscured. IMPRESSION: Persistent complete whiteout of the right chest consistent with a large effusion and airspace disease. Pleural drainage catheter is in place on the right. Electronically Signed   By: Inge Rise M.D.   On: 06/30/2017 12:11   Dg Chest Port 1 View  Result Date: 06/27/2017 CLINICAL DATA:  77 year old female with metastatic non-small cell lung cancer. Shortness of breath for 2 days and diarrhea. Dehydrated, tachycardia. EXAM: PORTABLE CHEST 1 VIEW COMPARISON:  05/17/2017 radiographs and earlier. FINDINGS: Portable AP upright view at 1253 hours. New virtually complete whiteout of the right hemithorax. Minimal aerated lung suspected about the right hilum. Abrupt cut off of the right mainstem bronchus. No definite mediastinal shift since March. Stable left lung. Stable left mediastinal contours. No acute osseous abnormality identified. IMPRESSION: 1. New nonspecific white out of the right hemithorax. This could reflect a combination of collapse, consolidation, and pleural effusion. Chest CT (IV contrast preferred) would best characterize further. 2. Stable left lung. Electronically Signed   By: Genevie Ann M.D.   On: 06/27/2017 13:27       Subjective:  No new complaints.  Discharge Exam: Vitals:   07/06/17 1100 07/06/17  1342  BP: 131/88 (!) 96/58  Pulse: 97 88  Resp:  18  Temp:  98.3 F (36.8 C)  SpO2:  98%   Vitals:   07/05/17 2112 07/06/17 0515 07/06/17 1100 07/06/17 1342  BP: (!) 139/91 (!) 143/76 131/88 (!) 96/58  Pulse: (!) 106 87 97 88  Resp: 20 20  18   Temp: 98.1 F (36.7 C) 97.9 F (36.6 C)  98.3 F (36.8 C)  TempSrc: Oral Oral  Oral  SpO2: 91% 98%  98%  Weight:      Height:        General: Pt is alert, awake, not in acute distress Cardiovascular:  RRR, S1/S2 +, no rubs, no gallops Respiratory: CTA bilaterally, no wheezing, no rhonchi Abdominal: Soft, NT, ND, bowel sounds + Extremities: no edema, no cyanosis    The results of significant diagnostics from this hospitalization (including imaging, microbiology, ancillary and laboratory) are listed below for reference.     Microbiology: Recent Results (from the past 240 hour(s))  MRSA PCR Screening     Status: None   Collection Time: 06/30/17  7:44 PM  Result Value Ref Range Status   MRSA by PCR NEGATIVE NEGATIVE Final    Comment:        The GeneXpert MRSA Assay (FDA approved for NASAL specimens only), is one component of a comprehensive MRSA colonization surveillance program. It is not intended to diagnose MRSA infection nor to guide or monitor treatment for MRSA infections. Performed at [facility], [place] [address] [place], [place] [address]      Labs: BNP (last 3 results) No results for input(s): BNP in the last 8760 hours. Basic Metabolic Panel: Recent Labs  Lab 06/30/17 0609 07/01/17 0348 07/02/17 0356 07/03/17 0518 07/04/17 0313  NA 140 138 138 140 142  K 3.2* 3.5 3.2* 4.0 3.6  CL 101 105 104 109 105  CO2 29 26 24 24 28   GLUCOSE 92 102* 113* 100* 102*  BUN 6 8 7  5* <5*  CREATININE 0.43* 0.47 0.35* 0.42* 0.42*  CALCIUM 8.5* 8.0* 8.0* 8.2* 8.2*  MG 1.7 1.6* 1.9 1.9 1.8  PHOS  --   --   --  2.8  --    Liver Function Tests: No results for input(s): AST, ALT, ALKPHOS, BILITOT, PROT, ALBUMIN in the last 168 hours. No results for input(s): LIPASE, AMYLASE in the last 168 hours. No results for input(s): AMMONIA in the last 168 hours. CBC: Recent Labs  Lab 07/01/17 0348 07/02/17 0356 07/03/17 0518 07/04/17 0313  WBC 7.5 7.9 6.4 6.1  HGB 8.6* 8.7* 8.8* 8.7*  HCT 27.7* 27.7* 29.3* 28.8*  MCV 85.8 85.5 85.7 86.0  PLT 225 224 239 231   Cardiac Enzymes: No results for input(s): CKTOTAL, CKMB, CKMBINDEX, TROPONINI in the last 168  hours. BNP: Invalid input(s): POCBNP CBG: No results for input(s): GLUCAP in the last 168 hours. D-Dimer No results for input(s): DDIMER in the last 72 hours. Hgb A1c No results for input(s): HGBA1C in the last 72 hours. Lipid Profile No results for input(s): CHOL, HDL, LDLCALC, TRIG, CHOLHDL, LDLDIRECT in the last 72 hours. Thyroid function studies No results for input(s): TSH, T4TOTAL, T3FREE, THYROIDAB in the last 72 hours.  Invalid input(s): FREET3 Anemia work up No results for input(s): VITAMINB12, FOLATE, FERRITIN, TIBC, IRON, RETICCTPCT in the last 72 hours. Urinalysis No results found for: COLORURINE, APPEARANCEUR, LABSPEC, [place], GLUCOSEU, HGBUR, BILIRUBINUR, KETONESUR, PROTEINUR, UROBILINOGEN, NITRITE, LEUKOCYTESUR Sepsis Labs Invalid input(s): PROCALCITONIN,  WBC,  [place] [facility] Recent Results (from the past 240 hour(s))  MRSA PCR Screening     Status: None   Collection Time: 06/30/17  7:44 PM  Result Value Ref Range Status   MRSA by PCR NEGATIVE NEGATIVE Final    Comment:        The GeneXpert MRSA Assay (FDA approved for NASAL specimens only), is one component of a comprehensive MRSA colonization surveillance program. It is not intended to diagnose MRSA infection nor to guide or monitor treatment for MRSA infections. Performed at [facility], [place] [address] [place], [place] [address]      Time coordinating discharge: 36 minutes  SIGNED:   Hosie Poisson, MD  [facility] 07/06/2017, 1:43 PM Pager   If 7PM-7AM, please contact night-coverage [email or web address] Password [email or web address]

## 2017-07-06 NOTE — Progress Notes (Signed)
Physical Therapy Treatment Patient Details Name: Cassidy Sanders MRN: [id number] DOB: [birth year] Today's Date: 07/06/2017    History of Present Illness Cassidy Sanders is a 77 y.o. female with a history of metastatic non-small cell carcinoma, hypertension. She presented with diarrhea and found to be dehydrated.    PT Comments    Patient progressing back to short distance ambulation this session, but not safe for doing it on her own and with much higher O2 requirement and the length of time here feel she would benefit from STSNF level rehab.  She states spouse currently in transitional care at [facility] as well.  PT to follow until d/c.   Follow Up Recommendations  SNF;Supervision/Assistance - 24 hour("transitional care" at [facility])     Equipment Recommendations  None recommended by PT    Recommendations for Other Services       Precautions / Restrictions Precautions Precautions: Fall Restrictions Weight Bearing Restrictions: No    Mobility  Bed Mobility Overal bed mobility: Modified Independent                Transfers Overall transfer level: Needs assistance Equipment used: Rolling walker (2 wheeled) Transfers: Sit to/from Stand Sit to Stand: Min guard         General transfer comment: assist for balance  Ambulation/Gait Ambulation/Gait assistance: Min guard Ambulation Distance (Feet): 30 Feet Assistive device: Rolling walker (2 wheeled) Gait Pattern/deviations: Step-through pattern;Decreased stride length     General Gait Details: on 5L O2 SpO2 87% at lowest, back up to 90's in <30 sec; able to turn safely with walker and not rushing to chair, but reported could not go a second time   Chief Strategy Officer    Modified Rankin (Stroke Patients Only)       Balance Overall balance assessment: Needs assistance   Sitting balance-Leahy Scale: Good       Standing balance-Leahy Scale: Fair                               Cognition Arousal/Alertness: Awake/alert Behavior During Therapy: WFL for tasks assessed/performed Overall Cognitive Status: Within Functional Limits for tasks assessed                                        Exercises      General Comments        Pertinent Vitals/Pain Pain Assessment: No/denies pain    Home Living                      Prior Function            PT Goals (current goals can now be found in the care plan section) Progress towards PT goals: Progressing toward goals    Frequency    Min 3X/week      PT Plan Discharge plan needs to be updated    Co-evaluation              AM-PAC PT "6 Clicks" Daily Activity  Outcome Measure  Difficulty turning over in bed (including adjusting bedclothes, sheets and blankets)?: None Difficulty moving from lying on back to sitting on the side of the bed? : A Little Difficulty sitting down on and standing up from a chair with arms (e.g., wheelchair, bedside commode, etc,.)?:  A Lot Help needed moving to and from a bed to chair (including a wheelchair)?: A Little Help needed walking in hospital room?: A Little Help needed climbing 3-5 steps with a railing? : A Lot 6 Click Score: 17    End of Session Equipment Utilized During Treatment: Gait belt;Oxygen Activity Tolerance: Patient limited by fatigue Patient left: in chair;with call bell/phone within reach   PT Visit Diagnosis: Other abnormalities of gait and mobility (R26.89);Muscle weakness (generalized) (M62.81)     Time: 9969-2493 PT Time Calculation (min) (ACUTE ONLY): 28 min  Charges:  $Gait Training: 8-22 mins $Self Care/Home Management: 8-22                    G CodesMagda Kiel, [place] 07/06/2017    Reginia Naas 07/06/2017, 12:28 PM

## 2017-07-09 DIAGNOSIS — J910 Malignant pleural effusion: Secondary | ICD-10-CM | POA: Diagnosis not present

## 2017-07-09 DIAGNOSIS — F339 Major depressive disorder, recurrent, unspecified: Secondary | ICD-10-CM | POA: Diagnosis not present

## 2017-07-09 DIAGNOSIS — I4891 Unspecified atrial fibrillation: Secondary | ICD-10-CM | POA: Diagnosis not present

## 2017-07-09 DIAGNOSIS — M6281 Muscle weakness (generalized): Secondary | ICD-10-CM | POA: Diagnosis not present

## 2017-07-11 ENCOUNTER — Telehealth: Payer: Self-pay | Admitting: Oncology

## 2017-07-11 ENCOUNTER — Other Ambulatory Visit: Payer: Medicare Other

## 2017-07-11 NOTE — Telephone Encounter (Signed)
Called Cassidy Sanders regarding voicemail however Erasmo Downer said that the time would not be able to change

## 2017-07-13 ENCOUNTER — Encounter: Payer: Self-pay | Admitting: Oncology

## 2017-07-13 ENCOUNTER — Inpatient Hospital Stay: Payer: Medicare Other

## 2017-07-13 ENCOUNTER — Inpatient Hospital Stay: Payer: Medicare Other | Attending: Internal Medicine | Admitting: Oncology

## 2017-07-13 ENCOUNTER — Telehealth: Payer: Self-pay | Admitting: Oncology

## 2017-07-13 VITALS — BP 70/40 | HR 87 | Temp 97.9°F | Resp 22

## 2017-07-13 DIAGNOSIS — Z9981 Dependence on supplemental oxygen: Secondary | ICD-10-CM | POA: Insufficient documentation

## 2017-07-13 DIAGNOSIS — I4891 Unspecified atrial fibrillation: Secondary | ICD-10-CM | POA: Insufficient documentation

## 2017-07-13 DIAGNOSIS — Z4682 Encounter for fitting and adjustment of non-vascular catheter: Secondary | ICD-10-CM | POA: Insufficient documentation

## 2017-07-13 DIAGNOSIS — R197 Diarrhea, unspecified: Secondary | ICD-10-CM | POA: Diagnosis not present

## 2017-07-13 DIAGNOSIS — R630 Anorexia: Secondary | ICD-10-CM | POA: Diagnosis not present

## 2017-07-13 DIAGNOSIS — Z7189 Other specified counseling: Secondary | ICD-10-CM | POA: Insufficient documentation

## 2017-07-13 DIAGNOSIS — C382 Malignant neoplasm of posterior mediastinum: Secondary | ICD-10-CM

## 2017-07-13 DIAGNOSIS — B370 Candidal stomatitis: Secondary | ICD-10-CM | POA: Insufficient documentation

## 2017-07-13 DIAGNOSIS — C778 Secondary and unspecified malignant neoplasm of lymph nodes of multiple regions: Secondary | ICD-10-CM | POA: Insufficient documentation

## 2017-07-13 DIAGNOSIS — C3491 Malignant neoplasm of unspecified part of right bronchus or lung: Secondary | ICD-10-CM | POA: Diagnosis not present

## 2017-07-13 DIAGNOSIS — R634 Abnormal weight loss: Secondary | ICD-10-CM | POA: Diagnosis not present

## 2017-07-13 DIAGNOSIS — Z5112 Encounter for antineoplastic immunotherapy: Secondary | ICD-10-CM | POA: Diagnosis not present

## 2017-07-13 DIAGNOSIS — Z79899 Other long term (current) drug therapy: Secondary | ICD-10-CM | POA: Insufficient documentation

## 2017-07-13 DIAGNOSIS — R5382 Chronic fatigue, unspecified: Secondary | ICD-10-CM

## 2017-07-13 DIAGNOSIS — J910 Malignant pleural effusion: Secondary | ICD-10-CM | POA: Insufficient documentation

## 2017-07-13 DIAGNOSIS — I959 Hypotension, unspecified: Secondary | ICD-10-CM | POA: Diagnosis not present

## 2017-07-13 LAB — CBC WITH DIFFERENTIAL (CANCER CENTER ONLY)
BASOS PCT: 0 %
Basophils Absolute: 0 10*3/uL (ref 0.0–0.1)
Eosinophils Absolute: 0.2 10*3/uL (ref 0.0–0.5)
Eosinophils Relative: 2 %
HCT: 34.9 % (ref 34.8–46.6)
HEMOGLOBIN: 10.6 g/dL — AB (ref 11.6–15.9)
Lymphocytes Relative: 13 %
Lymphs Abs: 0.8 10*3/uL — ABNORMAL LOW (ref 0.9–3.3)
MCH: 26.8 pg (ref 25.1–34.0)
MCHC: 30.4 g/dL — AB (ref 31.5–36.0)
MCV: 88.4 fL (ref 79.5–101.0)
MONO ABS: 0.5 10*3/uL (ref 0.1–0.9)
MONOS PCT: 7 %
NEUTROS PCT: 78 %
Neutro Abs: 5.1 10*3/uL (ref 1.5–6.5)
Platelet Count: 296 10*3/uL (ref 145–400)
RBC: 3.95 MIL/uL (ref 3.70–5.45)
RDW: 20.8 % — AB (ref 11.2–14.5)
WBC Count: 6.6 10*3/uL (ref 3.9–10.3)

## 2017-07-13 LAB — TSH: TSH: 4.989 u[IU]/mL — ABNORMAL HIGH (ref 0.308–3.960)

## 2017-07-13 LAB — CMP (CANCER CENTER ONLY)
ALBUMIN: 2.2 g/dL — AB (ref 3.5–5.0)
ALT: 8 U/L (ref 0–55)
AST: 17 U/L (ref 5–34)
Alkaline Phosphatase: 79 U/L (ref 40–150)
Anion gap: 7 (ref 3–11)
BUN: 9 mg/dL (ref 7–26)
CO2: 28 mmol/L (ref 22–29)
CREATININE: 0.68 mg/dL (ref 0.60–1.10)
Calcium: 8.9 mg/dL (ref 8.4–10.4)
Chloride: 103 mmol/L (ref 98–109)
GFR, Estimated: >60 mL/min (ref >60)
Glucose, Bld: 140 mg/dL (ref 70–140)
Potassium: 3.9 mmol/L (ref 3.5–5.1)
SODIUM: 138 mmol/L (ref 136–145)
TOTAL PROTEIN: 5.9 g/dL — AB (ref 6.4–8.3)
Total Bilirubin: 0.5 mg/dL (ref 0.2–1.2)

## 2017-07-13 MED ORDER — NYSTATIN 100000 UNIT/ML MT SUSP: 5.0000 mL | Freq: Four times a day (QID) | OROMUCOSAL | 0 refills | Status: DC

## 2017-07-13 NOTE — Telephone Encounter (Signed)
Cancelled appt per 5/3 los. -

## 2017-07-13 NOTE — Progress Notes (Signed)
[facility] OFFICE PROGRESS NOTE  Cassidy Panda, MD [address] [place] [address]  DIAGNOSIS: Metastatic non-small cell carcinoma (T3, N2, M1a) non-small cell lung cancer diagnosed in November 2018 and presented with large right suprahilar and mediastinal mass as well as recent right pleural effusion.  Guardant 360: No actionable mutations.  PRIOR THERAPY: Palliative radiotherapy to the large mediastinal mass under the care of Dr. Lisbeth Sanders.  CURRENT THERAPY: Systemic chemotherapy with carboplatin for AUC of 5, paclitaxel 175 mg/M2 and Keytruda 200 mg IV every 3 weeks.  First dose expected May 01, 2017.  Status post 3 cycles.  The patient will begin single agent Keytruda 200 mg IV every 3 weeks beginning with cycle #4.  INTERVAL HISTORY: Cassidy Sanders 77 y.o. female returns for a routine follow-up visit accompanied by her daughters.  Patient was recently discharged from the hospital and is now at a skilled nursing facility for rehabilitation.  During her hospitalization, patient had diarrhea thought to be due to C. difficile.  They were unable to obtain of specimen to confirm the diagnosis but she was started on metronidazole with some improvement in the diarrhea.  She has about 5 to 6 days left on her medication.  She reports that she does have frequent stools that are not completely formed but are not watery diarrhea like she has had in the past.  The patient also had a Pleurx catheter placed for recurrent right-sided pleural effusion.  They have been draining the Pleurx daily with about 600 to 750 cc/day.  Her breathing has improved.  She remains on oxygen at 4 L/min.  She reports that she remains very tired.  She is not sure that she wants to proceed with any additional treatment for her lung cancer.  The patient reports that she is meeting with hospice later today to hear about her services.  The patient denies fevers and chills.  She denies chest pain, cough,  hemoptysis.  Denies nausea, vomiting, constipation.  She reports a decreased appetite and reports that she has lost weight.  She did not stand to be weighed today.  The patient is here for evaluation prior to cycle 4 of her treatment.  MEDICAL HISTORY: Past Medical History:  Diagnosis Date  . Brain tumor (benign) ([place])   . Depression   . Hypertension     ALLERGIES:  is allergic to latex; gabapentin; and tape.  MEDICATIONS:  Current Outpatient Medications  Medication Sig Dispense Refill  . apixaban (ELIQUIS) 5 MG TABS tablet Take 1 tablet (5 mg total) by mouth 2 (two) times daily. 60 tablet 0  . Biotin 2.5 MG TABS Take 2,500 mcg by mouth daily.    . diphenhydrAMINE (BENADRYL) 25 mg capsule Take 1 capsule (25 mg total) by mouth at bedtime as needed for sleep. 30 capsule 0  . feeding supplement, ENSURE ENLIVE, (ENSURE ENLIVE) LIQD Take 237 mLs by mouth 2 (two) times daily as needed (If patient or family requests, if pt unable to eat for a meal, or if intakes are poor). 237 mL 12  . metoprolol tartrate (LOPRESSOR) 50 MG tablet Take 1 tablet (50 mg total) by mouth 2 (two) times daily. 60 tablet 0  . metroNIDAZOLE (FLAGYL) 500 MG tablet Take 1 tablet (500 mg total) by mouth every 8 (eight) hours for 12 days. 36 tablet 0  . sertraline (ZOLOFT) 50 MG tablet Take 50 mg by mouth daily.    Marland Kitchen levalbuterol (XOPENEX) 0.63 MG/3ML nebulizer solution Take 3 mLs (0.63  mg total) by nebulization every 6 (six) hours as needed for wheezing or shortness of breath. (Patient not taking: Reported on 07/13/2017) 3 mL 12  . loperamide (IMODIUM) 2 MG capsule Take 2 mg by mouth as needed for diarrhea or loose stools.    . nystatin (MYCOSTATIN) 100000 UNIT/ML suspension Take 5 mLs (500,000 Units total) by mouth 4 (four) times daily. For 10 days. 200 mL 0   No current facility-administered medications for this visit.     SURGICAL HISTORY:  Past Surgical History:  Procedure Laterality Date  . ABDOMINAL HYSTERECTOMY     . BLADDER SURGERY    . BRAIN SURGERY    . ENDOBRONCHIAL ULTRASOUND Bilateral 02/06/2017   Procedure: ENDOBRONCHIAL ULTRASOUND;  Surgeon: Juanito Doom, MD;  Location: [facility];  Service: Cardiopulmonary;  Laterality: Bilateral;  . IR GUIDED DRAIN W CATHETER PLACEMENT  06/29/2017  . IR US GUIDE BX ASP/DRAIN  06/29/2017  . VIDEO BRONCHOSCOPY WITH ENDOBRONCHIAL ULTRASOUND N/A 03/23/2017   Procedure: VIDEO BRONCHOSCOPY WITH ENDOBRONCHIAL ULTRASOUND;  Surgeon: Juanito Doom, MD;  Location: [facility] OR;  Service: Thoracic;  Laterality: N/A;    REVIEW OF SYSTEMS:   Review of Systems  Constitutional: Negative for chills, fatigue, fever. Positive for fatigue, decreased appetite, and weight loss. HENT:   Negative for mouth sores, nosebleeds, sore throat and trouble swallowing.   Eyes: Negative for eye problems and icterus.  Respiratory: Negative for cough, hemoptysis, and wheezing.  Positive for shortness of breath.  She wears home oxygen at 4 L/min.  Cardiovascular: Negative for chest pain and leg swelling.  Gastrointestinal: Negative for abdominal pain, constipation, nausea and vomiting. Positive for loose stools. Genitourinary: Negative for bladder incontinence, difficulty urinating, dysuria, frequency and hematuria.   Musculoskeletal: Negative for back pain, neck pain and neck stiffness.  Skin: Negative for itching and rash.  Neurological: Negative for dizziness, extremity weakness, headaches, light-headedness and seizures.  Hematological: Negative for adenopathy. Does not bruise/bleed easily.  Psychiatric/Behavioral: Negative for confusion, depression and sleep disturbance. The patient is not nervous/anxious.     PHYSICAL EXAMINATION:  Blood pressure (!) 70/40, pulse 87, temperature 97.9 F (36.6 C), temperature source Oral, resp. rate (!) 22, SpO2 94 %.  ECOG PERFORMANCE STATUS: 1 - Symptomatic but completely ambulatory  Physical Exam  Constitutional: Oriented to person, place,  and time. No distress.  HENT:  Head: Normocephalic and atraumatic.  Mouth/Throat: Oropharynx is moist.  Thrush noted to tongue. Eyes: Conjunctivae are normal. Right eye exhibits no discharge. Left eye exhibits no discharge. No scleral icterus.  Neck: Normal range of motion. Neck supple.  Cardiovascular: Normal rate, regular rhythm, normal heart sounds and intact distal pulses.   Pulmonary/Chest: Effort normal. No respiratory distress. No wheezes. No rales. Diminished breath sounds to the right mid lung down to the base. Abdominal: Soft. Bowel sounds are normal. Exhibits no distension and no mass. There is no tenderness.  Musculoskeletal: Normal range of motion. Exhibits no edema.  Lymphadenopathy:    No cervical adenopathy.  Neurological: Alert and oriented to person, place, and time. Exhibits normal muscle tone. Coordination normal.  Skin: Skin is warm and dry. No rash noted. Not diaphoretic. No erythema. No pallor.  Psychiatric: Mood, memory and judgment normal.  Vitals reviewed.  LABORATORY DATA: Lab Results  Component Value Date   WBC 6.6 07/13/2017   HGB 10.6 (L) 07/13/2017   HCT 34.9 07/13/2017   MCV 88.4 07/13/2017   PLT 296 07/13/2017      Chemistry  Component Value Date/Time   NA 138 07/13/2017 0818   NA 143 03/15/2017 1436   K 3.9 07/13/2017 0818   K 3.7 03/15/2017 1436   CL 103 07/13/2017 0818   CO2 28 07/13/2017 0818   CO2 34 (H) 03/15/2017 1436   BUN 9 07/13/2017 0818   BUN 17.8 03/15/2017 1436   CREATININE 0.68 07/13/2017 0818   CREATININE 0.8 03/15/2017 1436      Component Value Date/Time   CALCIUM 8.9 07/13/2017 0818   CALCIUM 9.6 03/15/2017 1436   ALKPHOS 79 07/13/2017 0818   ALKPHOS 88 03/15/2017 1436   AST 17 07/13/2017 0818   AST 16 03/15/2017 1436   ALT 8 07/13/2017 0818   ALT 15 03/15/2017 1436   BILITOT 0.5 07/13/2017 0818   BILITOT 0.52 03/15/2017 1436       RADIOGRAPHIC STUDIES:  Ct Chest W Contrast  Result Date:  06/27/2017 CLINICAL DATA:  Known right hilar non-small-cell lung cancer, hypertension, dehydration, decreased breath sounds, pleural effusion EXAM: CT CHEST WITH CONTRAST TECHNIQUE: Multidetector CT imaging of the chest was performed during intravenous contrast administration. CONTRAST:  14mL OMNIPAQUE IOHEXOL 300 MG/ML  SOLN COMPARISON:  06/27/2017 chest x-ray, 03/12/2017 PET-CT, 01/31/2017 chest CT FINDINGS: Cardiovascular: Atherosclerosis of the major branch vessels and aorta without aneurysm, dissection, mediastinal hemorrhage/hematoma. Central pulmonary arteries appear patent. Native coronary atherosclerosis noted. Normal heart size. No pericardial effusion. Mediastinum/Nodes: Known right hilar/mediastinal lung mass is obscured by the complete right lung collapse/consolidation related to the large right pleural effusion. Small inferior right thyroid nodules noted. Trachea and left central airways are patent. Right mainstem bronchus and bronchus intermedius appear occluded centrally. No significant or bulky adenopathy demonstrated. Lungs/Pleura: Ill-defined right hilar/mediastinal mass again obscured by right lung complete collapse/consolidation and the large effusion on the right. Trace left pleural effusion dependently. Anterior left upper lobe patchy airspace process with central air bronchograms may represent infectious/inflammatory process or pneumonia the left upper lobe. Minor lingula and left basilar atelectasis. Upper Abdomen: Stable bilateral adrenal nodules, previously demonstrated to be adenomas by PET-CT. Abdominal atherosclerosis noted. No acute upper abdominal finding. Musculoskeletal: Degenerative changes noted of the spine. Endplate osteophytes throughout. Chronic appearing compression fracture at T12. No definite focal osseous abnormality. Sternum intact. IMPRESSION: Recurrent large right pleural effusion with complete right lung collapse/consolidation now obscuring the known right  hilar/mediastinal lung mass. Anterior left upper lobe patchy airspace process with central air bronchograms concerning for left upper lobe pneumonia Minor lingula and left lower lobe atelectasis and trace left effusion. Aortic atherosclerosis and native coronary atherosclerosis Aortic aneurysm NOS (ICD10-I71.9). Electronically Signed   By: Jerilynn Mages.  Shick M.D.   On: 06/27/2017 17:13   Ir Guided Niel Hummer W Catheter Placement  Result Date: 06/29/2017 CLINICAL DATA:  Large malignant right pleural effusion EXAM: INSERTION OF TUNNELED PLEURAL DRAINAGE CATHETER THORACENTESIS RIGHT ANESTHESIA/SEDATION: Intravenous Fentanyl and Versed were administered as conscious sedation during continuous monitoring of the patient's level of consciousness and physiological / cardiorespiratory status by the radiology RN, with a total moderate sedation time of 10 minutes. Marland Kitchen MEDICATIONS: Cefazolin 2 g IV. Antibiotic was administered in an appropriate time interval for the procedure. FLUOROSCOPY TIME:  6 seconds; 1 mGy PROCEDURE: The procedure, risks, benefits, and alternatives were explained to the patient. Questions regarding the procedure were encouraged and answered. The patient understands and consents to the procedure. Survey ultrasound of the right chest was performed an appropriate skin entry sites determined and marked. The right chest wall was prepped with chlorhexidine in a sterile  fashion, and a sterile drape was applied covering the operative field. A sterile gown and sterile gloves were used for the procedure. Local anesthesia was provided with 1% Lidocaine. Ultrasound image documentation was performed. Fluoroscopy during the procedure and fluoroscopic spot radiograph confirms appropriate catheter position. After creating a small skin incision, a 19 gauge sheath needle was advanced into the pleural cavity under ultrasound guidance. A guide wire was then advanced under fluoroscopy into the pleural space. Pleural access was dilated  serially and a 16-French peel-away sheath placed. A tunneled cuffed PleurX catheter was placed. This was tunneled from an incision 5 cm inferomedial to the pleural access to the access site. The catheter was advanced through the peel-away sheath. The sheath was then removed. Final catheter positioning was confirmed with a fluoroscopic spot image. The access incision was closed with Dermabond applied to the incision. A Prolene retention suture was applied at the catheter exit site. Large volume 1.5 L thoracentesis was performed through the new catheter, a sample sent for the requested laboratory studies. The patient tolerated the procedure well. COMPLICATIONS: None. FINDINGS: Large right pleural effusion confirmed on ultrasound. The catheter was placed via the right lateral chest wall. Catheter course is towards the apex. Approximately 1.5 liters of pleural fluid was able to be removed after catheter placement. The patient will be brought back in 10 to 14days to remove the temporary exit site retention suture after appropriate in-growth around the subcutaneous cuff of the catheter. IMPRESSION: Placement of permanent, tunneled right pleural drainage catheter via lateral approach. 1.5 liters of pleural fluid was removed today after catheter placement. Electronically Signed   By: Lucrezia Europe M.D.   On: 06/29/2017 17:04   Ir US Guide Bx Asp/drain  Result Date: 06/29/2017 CLINICAL DATA:  Large malignant right pleural effusion EXAM: INSERTION OF TUNNELED PLEURAL DRAINAGE CATHETER THORACENTESIS RIGHT ANESTHESIA/SEDATION: Intravenous Fentanyl and Versed were administered as conscious sedation during continuous monitoring of the patient's level of consciousness and physiological / cardiorespiratory status by the radiology RN, with a total moderate sedation time of 10 minutes. Marland Kitchen MEDICATIONS: Cefazolin 2 g IV. Antibiotic was administered in an appropriate time interval for the procedure. FLUOROSCOPY TIME:  6 seconds; 1 mGy  PROCEDURE: The procedure, risks, benefits, and alternatives were explained to the patient. Questions regarding the procedure were encouraged and answered. The patient understands and consents to the procedure. Survey ultrasound of the right chest was performed an appropriate skin entry sites determined and marked. The right chest wall was prepped with chlorhexidine in a sterile fashion, and a sterile drape was applied covering the operative field. A sterile gown and sterile gloves were used for the procedure. Local anesthesia was provided with 1% Lidocaine. Ultrasound image documentation was performed. Fluoroscopy during the procedure and fluoroscopic spot radiograph confirms appropriate catheter position. After creating a small skin incision, a 19 gauge sheath needle was advanced into the pleural cavity under ultrasound guidance. A guide wire was then advanced under fluoroscopy into the pleural space. Pleural access was dilated serially and a 16-French peel-away sheath placed. A tunneled cuffed PleurX catheter was placed. This was tunneled from an incision 5 cm inferomedial to the pleural access to the access site. The catheter was advanced through the peel-away sheath. The sheath was then removed. Final catheter positioning was confirmed with a fluoroscopic spot image. The access incision was closed with Dermabond applied to the incision. A Prolene retention suture was applied at the catheter exit site. Large volume 1.5 L thoracentesis was performed through  the new catheter, a sample sent for the requested laboratory studies. The patient tolerated the procedure well. COMPLICATIONS: None. FINDINGS: Large right pleural effusion confirmed on ultrasound. The catheter was placed via the right lateral chest wall. Catheter course is towards the apex. Approximately 1.5 liters of pleural fluid was able to be removed after catheter placement. The patient will be brought back in 10 to 14days to remove the temporary exit site  retention suture after appropriate in-growth around the subcutaneous cuff of the catheter. IMPRESSION: Placement of permanent, tunneled right pleural drainage catheter via lateral approach. 1.5 liters of pleural fluid was removed today after catheter placement. Electronically Signed   By: Lucrezia Europe M.D.   On: 06/29/2017 17:04   Dg Chest Port 1 View  Result Date: 07/04/2017 CLINICAL DATA:  Respiratory failure EXAM: PORTABLE CHEST 1 VIEW COMPARISON:  Portable chest x-ray of July 03, 2017 FINDINGS: There remains near total opacification of the right hemithorax. The right chest tube overlies over the posterior third and fourth rib interspace medially and appears to been advanced slightly. There is mild shift of the mediastinum toward the right. The left lung is well-expanded. The interstitial markings are slightly more conspicuous on the left today. The left heart border is less well defined. The pulmonary vascularity is indistinct. There is calcification in the wall of the aortic arch. IMPRESSION: Slight interval increase in the interstitial markings in the left lung when compared to yesterday's study. This may reflect slightly increased pulmonary edema. Stable open near-total opacification of the right hemithorax. Electronically Signed   By: David  Martinique M.D.   On: 07/04/2017 10:33   Dg Chest Port 1 View  Result Date: 07/03/2017 CLINICAL DATA:  Pleural effusion, shortness of Breath EXAM: PORTABLE CHEST 1 VIEW COMPARISON:  07/02/2017 FINDINGS: Left PleurX catheter noted in place. Complete opacification of the right hemithorax, stable. Mild perihilar and lower lobe opacities on the left, stable. Heart is normal size. No acute bony abnormality. IMPRESSION: Stable complete whiteout of the right hemithorax with PleurX catheter in place. Stable left perihilar and lower lobe airspace opacities. Electronically Signed   By: Rolm Baptise M.D.   On: 07/03/2017 09:02   Dg Chest Port 1 View  Result Date:  07/02/2017 CLINICAL DATA:  Follow-up right pleural effusion. EXAM: PORTABLE CHEST 1 VIEW COMPARISON:  Portable chest x-ray of July 01, 2017. FINDINGS: The left lung is well-expanded. There is mild shift of mediastinum toward the right which is stable. There is opacification of nearly all of the right hemithorax but a small amount of aerated right upper lung is present today. The right pleural drainage catheter tip projects over the medial aspect of the fourth and fifth posterior rib interspace. The left heart border is normal. The central pulmonary vascularity is mildly prominent. There is no left pleural effusion. IMPRESSION: Slight interval improvement in the appearance of the right hemithorax with a small amount of aeration of the right upper lobe. Stable appearance of the right pleural drainage catheter. Electronically Signed   By: David  Martinique M.D.   On: 07/02/2017 08:24   Dg Chest Port 1 View  Result Date: 07/01/2017 CLINICAL DATA:  Shortness of breath. Status post pleural drainage catheter 06/29/2017. History of lung cancer. EXAM: PORTABLE CHEST 1 VIEW COMPARISON:  Single-view of the chest 06/30/2017 and 06/27/2017. CT chest 06/27/2017. FINDINGS: Pleural drainage catheter is again seen. There is complete whiteout of the right chest consistent with the presence of a large effusion. Left lung is emphysematous with mild  basilar atelectasis. Cardiac silhouette is largely obscured. No pneumothorax. IMPRESSION: No change in complete whiteout of the right chest consistent with pleural effusion and airspace disease. Pleural drainage catheter is in place. No new abnormality. Electronically Signed   By: Inge Rise M.D.   On: 07/01/2017 10:58   Dg Chest Port 1 View  Result Date: 06/30/2017 CLINICAL DATA:  Right pleural effusion. EXAM: PORTABLE CHEST 1 VIEW COMPARISON:  06/30/2017 FINDINGS: Continued complete opacification of the right hemithorax with right PleurX catheter remaining in place. No real  change. No confluent opacity on the left. Heart is likely normal size. IMPRESSION: No significant change in the white out of the right lung. Electronically Signed   By: Rolm Baptise M.D.   On: 06/30/2017 18:56   Dg Chest Port 1 View  Result Date: 06/30/2017 CLINICAL DATA:  Shortness of breath today. Status post pleural drainage catheter placement 06/29/2017. Lung cancer. EXAM: PORTABLE CHEST 1 VIEW COMPARISON:  Single-view of the chest and CT chest 06/27/2017. FINDINGS: Pleural drainage catheter on the right is identified. There is complete whiteout of the right chest which is unchanged. No focal airspace disease or effusion on the left. No pneumothorax. Cardiac silhouette is largely obscured. IMPRESSION: Persistent complete whiteout of the right chest consistent with a large effusion and airspace disease. Pleural drainage catheter is in place on the right. Electronically Signed   By: Inge Rise M.D.   On: 06/30/2017 12:11   Dg Chest Port 1 View  Result Date: 06/27/2017 CLINICAL DATA:  77 year old female with metastatic non-small cell lung cancer. Shortness of breath for 2 days and diarrhea. Dehydrated, tachycardia. EXAM: PORTABLE CHEST 1 VIEW COMPARISON:  05/17/2017 radiographs and earlier. FINDINGS: Portable AP upright view at 1253 hours. New virtually complete whiteout of the right hemithorax. Minimal aerated lung suspected about the right hilum. Abrupt cut off of the right mainstem bronchus. No definite mediastinal shift since March. Stable left lung. Stable left mediastinal contours. No acute osseous abnormality identified. IMPRESSION: 1. New nonspecific white out of the right hemithorax. This could reflect a combination of collapse, consolidation, and pleural effusion. Chest CT (IV contrast preferred) would best characterize further. 2. Stable left lung. Electronically Signed   By: Genevie Ann M.D.   On: 06/27/2017 13:27     ASSESSMENT/PLAN:  Malignant neoplasm of posterior mediastinum  [facility]) This is a very pleasant 77 year old white female recently diagnosed with probably stage IV non-small cell lung cancer presented with large right suprahilar lung mass in addition to mediastinal lymphadenopathy and suspicious malignant right pleural effusion.  The patient received systemic chemotherapy with carboplatin, paclitaxel and Keytruda status post 3 cycles.  She was hospitalized for recurrent right pleural effusion status post Pleurx catheter and diarrhea thought to be due to C. Difficile. The patient is here for evaluation prior to starting single agent Keytruda.  The patient was seen with Dr. Julien Nordmann.  Treatment options were reviewed with the patient and her daughters.  Prognosis was discussed.  We discussed proceeding with single agent Keytruda versus a referral to palliative care/hospice.  The patient does not want to proceed with any treatment today due to her diarrhea and fatigue.  She plans to meet with hospice this afternoon to hear about their services.  We discussed with the patient that she would not be able to proceed with any additional Keytruda if she opts for hospice.  We will plan to bring the patient back as previously scheduled on 07/24/2017 to further discuss her treatment options.  She is tentatively been scheduled for cycle of Keytruda on that date.  For her oral candidiasis, a prescription for nystatin 5 cc 4 times a day x10 days was sent to the skilled nursing facility.  For hypotension, we discussed maintaining adequate hydration and nutrition.  Recommend for the attending physician at the facility to review the patient's medications and give parameters for holding metoprolol.  She was advised to call immediately if she has any concerning symptoms in the interval. The patient voices understanding of current disease status and treatment options and is in agreement with the current care plan.  All questions were answered. The patient knows to call the clinic with any  problems, questions or concerns. We can certainly see the patient much sooner if necessary.   No orders of the defined types were placed in this encounter.  Mikey Bussing, DNP, AGPCNP-BC, AOCNP 07/13/17  ADDENDUM: Hematology/Oncology Attending: I had a face-to-face encounter with the patient.  I recommended her care plan.  This is a very pleasant 77 years old white female with a stage IV non-small cell lung cancer, squamous cell carcinoma status post systemic chemotherapy with carboplatin, paclitaxel and Keytruda for 3 cycles.  She has been tolerating this treatment well except for fatigue.  The patient was admitted to [facility] complaining of diarrhea and she was treated for suspicious C. Difficile.  During her hospitalization repeat CT scan of the chest showed large right pleural effusion and the patient underwent ultrasound-guided right thoracentesis and the cytology from the pleural fluid with positive for malignancy.  The scan showed no concerning findings for disease progression but her disease was also obscured by the large pleural effusion. The patient is recovering slowly from her recent hospitalization and she does not think she is good enough to resume treatment.  I had a lengthy discussion with the patient and her family about her current condition and treatment options.  I recommended for the patient to delay the start of her treatment by at least 2 more weeks until she gains more strength and feels better.  We will see her back for follow-up visit in 2 weeks for reevaluation and I may resume her treatment with single agent Keytruda and we will discontinue the carboplatin and paclitaxel at this point because of the intolerance. The patient and her family agreed to the current plan. She was advised to call immediately if she has any concerning symptoms in the interval.  Disclaimer: This note was dictated with voice recognition software. Similar sounding words can inadvertently  be transcribed and may be missed upon review. [name], MD 07/14/17

## 2017-07-13 NOTE — Assessment & Plan Note (Signed)
This is a very pleasant 77 year old white female recently diagnosed with probably stage IV non-small cell lung cancer presented with large right suprahilar lung mass in addition to mediastinal lymphadenopathy and suspicious malignant right pleural effusion.  The patient received systemic chemotherapy with carboplatin, paclitaxel and Keytruda status post 3 cycles.  She was hospitalized for recurrent right pleural effusion status post Pleurx catheter and diarrhea thought to be due to C. Difficile. The patient is here for evaluation prior to starting single agent Keytruda.  The patient was seen with Dr. Julien Nordmann.  Treatment options were reviewed with the patient and her daughters.  Prognosis was discussed.  We discussed proceeding with single agent Keytruda versus a referral to palliative care/hospice.  The patient does not want to proceed with any treatment today due to her diarrhea and fatigue.  She plans to meet with hospice this afternoon to hear about their services.  We discussed with the patient that she would not be able to proceed with any additional Keytruda if she opts for hospice.  We will plan to bring the patient back as previously scheduled on 07/24/2017 to further discuss her treatment options.  She is tentatively been scheduled for cycle of Keytruda on that date.  For her oral candidiasis, a prescription for nystatin 5 cc 4 times a day x10 days was sent to the skilled nursing facility.  For hypotension, we discussed maintaining adequate hydration and nutrition.  Recommend for the attending physician at the facility to review the patient's medications and give parameters for holding metoprolol.  She was advised to call immediately if she has any concerning symptoms in the interval. The patient voices understanding of current disease status and treatment options and is in agreement with the current care plan.  All questions were answered. The patient knows to call the clinic with any  problems, questions or concerns. We can certainly see the patient much sooner if necessary.

## 2017-07-18 ENCOUNTER — Other Ambulatory Visit: Payer: Medicare Other

## 2017-07-19 DIAGNOSIS — F419 Anxiety disorder, unspecified: Secondary | ICD-10-CM | POA: Diagnosis not present

## 2017-07-19 DIAGNOSIS — J9611 Chronic respiratory failure with hypoxia: Secondary | ICD-10-CM | POA: Diagnosis not present

## 2017-07-19 DIAGNOSIS — C382 Malignant neoplasm of posterior mediastinum: Secondary | ICD-10-CM | POA: Diagnosis not present

## 2017-07-19 DIAGNOSIS — F329 Major depressive disorder, single episode, unspecified: Secondary | ICD-10-CM | POA: Diagnosis not present

## 2017-07-19 DIAGNOSIS — Z438 Encounter for attention to other artificial openings: Secondary | ICD-10-CM | POA: Diagnosis not present

## 2017-07-19 DIAGNOSIS — I871 Compression of vein: Secondary | ICD-10-CM | POA: Diagnosis not present

## 2017-07-19 DIAGNOSIS — E43 Unspecified severe protein-calorie malnutrition: Secondary | ICD-10-CM | POA: Diagnosis not present

## 2017-07-19 DIAGNOSIS — Z87891 Personal history of nicotine dependence: Secondary | ICD-10-CM | POA: Diagnosis not present

## 2017-07-19 DIAGNOSIS — Z9981 Dependence on supplemental oxygen: Secondary | ICD-10-CM | POA: Diagnosis not present

## 2017-07-19 DIAGNOSIS — J910 Malignant pleural effusion: Secondary | ICD-10-CM | POA: Diagnosis not present

## 2017-07-19 DIAGNOSIS — D638 Anemia in other chronic diseases classified elsewhere: Secondary | ICD-10-CM | POA: Diagnosis not present

## 2017-07-19 DIAGNOSIS — I480 Paroxysmal atrial fibrillation: Secondary | ICD-10-CM | POA: Diagnosis not present

## 2017-07-20 DIAGNOSIS — Z438 Encounter for attention to other artificial openings: Secondary | ICD-10-CM | POA: Diagnosis not present

## 2017-07-20 DIAGNOSIS — D638 Anemia in other chronic diseases classified elsewhere: Secondary | ICD-10-CM | POA: Diagnosis not present

## 2017-07-20 DIAGNOSIS — J910 Malignant pleural effusion: Secondary | ICD-10-CM | POA: Diagnosis not present

## 2017-07-20 DIAGNOSIS — J9611 Chronic respiratory failure with hypoxia: Secondary | ICD-10-CM | POA: Diagnosis not present

## 2017-07-20 DIAGNOSIS — C382 Malignant neoplasm of posterior mediastinum: Secondary | ICD-10-CM | POA: Diagnosis not present

## 2017-07-20 DIAGNOSIS — Z9981 Dependence on supplemental oxygen: Secondary | ICD-10-CM | POA: Diagnosis not present

## 2017-07-21 DIAGNOSIS — J910 Malignant pleural effusion: Secondary | ICD-10-CM | POA: Diagnosis not present

## 2017-07-21 DIAGNOSIS — J9611 Chronic respiratory failure with hypoxia: Secondary | ICD-10-CM | POA: Diagnosis not present

## 2017-07-21 DIAGNOSIS — D638 Anemia in other chronic diseases classified elsewhere: Secondary | ICD-10-CM | POA: Diagnosis not present

## 2017-07-21 DIAGNOSIS — Z9981 Dependence on supplemental oxygen: Secondary | ICD-10-CM | POA: Diagnosis not present

## 2017-07-21 DIAGNOSIS — Z438 Encounter for attention to other artificial openings: Secondary | ICD-10-CM | POA: Diagnosis not present

## 2017-07-21 DIAGNOSIS — C382 Malignant neoplasm of posterior mediastinum: Secondary | ICD-10-CM | POA: Diagnosis not present

## 2017-07-22 DIAGNOSIS — J9611 Chronic respiratory failure with hypoxia: Secondary | ICD-10-CM | POA: Diagnosis not present

## 2017-07-22 DIAGNOSIS — Z438 Encounter for attention to other artificial openings: Secondary | ICD-10-CM | POA: Diagnosis not present

## 2017-07-22 DIAGNOSIS — D638 Anemia in other chronic diseases classified elsewhere: Secondary | ICD-10-CM | POA: Diagnosis not present

## 2017-07-22 DIAGNOSIS — Z9981 Dependence on supplemental oxygen: Secondary | ICD-10-CM | POA: Diagnosis not present

## 2017-07-22 DIAGNOSIS — J910 Malignant pleural effusion: Secondary | ICD-10-CM | POA: Diagnosis not present

## 2017-07-22 DIAGNOSIS — C382 Malignant neoplasm of posterior mediastinum: Secondary | ICD-10-CM | POA: Diagnosis not present

## 2017-07-23 DIAGNOSIS — Z438 Encounter for attention to other artificial openings: Secondary | ICD-10-CM | POA: Diagnosis not present

## 2017-07-23 DIAGNOSIS — Z9981 Dependence on supplemental oxygen: Secondary | ICD-10-CM | POA: Diagnosis not present

## 2017-07-23 DIAGNOSIS — J910 Malignant pleural effusion: Secondary | ICD-10-CM | POA: Diagnosis not present

## 2017-07-23 DIAGNOSIS — D638 Anemia in other chronic diseases classified elsewhere: Secondary | ICD-10-CM | POA: Diagnosis not present

## 2017-07-23 DIAGNOSIS — C382 Malignant neoplasm of posterior mediastinum: Secondary | ICD-10-CM | POA: Diagnosis not present

## 2017-07-23 DIAGNOSIS — J9611 Chronic respiratory failure with hypoxia: Secondary | ICD-10-CM | POA: Diagnosis not present

## 2017-07-24 ENCOUNTER — Telehealth: Payer: Self-pay

## 2017-07-24 ENCOUNTER — Inpatient Hospital Stay: Payer: Medicare Other

## 2017-07-24 ENCOUNTER — Inpatient Hospital Stay (HOSPITAL_BASED_OUTPATIENT_CLINIC_OR_DEPARTMENT_OTHER): Payer: Medicare Other | Admitting: Internal Medicine

## 2017-07-24 ENCOUNTER — Telehealth: Payer: Self-pay | Admitting: Medical Oncology

## 2017-07-24 ENCOUNTER — Other Ambulatory Visit: Payer: Self-pay | Admitting: Medical Oncology

## 2017-07-24 ENCOUNTER — Encounter: Payer: Self-pay | Admitting: Internal Medicine

## 2017-07-24 VITALS — BP 127/71 | HR 97 | Temp 98.0°F | Resp 18 | Ht 69.0 in | Wt 158.4 lb

## 2017-07-24 DIAGNOSIS — Z5112 Encounter for antineoplastic immunotherapy: Secondary | ICD-10-CM

## 2017-07-24 DIAGNOSIS — J910 Malignant pleural effusion: Secondary | ICD-10-CM

## 2017-07-24 DIAGNOSIS — C382 Malignant neoplasm of posterior mediastinum: Secondary | ICD-10-CM

## 2017-07-24 DIAGNOSIS — I959 Hypotension, unspecified: Secondary | ICD-10-CM | POA: Diagnosis not present

## 2017-07-24 DIAGNOSIS — Z7189 Other specified counseling: Secondary | ICD-10-CM

## 2017-07-24 DIAGNOSIS — C778 Secondary and unspecified malignant neoplasm of lymph nodes of multiple regions: Secondary | ICD-10-CM

## 2017-07-24 DIAGNOSIS — R5382 Chronic fatigue, unspecified: Secondary | ICD-10-CM

## 2017-07-24 DIAGNOSIS — I10 Essential (primary) hypertension: Secondary | ICD-10-CM

## 2017-07-24 DIAGNOSIS — I4891 Unspecified atrial fibrillation: Secondary | ICD-10-CM | POA: Diagnosis not present

## 2017-07-24 DIAGNOSIS — C3491 Malignant neoplasm of unspecified part of right bronchus or lung: Secondary | ICD-10-CM

## 2017-07-24 DIAGNOSIS — J90 Pleural effusion, not elsewhere classified: Secondary | ICD-10-CM

## 2017-07-24 LAB — CBC WITH DIFFERENTIAL (CANCER CENTER ONLY)
BASOS PCT: 0 %
Basophils Absolute: 0 10*3/uL (ref 0.0–0.1)
EOS ABS: 0.1 10*3/uL (ref 0.0–0.5)
Eosinophils Relative: 2 %
HCT: 34.7 % — ABNORMAL LOW (ref 34.8–46.6)
Hemoglobin: 10.4 g/dL — ABNORMAL LOW (ref 11.6–15.9)
Lymphocytes Relative: 13 %
Lymphs Abs: 0.9 10*3/uL (ref 0.9–3.3)
MCH: 26.9 pg (ref 25.1–34.0)
MCHC: 30 g/dL — AB (ref 31.5–36.0)
MCV: 89.9 fL (ref 79.5–101.0)
MONOS PCT: 11 %
Monocytes Absolute: 0.8 10*3/uL (ref 0.1–0.9)
NEUTROS PCT: 74 %
Neutro Abs: 5.1 10*3/uL (ref 1.5–6.5)
Platelet Count: 330 10*3/uL (ref 145–400)
RBC: 3.86 MIL/uL (ref 3.70–5.45)
RDW: 20.5 % — AB (ref 11.2–14.5)
WBC Count: 6.9 10*3/uL (ref 3.9–10.3)

## 2017-07-24 LAB — CMP (CANCER CENTER ONLY)
ALT: 17 U/L (ref 0–55)
ANION GAP: 7 (ref 3–11)
AST: 34 U/L (ref 5–34)
Albumin: 2.3 g/dL — ABNORMAL LOW (ref 3.5–5.0)
Alkaline Phosphatase: 111 U/L (ref 40–150)
BUN: 11 mg/dL (ref 7–26)
CO2: 31 mmol/L — AB (ref 22–29)
Calcium: 9.1 mg/dL (ref 8.4–10.4)
Chloride: 102 mmol/L (ref 98–109)
Creatinine: 0.67 mg/dL (ref 0.60–1.10)
GFR, Estimated: >60 mL/min (ref >60)
Glucose, Bld: 119 mg/dL (ref 70–140)
POTASSIUM: 4.2 mmol/L (ref 3.5–5.1)
SODIUM: 140 mmol/L (ref 136–145)
Total Bilirubin: 0.2 mg/dL (ref 0.2–1.2)
Total Protein: 6.5 g/dL (ref 6.4–8.3)

## 2017-07-24 LAB — TSH: TSH: 3.349 u[IU]/mL (ref 0.308–3.960)

## 2017-07-24 MED ORDER — SODIUM CHLORIDE 0.9 % IV SOLN
Freq: Once | INTRAVENOUS | Status: AC
  Administered 2017-07-24: 16:00:00 via INTRAVENOUS

## 2017-07-24 MED ORDER — SODIUM CHLORIDE 0.9 % IV SOLN
200.0000 mg | Freq: Once | INTRAVENOUS | Status: AC
  Administered 2017-07-24: 200 mg via INTRAVENOUS
  Filled 2017-07-24: qty 8

## 2017-07-24 NOTE — Telephone Encounter (Signed)
Triage RN received call from Jannet Mantis with [facility]. Cristy Friedlander states that because patient has chosen to begin chemotherapy treatment, [facility] will be picking up patient's Oxygen and Nebulizer equipment. In order for patient to continue with home oxygen and nebulizer, [facility] needs an order, an office note showing need, and insurance information. Patient currently on 4L continuous O2 and Albuterol 2.5mg /26mL Q4H PRN  for wheezing and cough per Manuela Schwartz. Please follow-up.

## 2017-07-24 NOTE — Telephone Encounter (Signed)
Printed avs and calender of upcoming appointment per orders 5/14 los

## 2017-07-24 NOTE — Progress Notes (Signed)
Cardiology Office Note   Date:  07/25/2017   ID:  Cassidy Sanders, DOB September 09, 1940, MRN [id number]  PCP:  [name], MD  Cardiologist:  Dr. Johnsie Cancel     Chief Complaint  Patient presents with  . Atrial Fibrillation    during hospitlaization      History of Present Illness: Cassidy Sanders is a 77 y.o. female who presents for post hospitalization for a fib after admit for diarrhea -C. Diff, and recent hx of metastatic non small cell carcinoma, lung cancer.  She had hx of malignant Rt effusion.  And had pleur X catheter.  She went into a fib 06/30/17 -no chest pain.  Her CHA2DS2VASc score was 4-5.   She was started on IV dilt and amiodarone.   She did convert to SR.     She was discharged on BB and Eliquis.  She will need to be monitored for bleeding.  2D echo with normal LVF EF 55-60% with G2DD, moderate PHTN and no pericardial effusion  D/c'd 07/06/17   Today she is not sure when she came off Eliquis.  They may have tossed script  when discharged from rehab.  She has no bleeding.   No chest pain and no SOB.  No lightheadedness.  She has completed radiation.  She still has pleurx catheter, now draining very little per pt.  Hospice follow at home.     Past Medical History:  Diagnosis Date  . Brain tumor (benign) ([place])   . Depression   . Hypertension     Past Surgical History:  Procedure Laterality Date  . ABDOMINAL HYSTERECTOMY    . BLADDER SURGERY    . BRAIN SURGERY    . ENDOBRONCHIAL ULTRASOUND Bilateral 02/06/2017   Procedure: ENDOBRONCHIAL ULTRASOUND;  Surgeon: Juanito Doom, MD;  Location: [facility];  Service: Cardiopulmonary;  Laterality: Bilateral;  . IR GUIDED DRAIN W CATHETER PLACEMENT  06/29/2017  . IR US GUIDE BX ASP/DRAIN  06/29/2017  . VIDEO BRONCHOSCOPY WITH ENDOBRONCHIAL ULTRASOUND N/A 03/23/2017   Procedure: VIDEO BRONCHOSCOPY WITH ENDOBRONCHIAL ULTRASOUND;  Surgeon: Juanito Doom, MD;  Location: [facility] OR;  Service: Thoracic;  Laterality: N/A;      Current Outpatient Medications  Medication Sig Dispense Refill  . Biotin 2.5 MG TABS Take 2,500 mcg by mouth daily.    . diphenhydrAMINE (BENADRYL) 25 mg capsule Take 1 capsule (25 mg total) by mouth at bedtime as needed for sleep. 30 capsule 0  . feeding supplement, ENSURE ENLIVE, (ENSURE ENLIVE) LIQD Take 237 mLs by mouth 2 (two) times daily as needed (If patient or family requests, if pt unable to eat for a meal, or if intakes are poor). 237 mL 12  . levalbuterol (XOPENEX) 0.63 MG/3ML nebulizer solution Take 3 mLs (0.63 mg total) by nebulization every 6 (six) hours as needed for wheezing or shortness of breath. 3 mL 12  . loperamide (IMODIUM) 2 MG capsule Take 2 mg by mouth as needed for diarrhea or loose stools.    . metoprolol tartrate (LOPRESSOR) 50 MG tablet Take 1 tablet (50 mg total) by mouth 2 (two) times daily. 60 tablet 0  . nystatin (MYCOSTATIN) 100000 UNIT/ML suspension Take 5 mLs (500,000 Units total) by mouth 4 (four) times daily. For 10 days. 200 mL 0  . sertraline (ZOLOFT) 50 MG tablet Take 50 mg by mouth daily.     No current facility-administered medications for this visit.     Allergies:   Latex; Gabapentin; and Tape    Social  History:  The patient  reports that she quit smoking about 5 years ago. Her smoking use included cigarettes. She has a 59.00 pack-year smoking history. She has never used smokeless tobacco. She reports that she drinks alcohol. She reports that she does not use drugs.   Family History:  The patient's Family history is unknown by patient.    ROS:  General:no colds or fevers, no weight changes Skin:no rashes or ulcers HEENT:no blurred vision, no congestion CV:see HPI PUL:see HPI GI:no diarrhea constipation or melena, no indigestion GU:no hematuria, no dysuria MS:no joint pain, no claudication Neuro:no syncope, no lightheadedness Endo:no diabetes, no thyroid disease  Wt Readings from Last 3 Encounters:  07/25/17 150 lb (68 kg)   07/24/17 158 lb 6.4 oz (71.8 kg)  07/04/17 175 lb 7.8 oz (79.6 kg)     PHYSICAL EXAM: VS:  BP 104/76   Pulse 94   Ht 5\' 9"  (1.753 m)   Wt 150 lb (68 kg)   SpO2 97% Comment: on oxygen, continuous  BMI 22.15 kg/m  , BMI Body mass index is 22.15 kg/m. General:Pleasant affect, NAD, no hair Skin:Warm and dry, brisk capillary refill HEENT:normocephalic, sclera clear, mucus membranes moist Neck:supple, no JVD, no bruits  Heart:S1S2 RRR without murmur, gallup, rub or click, pleurx cath site dressed Lungs:clear without rales, rhonchi, or wheezes ZSW:FUXN, non tender, + BS, do not palpate liver spleen or masses Ext:no lower ext edema, 2+ pedal pulses, 2+ radial pulses Neuro:alert and oriented X 3, MAE, follows commands, + facial symmetry    EKG:  EKG is ordered today. The ekg ordered today demonstrates SR Q waves in III no changes from last EKG at hospital.     Recent Labs: 07/04/2017: Magnesium 1.8 07/24/2017: ALT 17; BUN 11; Creatinine 0.67; Hemoglobin 10.4; Platelet Count 330; Potassium 4.2; Sodium 140; TSH 3.349    Lipid Panel    Component Value Date/Time   TRIG 111 02/06/2017 0910       Other studies Reviewed: Additional studies/ records that were reviewed today include: . Echo 07/01/17  Study Conclusions  - Left ventricle: The cavity size was normal. Wall thickness was   increased in a pattern of moderate LVH. Systolic function was   normal. The estimated ejection fraction was in the range of 55%   to 60%. Features are consistent with a pseudonormal left   ventricular filling pattern, with concomitant abnormal relaxation   and increased filling pressure (grade 2 diastolic dysfunction).   Doppler parameters are consistent with high ventricular filling   pressure. - Aortic valve: Mildly to moderately calcified annulus. Trileaflet;   moderately thickened leaflets. Valve area (VTI): 2.29 cm^2. Valve   area (Vmax): 2.01 cm^2. Valve area (Vmean): 2.06 cm^2. - Mitral  valve: Mildly calcified annulus. Mildly thickened leaflets   . - Left atrium: The atrium was severely dilated. - Right ventricle: The cavity size was mildly dilated. Wall   thickness was normal. - Right atrium: The atrium was mildly dilated. - Pulmonary arteries: Systolic pressure was moderately increased.   PA peak pressure: 45 mm Hg (S). - Technically difficult study.  ASSESSMENT AND PLAN:  1.  PAF, maintaining SR - she has no symptoms of a fib.  Follow up in 3 months with Dr. Johnsie Cancel.   2.  Anticoagulation is off Eliquis so will resume.  Lost script.  Given samples and script  Sent to pharmacy. Labs checked freq through oncology  3.  Rt pl effusion improving followed by Hospice  4.  HTN  now on low end, off meds except lopressor.    5.  Metastatic non small cell carcinoma followed by oncology.    Current medicines are reviewed with the patient today.  The patient Has no concerns regarding medicines.  The following changes have been made:  See above Labs/ tests ordered today include:see above  Disposition:   FU:  see above  Signed, Cecilie Kicks, NP  07/25/2017 11:39 AM    [facility], [place], [place], [place] Phone: [phone]; Fax: [phone]

## 2017-07-24 NOTE — Progress Notes (Signed)
[place] [phone]   [phone]  OFFICE PROGRESS NOTE  [name], MD [address] [place] [address]  DIAGNOSIS: Metastatic non-small cell carcinoma (T3, N2, M1a) non-small cell lung cancer diagnosed in November 2018 and presented with large right suprahilar and mediastinal mass as well as recent right pleural effusion.  Guardant 360: No actionable mutations.  PRIOR THERAPY:  1) Palliative radiotherapy to the large mediastinal mass under the care of Dr. Lisbeth Renshaw. 2) Systemic chemotherapy with carboplatin for AUC of 5, paclitaxel 175 mg/M2 and Keytruda 200 mg IV every 3 weeks.  First dose expected May 01, 2017.  Status post 3 cycles.  CURRENT THERAPY: Maintenance treatment with immunotherapy with single agent Keytruda 200 mg IV every 3 weeks.  First cycle 07/24/2017.  INTERVAL HISTORY: Cassidy Sanders 77 y.o. female returns to the clinic today for follow-up visit accompanied by her daughter.  The patient is feeling much better today with no specific complaints.  She denied having any chest pain but has shortness of breath with exertion with no cough or hemoptysis.  She continues to have drainage of the right pleural effusion by the palliative care and hospice of [facility].  She denied having any current fever or chills.  She has no nausea, vomiting, diarrhea or constipation.  The patient denied having any significant weight loss or night sweats.  She is here today for reevaluation and discussion of her treatment options she was enrolled recently at the palliative care and hospice of [facility].  MEDICAL HISTORY: Past Medical History:  Diagnosis Date  . Brain tumor (benign) ([facility])   . Depression   . Hypertension     ALLERGIES:  is allergic to latex; gabapentin; and tape.  MEDICATIONS:  Current Outpatient Medications  Medication Sig Dispense Refill  . apixaban (ELIQUIS) 5 MG TABS tablet Take 1 tablet (5 mg total) by mouth 2 (two)  times daily. 60 tablet 0  . Biotin 2.5 MG TABS Take 2,500 mcg by mouth daily.    . diphenhydrAMINE (BENADRYL) 25 mg capsule Take 1 capsule (25 mg total) by mouth at bedtime as needed for sleep. 30 capsule 0  . feeding supplement, ENSURE ENLIVE, (ENSURE ENLIVE) LIQD Take 237 mLs by mouth 2 (two) times daily as needed (If patient or family requests, if pt unable to eat for a meal, or if intakes are poor). 237 mL 12  . levalbuterol (XOPENEX) 0.63 MG/3ML nebulizer solution Take 3 mLs (0.63 mg total) by nebulization every 6 (six) hours as needed for wheezing or shortness of breath. (Patient not taking: Reported on 07/13/2017) 3 mL 12  . loperamide (IMODIUM) 2 MG capsule Take 2 mg by mouth as needed for diarrhea or loose stools.    . metoprolol tartrate (LOPRESSOR) 50 MG tablet Take 1 tablet (50 mg total) by mouth 2 (two) times daily. 60 tablet 0  . nystatin (MYCOSTATIN) 100000 UNIT/ML suspension Take 5 mLs (500,000 Units total) by mouth 4 (four) times daily. For 10 days. 200 mL 0  . sertraline (ZOLOFT) 50 MG tablet Take 50 mg by mouth daily.     No current facility-administered medications for this visit.     SURGICAL HISTORY:  Past Surgical History:  Procedure Laterality Date  . ABDOMINAL HYSTERECTOMY    . BLADDER SURGERY    . BRAIN SURGERY    . ENDOBRONCHIAL ULTRASOUND Bilateral 02/06/2017   Procedure: ENDOBRONCHIAL ULTRASOUND;  Surgeon: Juanito Doom, MD;  Location: [facility];  Service:  Cardiopulmonary;  Laterality: Bilateral;  . IR GUIDED DRAIN W CATHETER PLACEMENT  06/29/2017  . IR US GUIDE BX ASP/DRAIN  06/29/2017  . VIDEO BRONCHOSCOPY WITH ENDOBRONCHIAL ULTRASOUND N/A 03/23/2017   Procedure: VIDEO BRONCHOSCOPY WITH ENDOBRONCHIAL ULTRASOUND;  Surgeon: Juanito Doom, MD;  Location: [facility];  Service: Thoracic;  Laterality: N/A;    REVIEW OF SYSTEMS:  Constitutional: positive for fatigue Eyes: negative Ears, nose, mouth, throat, and face: negative Respiratory: positive for dyspnea  on exertion Cardiovascular: negative Gastrointestinal: negative Genitourinary:negative Integument/breast: negative Hematologic/lymphatic: negative Musculoskeletal:negative Neurological: negative Behavioral/Psych: negative Endocrine: negative Allergic/Immunologic: negative   PHYSICAL EXAMINATION: General appearance: alert, cooperative, fatigued and no distress Head: Normocephalic, without obvious abnormality, atraumatic Neck: no adenopathy, no JVD, supple, symmetrical, trachea midline and thyroid not enlarged, symmetric, no tenderness/mass/nodules Lymph nodes: Cervical, supraclavicular, and axillary nodes normal. Resp: clear to auscultation bilaterally Back: symmetric, no curvature. ROM normal. No CVA tenderness. Cardio: regular rate and rhythm, S1, S2 normal, no murmur, click, rub or gallop GI: soft, non-tender; bowel sounds normal; no masses,  no organomegaly Extremities: extremities normal, atraumatic, no cyanosis or edema Neurologic: Alert and oriented X 3, normal strength and tone. Normal symmetric reflexes. Normal coordination and gait  ECOG PERFORMANCE STATUS: 1 - Symptomatic but completely ambulatory  Blood pressure 127/71, pulse 97, temperature 98 F (36.7 C), temperature source Oral, resp. rate 18, height 5\' 9"  (1.753 m), weight 158 lb 6.4 oz (71.8 kg), SpO2 92 %.  LABORATORY DATA: Lab Results  Component Value Date   WBC 6.9 07/24/2017   HGB 10.4 (L) 07/24/2017   HCT 34.7 (L) 07/24/2017   MCV 89.9 07/24/2017   PLT 330 07/24/2017      Chemistry      Component Value Date/Time   NA 140 07/24/2017 1323   NA 143 03/15/2017 1436   K 4.2 07/24/2017 1323   K 3.7 03/15/2017 1436   CL 102 07/24/2017 1323   CO2 31 (H) 07/24/2017 1323   CO2 34 (H) 03/15/2017 1436   BUN 11 07/24/2017 1323   BUN 17.8 03/15/2017 1436   CREATININE 0.67 07/24/2017 1323   CREATININE 0.8 03/15/2017 1436      Component Value Date/Time   CALCIUM 9.1 07/24/2017 1323   CALCIUM 9.6 03/15/2017  1436   ALKPHOS 111 07/24/2017 1323   ALKPHOS 88 03/15/2017 1436   AST 34 07/24/2017 1323   AST 16 03/15/2017 1436   ALT 17 07/24/2017 1323   ALT 15 03/15/2017 1436   BILITOT 0.2 07/24/2017 1323   BILITOT 0.52 03/15/2017 1436       RADIOGRAPHIC STUDIES: Ct Chest W Contrast  Result Date: 06/27/2017 CLINICAL DATA:  Known right hilar non-small-cell lung cancer, hypertension, dehydration, decreased breath sounds, pleural effusion EXAM: CT CHEST WITH CONTRAST TECHNIQUE: Multidetector CT imaging of the chest was performed during intravenous contrast administration. CONTRAST:  52mL OMNIPAQUE IOHEXOL 300 MG/ML  SOLN COMPARISON:  06/27/2017 chest x-ray, 03/12/2017 PET-CT, 01/31/2017 chest CT FINDINGS: Cardiovascular: Atherosclerosis of the major branch vessels and aorta without aneurysm, dissection, mediastinal hemorrhage/hematoma. Central pulmonary arteries appear patent. Native coronary atherosclerosis noted. Normal heart size. No pericardial effusion. Mediastinum/Nodes: Known right hilar/mediastinal lung mass is obscured by the complete right lung collapse/consolidation related to the large right pleural effusion. Small inferior right thyroid nodules noted. Trachea and left central airways are patent. Right mainstem bronchus and bronchus intermedius appear occluded centrally. No significant or bulky adenopathy demonstrated. Lungs/Pleura: Ill-defined right hilar/mediastinal mass again obscured by right lung complete collapse/consolidation and the large effusion  on the right. Trace left pleural effusion dependently. Anterior left upper lobe patchy airspace process with central air bronchograms may represent infectious/inflammatory process or pneumonia the left upper lobe. Minor lingula and left basilar atelectasis. Upper Abdomen: Stable bilateral adrenal nodules, previously demonstrated to be adenomas by PET-CT. Abdominal atherosclerosis noted. No acute upper abdominal finding. Musculoskeletal: Degenerative  changes noted of the spine. Endplate osteophytes throughout. Chronic appearing compression fracture at T12. No definite focal osseous abnormality. Sternum intact. IMPRESSION: Recurrent large right pleural effusion with complete right lung collapse/consolidation now obscuring the known right hilar/mediastinal lung mass. Anterior left upper lobe patchy airspace process with central air bronchograms concerning for left upper lobe pneumonia Minor lingula and left lower lobe atelectasis and trace left effusion. Aortic atherosclerosis and native coronary atherosclerosis Aortic aneurysm NOS (ICD10-I71.9). Electronically Signed   By: Jerilynn Mages.  Shick M.D.   On: 06/27/2017 17:13   Ir Guided Niel Hummer W Catheter Placement  Result Date: 06/29/2017 CLINICAL DATA:  Large malignant right pleural effusion EXAM: INSERTION OF TUNNELED PLEURAL DRAINAGE CATHETER THORACENTESIS RIGHT ANESTHESIA/SEDATION: Intravenous Fentanyl and Versed were administered as conscious sedation during continuous monitoring of the patient's level of consciousness and physiological / cardiorespiratory status by the radiology RN, with a total moderate sedation time of 10 minutes. Marland Kitchen MEDICATIONS: Cefazolin 2 g IV. Antibiotic was administered in an appropriate time interval for the procedure. FLUOROSCOPY TIME:  6 seconds; 1 mGy PROCEDURE: The procedure, risks, benefits, and alternatives were explained to the patient. Questions regarding the procedure were encouraged and answered. The patient understands and consents to the procedure. Survey ultrasound of the right chest was performed an appropriate skin entry sites determined and marked. The right chest wall was prepped with chlorhexidine in a sterile fashion, and a sterile drape was applied covering the operative field. A sterile gown and sterile gloves were used for the procedure. Local anesthesia was provided with 1% Lidocaine. Ultrasound image documentation was performed. Fluoroscopy during the procedure and  fluoroscopic spot radiograph confirms appropriate catheter position. After creating a small skin incision, a 19 gauge sheath needle was advanced into the pleural cavity under ultrasound guidance. A guide wire was then advanced under fluoroscopy into the pleural space. Pleural access was dilated serially and a 16-French peel-away sheath placed. A tunneled cuffed PleurX catheter was placed. This was tunneled from an incision 5 cm inferomedial to the pleural access to the access site. The catheter was advanced through the peel-away sheath. The sheath was then removed. Final catheter positioning was confirmed with a fluoroscopic spot image. The access incision was closed with Dermabond applied to the incision. A Prolene retention suture was applied at the catheter exit site. Large volume 1.5 L thoracentesis was performed through the new catheter, a sample sent for the requested laboratory studies. The patient tolerated the procedure well. COMPLICATIONS: None. FINDINGS: Large right pleural effusion confirmed on ultrasound. The catheter was placed via the right lateral chest wall. Catheter course is towards the apex. Approximately 1.5 liters of pleural fluid was able to be removed after catheter placement. The patient will be brought back in 10 to 14days to remove the temporary exit site retention suture after appropriate in-growth around the subcutaneous cuff of the catheter. IMPRESSION: Placement of permanent, tunneled right pleural drainage catheter via lateral approach. 1.5 liters of pleural fluid was removed today after catheter placement. Electronically Signed   By: Lucrezia Europe M.D.   On: 06/29/2017 17:04   Ir US Guide Bx Asp/drain  Result Date: 06/29/2017 CLINICAL DATA:  Large  malignant right pleural effusion EXAM: INSERTION OF TUNNELED PLEURAL DRAINAGE CATHETER THORACENTESIS RIGHT ANESTHESIA/SEDATION: Intravenous Fentanyl and Versed were administered as conscious sedation during continuous monitoring of the  patient's level of consciousness and physiological / cardiorespiratory status by the radiology RN, with a total moderate sedation time of 10 minutes. Marland Kitchen MEDICATIONS: Cefazolin 2 g IV. Antibiotic was administered in an appropriate time interval for the procedure. FLUOROSCOPY TIME:  6 seconds; 1 mGy PROCEDURE: The procedure, risks, benefits, and alternatives were explained to the patient. Questions regarding the procedure were encouraged and answered. The patient understands and consents to the procedure. Survey ultrasound of the right chest was performed an appropriate skin entry sites determined and marked. The right chest wall was prepped with chlorhexidine in a sterile fashion, and a sterile drape was applied covering the operative field. A sterile gown and sterile gloves were used for the procedure. Local anesthesia was provided with 1% Lidocaine. Ultrasound image documentation was performed. Fluoroscopy during the procedure and fluoroscopic spot radiograph confirms appropriate catheter position. After creating a small skin incision, a 19 gauge sheath needle was advanced into the pleural cavity under ultrasound guidance. A guide wire was then advanced under fluoroscopy into the pleural space. Pleural access was dilated serially and a 16-French peel-away sheath placed. A tunneled cuffed PleurX catheter was placed. This was tunneled from an incision 5 cm inferomedial to the pleural access to the access site. The catheter was advanced through the peel-away sheath. The sheath was then removed. Final catheter positioning was confirmed with a fluoroscopic spot image. The access incision was closed with Dermabond applied to the incision. A Prolene retention suture was applied at the catheter exit site. Large volume 1.5 L thoracentesis was performed through the new catheter, a sample sent for the requested laboratory studies. The patient tolerated the procedure well. COMPLICATIONS: None. FINDINGS: Large right pleural  effusion confirmed on ultrasound. The catheter was placed via the right lateral chest wall. Catheter course is towards the apex. Approximately 1.5 liters of pleural fluid was able to be removed after catheter placement. The patient will be brought back in 10 to 14days to remove the temporary exit site retention suture after appropriate in-growth around the subcutaneous cuff of the catheter. IMPRESSION: Placement of permanent, tunneled right pleural drainage catheter via lateral approach. 1.5 liters of pleural fluid was removed today after catheter placement. Electronically Signed   By: Lucrezia Europe M.D.   On: 06/29/2017 17:04   Dg Chest Port 1 View  Result Date: 07/04/2017 CLINICAL DATA:  Respiratory failure EXAM: PORTABLE CHEST 1 VIEW COMPARISON:  Portable chest x-ray of July 03, 2017 FINDINGS: There remains near total opacification of the right hemithorax. The right chest tube overlies over the posterior third and fourth rib interspace medially and appears to been advanced slightly. There is mild shift of the mediastinum toward the right. The left lung is well-expanded. The interstitial markings are slightly more conspicuous on the left today. The left heart border is less well defined. The pulmonary vascularity is indistinct. There is calcification in the wall of the aortic arch. IMPRESSION: Slight interval increase in the interstitial markings in the left lung when compared to yesterday's study. This may reflect slightly increased pulmonary edema. Stable open near-total opacification of the right hemithorax. Electronically Signed   By: David  Martinique M.D.   On: 07/04/2017 10:33   Dg Chest Port 1 View  Result Date: 07/03/2017 CLINICAL DATA:  Pleural effusion, shortness of Breath EXAM: PORTABLE CHEST 1 VIEW COMPARISON:  07/02/2017 FINDINGS: Left PleurX catheter noted in place. Complete opacification of the right hemithorax, stable. Mild perihilar and lower lobe opacities on the left, stable. Heart is normal  size. No acute bony abnormality. IMPRESSION: Stable complete whiteout of the right hemithorax with PleurX catheter in place. Stable left perihilar and lower lobe airspace opacities. Electronically Signed   By: Rolm Baptise M.D.   On: 07/03/2017 09:02   Dg Chest Port 1 View  Result Date: 07/02/2017 CLINICAL DATA:  Follow-up right pleural effusion. EXAM: PORTABLE CHEST 1 VIEW COMPARISON:  Portable chest x-ray of July 01, 2017. FINDINGS: The left lung is well-expanded. There is mild shift of mediastinum toward the right which is stable. There is opacification of nearly all of the right hemithorax but a small amount of aerated right upper lung is present today. The right pleural drainage catheter tip projects over the medial aspect of the fourth and fifth posterior rib interspace. The left heart border is normal. The central pulmonary vascularity is mildly prominent. There is no left pleural effusion. IMPRESSION: Slight interval improvement in the appearance of the right hemithorax with a small amount of aeration of the right upper lobe. Stable appearance of the right pleural drainage catheter. Electronically Signed   By: David  Martinique M.D.   On: 07/02/2017 08:24   Dg Chest Port 1 View  Result Date: 07/01/2017 CLINICAL DATA:  Shortness of breath. Status post pleural drainage catheter 06/29/2017. History of lung cancer. EXAM: PORTABLE CHEST 1 VIEW COMPARISON:  Single-view of the chest 06/30/2017 and 06/27/2017. CT chest 06/27/2017. FINDINGS: Pleural drainage catheter is again seen. There is complete whiteout of the right chest consistent with the presence of a large effusion. 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 Cardiac silhouette is largely obscured. No pneumothorax. IMPRESSION: No change in complete whiteout of the right chest consistent with pleural effusion and airspace disease. Pleural drainage catheter is in place. No new abnormality. Electronically Signed   By: Inge Rise M.D.    On: 07/01/2017 10:58   Dg Chest Port 1 View  Result Date: 06/30/2017 CLINICAL DATA:  Right pleural effusion. EXAM: PORTABLE CHEST 1 VIEW COMPARISON:  06/30/2017 FINDINGS: Continued complete opacification of the right hemithorax with right PleurX catheter remaining in place. No real change. No confluent opacity on the left. Heart is likely normal size. IMPRESSION: No significant change in the white out of the right lung. Electronically Signed   By: Rolm Baptise M.D.   On: 06/30/2017 18:56   Dg Chest Port 1 View  Result Date: 06/30/2017 CLINICAL DATA:  Shortness of breath today. Status post pleural drainage catheter placement 06/29/2017. Lung cancer. EXAM: PORTABLE CHEST 1 VIEW COMPARISON:  Single-view of the chest and CT chest 06/27/2017. FINDINGS: Pleural drainage catheter on the right is identified. There is complete whiteout of the right chest which is unchanged. No focal airspace disease or effusion on the left. No pneumothorax. Cardiac silhouette is largely obscured. IMPRESSION: Persistent complete whiteout of the right chest consistent with a large effusion and airspace disease. Pleural drainage catheter is in place on the right. Electronically Signed   By: Inge Rise M.D.   On: 06/30/2017 12:11   Dg Chest Port 1 View  Result Date: 06/27/2017 CLINICAL DATA:  77 year old female with metastatic non-small cell lung cancer. Shortness of breath for 2 days and diarrhea. Dehydrated, tachycardia. EXAM: PORTABLE CHEST 1 VIEW COMPARISON:  05/17/2017 radiographs and earlier. FINDINGS: Portable AP upright view at 1253 hours. New virtually complete whiteout of the  right hemithorax. Minimal aerated lung suspected about the right hilum. Abrupt cut off of the right mainstem bronchus. No definite mediastinal shift since March. Stable left lung. Stable left mediastinal contours. No acute osseous abnormality identified. IMPRESSION: 1. New nonspecific white out of the right hemithorax. This could reflect a  combination of collapse, consolidation, and pleural effusion. Chest CT (IV contrast preferred) would best characterize further. 2. Stable left lung. Electronically Signed   By: Genevie Ann M.D.   On: 06/27/2017 13:27    ASSESSMENT AND PLAN: This is a very pleasant 77 years old white female recently diagnosed with probably stage IV non-small cell lung cancer presented with large right suprahilar lung mass in addition to mediastinal lymphadenopathy and suspicious malignant right pleural effusion.  The patient underwent systemic chemotherapy with carboplatin, paclitaxel and Keytruda status post 3 cycles.  She was tolerating this treatment well with no concerning complaints except for the last cycle when she was admitted to the hospital with C. difficile as well as dehydration and renal insufficiency.  She was also treated for atrial fibrillation during her hospitalization with Eliquis.  She is scheduled to see cardiology tomorrow for reevaluation and management of the atrial fibrillation. Staging CT scan of the chest after cycle #3 showed no clear evidence for disease progression except for the recurrent right pleural effusion that was positive for malignancy. The patient was recently enrolled with the hospice service of [facility] mainly for management of the Pleurx catheter. I had a lengthy discussion with the patient and her daughter today about her condition and treatment options.  I gave the patient the option of continuing with her current status with palliative care and hospice versus consideration of treatment with maintenance Keytruda 200 mg IV every 3 weeks.  I discussed with the patient the adverse effect of this treatment.  She also understand her prognosis with and without treatment.  The patient decided to proceed with treatment with single agent Keytruda.  She will receive the first cycle of this maintenance therapy today. She will come back for follow-up visit in 3 weeks for evaluation before  starting the second cycle of this treatment. She will revoke the hospice service at this point since she is starting treatment.  We can arrange for the patient to have advanced home care to manage her Pleurx catheter drainage. She was advised to call immediately if she has any concerning symptoms in the interval. The patient voices understanding of current disease status and treatment options and is in agreement with the current care plan. All questions were answered. The patient knows to call the clinic with any problems, questions or concerns. We can certainly see the patient much sooner if necessary. I spent 15 minutes counseling the patient face to face. The total time spent in the appointment was 25 minutes.   Disclaimer: This note was dictated with voice recognition software. Similar sounding words can inadvertently be transcribed and may not be corrected upon review.

## 2017-07-24 NOTE — Patient Instructions (Signed)
Cancer Center Discharge Instructions for Patients Receiving Chemotherapy  Today you received the following chemotherapy agents :  Keytruda.  To help prevent nausea and vomiting after your treatment, we encourage you to take your nausea medication as prescribed.   If you develop nausea and vomiting that is not controlled by your nausea medication, call the clinic.   BELOW ARE SYMPTOMS THAT SHOULD BE REPORTED IMMEDIATELY:  *FEVER GREATER THAN 100.5 F  *CHILLS WITH OR WITHOUT FEVER  NAUSEA AND VOMITING THAT IS NOT CONTROLLED WITH YOUR NAUSEA MEDICATION  *UNUSUAL SHORTNESS OF BREATH  *UNUSUAL BRUISING OR BLEEDING  TENDERNESS IN MOUTH AND THROAT WITH OR WITHOUT PRESENCE OF ULCERS  *URINARY PROBLEMS  *BOWEL PROBLEMS  UNUSUAL RASH Items with * indicate a potential emergency and should be followed up as soon as possible.  Feel free to call the clinic should you have any questions or concerns. The clinic phone number is [phone].  Please show the CHEMO ALERT CARD at check-in to the Emergency Department and triage nurse.  

## 2017-07-24 NOTE — Telephone Encounter (Signed)
Faxed hospice revocation . Referred pt to Palliative care and home health for pleurix drainage

## 2017-07-25 ENCOUNTER — Other Ambulatory Visit: Payer: Self-pay | Admitting: Medical Oncology

## 2017-07-25 ENCOUNTER — Encounter: Payer: Self-pay | Admitting: Cardiology

## 2017-07-25 ENCOUNTER — Inpatient Hospital Stay: Payer: Medicare Other

## 2017-07-25 ENCOUNTER — Encounter: Payer: Self-pay | Admitting: Medical Oncology

## 2017-07-25 ENCOUNTER — Telehealth: Payer: Self-pay | Admitting: Medical Oncology

## 2017-07-25 ENCOUNTER — Ambulatory Visit (INDEPENDENT_AMBULATORY_CARE_PROVIDER_SITE_OTHER): Payer: Medicare Other | Admitting: Cardiology

## 2017-07-25 VITALS — BP 104/76 | HR 94 | Ht 69.0 in | Wt 150.0 lb

## 2017-07-25 DIAGNOSIS — Z7901 Long term (current) use of anticoagulants: Secondary | ICD-10-CM | POA: Diagnosis not present

## 2017-07-25 DIAGNOSIS — C382 Malignant neoplasm of posterior mediastinum: Secondary | ICD-10-CM

## 2017-07-25 DIAGNOSIS — I4891 Unspecified atrial fibrillation: Secondary | ICD-10-CM

## 2017-07-25 DIAGNOSIS — I10 Essential (primary) hypertension: Secondary | ICD-10-CM

## 2017-07-25 DIAGNOSIS — I480 Paroxysmal atrial fibrillation: Secondary | ICD-10-CM

## 2017-07-25 DIAGNOSIS — C3490 Malignant neoplasm of unspecified part of unspecified bronchus or lung: Secondary | ICD-10-CM | POA: Diagnosis not present

## 2017-07-25 DIAGNOSIS — J918 Pleural effusion in other conditions classified elsewhere: Secondary | ICD-10-CM | POA: Diagnosis not present

## 2017-07-25 MED ORDER — APIXABAN 5 MG PO TABS: 5.0000 mg | ORAL_TABLET | Freq: Two times a day (BID) | ORAL | 3 refills | Status: DC

## 2017-07-25 NOTE — Telephone Encounter (Signed)
foaxed order form for pleurix bottle to [facility] .

## 2017-07-25 NOTE — Progress Notes (Signed)
Plan of care-pt came in to office today and was tested for her oxygen saturation .  Pt oxygen saturation measured 88% on room air at rest.

## 2017-07-25 NOTE — Progress Notes (Signed)
5/15/19SATURATION QUALIFICATIONS: (This note is used to comply with regulatory documentation for home oxygen)  Patient Saturations on Room Air at Rest = 88%  Please briefly explain why patient needs home oxygen:Unable to increase oxygen by other means.

## 2017-07-25 NOTE — Patient Instructions (Signed)
Your physician has recommended you make the following change in your medication:  RESTART ELIQUIS 5 MG TWICE DAILY   Your physician recommends that you schedule a follow-up appointment in:  4 -[place] Cancel

## 2017-07-25 NOTE — Telephone Encounter (Signed)
Pt presented today for oxygen saturation testing. At rest on room air her oxygen saturation was 88%.

## 2017-07-31 ENCOUNTER — Telehealth: Payer: Self-pay | Admitting: Medical Oncology

## 2017-07-31 NOTE — Telephone Encounter (Signed)
Returned call to [place] from last week. [facility] will resume orders and see her tomorrow. Pt refused visit today.

## 2017-08-01 DIAGNOSIS — I10 Essential (primary) hypertension: Secondary | ICD-10-CM | POA: Diagnosis not present

## 2017-08-01 DIAGNOSIS — I4891 Unspecified atrial fibrillation: Secondary | ICD-10-CM | POA: Diagnosis not present

## 2017-08-01 DIAGNOSIS — F329 Major depressive disorder, single episode, unspecified: Secondary | ICD-10-CM | POA: Diagnosis not present

## 2017-08-01 DIAGNOSIS — C3490 Malignant neoplasm of unspecified part of unspecified bronchus or lung: Secondary | ICD-10-CM | POA: Diagnosis not present

## 2017-08-01 DIAGNOSIS — Z85841 Personal history of malignant neoplasm of brain: Secondary | ICD-10-CM | POA: Diagnosis not present

## 2017-08-01 DIAGNOSIS — F419 Anxiety disorder, unspecified: Secondary | ICD-10-CM | POA: Diagnosis not present

## 2017-08-01 DIAGNOSIS — J90 Pleural effusion, not elsewhere classified: Secondary | ICD-10-CM | POA: Diagnosis not present

## 2017-08-01 DIAGNOSIS — C382 Malignant neoplasm of posterior mediastinum: Secondary | ICD-10-CM | POA: Diagnosis not present

## 2017-08-01 DIAGNOSIS — Z87891 Personal history of nicotine dependence: Secondary | ICD-10-CM | POA: Diagnosis not present

## 2017-08-01 DIAGNOSIS — Z4682 Encounter for fitting and adjustment of non-vascular catheter: Secondary | ICD-10-CM | POA: Diagnosis not present

## 2017-08-01 DIAGNOSIS — Z9981 Dependence on supplemental oxygen: Secondary | ICD-10-CM | POA: Diagnosis not present

## 2017-08-06 DIAGNOSIS — I4891 Unspecified atrial fibrillation: Secondary | ICD-10-CM | POA: Diagnosis not present

## 2017-08-06 DIAGNOSIS — J90 Pleural effusion, not elsewhere classified: Secondary | ICD-10-CM | POA: Diagnosis not present

## 2017-08-06 DIAGNOSIS — C382 Malignant neoplasm of posterior mediastinum: Secondary | ICD-10-CM | POA: Diagnosis not present

## 2017-08-06 DIAGNOSIS — I10 Essential (primary) hypertension: Secondary | ICD-10-CM | POA: Diagnosis not present

## 2017-08-06 DIAGNOSIS — C3490 Malignant neoplasm of unspecified part of unspecified bronchus or lung: Secondary | ICD-10-CM | POA: Diagnosis not present

## 2017-08-06 DIAGNOSIS — F329 Major depressive disorder, single episode, unspecified: Secondary | ICD-10-CM | POA: Diagnosis not present

## 2017-08-10 ENCOUNTER — Telehealth: Payer: Self-pay

## 2017-08-10 NOTE — Telephone Encounter (Signed)
Spoke with patient concerning changes in her schedule. Per 5/29 los will mail her a letter with a calender enclosed

## 2017-08-14 ENCOUNTER — Inpatient Hospital Stay: Payer: Medicare Other

## 2017-08-14 ENCOUNTER — Inpatient Hospital Stay: Payer: Medicare Other | Attending: Internal Medicine | Admitting: Nurse Practitioner

## 2017-08-14 ENCOUNTER — Encounter: Payer: Self-pay | Admitting: Nurse Practitioner

## 2017-08-14 ENCOUNTER — Telehealth: Payer: Self-pay | Admitting: Nurse Practitioner

## 2017-08-14 VITALS — BP 111/71 | HR 96 | Temp 98.5°F | Resp 17 | Ht 69.0 in

## 2017-08-14 DIAGNOSIS — L299 Pruritus, unspecified: Secondary | ICD-10-CM | POA: Diagnosis not present

## 2017-08-14 DIAGNOSIS — Z79899 Other long term (current) drug therapy: Secondary | ICD-10-CM | POA: Diagnosis not present

## 2017-08-14 DIAGNOSIS — Z5112 Encounter for antineoplastic immunotherapy: Secondary | ICD-10-CM | POA: Diagnosis not present

## 2017-08-14 DIAGNOSIS — C382 Malignant neoplasm of posterior mediastinum: Secondary | ICD-10-CM

## 2017-08-14 DIAGNOSIS — J910 Malignant pleural effusion: Secondary | ICD-10-CM | POA: Insufficient documentation

## 2017-08-14 DIAGNOSIS — C3491 Malignant neoplasm of unspecified part of right bronchus or lung: Secondary | ICD-10-CM | POA: Diagnosis not present

## 2017-08-14 DIAGNOSIS — R5382 Chronic fatigue, unspecified: Secondary | ICD-10-CM

## 2017-08-14 LAB — CMP (CANCER CENTER ONLY)
ALT: 11 U/L (ref 0–55)
ANION GAP: 8 (ref 3–11)
AST: 22 U/L (ref 5–34)
Albumin: 2.7 g/dL — ABNORMAL LOW (ref 3.5–5.0)
Alkaline Phosphatase: 118 U/L (ref 40–150)
BUN: 11 mg/dL (ref 7–26)
CHLORIDE: 101 mmol/L (ref 98–109)
CO2: 32 mmol/L — AB (ref 22–29)
Calcium: 9.6 mg/dL (ref 8.4–10.4)
Creatinine: 0.64 mg/dL (ref 0.60–1.10)
GFR, Estimated: >60 mL/min (ref >60)
Glucose, Bld: 108 mg/dL (ref 70–140)
Potassium: 3.4 mmol/L — ABNORMAL LOW (ref 3.5–5.1)
SODIUM: 141 mmol/L (ref 136–145)
Total Bilirubin: 0.4 mg/dL (ref 0.2–1.2)
Total Protein: 7 g/dL (ref 6.4–8.3)

## 2017-08-14 LAB — CBC WITH DIFFERENTIAL (CANCER CENTER ONLY)
Basophils Absolute: 0 10*3/uL (ref 0.0–0.1)
Basophils Relative: 1 %
EOS ABS: 0.2 10*3/uL (ref 0.0–0.5)
EOS PCT: 3 %
HCT: 35.1 % (ref 34.8–46.6)
Hemoglobin: 11.4 g/dL — ABNORMAL LOW (ref 11.6–15.9)
LYMPHS ABS: 0.8 10*3/uL — AB (ref 0.9–3.3)
Lymphocytes Relative: 13 %
MCH: 27.5 pg (ref 25.1–34.0)
MCHC: 32.4 g/dL (ref 31.5–36.0)
MCV: 85.1 fL (ref 79.5–101.0)
MONO ABS: 0.5 10*3/uL (ref 0.1–0.9)
MONOS PCT: 9 %
Neutro Abs: 4.5 10*3/uL (ref 1.5–6.5)
Neutrophils Relative %: 74 %
PLATELETS: 259 10*3/uL (ref 145–400)
RBC: 4.13 MIL/uL (ref 3.70–5.45)
RDW: 19.8 % — ABNORMAL HIGH (ref 11.2–14.5)
WBC Count: 6 10*3/uL (ref 3.9–10.3)

## 2017-08-14 LAB — TSH: TSH: 3.55 u[IU]/mL (ref 0.308–3.960)

## 2017-08-14 MED ORDER — SODIUM CHLORIDE 0.9 % IV SOLN
Freq: Once | INTRAVENOUS | Status: AC
  Administered 2017-08-14: 15:00:00 via INTRAVENOUS

## 2017-08-14 MED ORDER — HYDROXYZINE HCL 10 MG PO TABS: 10.0000 mg | ORAL_TABLET | Freq: Three times a day (TID) | ORAL | 0 refills | Status: DC | PRN

## 2017-08-14 MED ORDER — PEMBROLIZUMAB CHEMO INJECTION 100 MG/4ML
200.0000 mg | Freq: Once | INTRAVENOUS | Status: AC
  Administered 2017-08-14: 200 mg via INTRAVENOUS
  Filled 2017-08-14: qty 8

## 2017-08-14 NOTE — Patient Instructions (Signed)
[facility] Discharge Instructions for Patients Receiving Chemotherapy  Today you received the following chemotherapy agents:  Keytruda.  To help prevent nausea and vomiting after your treatment, we encourage you to take your nausea medication as directed.   If you develop nausea and vomiting that is not controlled by your nausea medication, call the clinic.   BELOW ARE SYMPTOMS THAT SHOULD BE REPORTED IMMEDIATELY:  *FEVER GREATER THAN 100.5 F  *CHILLS WITH OR WITHOUT FEVER  NAUSEA AND VOMITING THAT IS NOT CONTROLLED WITH YOUR NAUSEA MEDICATION  *UNUSUAL SHORTNESS OF BREATH  *UNUSUAL BRUISING OR BLEEDING  TENDERNESS IN MOUTH AND THROAT WITH OR WITHOUT PRESENCE OF ULCERS  *URINARY PROBLEMS  *BOWEL PROBLEMS  UNUSUAL RASH Items with * indicate a potential emergency and should be followed up as soon as possible.  Feel free to call the clinic should you have any questions or concerns. The clinic phone number is [phone].  Please show the CHEMO ALERT CARD at check-in to the Emergency Department and triage nurse.    

## 2017-08-14 NOTE — Progress Notes (Addendum)
[facility] OFFICE PROGRESS NOTE   DIAGNOSIS: Metastatic non-small cell carcinoma (T3, N2, M1a) non-small cell lung cancer diagnosed in November 2018 and presented with large right suprahilar and mediastinal mass as well as recent right pleural effusion.  Guardant 360: No actionable mutations.  PRIOR THERAPY:  1) Palliative radiotherapy to the large mediastinal mass under the care of Dr. Lisbeth Renshaw. 2) Systemic chemotherapy with carboplatin for AUC of 5, paclitaxel 175 mg/M2 and Keytruda 200 mg IV every 3 weeks.  First dose expected May 01, 2017.  Status post 3 cycles.  CURRENT THERAPY: Maintenance treatment with immunotherapy with single agent Keytruda 200 mg IV every 3 weeks.  First cycle 07/24/2017.    INTERVAL HISTORY:   Ms. Cassidy Sanders returns as scheduled.  She completed cycle 1 Keytruda 07/24/2017.  She has had a few episodes of nausea/vomiting.  No mouth sores.  No diarrhea.  Over the past 3 weeks she has noted various areas of pruritus in the absence of a rash.  She has stable dyspnea.  Dry cough for the past week.  No fever.  Appetite is poor.  She has had no output from the Pleurx catheter for the past 2 weeks and would like to have it removed.  Objective:  Vital signs in last 24 hours:  Blood pressure 111/71, pulse 96, temperature 98.5 F (36.9 C), temperature source Oral, resp. rate 17, height 5\' 9"  (1.753 m), SpO2 91 %.    HEENT: No thrush or ulcers. Resp: Diminished breath sounds at the right lower lung field.  No respiratory distress.  Right Pleurx catheter. Cardio: Regular rate and rhythm. GI: Abdomen soft and nontender.  No hepatomegaly. Vascular: No leg edema. Neuro: Alert and oriented. Skin: Skin in general has a dry appearance.  No rash.  Small abrasions at the upper chest.   Lab Results:  Lab Results  Component Value Date   WBC 6.0 08/14/2017   HGB 11.4 (L) 08/14/2017   HCT 35.1 08/14/2017   MCV 85.1 08/14/2017   PLT 259 08/14/2017   NEUTROABS 4.5 08/14/2017    Imaging:  No results found.  Medications: I have reviewed the patient's current medications.  Assessment/Plan: 1. Stage IV non-small cell lung cancer presenting with a large right suprahilar lung mass in addition to mediastinal lymphadenopathy and suspicious malignant right pleural effusion.  Status post systemic chemotherapy with carboplatin/Taxol/Keytruda for 3 cycles.  Restaging CT scan after cycle 3 showed no clear evidence for disease progression except for the recurrent right pleural effusion that was positive for malignancy.  She began single agent Keytruda 07/24/2017.  Disposition: Ms. Cassidy Sanders appears stable.  She has completed 1 cycle of single agent Keytruda.  Plan to proceed with cycle 2 today as scheduled.  Dr. Julien Nordmann recommends restaging CT evaluation after she has completed 3 cycles.  For the pruritus in the absence of a rash she will try Atarax 10 mg 3 times a day as needed.  We discussed the potential for sedation.  She understands she should not be driving.  She has a follow-up appointment with Dr. Lake Bells tomorrow and will discuss removal of the Pleurx catheter with him.  She will return for a follow-up visit and cycle 3 Keytruda in 3 weeks.  She will contact the office in the interim with any problems.  Patient seen with Dr. Julien Nordmann.   Ned Card ANP/GNP-BC   08/14/2017  2:41 PM  ADDENDUM: Hematology/Oncology Attending: I had a face-to-face encounter with the patient.  I recommended her care plan.  This is a very pleasant 77 years old white female with metastatic non-small cell lung cancer, squamous cell carcinoma status post 3 cycles of systemic chemotherapy with carboplatin, paclitaxel and Keytruda.  Restaging scan at that time showed no concerning findings for disease progression but the patient had a rough time tolerating the systemic chemotherapy.  She continued on single agent Keytruda starting from cycle #4 and has been  tolerating this treatment fairly well.  She is status post 1 cycle of this treatment. I recommended for the patient to proceed with cycle #2 of Keytruda today as a scheduled. For the right pleural effusion the patient has no significant drainage from the Pleurx catheter and she would like it to be removed.  She will be seen by Dr. Lake Bells tomorrow and we will wait for him to make a decision before removal of the Pleurx catheter. She will come back for follow-up visit in 3 weeks for evaluation before starting cycle #3. The patient was advised to call immediately if she has any concerning symptoms in the interval.  Disclaimer: This note was dictated with voice recognition software. Similar sounding words can inadvertently be transcribed and may be missed upon review. [name], MD 08/15/17

## 2017-08-14 NOTE — Telephone Encounter (Signed)
No los 6/4

## 2017-08-15 ENCOUNTER — Ambulatory Visit (INDEPENDENT_AMBULATORY_CARE_PROVIDER_SITE_OTHER): Payer: Medicare Other | Admitting: Pulmonary Disease

## 2017-08-15 ENCOUNTER — Encounter: Payer: Self-pay | Admitting: Pulmonary Disease

## 2017-08-15 VITALS — BP 122/80 | HR 91 | Ht 69.0 in | Wt 161.4 lb

## 2017-08-15 DIAGNOSIS — J90 Pleural effusion, not elsewhere classified: Secondary | ICD-10-CM

## 2017-08-15 DIAGNOSIS — R0602 Shortness of breath: Secondary | ICD-10-CM | POA: Diagnosis not present

## 2017-08-15 DIAGNOSIS — R5381 Other malaise: Secondary | ICD-10-CM | POA: Diagnosis not present

## 2017-08-15 NOTE — Addendum Note (Signed)
Addended by: Amado Coe on: 08/15/2017 11:12 AM   Modules accepted: Orders

## 2017-08-15 NOTE — Assessment & Plan Note (Addendum)
Improved today on exam Patient to continue use nebulizers as needed Continue oxygen therapy Follow-up with Dr. Lake Bells in 3 months Notify office if you are needing more oxygen or having increased shortness of breath

## 2017-08-15 NOTE — Progress Notes (Signed)
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 Developed a malignant effusion in 2019.  Hospice consulted in 2019.    Recent office visits:  05/08/17-office visit - [facility] Seen for shortness of breath-resolved with prednisone and rescue inhaler.  Continues to benefit from oxygen.  Appears weaker today. Plan: Keep follow-up with oncology clinic, follow-up with oncology social workers to help with in-home needs, follow-up in 2 months or sooner if needed, continue oxygen therapy as prescribed, chest x-ray today   HPI  77 year old female patient presenting today for follow-up appointment.  Patient is requesting to have her Pleurex drain removed.  Patient says that she is feeling much better, and that since initially placing it drain has been slowly decreasing.  And has not received any drainage in the last 2 weeks.  Patient is reporting that she is had a slight cough sometimes bringing up yellow or green mucus or drainage.  Patient thinks it is postnasal drip.  Patient has been afebrile, no other pertinent complaints.  Patient has been using nebulizing treatments to help with occasional wheezing.  Patient reports that she is feeling much better over the past couple weeks reporting only needing 2 L of oxygen and able to do more.  Patient also reporting that she now has a home health aide who helps out at home every day of the week for about 48 hours depending on the day.  This is also been helpful for the patient as she is a caregiver for her husband who has some decreased cognitive functioning potentially early dementia.   Chief Complaint  Patient presents with  . Follow-up    SOB with Cough - Green mucous     Past Medical History:  Diagnosis Date  . Arrhythmia   . Atrial fibrillation ([place])   . Brain tumor (benign) ([place])   . Depression   .  Hypertension      Family History  Family history unknown: Yes     Review of Systems  Constitutional: Positive for malaise/fatigue. Negative for chills and fever.  HENT: Negative for congestion, ear discharge, sinus pain and sore throat.   Respiratory: Positive for cough, sputum production (green / yellow occasionally) and shortness of breath (with exertion ). Negative for hemoptysis, wheezing and stridor.   Cardiovascular: Negative for claudication and leg swelling.  Gastrointestinal: Positive for nausea (Occasionally when she overeats). Negative for blood in stool, constipation, diarrhea and vomiting.  Genitourinary: Negative for dysuria, frequency and hematuria.  Skin: Positive for itching (From adhesive Pleurex drain cover).  Neurological: Positive for weakness. Negative for dizziness and headaches.  All other systems reviewed and are negative.   Objective:  Physical Exam  Constitutional: She is well-developed, well-nourished, and in no distress. No distress. She is not intubated.  HENT:  Right Ear: Hearing and external ear normal. No swelling or tenderness.  Left Ear: Hearing, tympanic membrane, external ear and ear canal normal.  Nose: Nose normal.  Mouth/Throat: Oropharynx is clear and moist. No oropharyngeal exudate.  Right canal occluded with cerumen  Neck: Normal range of motion. Neck supple. No JVD present. No tracheal deviation present.  Cardiovascular: Normal rate, regular rhythm, normal heart sounds and intact distal pulses.  No murmur heard. Pulmonary/Chest: Effort normal. No stridor. She is not intubated. No respiratory distress. She has wheezes (minor right left upper expiratory wheezes) in the right upper field  and the left upper field. She has no rales. She exhibits no tenderness.  Dullness in Right base  Abdominal: Soft. Bowel sounds are normal. She exhibits no distension. There is no tenderness.  Lymphadenopathy:    She has no cervical adenopathy.    Neurological: She is alert. Gait normal.  Skin: Skin is warm, dry and intact. She is not diaphoretic.     Nursing note and vitals reviewed.     Vitals:   08/15/17 1002  BP: 122/80  Pulse: 91  SpO2: 92%  Weight: 161 lb 6.4 oz (73.2 kg)  Height: 5\' 9"  (1.753 m)   2L [place] -pulsed air system     CBC    Component Value Date/Time   WBC 6.0 08/14/2017 1312   WBC 6.1 07/04/2017 0313   RBC 4.13 08/14/2017 1312   HGB 11.4 (L) 08/14/2017 1312   HGB 11.8 03/15/2017 1436   HCT 35.1 08/14/2017 1312   HCT 37.8 03/15/2017 1436   PLT 259 08/14/2017 1312   PLT 215 03/15/2017 1436   MCV 85.1 08/14/2017 1312   MCV 92.4 03/15/2017 1436   MCH 27.5 08/14/2017 1312   MCHC 32.4 08/14/2017 1312   RDW 19.8 (H) 08/14/2017 1312   RDW 15.3 (H) 03/15/2017 1436   LYMPHSABS 0.8 (L) 08/14/2017 1312   LYMPHSABS 1.8 03/15/2017 1436   MONOABS 0.5 08/14/2017 1312   MONOABS 0.7 03/15/2017 1436   EOSABS 0.2 08/14/2017 1312   EOSABS 0.1 03/15/2017 1436   BASOSABS 0.0 08/14/2017 1312   BASOSABS 0.0 03/15/2017 1436      Records from her last visit with Dr. Julien Nordmann reviewed.  She now has a malignant effusion and has a pleurex.  She has been treated with systemic chemotherapy and Keytruda.     Assessment & Plan:   Pleasant 77 year old patient of Dr. Lake Bells seen today for follow-up appointment.  Patient does have some minor upper lobe wheezes but clinically appears much better than her February assessment.  Patient agrees with this assessment and says she feels much better as well.  Patient requesting that we remove her Pleurex drain as she has not had any drainage from this over the last 2 weeks -and was decreasing significantly prior to that, oncology is on board but would prefer for [place] to write the order.  I believe that this is reasonable in order will be placed today.  Order placed for IR to pull drain.  Patient can follow-up as needed with her shortness of breath and if she is having respiratory  changes.  If not patient can see Dr. Lake Bells in the next 3 months.  Problem List Items Addressed This Visit      Respiratory   Pleural effusion - Primary   Relevant Orders   Ambulatory referral to Interventional Radiology     Other   SOB (shortness of breath)    Improved today on exam Patient to continue use nebulizers as needed Continue oxygen therapy Follow-up with Dr. Lake Bells in 3 months Notify office if you are needing more oxygen or having increased shortness of breath      Physical deconditioning    Improved on exam today            Current Outpatient Medications:  .  apixaban (ELIQUIS) 5 MG TABS tablet, Take 1 tablet (5 mg total) by mouth 2 (two) times daily. (Patient taking differently: Take 5 mg by mouth 2 (two) times daily. ), Disp: 180 tablet, Rfl: 3 .  Biotin 2.5 MG TABS,  Take 2,500 mcg by mouth daily., Disp: , Rfl:  .  diphenhydrAMINE (BENADRYL) 25 mg capsule, Take 1 capsule (25 mg total) by mouth at bedtime as needed for sleep., Disp: 30 capsule, Rfl: 0 .  feeding supplement, ENSURE ENLIVE, (ENSURE ENLIVE) LIQD, Take 237 mLs by mouth 2 (two) times daily as needed (If patient or family requests, if pt unable to eat for a meal, or if intakes are poor)., Disp: 237 mL, Rfl: 12 .  hydrOXYzine (ATARAX/VISTARIL) 10 MG tablet, Take 1 tablet (10 mg total) by mouth 3 (three) times daily as needed for itching., Disp: 30 tablet, Rfl: 0 .  levalbuterol (XOPENEX) 0.63 MG/3ML nebulizer solution, Take 3 mLs (0.63 mg total) by nebulization every 6 (six) hours as needed for wheezing or shortness of breath., Disp: 3 mL, Rfl: 12 .  loperamide (IMODIUM) 2 MG capsule, Take 2 mg by mouth as needed for diarrhea or loose stools., Disp: , Rfl:  .  metoprolol tartrate (LOPRESSOR) 50 MG tablet, Take 1 tablet (50 mg total) by mouth 2 (two) times daily. (Patient taking differently: Take 50 mg by mouth daily. ), Disp: 60 tablet, Rfl: 0 .  sertraline (ZOLOFT) 50 MG tablet, Take 50 mg by mouth  daily., Disp: , Rfl:

## 2017-08-15 NOTE — Patient Instructions (Addendum)
Continue your hard work working on your health Keep close follow-up with oncology >>>Okay to discontinue Pleurex drain  >>>>>will place order for IR to coordinate and complete Follow-up with our office if you are having any sort of changes in your respiratory status, needing more oxygen, or sputum production Follow-up in 3 months to see Dr. Lake Bells or sooner if needed   Please contact the office if your symptoms worsen or you have concerns that you are not improving.   Thank you for choosing [facility] for your healthcare, and for allowing [place] to partner with you on your healthcare journey. I am thankful to be able to provide care to you today.   Wyn Quaker FNP-C

## 2017-08-15 NOTE — Assessment & Plan Note (Signed)
Continue your hard work working on your health Keep close follow-up with oncology >>>Okay to discontinue Pleurex drain  Continue oxygen therapy as prescribed Follow-up with our office if you are having any sort of changes in your respiratory status, needing more oxygen, or sputum production Follow-up in 3 months to see Dr. Lake Bells or sooner if needed

## 2017-08-15 NOTE — Assessment & Plan Note (Signed)
Improved on exam today

## 2017-08-16 DIAGNOSIS — I10 Essential (primary) hypertension: Secondary | ICD-10-CM | POA: Diagnosis not present

## 2017-08-16 DIAGNOSIS — C3490 Malignant neoplasm of unspecified part of unspecified bronchus or lung: Secondary | ICD-10-CM | POA: Diagnosis not present

## 2017-08-16 DIAGNOSIS — C382 Malignant neoplasm of posterior mediastinum: Secondary | ICD-10-CM | POA: Diagnosis not present

## 2017-08-16 DIAGNOSIS — J90 Pleural effusion, not elsewhere classified: Secondary | ICD-10-CM | POA: Diagnosis not present

## 2017-08-16 DIAGNOSIS — F329 Major depressive disorder, single episode, unspecified: Secondary | ICD-10-CM | POA: Diagnosis not present

## 2017-08-16 DIAGNOSIS — I4891 Unspecified atrial fibrillation: Secondary | ICD-10-CM | POA: Diagnosis not present

## 2017-08-16 NOTE — Progress Notes (Signed)
Patient seen and examined.  Cassidy Sanders has a cough, some tightness in her chest, but not too much.  Pleur-ex hasn't drained anything in 2 weeks.  Breathing is baseline, she remains very upbeat  On exam Diminished R base, clear otherwise, normal breath sounds on left Belly soft CV: rrr, no mgr  Malignant effusion: remove the pleurex Cough: suggested hypertonic saline or a stronger cough medicine, she refused

## 2017-08-23 ENCOUNTER — Telehealth: Payer: Self-pay | Admitting: Cardiology

## 2017-08-23 DIAGNOSIS — C3490 Malignant neoplasm of unspecified part of unspecified bronchus or lung: Secondary | ICD-10-CM | POA: Diagnosis not present

## 2017-08-23 DIAGNOSIS — F329 Major depressive disorder, single episode, unspecified: Secondary | ICD-10-CM | POA: Diagnosis not present

## 2017-08-23 DIAGNOSIS — I10 Essential (primary) hypertension: Secondary | ICD-10-CM | POA: Diagnosis not present

## 2017-08-23 DIAGNOSIS — C382 Malignant neoplasm of posterior mediastinum: Secondary | ICD-10-CM | POA: Diagnosis not present

## 2017-08-23 DIAGNOSIS — I4891 Unspecified atrial fibrillation: Secondary | ICD-10-CM | POA: Diagnosis not present

## 2017-08-23 DIAGNOSIS — J90 Pleural effusion, not elsewhere classified: Secondary | ICD-10-CM | POA: Diagnosis not present

## 2017-08-23 NOTE — Telephone Encounter (Signed)
New message    Pt c/o medication issue:  1. Name of Medication: eliquis 5 mg   2. How are you currently taking this medication (dosage and times per day)? Pt is taking once daily-was instructed to take twice daily.  3. Are you having a reaction (difficulty breathing--STAT)?   4. What is your medication issue? Home health nurse is calling to report that pt states she had blood in her urine twice yesterday. Home Health nurse told her not to take any more until she heard from someone about the medication.

## 2017-08-23 NOTE — Telephone Encounter (Signed)
Home health nurse called about patient having bright red blood in her BM twice yesterday. Called patient to find out more details. Encouraged patient not to stop eliquis until doctor has been consulted. Encouraged patient to call her PCP for rectal bleeding. Patient wanted to know if she could stop eliquis a day early. Patient stated she is having a Pleurex drain removed and her pulmonologist wanted her to start holding eliquis on Saturday. Will consult DOD, Dr. Saunders Revel. He advised that it would be fine to hold eliquis for now, but follow up with PCP ASAP for rectal bleed. Melina Modena, home health nurse with Dr. Darnelle Bos recommendations. Baker Janus will call patient with instructions.

## 2017-08-26 ENCOUNTER — Other Ambulatory Visit: Payer: Self-pay | Admitting: Radiology

## 2017-08-28 ENCOUNTER — Ambulatory Visit (HOSPITAL_COMMUNITY)
Admission: RE | Admit: 2017-08-28 | Discharge: 2017-08-28 | Disposition: A | Discharge status: Home / Self Care | Payer: Medicare Other | Source: Ambulatory Visit | Attending: Pulmonary Disease | Admitting: Pulmonary Disease

## 2017-08-28 ENCOUNTER — Encounter (HOSPITAL_COMMUNITY): Payer: Self-pay

## 2017-08-28 DIAGNOSIS — J910 Malignant pleural effusion: Secondary | ICD-10-CM | POA: Diagnosis not present

## 2017-08-28 DIAGNOSIS — Z85118 Personal history of other malignant neoplasm of bronchus and lung: Secondary | ICD-10-CM | POA: Diagnosis not present

## 2017-08-28 DIAGNOSIS — Z923 Personal history of irradiation: Secondary | ICD-10-CM | POA: Insufficient documentation

## 2017-08-28 DIAGNOSIS — Z9981 Dependence on supplemental oxygen: Secondary | ICD-10-CM | POA: Diagnosis not present

## 2017-08-28 DIAGNOSIS — F329 Major depressive disorder, single episode, unspecified: Secondary | ICD-10-CM | POA: Diagnosis not present

## 2017-08-28 DIAGNOSIS — Z9221 Personal history of antineoplastic chemotherapy: Secondary | ICD-10-CM | POA: Diagnosis not present

## 2017-08-28 DIAGNOSIS — Z91040 Latex allergy status: Secondary | ICD-10-CM | POA: Diagnosis not present

## 2017-08-28 DIAGNOSIS — I4891 Unspecified atrial fibrillation: Secondary | ICD-10-CM | POA: Insufficient documentation

## 2017-08-28 DIAGNOSIS — J90 Pleural effusion, not elsewhere classified: Secondary | ICD-10-CM

## 2017-08-28 DIAGNOSIS — C801 Malignant (primary) neoplasm, unspecified: Secondary | ICD-10-CM | POA: Diagnosis not present

## 2017-08-28 DIAGNOSIS — I10 Essential (primary) hypertension: Secondary | ICD-10-CM | POA: Insufficient documentation

## 2017-08-28 DIAGNOSIS — Z4682 Encounter for fitting and adjustment of non-vascular catheter: Secondary | ICD-10-CM | POA: Insufficient documentation

## 2017-08-28 DIAGNOSIS — Z7901 Long term (current) use of anticoagulants: Secondary | ICD-10-CM | POA: Insufficient documentation

## 2017-08-28 HISTORY — DX: Dependence on supplemental oxygen: Z99.81

## 2017-08-28 HISTORY — DX: Cardiac arrhythmia, unspecified: I49.9

## 2017-08-28 HISTORY — DX: Dyspnea, unspecified: R06.00

## 2017-08-28 HISTORY — PX: IR REMOVAL OF PLURAL CATH W/CUFF: IMG5346

## 2017-08-28 LAB — BASIC METABOLIC PANEL
ANION GAP: 10 (ref 5–15)
BUN: 10 mg/dL (ref 6–20)
CO2: 31 mmol/L (ref 22–32)
Calcium: 9.5 mg/dL (ref 8.9–10.3)
Chloride: 103 mmol/L (ref 101–111)
Creatinine, Ser: 0.62 mg/dL (ref 0.44–1.00)
GFR calc Af Amer: >60 mL/min (ref >60)
GFR calc non Af Amer: >60 mL/min (ref >60)
GLUCOSE: 94 mg/dL (ref 65–99)
POTASSIUM: 3.1 mmol/L — AB (ref 3.5–5.1)
Sodium: 144 mmol/L (ref 135–145)

## 2017-08-28 LAB — CBC WITH DIFFERENTIAL/PLATELET
Basophils Absolute: 0 10*3/uL (ref 0.0–0.1)
Basophils Relative: 0 %
Eosinophils Absolute: 0.1 10*3/uL (ref 0.0–0.7)
Eosinophils Relative: 1 %
HEMATOCRIT: 38.3 % (ref 36.0–46.0)
Hemoglobin: 12 g/dL (ref 12.0–15.0)
LYMPHS PCT: 14 %
Lymphs Abs: 1 10*3/uL (ref 0.7–4.0)
MCH: 27.7 pg (ref 26.0–34.0)
MCHC: 31.3 g/dL (ref 30.0–36.0)
MCV: 88.5 fL (ref 78.0–100.0)
MONO ABS: 0.7 10*3/uL (ref 0.1–1.0)
Monocytes Relative: 10 %
NEUTROS ABS: 5.3 10*3/uL (ref 1.7–7.7)
Neutrophils Relative %: 75 %
Platelets: 267 10*3/uL (ref 150–400)
RBC: 4.33 MIL/uL (ref 3.87–5.11)
RDW: 16 % — AB (ref 11.5–15.5)
WBC: 7.1 10*3/uL (ref 4.0–10.5)

## 2017-08-28 LAB — PROTIME-INR
INR: 1
Prothrombin Time: 13.1 seconds (ref 11.4–15.2)

## 2017-08-28 MED ORDER — FENTANYL CITRATE (PF) 100 MCG/2ML IJ SOLN
INTRAMUSCULAR | Status: AC
  Filled 2017-08-28: qty 2

## 2017-08-28 MED ORDER — FENTANYL CITRATE (PF) 100 MCG/2ML IJ SOLN
INTRAMUSCULAR | Status: AC | PRN
  Administered 2017-08-28: 50 ug via INTRAVENOUS

## 2017-08-28 MED ORDER — LIDOCAINE-EPINEPHRINE (PF) 2 %-1:200000 IJ SOLN
INTRAMUSCULAR | Status: AC | PRN
  Administered 2017-08-28 (×2): 10 mL

## 2017-08-28 MED ORDER — MIDAZOLAM HCL 2 MG/2ML IJ SOLN
INTRAMUSCULAR | Status: AC | PRN
  Administered 2017-08-28: 1 mg via INTRAVENOUS

## 2017-08-28 MED ORDER — LIDOCAINE-EPINEPHRINE (PF) 2 %-1:200000 IJ SOLN
INTRAMUSCULAR | Status: AC
  Filled 2017-08-28: qty 20

## 2017-08-28 MED ORDER — CEFAZOLIN SODIUM-DEXTROSE 2-4 GM/100ML-% IV SOLN
2.0000 g | INTRAVENOUS | Status: AC
  Administered 2017-08-28: 2 g via INTRAVENOUS

## 2017-08-28 MED ORDER — SODIUM CHLORIDE 0.9 % IV SOLN
INTRAVENOUS | Status: DC
  Administered 2017-08-28: 13:00:00 via INTRAVENOUS

## 2017-08-28 MED ORDER — CEFAZOLIN SODIUM-DEXTROSE 2-4 GM/100ML-% IV SOLN
INTRAVENOUS | Status: AC
  Administered 2017-08-28: 2 g via INTRAVENOUS
  Filled 2017-08-28: qty 100

## 2017-08-28 MED ORDER — MIDAZOLAM HCL 2 MG/2ML IJ SOLN
INTRAMUSCULAR | Status: AC
  Filled 2017-08-28: qty 2

## 2017-08-28 NOTE — Procedures (Signed)
Pre procedural Dx: Malignant right sided pleural effusion, now with non-functional PleurX catheter. Post procedural Dx: Same  Preprocedural spot fluoroscopic image of the right hemithorax demonstrates persistent near complete opacification of the right hemithorax.   These findings were discussed at length with the patient and as the PleurX catheter is currently nonfunctional, the decision was made to proceed with catheter removal.   The biliary drainage catheter was removed at the patient's bedside without complication.   Complications: None immediate.  Ronny Bacon, MD Pager #: [phone]

## 2017-08-28 NOTE — H&P (Signed)
Referring Physician(s): Mack,Brian P/McQuaid,B/Mohamed,M  Supervising Physician: Sandi Mariscal  Patient Status:  [facility]  Chief Complaint:  "I'm here to get my chest drain out"  Subjective: Patient familiar to IR service from prior right thoracentesis on 05/11/2017 and right Pleurx catheter placement on 06/29/2017.  She has a history of metastatic non-small cell/squamous cell lung cancer and is status post chemoradiation.  She is currently on Keytruda.  Patient reports that she has had minimal output from the Pleurx catheter and was last drained approximately 4 weeks ago.  She presents today for Pleurx catheter removal.  She currently denies fever, headache, chest pain, worsening dyspnea, abdominal/back pain, nausea, vomiting or bleeding.  She does have occasional cough.  Past Medical History:  Diagnosis Date  . Arrhythmia   . Atrial fibrillation ([place])   . Brain tumor (benign) ([place])   . Depression   . Dyspnea    home oxygen  . Dysrhythmia    a fib  . History of home oxygen therapy 02/2017   2 L/minute  . Hypertension    Past Surgical History:  Procedure Laterality Date  . ABDOMINAL HYSTERECTOMY    . BLADDER SURGERY    . BRAIN SURGERY    . ENDOBRONCHIAL ULTRASOUND Bilateral 02/06/2017   Procedure: ENDOBRONCHIAL ULTRASOUND;  Surgeon: Juanito Doom, MD;  Location: [facility];  Service: Cardiopulmonary;  Laterality: Bilateral;  . IR GUIDED DRAIN W CATHETER PLACEMENT  06/29/2017  . IR US GUIDE BX ASP/DRAIN  06/29/2017  . VIDEO BRONCHOSCOPY WITH ENDOBRONCHIAL ULTRASOUND N/A 03/23/2017   Procedure: VIDEO BRONCHOSCOPY WITH ENDOBRONCHIAL ULTRASOUND;  Surgeon: Juanito Doom, MD;  Location: [facility];  Service: Thoracic;  Laterality: N/A;       Allergies: Latex; Gabapentin; and Tape  Medications: Prior to Admission medications   Medication Sig Start Date End Date Taking? Authorizing Provider  Biotin 2.5 MG TABS Take 2,500 mcg by mouth daily.   Yes [provider]    diphenhydrAMINE (BENADRYL) 25 mg capsule Take 1 capsule (25 mg total) by mouth at bedtime as needed for sleep. 07/06/17  Yes Hosie Poisson, MD  feeding supplement, ENSURE ENLIVE, (ENSURE ENLIVE) LIQD Take 237 mLs by mouth 2 (two) times daily as needed (If patient or family requests, if pt unable to eat for a meal, or if intakes are poor). 07/06/17  Yes Hosie Poisson, MD  hydrOXYzine (ATARAX/VISTARIL) 10 MG tablet Take 1 tablet (10 mg total) by mouth 3 (three) times daily as needed for itching. 08/14/17  Yes Owens Shark, NP  metoprolol tartrate (LOPRESSOR) 50 MG tablet Take 1 tablet (50 mg total) by mouth 2 (two) times daily. Patient taking differently: Take 50 mg by mouth daily.  07/06/17  Yes Hosie Poisson, MD  sertraline (ZOLOFT) 50 MG tablet Take 50 mg by mouth daily.   Yes [provider]  apixaban (ELIQUIS) 5 MG TABS tablet Take 1 tablet (5 mg total) by mouth 2 (two) times daily. Patient taking differently: Take 5 mg by mouth 2 (two) times daily.  07/25/17   Isaiah Serge, NP  levalbuterol Penne Lash) 0.63 MG/3ML nebulizer solution Take 3 mLs (0.63 mg total) by nebulization every 6 (six) hours as needed for wheezing or shortness of breath. 07/06/17   Hosie Poisson, MD  loperamide (IMODIUM) 2 MG capsule Take 2 mg by mouth as needed for diarrhea or loose stools.    [provider]     Vital Signs: BP (!) 150/94   Pulse (!) 111   Temp  98.4 F (36.9 C) (Oral)   Resp (!) 22   SpO2 95%   Physical Exam awake, alert.  Chest with distant breath sounds on right, left clear.  Heart with tachycardic but regular rhythm.  Abdomen soft, positive bowel sounds, nontender.  Right anterior Pleurx catheter intact, site nontender, dressing dry.  No significant lower extremity edema.  Imaging: No results found.  Labs:  CBC: Recent Labs    07/13/17 0818 07/24/17 1323 08/14/17 1312 08/28/17 1239  WBC 6.6 6.9 6.0 7.1  HGB 10.6* 10.4* 11.4* 12.0  HCT 34.9 34.7* 35.1 38.3  PLT 296 330  259 267    COAGS: Recent Labs    02/05/17 1101 03/23/17 1059 06/28/17 0605 08/28/17 1239  INR 0.99 1.00 1.21 1.00  APTT 25 32 34  --     BMP: Recent Labs    07/04/17 0313 07/13/17 0818 07/24/17 1323 08/14/17 1312  NA 142 138 140 141  K 3.6 3.9 4.2 3.4*  CL 105 103 102 101  CO2 28 28 31* 32*  GLUCOSE 102* 140 119 108  BUN <5* 9 11 11   CALCIUM 8.2* 8.9 9.1 9.6  CREATININE 0.42* 0.68 0.67 0.64  GFRNONAA >60 >60 >60 >60  GFRAA >60 >60 >60 >60    LIVER FUNCTION TESTS: Recent Labs    06/26/17 1322 07/13/17 0818 07/24/17 1323 08/14/17 1312  BILITOT 0.7 0.5 0.2 0.4  AST 27 17 34 22  ALT 18 8 17 11   ALKPHOS 81 79 111 118  PROT 6.3* 5.9* 6.5 7.0  ALBUMIN 2.7* 2.2* 2.3* 2.7*    Assessment and Plan:  Pt with history of metastatic non-small cell/squamous cell lung cancer ; status post chemoradiation.  She is currently on Keytruda.  She is status post right Pleurx catheter placement on 06/29/2017 for recurrent pleural effusion.  Patient reports that she has had minimal output from the Pleurx catheter and was last drained approximately 4 weeks ago.  She presents today for Pleurx catheter removal.  Last dose of Eliquis was last Thursday.  Details/risks of procedure, including but not limited to, internal bleeding, infection, injury to adjacent structures discussed with patient and daughter with their understanding and consent.  Electronically Signed: D. Rowe Robert, PA-C 08/28/2017, 1:19 PM   I spent a total of 20 minutes at the the patient's bedside AND on the patient's hospital floor or unit, greater than 50% of which was counseling/coordinating care for right Pleurx catheter removal

## 2017-08-28 NOTE — Discharge Instructions (Signed)
Moderate Conscious Sedation, Adult, Care After These instructions provide you with information about caring for yourself after your procedure. Your health care provider may also give you more specific instructions. Your treatment has been planned according to current medical practices, but problems sometimes occur. Call your health care provider if you have any problems or questions after your procedure. What can I expect after the procedure? After your procedure, it is common:  To feel sleepy for several hours.  To feel clumsy and have poor balance for several hours.  To have poor judgment for several hours.  To vomit if you eat too soon.  Follow these instructions at home: For at least 24 hours after the procedure:   Do not: ? Participate in activities where you could fall or become injured. ? Drive. ? Use heavy machinery. ? Drink alcohol. ? Take sleeping pills or medicines that cause drowsiness. ? Make important decisions or sign legal documents. ? Take care of children on your own.  Rest. Eating and drinking  Follow the diet recommended by your health care provider.  If you vomit: ? Drink water, juice, or soup when you can drink without vomiting. ? Make sure you have little or no nausea before eating solid foods. General instructions  Have a responsible adult stay with you until you are awake and alert.  Take over-the-counter and prescription medicines only as told by your health care provider.  If you smoke, do not smoke without supervision.  Keep all follow-up visits as told by your health care provider. This is important. Contact a health care provider if:  You keep feeling nauseous or you keep vomiting.  You feel light-headed.  You develop a rash.  You have a fever. Get help right away if:  You have trouble breathing. This information is not intended to replace advice given to you by your health care provider. Make sure you discuss any questions you have  with your health care provider. Document Released: 12/18/2012 Document Revised: 08/02/2015 Document Reviewed: 06/19/2015 Elsevier Interactive Patient Education  2018 [place].  May Remove dressing after 3:00 pm tomorrow. You do not need to apply another dressing, unless it is not closed.

## 2017-08-29 DIAGNOSIS — F329 Major depressive disorder, single episode, unspecified: Secondary | ICD-10-CM | POA: Diagnosis not present

## 2017-08-29 DIAGNOSIS — I10 Essential (primary) hypertension: Secondary | ICD-10-CM | POA: Diagnosis not present

## 2017-08-29 DIAGNOSIS — J90 Pleural effusion, not elsewhere classified: Secondary | ICD-10-CM | POA: Diagnosis not present

## 2017-08-29 DIAGNOSIS — C382 Malignant neoplasm of posterior mediastinum: Secondary | ICD-10-CM | POA: Diagnosis not present

## 2017-08-29 DIAGNOSIS — I4891 Unspecified atrial fibrillation: Secondary | ICD-10-CM | POA: Diagnosis not present

## 2017-08-29 DIAGNOSIS — C3490 Malignant neoplasm of unspecified part of unspecified bronchus or lung: Secondary | ICD-10-CM | POA: Diagnosis not present

## 2017-08-29 NOTE — Progress Notes (Signed)
Can we call and just check on the patient and see how she is doing after her procedure?    If she has any concerns with her breathing or worsening shortness of breath, please follow-up with our office.  Wyn Quaker FNP

## 2017-09-03 NOTE — Progress Notes (Signed)
[facility] OFFICE PROGRESS NOTE  [name], MD [address] [place] [address]  DIAGNOSIS: Metastatic non-small cell carcinoma (T3, N2, M1a) non-small cell lung cancer diagnosed in November 2018 and presented with large right suprahilar and mediastinal mass as well as recent right pleural effusion.  Guardant 360: No actionable mutations.  PRIOR THERAPY: 1) Palliative radiotherapy to the large mediastinal mass under the care of Dr. Lisbeth Renshaw. 2) Systemic chemotherapy with carboplatin for AUC of 5, paclitaxel 175 mg/M2 and Keytruda 200 mg IV every 3 weeks.  First dose expected May 01, 2017.  Status post 3 cycles.  CURRENT THERAPY: Maintenance treatment with immunotherapy with single agent Keytruda 200 mg IV every 3 weeks.  First cycle 07/24/2017.  Status post 2 cycles.  INTERVAL HISTORY: Cassidy Sanders 77 y.o. female returns for routine follow-up visit accompanied by her daughter.  The patient is feeling fine today and has no specific complaints except for itching.  She tried Atarax on one occasion which did not help.  She has not tried it since.  She reports that she has tried some hydrocortisone cream with minimal relief.  She is also using moisturizer once a day.  She denies fevers and chills.  Denies chest pain, shortness of breath, cough, hemoptysis.  Denies nausea, vomiting, constipation, diarrhea.  Denies recent weight loss or night sweats.  Pleurx catheter has been removed since her last visit.  The patient is here for evaluation prior to cycle #3 of Keytruda.  MEDICAL HISTORY: Past Medical History:  Diagnosis Date  . Arrhythmia   . Atrial fibrillation ([place])   . Brain tumor (benign) ([place])   . Depression   . Dyspnea    home oxygen  . Dysrhythmia    a fib  . History of home oxygen therapy 02/2017   2 L/minute  . Hypertension     ALLERGIES:  is allergic to latex; gabapentin; and tape.  MEDICATIONS:  Current Outpatient Medications  Medication Sig  Dispense Refill  . apixaban (ELIQUIS) 5 MG TABS tablet Take 1 tablet (5 mg total) by mouth 2 (two) times daily. (Patient taking differently: Take 5 mg by mouth 2 (two) times daily. ) 180 tablet 3  . Biotin 2.5 MG TABS Take 2,500 mcg by mouth daily.    . diphenhydrAMINE (BENADRYL) 25 mg capsule Take 1 capsule (25 mg total) by mouth at bedtime as needed for sleep. 30 capsule 0  . feeding supplement, ENSURE ENLIVE, (ENSURE ENLIVE) LIQD Take 237 mLs by mouth 2 (two) times daily as needed (If patient or family requests, if pt unable to eat for a meal, or if intakes are poor). 237 mL 12  . hydrOXYzine (ATARAX/VISTARIL) 10 MG tablet Take 1 tablet (10 mg total) by mouth 3 (three) times daily as needed for itching. 30 tablet 0  . levalbuterol (XOPENEX) 0.63 MG/3ML nebulizer solution Take 3 mLs (0.63 mg total) by nebulization every 6 (six) hours as needed for wheezing or shortness of breath. 3 mL 12  . metoprolol tartrate (LOPRESSOR) 50 MG tablet Take 1 tablet (50 mg total) by mouth 2 (two) times daily. (Patient taking differently: Take 50 mg by mouth daily. ) 60 tablet 0  . sertraline (ZOLOFT) 50 MG tablet Take 50 mg by mouth daily.    . clobetasol cream (TEMOVATE) 1.76 % Apply 1 application topically 2 (two) times daily. 60 g 0  . loperamide (IMODIUM) 2 MG capsule Take 2 mg by mouth as needed for diarrhea or loose stools.  No current facility-administered medications for this visit.     SURGICAL HISTORY:  Past Surgical History:  Procedure Laterality Date  . ABDOMINAL HYSTERECTOMY    . BLADDER SURGERY    . BRAIN SURGERY    . ENDOBRONCHIAL ULTRASOUND Bilateral 02/06/2017   Procedure: ENDOBRONCHIAL ULTRASOUND;  Surgeon: Juanito Doom, MD;  Location[facility];  Service: Cardiopulmonary;  Laterality: Bilateral;  . IR GUIDED DRAIN W CATHETER PLACEMENT  06/29/2017  . IR REMOVAL OF PLURAL CATH W/CUFF  08/28/2017  . IR US GUIDE BX ASP/DRAIN  06/29/2017  . VIDEO BRONCHOSCOPY WITH ENDOBRONCHIAL  ULTRASOUND N/A 03/23/2017   Procedure: VIDEO BRONCHOSCOPY WITH ENDOBRONCHIAL ULTRASOUND;  Surgeon: Juanito Doom, MD;  Location: [facility] OR;  Service: Thoracic;  Laterality: N/A;    REVIEW OF SYSTEMS:   Review of Systems  Constitutional: Negative for appetite change, chills, fatigue, fever and unexpected weight change.  HENT:   Negative for mouth sores, nosebleeds, sore throat and trouble swallowing.   Eyes: Negative for eye problems and icterus.  Respiratory: Negative for cough, hemoptysis, shortness of breath and wheezing.   Cardiovascular: Negative for chest pain and leg swelling.  Gastrointestinal: Negative for abdominal pain, constipation, diarrhea, nausea and vomiting.  Genitourinary: Negative for bladder incontinence, difficulty urinating, dysuria, frequency and hematuria.   Musculoskeletal: Negative for back pain, gait problem, neck pain and neck stiffness.  Skin: Positive for itching and dry skin.  Neurological: Negative for dizziness, extremity weakness, gait problem, headaches, light-headedness and seizures.  Hematological: Negative for adenopathy. Does not bruise/bleed easily.  Psychiatric/Behavioral: Negative for confusion, depression and sleep disturbance. The patient is not nervous/anxious.     PHYSICAL EXAMINATION:  Blood pressure 137/77, pulse 88, temperature 98.2 F (36.8 C), temperature source Oral, resp. rate 18, height 5\' 9"  (1.753 m), weight 161 lb 3.2 oz (73.1 kg), SpO2 90 %.  ECOG PERFORMANCE STATUS: 1 - Symptomatic but completely ambulatory  Physical Exam  Constitutional: Oriented to person, place, and time and well-developed, well-nourished, and in no distress. No distress.  HENT:  Head: Normocephalic and atraumatic.  Mouth/Throat: Oropharynx is clear and moist. No oropharyngeal exudate.  Eyes: Conjunctivae are normal. Right eye exhibits no discharge. Left eye exhibits no discharge. No scleral icterus.  Neck: Normal range of motion. Neck supple.   Cardiovascular: Normal rate, regular rhythm, normal heart sounds and intact distal pulses.   Pulmonary/Chest: Effort normal and breath sounds normal. No respiratory distress. No wheezes. No rales.  Abdominal: Soft. Bowel sounds are normal. Exhibits no distension and no mass. There is no tenderness.  Musculoskeletal: Normal range of motion. Exhibits no edema.  Lymphadenopathy:    No cervical adenopathy.  Neurological: Alert and oriented to person, place, and time. Exhibits normal muscle tone. Gait normal. Coordination normal.  Skin: Skin to her upper chest is dry.  No rash noted.  Psychiatric: Mood, memory and judgment normal.  Vitals reviewed.  LABORATORY DATA: Lab Results  Component Value Date   WBC 6.7 09/04/2017   HGB 11.3 (L) 09/04/2017   HCT 34.7 (L) 09/04/2017   MCV 85.5 09/04/2017   PLT 265 09/04/2017      Chemistry      Component Value Date/Time   NA 143 09/04/2017 1303   NA 143 03/15/2017 1436   K 3.6 09/04/2017 1303   K 3.7 03/15/2017 1436   CL 102 09/04/2017 1303   CO2 33 (H) 09/04/2017 1303   CO2 34 (H) 03/15/2017 1436   BUN 12 09/04/2017 1303   BUN 17.8 03/15/2017 1436  CREATININE 0.66 09/04/2017 1303   CREATININE 0.8 03/15/2017 1436      Component Value Date/Time   CALCIUM 9.8 09/04/2017 1303   CALCIUM 9.6 03/15/2017 1436   ALKPHOS 108 09/04/2017 1303   ALKPHOS 88 03/15/2017 1436   AST 19 09/04/2017 1303   AST 16 03/15/2017 1436   ALT 12 09/04/2017 1303   ALT 15 03/15/2017 1436   BILITOT 0.3 09/04/2017 1303   BILITOT 0.52 03/15/2017 1436       RADIOGRAPHIC STUDIES:  Ir Removal Of Plural Cath W/cuff  Result Date: 08/28/2017 CLINICAL DATA:  History malignant right-sided pleural effusion, post ultrasound fluoroscopic guided placement of a palliative PleurX catheter on 06/29/2017 The pleural catheter has worked well until recently as the patient has experienced little to no output during her previous attempts at drainage. Patient is subjectively  improved without recurrence of her preprocedural shortness of breath or chest pain and requests removal of the palliative PleurX catheter. EXAM: IR REMOVAL OF PLEURAL CATH WITH CUFF COMPARISON:  Ultrasound fluoroscopic guided PleurX catheter placement - 06/29/2017 CONTRAST:  None MEDICATIONS: Ancef 2 g IV; antibiotics were administered within an appropriate time frame prior to the initiation of the procedure. ANESTHESIA/SEDATION: Versed 1 mg IV; Fentanyl 50 mcg IV Sedation time: 10 minutes. The patient was continuously monitored during the procedure by the interventional radiology nurse under my direct supervision. FLUOROSCOPY TIME:  12 seconds (2.8 mGy) TECHNIQUE: The patient was positioned supine on the fluoroscopy table. The external portion of the right PleurX catheter as well as the surrounding skin was prepped and draped in usual sterile fashion. A time-out was performed prior to the initiation of the procedure. Preprocedural spot fluoroscopic image was obtained of the right hemithorax. Next, the surrounding skin was anesthetized with 1% lidocaine. Using manual compression, the PleurX catheter was removed intact. A dressing was placed. The patient tolerated the procedure well without immediate postprocedural complication. FINDINGS: Preprocedural spot fluoroscopic image of the right hemithorax demonstrates persistent near complete opacification of the right hemithorax. These findings were discussed at length with the patient and as the PleurX catheter is currently nonfunctional, the decision was made to proceed with catheter removal. The pleural drainage catheter was removed at the patient's bedside without complication. IMPRESSION: 1. Successful bedside removal of palliative tunneled PleurX catheter. 2. Persistent near complete opacification of the right hemithorax, presumably secondary to a combination of residual loculated right-sided pleural fluid and right parenchymal consolidation. Electronically Signed    By: Sandi Mariscal M.D.   On: 08/28/2017 15:09     ASSESSMENT/PLAN:  Stage IV squamous cell carcinoma of right lung [facility]) This is a very pleasant 77 year old white female recently diagnosed with probably stage IV non-small cell lung cancer presented with large right suprahilar lung mass in addition to mediastinal lymphadenopathy and suspicious malignant right pleural effusion.  The patient underwent systemic chemotherapy with carboplatin, paclitaxel and Keytruda status post 3 cycles.  She was tolerating this treatment well with no concerning complaints except for the last cycle when she was admitted to the hospital with C. difficile as well as dehydration and renal insufficiency.  The patient is now on maintenance Keytruda 200 mg IV every 3 weeks.  Status post 2 cycles of maintenance Keytruda.  She is tolerating this fairly well with the exception of itching.   Recommend for her to proceed with cycle 3 of her treatment as scheduled today. She will have a restaging CT scan of the chest, abdomen, pelvis prior to her next visit.  She will follow-up in 3 weeks for evaluation prior to cycle #4 of her treatment and to review her restaging CT scan results.  For the itching, I have encouraged her to use Atarax on a regular basis.  She was also encouraged to increase the use of moisturizer to at least twice a day.  She was given a prescription for clobetasol cream to be used twice a day.  She was advised to call immediately if she has any concerning symptoms in the interval. The patient voices understanding of current disease status and treatment options and is in agreement with the current care plan. All questions were answered. The patient knows to call the clinic with any problems, questions or concerns. We can certainly see the patient much sooner if necessary.   Orders Placed This Encounter  Procedures  . CT CHEST W CONTRAST    Standing Status:   Future    Standing Expiration Date:   09/05/2018     Order Specific Question:   If indicated for the ordered procedure, I authorize the administration of contrast media per Radiology protocol    Answer:   Yes    Order Specific Question:   Preferred imaging location?    Answer:   [facility]    Order Specific Question:   Radiology Contrast Protocol - do NOT remove file path    Answer:   \\[email or web address]    Order Specific Question:   ** REASON FOR EXAM (FREE TEXT)    Answer:   Lung cancer. Restaging.  Marland Kitchen CT ABDOMEN PELVIS W CONTRAST    Standing Status:   Future    Standing Expiration Date:   09/05/2018    Order Specific Question:   If indicated for the ordered procedure, I authorize the administration of contrast media per Radiology protocol    Answer:   Yes    Order Specific Question:   Preferred imaging location?    Answer:   [facility]    Order Specific Question:   Radiology Contrast Protocol - do NOT remove file path    Answer:   \\[email or web address]    Order Specific Question:   ** REASON FOR EXAM (FREE TEXT)    Answer:   Lung cancer. Restaging.  Marland Kitchen CBC with Differential (Cancer Center Only)    Standing Status:   Standing    Number of Occurrences:   20    Standing Expiration Date:   09/05/2018  . CMP ([place] only)    Standing Status:   Standing    Number of Occurrences:   20    Standing Expiration Date:   09/05/2018   Mikey Bussing, [place], AGPCNP-BC, AOCNP 09/04/17

## 2017-09-03 NOTE — Assessment & Plan Note (Addendum)
This is a very pleasant 77 year old white female recently diagnosed with probably stage IV non-small cell lung cancer presented with large right suprahilar lung mass in addition to mediastinal lymphadenopathy and suspicious malignant right pleural effusion.  The patient underwent systemic chemotherapy with carboplatin, paclitaxel and Keytruda status post 3 cycles.  She was tolerating this treatment well with no concerning complaints except for the last cycle when she was admitted to the hospital with C. difficile as well as dehydration and renal insufficiency.  The patient is now on maintenance Keytruda 200 mg IV every 3 weeks.  Status post 2 cycles of maintenance Keytruda.  She is tolerating this fairly well with the exception of itching.   Recommend for her to proceed with cycle 3 of her treatment as scheduled today. She will have a restaging CT scan of the chest, abdomen, pelvis prior to her next visit. She will follow-up in 3 weeks for evaluation prior to cycle #4 of her treatment and to review her restaging CT scan results.  For the itching, I have encouraged her to use Atarax on a regular basis.  She was also encouraged to increase the use of moisturizer to at least twice a day.  She was given a prescription for clobetasol cream to be used twice a day.  She was advised to call immediately if she has any concerning symptoms in the interval. The patient voices understanding of current disease status and treatment options and is in agreement with the current care plan. All questions were answered. The patient knows to call the clinic with any problems, questions or concerns. We can certainly see the patient much sooner if necessary.

## 2017-09-04 ENCOUNTER — Telehealth: Payer: Self-pay | Admitting: Oncology

## 2017-09-04 ENCOUNTER — Other Ambulatory Visit: Payer: Self-pay

## 2017-09-04 ENCOUNTER — Inpatient Hospital Stay: Payer: Medicare Other

## 2017-09-04 ENCOUNTER — Encounter: Payer: Self-pay | Admitting: Oncology

## 2017-09-04 ENCOUNTER — Inpatient Hospital Stay (HOSPITAL_BASED_OUTPATIENT_CLINIC_OR_DEPARTMENT_OTHER): Payer: Medicare Other | Admitting: Oncology

## 2017-09-04 ENCOUNTER — Other Ambulatory Visit: Payer: Medicare Other

## 2017-09-04 VITALS — BP 137/77 | HR 88 | Temp 98.2°F | Resp 18 | Ht 69.0 in | Wt 161.2 lb

## 2017-09-04 DIAGNOSIS — L299 Pruritus, unspecified: Secondary | ICD-10-CM | POA: Diagnosis not present

## 2017-09-04 DIAGNOSIS — J910 Malignant pleural effusion: Secondary | ICD-10-CM | POA: Diagnosis not present

## 2017-09-04 DIAGNOSIS — Z5112 Encounter for antineoplastic immunotherapy: Secondary | ICD-10-CM

## 2017-09-04 DIAGNOSIS — C3491 Malignant neoplasm of unspecified part of right bronchus or lung: Secondary | ICD-10-CM

## 2017-09-04 DIAGNOSIS — R5382 Chronic fatigue, unspecified: Secondary | ICD-10-CM

## 2017-09-04 DIAGNOSIS — C382 Malignant neoplasm of posterior mediastinum: Secondary | ICD-10-CM

## 2017-09-04 DIAGNOSIS — Z79899 Other long term (current) drug therapy: Secondary | ICD-10-CM | POA: Diagnosis not present

## 2017-09-04 LAB — CMP (CANCER CENTER ONLY)
ALT: 12 U/L (ref 0–44)
ANION GAP: 8 (ref 5–15)
AST: 19 U/L (ref 15–41)
Albumin: 3 g/dL — ABNORMAL LOW (ref 3.5–5.0)
Alkaline Phosphatase: 108 U/L (ref 38–126)
BUN: 12 mg/dL (ref 8–23)
CALCIUM: 9.8 mg/dL (ref 8.9–10.3)
CO2: 33 mmol/L — ABNORMAL HIGH (ref 22–32)
CREATININE: 0.66 mg/dL (ref 0.44–1.00)
Chloride: 102 mmol/L (ref 98–111)
Glucose, Bld: 97 mg/dL (ref 70–99)
Potassium: 3.6 mmol/L (ref 3.5–5.1)
Sodium: 143 mmol/L (ref 135–145)
Total Bilirubin: 0.3 mg/dL (ref 0.3–1.2)
Total Protein: 7.3 g/dL (ref 6.5–8.1)

## 2017-09-04 LAB — CBC WITH DIFFERENTIAL (CANCER CENTER ONLY)
BASOS PCT: 1 %
Basophils Absolute: 0 10*3/uL (ref 0.0–0.1)
EOS ABS: 0.1 10*3/uL (ref 0.0–0.5)
Eosinophils Relative: 2 %
HCT: 34.7 % — ABNORMAL LOW (ref 34.8–46.6)
HEMOGLOBIN: 11.3 g/dL — AB (ref 11.6–15.9)
Lymphocytes Relative: 13 %
Lymphs Abs: 0.9 10*3/uL (ref 0.9–3.3)
MCH: 27.9 pg (ref 25.1–34.0)
MCHC: 32.6 g/dL (ref 31.5–36.0)
MCV: 85.5 fL (ref 79.5–101.0)
Monocytes Absolute: 0.7 10*3/uL (ref 0.1–0.9)
Monocytes Relative: 10 %
NEUTROS ABS: 5 10*3/uL (ref 1.5–6.5)
NEUTROS PCT: 74 %
Platelet Count: 265 10*3/uL (ref 145–400)
RBC: 4.06 MIL/uL (ref 3.70–5.45)
RDW: 17.1 % — ABNORMAL HIGH (ref 11.2–14.5)
WBC: 6.7 10*3/uL (ref 3.9–10.3)

## 2017-09-04 LAB — TSH: TSH: 3.35 u[IU]/mL (ref 0.308–3.960)

## 2017-09-04 MED ORDER — SODIUM CHLORIDE 0.9 % IV SOLN
Freq: Once | INTRAVENOUS | Status: AC
  Administered 2017-09-04: 15:00:00 via INTRAVENOUS

## 2017-09-04 MED ORDER — CLOBETASOL PROPIONATE 0.05 % EX CREA: 1.0000 "application " | TOPICAL_CREAM | Freq: Two times a day (BID) | CUTANEOUS | 0 refills | Status: AC

## 2017-09-04 MED ORDER — SODIUM CHLORIDE 0.9 % IV SOLN
200.0000 mg | Freq: Once | INTRAVENOUS | Status: AC
  Administered 2017-09-04: 200 mg via INTRAVENOUS
  Filled 2017-09-04: qty 8

## 2017-09-04 NOTE — Telephone Encounter (Signed)
Scheduled appt per 6/25 los - gave patient AVS and calender per los. - central radiology to contact patient with ct.

## 2017-09-05 DIAGNOSIS — C3491 Malignant neoplasm of unspecified part of right bronchus or lung: Secondary | ICD-10-CM | POA: Diagnosis not present

## 2017-09-05 DIAGNOSIS — I119 Hypertensive heart disease without heart failure: Secondary | ICD-10-CM | POA: Diagnosis not present

## 2017-09-06 ENCOUNTER — Telehealth: Payer: Self-pay | Admitting: Medical Oncology

## 2017-09-06 DIAGNOSIS — C382 Malignant neoplasm of posterior mediastinum: Secondary | ICD-10-CM | POA: Diagnosis not present

## 2017-09-06 DIAGNOSIS — I10 Essential (primary) hypertension: Secondary | ICD-10-CM | POA: Diagnosis not present

## 2017-09-06 DIAGNOSIS — C3490 Malignant neoplasm of unspecified part of unspecified bronchus or lung: Secondary | ICD-10-CM | POA: Diagnosis not present

## 2017-09-06 DIAGNOSIS — J90 Pleural effusion, not elsewhere classified: Secondary | ICD-10-CM | POA: Diagnosis not present

## 2017-09-06 DIAGNOSIS — F329 Major depressive disorder, single episode, unspecified: Secondary | ICD-10-CM | POA: Diagnosis not present

## 2017-09-06 DIAGNOSIS — I4891 Unspecified atrial fibrillation: Secondary | ICD-10-CM | POA: Diagnosis not present

## 2017-09-06 NOTE — Telephone Encounter (Signed)
PT discharged from Home health due to no skilled needs. Pleurix removed 1 week ago and pt doing well.

## 2017-09-12 ENCOUNTER — Telehealth: Payer: Self-pay | Admitting: Internal Medicine

## 2017-09-12 NOTE — Telephone Encounter (Signed)
Tried to call regarding schedule °

## 2017-09-21 ENCOUNTER — Ambulatory Visit (HOSPITAL_COMMUNITY)
Admission: RE | Admit: 2017-09-21 | Discharge: 2017-09-21 | Disposition: A | Discharge status: Home / Self Care | Payer: Medicare Other | Source: Ambulatory Visit | Attending: Oncology | Admitting: Oncology

## 2017-09-21 DIAGNOSIS — I289 Disease of pulmonary vessels, unspecified: Secondary | ICD-10-CM | POA: Diagnosis not present

## 2017-09-21 DIAGNOSIS — R160 Hepatomegaly, not elsewhere classified: Secondary | ICD-10-CM | POA: Insufficient documentation

## 2017-09-21 DIAGNOSIS — K8020 Calculus of gallbladder without cholecystitis without obstruction: Secondary | ICD-10-CM | POA: Diagnosis not present

## 2017-09-21 DIAGNOSIS — C3491 Malignant neoplasm of unspecified part of right bronchus or lung: Secondary | ICD-10-CM | POA: Insufficient documentation

## 2017-09-21 DIAGNOSIS — J90 Pleural effusion, not elsewhere classified: Secondary | ICD-10-CM | POA: Insufficient documentation

## 2017-09-21 DIAGNOSIS — D3501 Benign neoplasm of right adrenal gland: Secondary | ICD-10-CM | POA: Insufficient documentation

## 2017-09-21 DIAGNOSIS — D3502 Benign neoplasm of left adrenal gland: Secondary | ICD-10-CM | POA: Diagnosis not present

## 2017-09-21 DIAGNOSIS — I700 Atherosclerosis of aorta: Secondary | ICD-10-CM | POA: Insufficient documentation

## 2017-09-21 DIAGNOSIS — I77819 Aortic ectasia, unspecified site: Secondary | ICD-10-CM | POA: Insufficient documentation

## 2017-09-21 DIAGNOSIS — C3490 Malignant neoplasm of unspecified part of unspecified bronchus or lung: Secondary | ICD-10-CM | POA: Diagnosis not present

## 2017-09-21 DIAGNOSIS — I7789 Other specified disorders of arteries and arterioles: Secondary | ICD-10-CM | POA: Diagnosis not present

## 2017-09-21 DIAGNOSIS — Z5111 Encounter for antineoplastic chemotherapy: Secondary | ICD-10-CM | POA: Diagnosis not present

## 2017-09-21 MED ORDER — IOPAMIDOL (ISOVUE-300) INJECTION 61%
INTRAVENOUS | Status: AC
  Filled 2017-09-21: qty 100

## 2017-09-21 MED ORDER — IOPAMIDOL (ISOVUE-300) INJECTION 61%
100.0000 mL | Freq: Once | INTRAVENOUS | Status: AC | PRN
  Administered 2017-09-21: 100 mL via INTRAVENOUS

## 2017-09-25 ENCOUNTER — Inpatient Hospital Stay: Payer: Medicare Other

## 2017-09-25 ENCOUNTER — Telehealth: Payer: Self-pay | Admitting: Oncology

## 2017-09-25 ENCOUNTER — Inpatient Hospital Stay: Payer: Medicare Other | Attending: Internal Medicine | Admitting: Oncology

## 2017-09-25 ENCOUNTER — Encounter: Payer: Self-pay | Admitting: Oncology

## 2017-09-25 VITALS — BP 129/79 | HR 121 | Temp 98.7°F | Resp 18 | Ht 69.0 in | Wt 160.8 lb

## 2017-09-25 DIAGNOSIS — C382 Malignant neoplasm of posterior mediastinum: Secondary | ICD-10-CM

## 2017-09-25 DIAGNOSIS — R0602 Shortness of breath: Secondary | ICD-10-CM

## 2017-09-25 DIAGNOSIS — C3491 Malignant neoplasm of unspecified part of right bronchus or lung: Secondary | ICD-10-CM

## 2017-09-25 DIAGNOSIS — R630 Anorexia: Secondary | ICD-10-CM | POA: Diagnosis not present

## 2017-09-25 DIAGNOSIS — Z79899 Other long term (current) drug therapy: Secondary | ICD-10-CM | POA: Diagnosis not present

## 2017-09-25 DIAGNOSIS — Z9981 Dependence on supplemental oxygen: Secondary | ICD-10-CM | POA: Insufficient documentation

## 2017-09-25 DIAGNOSIS — R5382 Chronic fatigue, unspecified: Secondary | ICD-10-CM

## 2017-09-25 DIAGNOSIS — R05 Cough: Secondary | ICD-10-CM | POA: Diagnosis not present

## 2017-09-25 DIAGNOSIS — J90 Pleural effusion, not elsewhere classified: Secondary | ICD-10-CM | POA: Diagnosis not present

## 2017-09-25 DIAGNOSIS — Z5112 Encounter for antineoplastic immunotherapy: Secondary | ICD-10-CM | POA: Diagnosis not present

## 2017-09-25 LAB — CMP (CANCER CENTER ONLY)
ALBUMIN: 2.8 g/dL — AB (ref 3.5–5.0)
ALT: 10 U/L (ref 0–44)
ANION GAP: 9 (ref 5–15)
AST: 13 U/L — ABNORMAL LOW (ref 15–41)
Alkaline Phosphatase: 120 U/L (ref 38–126)
BUN: 12 mg/dL (ref 8–23)
CO2: 33 mmol/L — AB (ref 22–32)
Calcium: 9.7 mg/dL (ref 8.9–10.3)
Chloride: 100 mmol/L (ref 98–111)
Creatinine: 0.65 mg/dL (ref 0.44–1.00)
GFR, Estimated: >60 mL/min (ref >60)
GLUCOSE: 126 mg/dL — AB (ref 70–99)
POTASSIUM: 3.5 mmol/L (ref 3.5–5.1)
SODIUM: 142 mmol/L (ref 135–145)
Total Bilirubin: 0.3 mg/dL (ref 0.3–1.2)
Total Protein: 7.5 g/dL (ref 6.5–8.1)

## 2017-09-25 LAB — CBC WITH DIFFERENTIAL (CANCER CENTER ONLY)
BASOS ABS: 0 10*3/uL (ref 0.0–0.1)
Basophils Relative: 1 %
Eosinophils Absolute: 0.1 10*3/uL (ref 0.0–0.5)
Eosinophils Relative: 1 %
HCT: 33.2 % — ABNORMAL LOW (ref 34.8–46.6)
HEMOGLOBIN: 10.7 g/dL — AB (ref 11.6–15.9)
LYMPHS PCT: 8 %
Lymphs Abs: 0.6 10*3/uL — ABNORMAL LOW (ref 0.9–3.3)
MCH: 27 pg (ref 25.1–34.0)
MCHC: 32.3 g/dL (ref 31.5–36.0)
MCV: 83.5 fL (ref 79.5–101.0)
MONO ABS: 0.6 10*3/uL (ref 0.1–0.9)
MONOS PCT: 9 %
NEUTROS ABS: 6 10*3/uL (ref 1.5–6.5)
NEUTROS PCT: 81 %
Platelet Count: 289 10*3/uL (ref 145–400)
RBC: 3.98 MIL/uL (ref 3.70–5.45)
RDW: 15.9 % — ABNORMAL HIGH (ref 11.2–14.5)
WBC Count: 7.4 10*3/uL (ref 3.9–10.3)

## 2017-09-25 LAB — TSH: TSH: 8.251 u[IU]/mL — ABNORMAL HIGH (ref 0.308–3.960)

## 2017-09-25 MED ORDER — SODIUM CHLORIDE 0.9 % IV SOLN
200.0000 mg | Freq: Once | INTRAVENOUS | Status: AC
  Administered 2017-09-25: 200 mg via INTRAVENOUS
  Filled 2017-09-25: qty 8

## 2017-09-25 MED ORDER — SODIUM CHLORIDE 0.9 % IV SOLN
Freq: Once | INTRAVENOUS | Status: AC
  Administered 2017-09-25: 15:00:00 via INTRAVENOUS

## 2017-09-25 NOTE — Assessment & Plan Note (Signed)
This is a very pleasant 77 year old white female recently diagnosed with probably stage IV non-small cell lung cancer presented with large right suprahilar lung mass in addition to mediastinal lymphadenopathy and suspicious malignant right pleural effusion.  The patientunderwentsystemic chemotherapy with carboplatin, paclitaxel and Keytruda status post 3cycles. She wastolerating this treatment well with no concerning complaints except for the last cycle when she was admitted to the hospital with C. difficile as well as dehydration and renal insufficiency.  The patient is now on maintenance Keytruda 200 mg IV every 3 weeks.  Status post 3 cycles of maintenance Keytruda.  She is tolerating this fairly well. The patient had a restaging CT scan which showed no evidence of disease progression.  Images were reviewed with the patient and her daughter.  The patient has a large right pleural effusion which could be contributing to her shortness of breath.  Recommend for the patient to undergo a thoracentesis.  This will be done an ultrasound within the next 24 to 48 hours. Recommend for the patient to continue on Keytruda every 3 weeks.  She will proceed with cycle 4 today as scheduled.  The patient will follow-up in 3 weeks for evaluation prior to cycle #5.  She was advised to call immediately if she has any concerning symptoms in the interval. The patient voices understanding of current disease status and treatment options and is in agreement with the current care plan. All questions were answered. The patient knows to call the clinic with any problems, questions or concerns. We can certainly see the patient much sooner if necessary.

## 2017-09-25 NOTE — Progress Notes (Signed)
Per Erasmo Downer Curcio-NP: OK to treat with elevated heart rate

## 2017-09-25 NOTE — Progress Notes (Signed)
[facility] OFFICE PROGRESS NOTE  Cassidy Panda, MD [address] [place] [address]  DIAGNOSIS: Metastatic non-small cell carcinoma (T3, N2, M1a) non-small cell lung cancer diagnosed in November 2018 and presented with large right suprahilar and mediastinal mass as well as recent right pleural effusion.  Guardant 360:No actionable mutations.  PRIOR THERAPY: 1)Palliative radiotherapy to the large mediastinal mass under the care of Dr. Lisbeth Renshaw. 2)Systemic chemotherapy with carboplatin for AUC of 5, paclitaxel 175 mg/M2 and Keytruda 200 mg IV every 3 weeks. First dose expected May 01, 2017. Status post 3cycles.  CURRENT THERAPY: Maintenance treatment with immunotherapy with single agent Keytruda 200 mg IV every 3 weeks. First cycle 07/24/2017.  Status post 3 cycles.  INTERVAL HISTORY: Cassidy Sanders 77 y.o. female returns for routine follow-up visit accompanied by her daughter.  The patient that she been having more shortness of breath for the past week.  She is currently on oxygen at 5 L/min.  She was previously on oxygen at 2 L/min.  She denies fevers and chills.  Denies chest pain and hemoptysis.  She has an ongoing cough which is unchanged.  Denies nausea, vomiting, constipation, diarrhea.  She reports a decreased appetite but she has not lost any weight.  Patient has been tolerating treatment with Keytruda fairly well.  The patient had a restaging CT scan and is here to discuss the results.  MEDICAL HISTORY: Past Medical History:  Diagnosis Date  . Arrhythmia   . Atrial fibrillation ([place])   . Brain tumor (benign) ([place])   . Depression   . Dyspnea    home oxygen  . Dysrhythmia    a fib  . History of home oxygen therapy 02/2017   2 L/minute  . Hypertension     ALLERGIES:  is allergic to latex; gabapentin; and tape.  MEDICATIONS:  Current Outpatient Medications  Medication Sig Dispense Refill  . apixaban (ELIQUIS) 5 MG TABS tablet Take 1 tablet  (5 mg total) by mouth 2 (two) times daily. 180 tablet 3  . Biotin 2.5 MG TABS Take 2,500 mcg by mouth daily.    . clobetasol cream (TEMOVATE) 5.46 % Apply 1 application topically 2 (two) times daily. 60 g 0  . diphenhydrAMINE (BENADRYL) 25 mg capsule Take 1 capsule (25 mg total) by mouth at bedtime as needed for sleep. 30 capsule 0  . feeding supplement, ENSURE ENLIVE, (ENSURE ENLIVE) LIQD Take 237 mLs by mouth 2 (two) times daily as needed (If patient or family requests, if pt unable to eat for a meal, or if intakes are poor). 237 mL 12  . hydrOXYzine (ATARAX/VISTARIL) 10 MG tablet Take 1 tablet (10 mg total) by mouth 3 (three) times daily as needed for itching. 30 tablet 0  . levalbuterol (XOPENEX) 0.63 MG/3ML nebulizer solution Take 3 mLs (0.63 mg total) by nebulization every 6 (six) hours as needed for wheezing or shortness of breath. 3 mL 12  . loperamide (IMODIUM) 2 MG capsule Take 2 mg by mouth as needed for diarrhea or loose stools.    . metoprolol tartrate (LOPRESSOR) 50 MG tablet Take 1 tablet (50 mg total) by mouth 2 (two) times daily. (Patient taking differently: Take 25 mg by mouth daily. ) 60 tablet 0  . sertraline (ZOLOFT) 50 MG tablet Take 50 mg by mouth daily.    Marland Kitchen oxyCODONE (OXY IR/ROXICODONE) 5 MG immediate release tablet Take 1 tablet by mouth every 2 (two) hours as needed.   0   No current facility-administered  medications for this visit.    Facility-Administered Medications Ordered in Other Visits  Medication Dose Route Frequency Provider Last Rate Last Dose  . pembrolizumab (KEYTRUDA) 200 mg in sodium chloride 0.9 % 50 mL chemo infusion  200 mg Intravenous Once Curt Bears, MD 116 mL/hr at 09/25/17 1544 200 mg at 09/25/17 1544    SURGICAL HISTORY:  Past Surgical History:  Procedure Laterality Date  . ABDOMINAL HYSTERECTOMY    . BLADDER SURGERY    . BRAIN SURGERY    . ENDOBRONCHIAL ULTRASOUND Bilateral 02/06/2017   Procedure: ENDOBRONCHIAL ULTRASOUND;  Surgeon:  Juanito Doom, MD;  Location: [facility];  Service: Cardiopulmonary;  Laterality: Bilateral;  . IR GUIDED DRAIN W CATHETER PLACEMENT  06/29/2017  . IR REMOVAL OF PLURAL CATH W/CUFF  08/28/2017  . IR US GUIDE BX ASP/DRAIN  06/29/2017  . VIDEO BRONCHOSCOPY WITH ENDOBRONCHIAL ULTRASOUND N/A 03/23/2017   Procedure: VIDEO BRONCHOSCOPY WITH ENDOBRONCHIAL ULTRASOUND;  Surgeon: Juanito Doom, MD;  Location: [facility] OR;  Service: Thoracic;  Laterality: N/A;    REVIEW OF SYSTEMS:   Review of Systems  Constitutional: Negative for chills, fatigue, fever and unexpected weight change. Positive for decreased appetite. HENT:   Negative for mouth sores, nosebleeds, sore throat and trouble swallowing.   Eyes: Negative for eye problems and icterus.  Respiratory: Negative for hemoptysis and wheezing.  Positive for cough and increasing shortness of breath.  Cardiovascular: Negative for chest pain and leg swelling.  Gastrointestinal: Negative for abdominal pain, constipation, diarrhea, nausea and vomiting.  Genitourinary: Negative for bladder incontinence, difficulty urinating, dysuria, frequency and hematuria.   Musculoskeletal: Negative for back pain, neck pain and neck stiffness.  Skin: Negative for itching and rash.  Neurological: Negative for dizziness, extremity weakness, headaches, light-headedness and seizures.  Hematological: Negative for adenopathy. Does not bruise/bleed easily.  Psychiatric/Behavioral: Negative for confusion, depression and sleep disturbance. The patient is not nervous/anxious.     PHYSICAL EXAMINATION:  Blood pressure 129/79, pulse (!) 121, temperature 98.7 F (37.1 C), temperature source Oral, resp. rate 18, height 5\' 9"  (1.753 m), weight 160 lb 12.8 oz (72.9 kg), SpO2 90 %.  ECOG PERFORMANCE STATUS: 1 - Symptomatic but completely ambulatory  Physical Exam  Constitutional: Oriented to person, place, and time and well-developed, well-nourished, and in no distress. No  distress.  HENT:  Head: Normocephalic and atraumatic.  Mouth/Throat: Oropharynx is clear and moist. No oropharyngeal exudate.  Eyes: Conjunctivae are normal. Right eye exhibits no discharge. Left eye exhibits no discharge. No scleral icterus.  Neck: Normal range of motion. Neck supple.  Cardiovascular: Tachycardic, regular rhythm, normal heart sounds and intact distal pulses.   Pulmonary/Chest: Effort normal.diminished breath sounds from the right mid lung to the right base. No respiratory distress. No wheezes. No rales.  Abdominal: Soft. Bowel sounds are normal. Exhibits no distension and no mass. There is no tenderness.  Musculoskeletal: Normal range of motion. Exhibits no edema.  Lymphadenopathy:    No cervical adenopathy.  Neurological: Alert and oriented to person, place, and time. Exhibits normal muscle tone. Coordination normal.  Skin: Skin is warm and dry. No rash noted. Not diaphoretic. No erythema. No pallor.  Psychiatric: Mood, memory and judgment normal.  Vitals reviewed.  LABORATORY DATA: Lab Results  Component Value Date   WBC 7.4 09/25/2017   HGB 10.7 (L) 09/25/2017   HCT 33.2 (L) 09/25/2017   MCV 83.5 09/25/2017   PLT 289 09/25/2017      Chemistry      Component Value Date/Time  NA 142 09/25/2017 1349   NA 143 03/15/2017 1436   K 3.5 09/25/2017 1349   K 3.7 03/15/2017 1436   CL 100 09/25/2017 1349   CO2 33 (H) 09/25/2017 1349   CO2 34 (H) 03/15/2017 1436   BUN 12 09/25/2017 1349   BUN 17.8 03/15/2017 1436   CREATININE 0.65 09/25/2017 1349   CREATININE 0.8 03/15/2017 1436      Component Value Date/Time   CALCIUM 9.7 09/25/2017 1349   CALCIUM 9.6 03/15/2017 1436   ALKPHOS 120 09/25/2017 1349   ALKPHOS 88 03/15/2017 1436   AST 13 (L) 09/25/2017 1349   AST 16 03/15/2017 1436   ALT 10 09/25/2017 1349   ALT 15 03/15/2017 1436   BILITOT 0.3 09/25/2017 1349   BILITOT 0.52 03/15/2017 1436       RADIOGRAPHIC STUDIES:  Ct Chest W Contrast  Result  Date: 09/21/2017 CLINICAL DATA:  Lung cancer on maintenance chemotherapy. Previous radiation therapy. EXAM: CT CHEST, ABDOMEN, AND PELVIS WITH CONTRAST TECHNIQUE: Multidetector CT imaging of the chest, abdomen and pelvis was performed following the standard protocol during bolus administration of intravenous contrast. CONTRAST:  172mL ISOVUE-300 IOPAMIDOL (ISOVUE-300) INJECTION 61% COMPARISON:  CT chest 06/27/2017, PET 03/12/2017 and CT abdomen pelvis 02/01/2017. FINDINGS: CT CHEST FINDINGS Cardiovascular: Atherosclerotic calcification of the arterial vasculature, including three-vessel involvement of coronary arteries and aortic valve. Pulmonary arteries and heart are enlarged. No pericardial effusion. Mediastinum/Nodes: Low-attenuation thyroid nodules measure up to 1.6 cm on the right, similar. No discrete mediastinal adenopathy. There is soft tissue thickening along the lower trachea and right mainstem bronchus. No axillary adenopathy. Esophagus is grossly unremarkable. Lungs/Pleura: Slight improvement in aeration in the right upper lobe with residual nodularity, consolidation, bronchiectasis and ground-glass, presumably related to radiation therapy. Complete collapse of the right middle and right lower lobes. Large right pleural effusion, similar. Probable evolving radiation changes in the medial left upper lobe. Mild scarring in the lingula. Tiny left pleural effusion. Scattered dependent subpleural atelectasis in the left lower lobe. Narrowing of the right upper and right middle lobe bronchi. Airway is otherwise unremarkable. Musculoskeletal: Degenerative changes in the spine. No worrisome lytic or sclerotic lesions. T12 compression fracture is unchanged. CT ABDOMEN PELVIS FINDINGS Hepatobiliary: Liver may be slightly decreased in attenuation diffusely and measures approximately 20 cm. Stones layer in the gallbladder. No biliary ductal dilatation. Pancreas: Negative. Spleen: Negative. Adrenals/Urinary Tract:  Right adrenal nodule measures 1.5 cm and left adrenal nodule, 2.0 cm, stable from 03/12/2017, at which time they were shown to be fluid in density. Subcentimeter low-attenuation lesions in the kidneys are too small to characterize. Ureters are decompressed. Bladder is grossly unremarkable. Stomach/Bowel: Stomach, small bowel and colon are unremarkable. Appendix not readily visualized. Vascular/Lymphatic: Atherosclerotic calcification of the arterial vasculature. Infrarenal aorta measures up to 2.7 cm. No pathologically enlarged lymph nodes. Reproductive: Hysterectomy.  No adnexal mass. Other: No free fluid.  Mesenteries and peritoneum are unremarkable. Musculoskeletal: Degenerative changes in the spine. No worrisome lytic or sclerotic lesions. IMPRESSION: 1. Interval improvement in aeration in the right upper lobe, with presumed evolving radiation therapy effect. 2. Large right pleural effusion with right middle and right lower lobe collapse, similar. 3. Small left pleural effusion. 4. Enlarged pulmonary arteries, indicative of pulmonary arterial hypertension. 5. Atherosclerotic calcification of the arterial vasculature, including coronary arteries. 6. Ectatic abdominal aorta at risk for aneurysm development. Recommend followup by ultrasound in 5 years. This recommendation follows ACR consensus guidelines: White Paper of the ACR Incidental Findings Committee II on  Vascular Findings. J Am Coll Radiol 2013; 10:789-794. 7. Hepatomegaly.  Liver may be steatotic. 8. Cholelithiasis. 9. Bilateral adrenal adenomas. Electronically Signed   By: Lorin Picket M.D.   On: 09/21/2017 12:44   Ct Abdomen Pelvis W Contrast  Result Date: 09/21/2017 CLINICAL DATA:  Lung cancer on maintenance chemotherapy. Previous radiation therapy. EXAM: CT CHEST, ABDOMEN, AND PELVIS WITH CONTRAST TECHNIQUE: Multidetector CT imaging of the chest, abdomen and pelvis was performed following the standard protocol during bolus administration of  intravenous contrast. CONTRAST:  133mL ISOVUE-300 IOPAMIDOL (ISOVUE-300) INJECTION 61% COMPARISON:  CT chest 06/27/2017, PET 03/12/2017 and CT abdomen pelvis 02/01/2017. FINDINGS: CT CHEST FINDINGS Cardiovascular: Atherosclerotic calcification of the arterial vasculature, including three-vessel involvement of coronary arteries and aortic valve. Pulmonary arteries and heart are enlarged. No pericardial effusion. Mediastinum/Nodes: Low-attenuation thyroid nodules measure up to 1.6 cm on the right, similar. No discrete mediastinal adenopathy. There is soft tissue thickening along the lower trachea and right mainstem bronchus. No axillary adenopathy. Esophagus is grossly unremarkable. Lungs/Pleura: Slight improvement in aeration in the right upper lobe with residual nodularity, consolidation, bronchiectasis and ground-glass, presumably related to radiation therapy. Complete collapse of the right middle and right lower lobes. Large right pleural effusion, similar. Probable evolving radiation changes in the medial left upper lobe. Mild scarring in the lingula. Tiny left pleural effusion. Scattered dependent subpleural atelectasis in the left lower lobe. Narrowing of the right upper and right middle lobe bronchi. Airway is otherwise unremarkable. Musculoskeletal: Degenerative changes in the spine. No worrisome lytic or sclerotic lesions. T12 compression fracture is unchanged. CT ABDOMEN PELVIS FINDINGS Hepatobiliary: Liver may be slightly decreased in attenuation diffusely and measures approximately 20 cm. Stones layer in the gallbladder. No biliary ductal dilatation. Pancreas: Negative. Spleen: Negative. Adrenals/Urinary Tract: Right adrenal nodule measures 1.5 cm and left adrenal nodule, 2.0 cm, stable from 03/12/2017, at which time they were shown to be fluid in density. Subcentimeter low-attenuation lesions in the kidneys are too small to characterize. Ureters are decompressed. Bladder is grossly unremarkable.  Stomach/Bowel: Stomach, small bowel and colon are unremarkable. Appendix not readily visualized. Vascular/Lymphatic: Atherosclerotic calcification of the arterial vasculature. Infrarenal aorta measures up to 2.7 cm. No pathologically enlarged lymph nodes. Reproductive: Hysterectomy.  No adnexal mass. Other: No free fluid.  Mesenteries and peritoneum are unremarkable. Musculoskeletal: Degenerative changes in the spine. No worrisome lytic or sclerotic lesions. IMPRESSION: 1. Interval improvement in aeration in the right upper lobe, with presumed evolving radiation therapy effect. 2. Large right pleural effusion with right middle and right lower lobe collapse, similar. 3. Small left pleural effusion. 4. Enlarged pulmonary arteries, indicative of pulmonary arterial hypertension. 5. Atherosclerotic calcification of the arterial vasculature, including coronary arteries. 6. Ectatic abdominal aorta at risk for aneurysm development. Recommend followup by ultrasound in 5 years. This recommendation follows ACR consensus guidelines: White Paper of the ACR Incidental Findings Committee II on Vascular Findings. J Am Coll Radiol 2013; 10:789-794. 7. Hepatomegaly.  Liver may be steatotic. 8. Cholelithiasis. 9. Bilateral adrenal adenomas. Electronically Signed   By: Lorin Picket M.D.   On: 09/21/2017 12:44   Ir Removal Of Plural Cath W/cuff  Result Date: 08/28/2017 CLINICAL DATA:  History malignant right-sided pleural effusion, post ultrasound fluoroscopic guided placement of a palliative PleurX catheter on 06/29/2017 The pleural catheter has worked well until recently as the patient has experienced little to no output during her previous attempts at drainage. Patient is subjectively improved without recurrence of her preprocedural shortness of breath or chest pain and  requests removal of the palliative PleurX catheter. EXAM: IR REMOVAL OF PLEURAL CATH WITH CUFF COMPARISON:  Ultrasound fluoroscopic guided PleurX catheter  placement - 06/29/2017 CONTRAST:  None MEDICATIONS: Ancef 2 g IV; antibiotics were administered within an appropriate time frame prior to the initiation of the procedure. ANESTHESIA/SEDATION: Versed 1 mg IV; Fentanyl 50 mcg IV Sedation time: 10 minutes. The patient was continuously monitored during the procedure by the interventional radiology nurse under my direct supervision. FLUOROSCOPY TIME:  12 seconds (2.8 mGy) TECHNIQUE: The patient was positioned supine on the fluoroscopy table. The external portion of the right PleurX catheter as well as the surrounding skin was prepped and draped in usual sterile fashion. A time-out was performed prior to the initiation of the procedure. Preprocedural spot fluoroscopic image was obtained of the right hemithorax. Next, the surrounding skin was anesthetized with 1% lidocaine. Using manual compression, the PleurX catheter was removed intact. A dressing was placed. The patient tolerated the procedure well without immediate postprocedural complication. FINDINGS: Preprocedural spot fluoroscopic image of the right hemithorax demonstrates persistent near complete opacification of the right hemithorax. These findings were discussed at length with the patient and as the PleurX catheter is currently nonfunctional, the decision was made to proceed with catheter removal. The pleural drainage catheter was removed at the patient's bedside without complication. IMPRESSION: 1. Successful bedside removal of palliative tunneled PleurX catheter. 2. Persistent near complete opacification of the right hemithorax, presumably secondary to a combination of residual loculated right-sided pleural fluid and right parenchymal consolidation. Electronically Signed   By: Sandi Mariscal M.D.   On: 08/28/2017 15:09     ASSESSMENT/PLAN:  Stage IV squamous cell carcinoma of right lung [facility]) This is a very pleasant 77 year old white female recently diagnosed with probably stage IV non-small cell lung  cancer presented with large right suprahilar lung mass in addition to mediastinal lymphadenopathy and suspicious malignant right pleural effusion.  The patientunderwentsystemic chemotherapy with carboplatin, paclitaxel and Keytruda status post 3cycles. She wastolerating this treatment well with no concerning complaints except for the last cycle when she was admitted to the hospital with C. difficile as well as dehydration and renal insufficiency.  The patient is now on maintenance Keytruda 200 mg IV every 3 weeks.  Status post 3 cycles of maintenance Keytruda.  She is tolerating this fairly well. The patient had a restaging CT scan which showed no evidence of disease progression.  Images were reviewed with the patient and her daughter.  The patient has a large right pleural effusion which could be contributing to her shortness of breath.  Recommend for the patient to undergo a thoracentesis.  This will be done an ultrasound within the next 24 to 48 hours. Recommend for the patient to continue on Keytruda every 3 weeks.  She will proceed with cycle 4 today as scheduled.  The patient will follow-up in 3 weeks for evaluation prior to cycle #5.  She was advised to call immediately if she has any concerning symptoms in the interval. The patient voices understanding of current disease status and treatment options and is in agreement with the current care plan. All questions were answered. The patient knows to call the clinic with any problems, questions or concerns. We can certainly see the patient much sooner if necessary.   Orders Placed This Encounter  Procedures  . US THORACENTESIS ASP PLEURAL SPACE W/IMG GUIDE    Standing Status:   Future    Standing Expiration Date:   11/25/2018  Scheduling Instructions:     Contact daughter: Einar Pheasant [phone]    Order Specific Question:   Reason for exam:    Answer:   Lung cancer. Large Pleural effusion with increased shortness of breath. Hx  of R PleurX catheter.    Order Specific Question:   Preferred imaging location?    Answer:   [place], [place], AGPCNP-BC, [place] 09/25/17

## 2017-09-25 NOTE — Telephone Encounter (Signed)
3 cycles already scheduled per 7/16 los. - no additional appts added.

## 2017-09-25 NOTE — Patient Instructions (Signed)
[place] Discharge Instructions for Patients Receiving Chemotherapy  Today you received the following chemotherapy agents: Pembrolizumab Beryle Flock)  To help prevent nausea and vomiting after your treatment, we encourage you to take your nausea medication as prescribed.    If you develop nausea and vomiting that is not controlled by your nausea medication, call the clinic.   BELOW ARE SYMPTOMS THAT SHOULD BE REPORTED IMMEDIATELY:  *FEVER GREATER THAN 100.5 F  *CHILLS WITH OR WITHOUT FEVER  NAUSEA AND VOMITING THAT IS NOT CONTROLLED WITH YOUR NAUSEA MEDICATION  *UNUSUAL SHORTNESS OF BREATH  *UNUSUAL BRUISING OR BLEEDING  TENDERNESS IN MOUTH AND THROAT WITH OR WITHOUT PRESENCE OF ULCERS  *URINARY PROBLEMS  *BOWEL PROBLEMS  UNUSUAL RASH Items with * indicate a potential emergency and should be followed up as soon as possible.  Feel free to call the clinic should you have any questions or concerns. The clinic phone number is [phone].  Please show the [place] at check-in to the Emergency Department and triage nurse.

## 2017-09-26 ENCOUNTER — Other Ambulatory Visit: Payer: Self-pay | Admitting: Oncology

## 2017-09-26 ENCOUNTER — Ambulatory Visit (HOSPITAL_COMMUNITY)
Admission: RE | Admit: 2017-09-26 | Discharge: 2017-09-26 | Disposition: A | Discharge status: Home / Self Care | Payer: Medicare Other | Source: Ambulatory Visit | Attending: Oncology | Admitting: Oncology

## 2017-09-26 ENCOUNTER — Ambulatory Visit (HOSPITAL_COMMUNITY)
Admission: RE | Admit: 2017-09-26 | Discharge: 2017-09-26 | Disposition: A | Discharge status: Home / Self Care | Payer: Medicare Other | Source: Ambulatory Visit | Attending: Physician Assistant | Admitting: Physician Assistant

## 2017-09-26 DIAGNOSIS — C3491 Malignant neoplasm of unspecified part of right bronchus or lung: Secondary | ICD-10-CM | POA: Diagnosis not present

## 2017-09-26 DIAGNOSIS — J90 Pleural effusion, not elsewhere classified: Secondary | ICD-10-CM | POA: Insufficient documentation

## 2017-09-26 MED ORDER — LIDOCAINE HCL 1 % IJ SOLN
INTRAMUSCULAR | Status: AC
  Filled 2017-09-26: qty 20

## 2017-09-26 MED ORDER — LEVOTHYROXINE SODIUM 50 MCG PO TABS: 50.0000 ug | ORAL_TABLET | Freq: Every day | ORAL | 1 refills | Status: DC

## 2017-09-26 NOTE — Procedures (Signed)
PROCEDURE SUMMARY:  Successful US guided right thoracentesis. Yielded 1.5 liters of clear yellow fluid. Patient tolerated procedure well. No immediate complications.  Post procedure chest X-ray reveals no pneumothorax  Chevella Pearce S Tyneisha Hegeman PA-C 09/26/2017 11:33 AM

## 2017-09-27 ENCOUNTER — Telehealth: Payer: Self-pay | Admitting: *Deleted

## 2017-09-27 NOTE — Telephone Encounter (Signed)
Called patient and spoke with daughter.  Advised that TSH levels showed a need to be placed on Synthyroid.  Patients daughter understood.  She also stated that patient was feeling much better since having the thoracentesis and reports going down from 5 liters to 2 liters of oxygen.  She questioned if she should have another pluerex cathether placed.  Encouraged earlier follow up with Pulmonologist since she did not have another appt until September.  Daughter states she understands.

## 2017-10-04 ENCOUNTER — Ambulatory Visit (INDEPENDENT_AMBULATORY_CARE_PROVIDER_SITE_OTHER)
Admission: RE | Admit: 2017-10-04 | Discharge: 2017-10-04 | Disposition: A | Discharge status: Home / Self Care | Payer: Medicare Other | Source: Ambulatory Visit | Attending: Adult Health | Admitting: Adult Health

## 2017-10-04 ENCOUNTER — Telehealth: Payer: Self-pay | Admitting: Pulmonary Disease

## 2017-10-04 ENCOUNTER — Encounter: Payer: Self-pay | Admitting: Adult Health

## 2017-10-04 ENCOUNTER — Ambulatory Visit (INDEPENDENT_AMBULATORY_CARE_PROVIDER_SITE_OTHER): Payer: Medicare Other | Admitting: Adult Health

## 2017-10-04 VITALS — BP 122/78 | HR 90 | Ht 69.0 in | Wt 159.8 lb

## 2017-10-04 DIAGNOSIS — C3491 Malignant neoplasm of unspecified part of right bronchus or lung: Secondary | ICD-10-CM

## 2017-10-04 DIAGNOSIS — J9611 Chronic respiratory failure with hypoxia: Secondary | ICD-10-CM

## 2017-10-04 DIAGNOSIS — J90 Pleural effusion, not elsewhere classified: Secondary | ICD-10-CM

## 2017-10-04 NOTE — Patient Instructions (Signed)
May use Xopenex Neb Twice daily   Continue on Oxygen to keep O2 sats >90%  Follow up with Dr. Lake Bells or Trinette Vera NP in 2 weeks and As needed  With chest xray today .  Please contact office for sooner follow up if symptoms do not improve or worsen or seek emergency care

## 2017-10-04 NOTE — Assessment & Plan Note (Signed)
Cont follow up with Oncology .

## 2017-10-04 NOTE — Progress Notes (Signed)
@Patient  ID: Cassidy Sanders, female    DOB: 1940-07-11, 77 y.o.   MRN: [id number]  Chief Complaint  Patient presents with  . Acute Visit    Referring provider: [name], MD  HPI: 77 yo female former smoker with  Metastatic non-small cell lung cancer diagnosed November 2018. Recurrent pleural effusion -right (previous pleurx  4/-08/2017  ) (s/p chemo with Carboplatin, paciltaxel and Keytruda x 3 cyclies ) On Keytruda every 3 weeks currently  Initial presentation with large right suprahilar and mediastinal mass. She is followed by oncology undergoing palliative radiotherapy.  PMH significant for SVC syndrome dx fall 2018  Retired Marine scientist   TEST  CT chest February 03, 2017 5.8 paratracheal mediastinal mass causing marketed narrowing of the SVC with SVC syndrome resulting in anterior chest wall collaterals and collaterals via the heme azygous and intercostal veins of the mediastinal.  Narrowing of the right main pulmonary artery and branches to the right upper lobe and luminal narrowing of the right mainstem bronchus.  PET scan March 12, 2017 shows a 6.0 cm low right paratracheal mass, encircling the right mainstem bronchus and bronchus intermedius and extending into the subcarinal region No findings specific for metastatic disease..  Small right pleural effusion.  CT chest September 21, 2017 showed improved aeration in the right upper lobe with presumed evolving radiation therapy effect, large right pleural effusion with right middle and right lower lobe collapse small left pleural effusion.   10/04/2017 Acute OV : Dyspnea  Patient presents for an acute office visit.  Patient has known metastatic lung cancer with large right suprahilar and mediastinal mass and malignant right pleural effusion.  Earlier this year patient had a Pleurx catheter for recurrent right effusion.  This was removed in April.  Patient was recently seen with oncology for follow-up CT results.  She is currently on  Keytruda IV every 3 weeks.  CT chest on July 12 that showed improved aeration in the right upper lobe and a large right pleural effusion with right middle and right lower lobe collapse.  Patient was set up for a therapeutic right thoracentesis that was done on July 17, with 1.5 L  Removed.  There was a decreased size of the right effusion with remaining significant effusion remaining on the right.  Patient says she did have improvement in her breathing for couple days after the thoracentesis.  She had less shortness of breath.  However over the last few days of breathing has been becoming more difficult.  She gets short of breath easily.  Worried that the effusion has gotten worse. She denies any fever, hemoptysis, chest pain, orthopnea or increased leg swelling, calf pain .  She remains on oxygen 5l/m .  This has increased over last couple of months since treatments . Chest x-ray today shows decreased right pleural effusion , increased aeration of  Of right lung. Right lung mass appears more prominent .    Allergies  Allergen Reactions  . Latex Dermatitis  . Gabapentin Other (See Comments)    Excessive sleep   . Tape Rash    Immunization History  Administered Date(s) Administered  . Influenza, High Dose Seasonal PF 12/26/2016  . Influenza-Unspecified 12/12/2011, 12/21/2014, 12/13/2015  . Pneumococcal Conjugate-13 12/11/2013  . Pneumococcal Polysaccharide-23 12/11/2012  . Td 03/13/1998    Past Medical History:  Diagnosis Date  . Arrhythmia   . Atrial fibrillation ([place])   . Brain tumor (benign) ([place])   . Depression   . Dyspnea  home oxygen  . Dysrhythmia    a fib  . History of home oxygen therapy 02/2017   2 L/minute  . Hypertension     Tobacco History: Social History   Tobacco Use  Smoking Status Former Smoker  . Packs/day: 1.00  . Years: 59.00  . Pack years: 59.00  . Types: Cigarettes  . Last attempt to quit: 06/11/2012  . Years since quitting: 5.3  Smokeless Tobacco  Never Used   Counseling given: Not Answered   Outpatient Medications Prior to Visit  Medication Sig Dispense Refill  . apixaban (ELIQUIS) 5 MG TABS tablet Take 1 tablet (5 mg total) by mouth 2 (two) times daily. 180 tablet 3  . Biotin 2.5 MG TABS Take 2,500 mcg by mouth daily.    . clobetasol cream (TEMOVATE) 9.81 % Apply 1 application topically 2 (two) times daily. 60 g 0  . diphenhydrAMINE (BENADRYL) 25 mg capsule Take 1 capsule (25 mg total) by mouth at bedtime as needed for sleep. 30 capsule 0  . feeding supplement, ENSURE ENLIVE, (ENSURE ENLIVE) LIQD Take 237 mLs by mouth 2 (two) times daily as needed (If patient or family requests, if pt unable to eat for a meal, or if intakes are poor). 237 mL 12  . hydrOXYzine (ATARAX/VISTARIL) 10 MG tablet Take 1 tablet (10 mg total) by mouth 3 (three) times daily as needed for itching. 30 tablet 0  . levalbuterol (XOPENEX) 0.63 MG/3ML nebulizer solution Take 3 mLs (0.63 mg total) by nebulization every 6 (six) hours as needed for wheezing or shortness of breath. 3 mL 12  . levothyroxine (SYNTHROID, LEVOTHROID) 50 MCG tablet Take 1 tablet (50 mcg total) by mouth daily before breakfast. 30 tablet 1  . loperamide (IMODIUM) 2 MG capsule Take 2 mg by mouth as needed for diarrhea or loose stools.    . metoprolol tartrate (LOPRESSOR) 50 MG tablet Take 1 tablet (50 mg total) by mouth 2 (two) times daily. (Patient taking differently: Take 25 mg by mouth daily. ) 60 tablet 0  . oxyCODONE (OXY IR/ROXICODONE) 5 MG immediate release tablet Take 1 tablet by mouth every 2 (two) hours as needed.   0  . sertraline (ZOLOFT) 50 MG tablet Take 50 mg by mouth daily.     No facility-administered medications prior to visit.      Review of Systems  Constitutional:   No  weight loss, night sweats,  Fevers, chills, + fatigue, or  lassitude.  HEENT:   No headaches,  Difficulty swallowing,  Tooth/dental problems, or  Sore throat,                No sneezing, itching, ear  ache, nasal congestion, post nasal drip,   CV:  No chest pain,  Orthopnea, PND, swelling in lower extremities, anasarca, dizziness, palpitations, syncope.   GI  + loss of appetite .   Resp:    No chest wall deformity  Skin: no rash or lesions.  GU: no dysuria, change in color of urine, no urgency or frequency.  No flank pain, no hematuria   MS:  No joint pain or swelling.  No decreased range of motion.  No back pain.    Physical Exam  BP 122/78 (BP Location: Left Arm, Cuff Size: Normal)   Pulse 90   Ht 5\' 9"  (1.753 m)   Wt 159 lb 12.8 oz (72.5 kg)   SpO2 97%   BMI 23.60 kg/m   GEN: A/Ox3; pleasant , NAD, thin frail , in  wc on O2    HEENT:  [place]/AT,  EACs-clear, TMs-wnl, NOSE-clear, THROAT-clear, no lesions, no postnasal drip or exudate noted.   NECK:  Supple w/ fair ROM; no JVD; normal carotid impulses w/o bruits; no thyromegaly or nodules palpated; no lymphadenopathy.    RESP  Decreased BS on right , . no accessory muscle use, no dullness to percussion  CARD:  RRR, no m/r/g, no peripheral edema, pulses intact, no cyanosis or clubbing.  GI:   Soft & nt; nml bowel sounds; no organomegaly or masses detected.   Musco: Warm bil, no deformities or joint swelling noted.   Neuro: alert, no focal deficits noted.    Skin: Warm, no lesions or rashes    Lab Results:  CBC    Component Value Date/Time   WBC 7.4 09/25/2017 1349   WBC 7.1 08/28/2017 1239   RBC 3.98 09/25/2017 1349   HGB 10.7 (L) 09/25/2017 1349   HGB 11.8 03/15/2017 1436   HCT 33.2 (L) 09/25/2017 1349   HCT 37.8 03/15/2017 1436   PLT 289 09/25/2017 1349   PLT 215 03/15/2017 1436   MCV 83.5 09/25/2017 1349   MCV 92.4 03/15/2017 1436   MCH 27.0 09/25/2017 1349   MCHC 32.3 09/25/2017 1349   RDW 15.9 (H) 09/25/2017 1349   RDW 15.3 (H) 03/15/2017 1436   LYMPHSABS 0.6 (L) 09/25/2017 1349   LYMPHSABS 1.8 03/15/2017 1436   MONOABS 0.6 09/25/2017 1349   MONOABS 0.7 03/15/2017 1436   EOSABS 0.1 09/25/2017  1349   EOSABS 0.1 03/15/2017 1436   BASOSABS 0.0 09/25/2017 1349   BASOSABS 0.0 03/15/2017 1436    BMET    Component Value Date/Time   NA 142 09/25/2017 1349   NA 143 03/15/2017 1436   K 3.5 09/25/2017 1349   K 3.7 03/15/2017 1436   CL 100 09/25/2017 1349   CO2 33 (H) 09/25/2017 1349   CO2 34 (H) 03/15/2017 1436   GLUCOSE 126 (H) 09/25/2017 1349   GLUCOSE 92 03/15/2017 1436   BUN 12 09/25/2017 1349   BUN 17.8 03/15/2017 1436   CREATININE 0.65 09/25/2017 1349   CREATININE 0.8 03/15/2017 1436   CALCIUM 9.7 09/25/2017 1349   CALCIUM 9.6 03/15/2017 1436   GFRNONAA >60 09/25/2017 1349   GFRAA >60 09/25/2017 1349    BNP No results found for: BNP  ProBNP No results found for: PROBNP  Imaging: Dg Chest 1 View  Result Date: 09/26/2017 CLINICAL DATA:  Status post thoracentesis EXAM: CHEST  1 VIEW COMPARISON:  Chest radiograph July 04, 2017 and chest CT September 21, 2017 FINDINGS: No pneumothorax. There remains extensive pleural effusion on the right with consolidation on the right. There is associated volume loss on the right. The left lung is clear. Heart size and pulmonary vascular normal. No adenopathy. There is aortic atherosclerosis. No bone lesions. IMPRESSION: No pneumothorax. Pleural effusions somewhat smaller on the right following thoracentesis. There remains significant pleural effusion on the right with areas of consolidation and volume loss. Left lung clear. Stable cardiac silhouette. There is aortic atherosclerosis. Aortic Atherosclerosis (ICD10-I70.0). Electronically Signed   By: Lowella Grip III M.D.   On: 09/26/2017 11:22   Dg Chest 2 View  Addendum Date: 10/04/2017   ADDENDUM REPORT: 10/04/2017 16:26 ADDENDUM: The patient's previous studies did not appear in the PACS archive when originally interpreted. These are now available. When compared to the chest x-ray of 26 September 2017 the volume of pleural fluid on the right is slightly less. There is  somewhat increased  conspicuity of the right perihilar soft tissue masslike density. The large right pleural effusion visible on the CT images of September 21, 2017 is clearly smaller. Electronically Signed   By: David  Martinique M.D.   On: 10/04/2017 16:26   Result Date: 10/04/2017 CLINICAL DATA:  Follow-up of pleural effusion. Persistent shortness of breath. Former smoker. History of atrial fibrillation, hypertension, malignancy in the posterior mediastinum. EXAM: CHEST - 2 VIEW COMPARISON:  None in PACs FINDINGS: The left lung is well-expanded. The interstitial markings are mildly prominent inferiorly. There is mild pleural thickening in the left pulmonary apex. On the right there is volume loss. There is confluent airspace opacity in the right perihilar region. There is pleural thickening versus pleural fluid surrounding the periphery of the right lung. There is increased density at the right lung base. The left heart border is normal. The right heart border is obscured. There is calcification in the wall of the thoracic aorta. There is multilevel degenerative disc disease of the thoracic spine. IMPRESSION: Abnormal soft tissue masslike density in the right hilar region extending into the posterior inferior aspect of the right upper lobe. Probable pleural fluid surrounding the right lung and obscuring the right hemidiaphragm. Right basilar infiltrate or atelectasis is not excluded. Thoracic aortic atherosclerosis. Electronically Signed: By: David  Martinique M.D. On: 10/04/2017 15:50   Ct Chest W Contrast  Result Date: 09/21/2017 CLINICAL DATA:  Lung cancer on maintenance chemotherapy. Previous radiation therapy. EXAM: CT CHEST, ABDOMEN, AND PELVIS WITH CONTRAST TECHNIQUE: Multidetector CT imaging of the chest, abdomen and pelvis was performed following the standard protocol during bolus administration of intravenous contrast. CONTRAST:  123mL ISOVUE-300 IOPAMIDOL (ISOVUE-300) INJECTION 61% COMPARISON:  CT chest 06/27/2017, PET  03/12/2017 and CT abdomen pelvis 02/01/2017. FINDINGS: CT CHEST FINDINGS Cardiovascular: Atherosclerotic calcification of the arterial vasculature, including three-vessel involvement of coronary arteries and aortic valve. Pulmonary arteries and heart are enlarged. No pericardial effusion. Mediastinum/Nodes: Low-attenuation thyroid nodules measure up to 1.6 cm on the right, similar. No discrete mediastinal adenopathy. There is soft tissue thickening along the lower trachea and right mainstem bronchus. No axillary adenopathy. Esophagus is grossly unremarkable. Lungs/Pleura: Slight improvement in aeration in the right upper lobe with residual nodularity, consolidation, bronchiectasis and ground-glass, presumably related to radiation therapy. Complete collapse of the right middle and right lower lobes. Large right pleural effusion, similar. Probable evolving radiation changes in the medial left upper lobe. Mild scarring in the lingula. Tiny left pleural effusion. Scattered dependent subpleural atelectasis in the left lower lobe. Narrowing of the right upper and right middle lobe bronchi. Airway is otherwise unremarkable. Musculoskeletal: Degenerative changes in the spine. No worrisome lytic or sclerotic lesions. T12 compression fracture is unchanged. CT ABDOMEN PELVIS FINDINGS Hepatobiliary: Liver may be slightly decreased in attenuation diffusely and measures approximately 20 cm. Stones layer in the gallbladder. No biliary ductal dilatation. Pancreas: Negative. Spleen: Negative. Adrenals/Urinary Tract: Right adrenal nodule measures 1.5 cm and left adrenal nodule, 2.0 cm, stable from 03/12/2017, at which time they were shown to be fluid in density. Subcentimeter low-attenuation lesions in the kidneys are too small to characterize. Ureters are decompressed. Bladder is grossly unremarkable. Stomach/Bowel: Stomach, small bowel and colon are unremarkable. Appendix not readily visualized. Vascular/Lymphatic: Atherosclerotic  calcification of the arterial vasculature. Infrarenal aorta measures up to 2.7 cm. No pathologically enlarged lymph nodes. Reproductive: Hysterectomy.  No adnexal mass. Other: No free fluid.  Mesenteries and peritoneum are unremarkable. Musculoskeletal: Degenerative changes in the spine. No worrisome lytic or  sclerotic lesions. IMPRESSION: 1. Interval improvement in aeration in the right upper lobe, with presumed evolving radiation therapy effect. 2. Large right pleural effusion with right middle and right lower lobe collapse, similar. 3. Small left pleural effusion. 4. Enlarged pulmonary arteries, indicative of pulmonary arterial hypertension. 5. Atherosclerotic calcification of the arterial vasculature, including coronary arteries. 6. Ectatic abdominal aorta at risk for aneurysm development. Recommend followup by ultrasound in 5 years. This recommendation follows ACR consensus guidelines: White Paper of the ACR Incidental Findings Committee II on Vascular Findings. J Am Coll Radiol 2013; 10:789-794. 7. Hepatomegaly.  Liver may be steatotic. 8. Cholelithiasis. 9. Bilateral adrenal adenomas. Electronically Signed   By: Lorin Picket M.D.   On: 09/21/2017 12:44   Ct Abdomen Pelvis W Contrast  Result Date: 09/21/2017 CLINICAL DATA:  Lung cancer on maintenance chemotherapy. Previous radiation therapy. EXAM: CT CHEST, ABDOMEN, AND PELVIS WITH CONTRAST TECHNIQUE: Multidetector CT imaging of the chest, abdomen and pelvis was performed following the standard protocol during bolus administration of intravenous contrast. CONTRAST:  19mL ISOVUE-300 IOPAMIDOL (ISOVUE-300) INJECTION 61% COMPARISON:  CT chest 06/27/2017, PET 03/12/2017 and CT abdomen pelvis 02/01/2017. FINDINGS: CT CHEST FINDINGS Cardiovascular: Atherosclerotic calcification of the arterial vasculature, including three-vessel involvement of coronary arteries and aortic valve. Pulmonary arteries and heart are enlarged. No pericardial effusion.  Mediastinum/Nodes: Low-attenuation thyroid nodules measure up to 1.6 cm on the right, similar. No discrete mediastinal adenopathy. There is soft tissue thickening along the lower trachea and right mainstem bronchus. No axillary adenopathy. Esophagus is grossly unremarkable. Lungs/Pleura: Slight improvement in aeration in the right upper lobe with residual nodularity, consolidation, bronchiectasis and ground-glass, presumably related to radiation therapy. Complete collapse of the right middle and right lower lobes. Large right pleural effusion, similar. Probable evolving radiation changes in the medial left upper lobe. Mild scarring in the lingula. Tiny left pleural effusion. Scattered dependent subpleural atelectasis in the left lower lobe. Narrowing of the right upper and right middle lobe bronchi. Airway is otherwise unremarkable. Musculoskeletal: Degenerative changes in the spine. No worrisome lytic or sclerotic lesions. T12 compression fracture is unchanged. CT ABDOMEN PELVIS FINDINGS Hepatobiliary: Liver may be slightly decreased in attenuation diffusely and measures approximately 20 cm. Stones layer in the gallbladder. No biliary ductal dilatation. Pancreas: Negative. Spleen: Negative. Adrenals/Urinary Tract: Right adrenal nodule measures 1.5 cm and left adrenal nodule, 2.0 cm, stable from 03/12/2017, at which time they were shown to be fluid in density. Subcentimeter low-attenuation lesions in the kidneys are too small to characterize. Ureters are decompressed. Bladder is grossly unremarkable. Stomach/Bowel: Stomach, small bowel and colon are unremarkable. Appendix not readily visualized. Vascular/Lymphatic: Atherosclerotic calcification of the arterial vasculature. Infrarenal aorta measures up to 2.7 cm. No pathologically enlarged lymph nodes. Reproductive: Hysterectomy.  No adnexal mass. Other: No free fluid.  Mesenteries and peritoneum are unremarkable. Musculoskeletal: Degenerative changes in the spine.  No worrisome lytic or sclerotic lesions. IMPRESSION: 1. Interval improvement in aeration in the right upper lobe, with presumed evolving radiation therapy effect. 2. Large right pleural effusion with right middle and right lower lobe collapse, similar. 3. Small left pleural effusion. 4. Enlarged pulmonary arteries, indicative of pulmonary arterial hypertension. 5. Atherosclerotic calcification of the arterial vasculature, including coronary arteries. 6. Ectatic abdominal aorta at risk for aneurysm development. Recommend followup by ultrasound in 5 years. This recommendation follows ACR consensus guidelines: White Paper of the ACR Incidental Findings Committee II on Vascular Findings. J Am Coll Radiol 2013; 10:789-794. 7. Hepatomegaly.  Liver may be steatotic. 8. Cholelithiasis.  9. Bilateral adrenal adenomas. Electronically Signed   By: Lorin Picket M.D.   On: 09/21/2017 12:44   US Thoracentesis Asp Pleural Space W/img Guide  Result Date: 09/26/2017 INDICATION: Non-small cell lung cancer. Right pleural effusion. Request for therapeutic thoracentesis. EXAM: ULTRASOUND GUIDED RIGHT THORACENTESIS MEDICATIONS: 1% Lidocaine = 10 mL COMPLICATIONS: None immediate. PROCEDURE: An ultrasound guided thoracentesis was thoroughly discussed with the patient and questions answered. The benefits, risks, alternatives and complications were also discussed. The patient understands and wishes to proceed with the procedure. Written consent was obtained. Ultrasound was performed to localize and mark an adequate pocket of fluid in the right chest. The area was then prepped and draped in the normal sterile fashion. 1% Lidocaine was used for local anesthesia. Under ultrasound guidance a 6 Fr Safe-T-Centesis catheter was introduced. Thoracentesis was performed. The catheter was removed and a dressing applied. FINDINGS: A total of approximately 1.5 liters of clear yellow fluid was removed. IMPRESSION: Successful ultrasound guided right  thoracentesis yielding 1.5 liters of pleural fluid. No pneumothorax on post procedure chest X-ray. Read by: Gareth Eagle, PA-C Electronically Signed   By: Markus Daft M.D.   On: 09/26/2017 11:32     Assessment & Plan:   Pleural effusion on right Recurrent Right Pleural Effusion with underlying metastatic lung cancer - underwent thoracentesis on 7/17 with 1.5L removed. Clinically improved after w/d . CXR today surprisingly does not show increased effusion , actually has decreased size and increased aeration of right lung . Large right lung mass more prominently noted on cxr now that effusion is smaller.  Clinically she is stable . Hold on pleurx for now . Monitor clinically cont w/ follow up with Oncology  Will see back in 2 weeks with cxr .   Plan  Patient Instructions  May use Xopenex Neb Twice daily   Continue on Oxygen to keep O2 sats >90%  Follow up with Dr. Lake Bells or Audryana Hockenberry NP in 2 weeks and As needed  With chest xray today .  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   Chronic respiratory failure with hypoxia ([facility]) Increased O2 demands over last couple of months with lung cancer , s/p chemo and radiation . Recurrent pleural effusion  Does not appear to have radiation pneumonitis . Suspect is secondary to all the above.  Would use continuous flow oxygen only , avoid demand /;pulse setting if possible  follow up 2 weeks and As needed   Please contact office for sooner follow up if symptoms do not improve or worsen or seek emergency care       Rexene Edison, NP 10/04/2017

## 2017-10-04 NOTE — Telephone Encounter (Signed)
Spoke with Almyra Free and gave dx code R06.02 (SOB or wheezing) Nothing further needed

## 2017-10-04 NOTE — Assessment & Plan Note (Signed)
Recurrent Right Pleural Effusion with underlying metastatic lung cancer - underwent thoracentesis on 7/17 with 1.5L removed. Clinically improved after w/d . CXR today surprisingly does not show increased effusion , actually has decreased size and increased aeration of right lung . Large right lung mass more prominently noted on cxr now that effusion is smaller.  Clinically she is stable . Hold on pleurx for now . Monitor clinically cont w/ follow up with Oncology  Will see back in 2 weeks with cxr .   Plan  Patient Instructions  May use Xopenex Neb Twice daily   Continue on Oxygen to keep O2 sats >90%  Follow up with Dr. Lake Bells or Ronnita Paz NP in 2 weeks and As needed  With chest xray today .  Please contact office for sooner follow up if symptoms do not improve or worsen or seek emergency care

## 2017-10-04 NOTE — Assessment & Plan Note (Signed)
Increased O2 demands over last couple of months with lung cancer , s/p chemo and radiation . Recurrent pleural effusion  Does not appear to have radiation pneumonitis . Suspect is secondary to all the above.  Would use continuous flow oxygen only , avoid demand /;pulse setting if possible  follow up 2 weeks and As needed   Please contact office for sooner follow up if symptoms do not improve or worsen or seek emergency care

## 2017-10-04 NOTE — Telephone Encounter (Signed)
Wheezing or shortness of breath

## 2017-10-04 NOTE — Progress Notes (Signed)
Reviwed, agree

## 2017-10-04 NOTE — Telephone Encounter (Signed)
Called and spoke with Almyra Free who stated pt had pleural cath removed 1 month ago.  Per Almyra Free pt has had c/o increased SOB and is unable to be seen again until August to have the pleural cath put back in.  Pt is currently having to be on 5L O2 to feel better, has decreased appetite, and is unable to walk far distances before she is having to stop due to her SOB.  Per Almyra Free, they were wanting to know if there is any way pt's pleural cath could be placed sooner than August due to pt's condition worsening.  Stated to Almyra Free that we should get pt to come in for an appt due to pt's worsening breathing. Almyra Free expressed understanding and agreed.  Called pt to see if she could come in for an appt today and pt stated she could. Scheduled an acute visit with TP today at [place] back and stated to her pt's appt time. Almyra Free expressed understanding. Nothing further needed.

## 2017-10-04 NOTE — Telephone Encounter (Signed)
Dr. Lake Bells, please advise if there is another Dx code that can be used other than J96.11 (chronic respiratory failure with hypoxia) as a diagnosis for pt's nebulizer solution so that it could be covered by pt's insurance.

## 2017-10-12 ENCOUNTER — Telehealth: Payer: Self-pay | Admitting: Pulmonary Disease

## 2017-10-12 NOTE — Telephone Encounter (Signed)
Cassidy Sanders, [facility], Cb is [phone].

## 2017-10-12 NOTE — Telephone Encounter (Signed)
Called and spoke with Almyra Free, advised that patient has refills at pharmacy. Nothing further needed.

## 2017-10-12 NOTE — Telephone Encounter (Signed)
Spoke with Cassidy Sanders with [facility]  I advised that we do not have albuterol on her med list  She should be using xopenex nebs She will call the pharm to see if there is a code needed for this  Nothing further needed at this time

## 2017-10-15 ENCOUNTER — Encounter: Payer: Self-pay | Admitting: Internal Medicine

## 2017-10-15 ENCOUNTER — Inpatient Hospital Stay: Payer: Medicare Other | Attending: Internal Medicine | Admitting: Internal Medicine

## 2017-10-15 ENCOUNTER — Inpatient Hospital Stay: Payer: Medicare Other

## 2017-10-15 ENCOUNTER — Telehealth: Payer: Self-pay | Admitting: Internal Medicine

## 2017-10-15 ENCOUNTER — Other Ambulatory Visit: Payer: Self-pay | Admitting: Internal Medicine

## 2017-10-15 VITALS — BP 119/64 | HR 101 | Temp 97.7°F | Resp 20 | Ht 69.0 in | Wt 158.6 lb

## 2017-10-15 DIAGNOSIS — C3491 Malignant neoplasm of unspecified part of right bronchus or lung: Secondary | ICD-10-CM | POA: Diagnosis not present

## 2017-10-15 DIAGNOSIS — Z79899 Other long term (current) drug therapy: Secondary | ICD-10-CM | POA: Insufficient documentation

## 2017-10-15 DIAGNOSIS — Z87891 Personal history of nicotine dependence: Secondary | ICD-10-CM | POA: Insufficient documentation

## 2017-10-15 DIAGNOSIS — Z5112 Encounter for antineoplastic immunotherapy: Secondary | ICD-10-CM | POA: Insufficient documentation

## 2017-10-15 DIAGNOSIS — C382 Malignant neoplasm of posterior mediastinum: Secondary | ICD-10-CM

## 2017-10-15 DIAGNOSIS — I4891 Unspecified atrial fibrillation: Secondary | ICD-10-CM

## 2017-10-15 DIAGNOSIS — R5382 Chronic fatigue, unspecified: Secondary | ICD-10-CM

## 2017-10-15 LAB — CBC WITH DIFFERENTIAL (CANCER CENTER ONLY)
Basophils Absolute: 0 10*3/uL (ref 0.0–0.1)
Basophils Relative: 1 %
EOS ABS: 0.1 10*3/uL (ref 0.0–0.5)
EOS PCT: 2 %
HEMATOCRIT: 33.9 % — AB (ref 34.8–46.6)
HEMOGLOBIN: 11 g/dL — AB (ref 11.6–15.9)
LYMPHS ABS: 0.8 10*3/uL — AB (ref 0.9–3.3)
LYMPHS PCT: 14 %
MCH: 26.6 pg (ref 25.1–34.0)
MCHC: 32.3 g/dL (ref 31.5–36.0)
MCV: 82.2 fL (ref 79.5–101.0)
MONO ABS: 0.5 10*3/uL (ref 0.1–0.9)
Monocytes Relative: 9 %
Neutro Abs: 4.1 10*3/uL (ref 1.5–6.5)
Neutrophils Relative %: 74 %
Platelet Count: 312 10*3/uL (ref 145–400)
RBC: 4.12 MIL/uL (ref 3.70–5.45)
RDW: 16 % — AB (ref 11.2–14.5)
WBC Count: 5.6 10*3/uL (ref 3.9–10.3)

## 2017-10-15 LAB — CMP (CANCER CENTER ONLY)
ALBUMIN: 2.7 g/dL — AB (ref 3.5–5.0)
ALT: 11 U/L (ref 0–44)
AST: 17 U/L (ref 15–41)
Alkaline Phosphatase: 112 U/L (ref 38–126)
Anion gap: 11 (ref 5–15)
BILIRUBIN TOTAL: 0.5 mg/dL (ref 0.3–1.2)
BUN: 9 mg/dL (ref 8–23)
CHLORIDE: 102 mmol/L (ref 98–111)
CO2: 29 mmol/L (ref 22–32)
CREATININE: 0.72 mg/dL (ref 0.44–1.00)
Calcium: 9.2 mg/dL (ref 8.9–10.3)
GFR, Est AFR Am: >60 mL/min (ref >60)
GLUCOSE: 114 mg/dL — AB (ref 70–99)
Potassium: 3.8 mmol/L (ref 3.5–5.1)
Sodium: 142 mmol/L (ref 135–145)
TOTAL PROTEIN: 7.3 g/dL (ref 6.5–8.1)

## 2017-10-15 LAB — TSH: TSH: 21.177 u[IU]/mL — ABNORMAL HIGH (ref 0.308–3.960)

## 2017-10-15 MED ORDER — LEVOTHYROXINE SODIUM 75 MCG PO TABS: 75.0000 ug | ORAL_TABLET | Freq: Every day | ORAL | 0 refills | Status: DC

## 2017-10-15 MED ORDER — PEMBROLIZUMAB CHEMO INJECTION 100 MG/4ML
200.0000 mg | Freq: Once | INTRAVENOUS | Status: AC
  Administered 2017-10-15: 200 mg via INTRAVENOUS
  Filled 2017-10-15: qty 8

## 2017-10-15 MED ORDER — SODIUM CHLORIDE 0.9 % IV SOLN
Freq: Once | INTRAVENOUS | Status: AC
  Administered 2017-10-15: 14:00:00 via INTRAVENOUS
  Filled 2017-10-15: qty 250

## 2017-10-15 NOTE — Progress Notes (Signed)
TSH = 21.177 MD will adjust Synthroid dose. Kennith Center, Pharm.D., CPP 10/15/2017@2 :28 PM

## 2017-10-15 NOTE — Progress Notes (Signed)
[facility] [phone]   [phone]) [phone]  OFFICE PROGRESS NOTE  [name], MD [address] [place] [address]  DIAGNOSIS: Metastatic non-small cell carcinoma (T3, N2, M1a) non-small cell lung cancer diagnosed in November 2018 and presented with large right suprahilar and mediastinal mass as well as recent right pleural effusion.  Guardant 360: No actionable mutations.  PRIOR THERAPY:  1) Palliative radiotherapy to the large mediastinal mass under the care of Dr. Lisbeth Renshaw. 2) Systemic chemotherapy with carboplatin for AUC of 5, paclitaxel 175 mg/M2 and Keytruda 200 mg IV every 3 weeks.  First dose expected May 01, 2017.  Status post 3 cycles.  CURRENT THERAPY: Maintenance treatment with immunotherapy with single agent Keytruda 200 mg IV every 3 weeks.  First cycle 07/24/2017.  Status post 4 cycles.  INTERVAL HISTORY: Cassidy Sanders 77 y.o. female returns to the clinic today for follow-up visit accompanied by her daughter.  The patient is feeling fine today with no specific complaints.  She underwent ultrasound-guided thoracentesis few weeks ago and she felt much better.  She denied having any significant chest pain, shortness breath except with exertion with no cough or hemoptysis.  She denied having any recent weight loss or night sweats.  She has no nausea, vomiting, diarrhea or constipation.  She continues to tolerate her treatment with Keytruda fairly well.  She decided on her own to stop her treatment with Eliquis because of her bleeding issues and she understands the risk of stroke.  She is here today for evaluation before starting cycle #5 of her treatment.  MEDICAL HISTORY: Past Medical History:  Diagnosis Date  . Arrhythmia   . Atrial fibrillation ([place])   . Brain tumor (benign) ([place])   . Depression   . Dyspnea    home oxygen  . Dysrhythmia    a fib  . History of home oxygen therapy 02/2017   2 L/minute  . Hypertension      ALLERGIES:  is allergic to latex; gabapentin; and tape.  MEDICATIONS:  Current Outpatient Medications  Medication Sig Dispense Refill  . Biotin 2.5 MG TABS Take 2,500 mcg by mouth daily.    . clobetasol cream (TEMOVATE) 8.93 % Apply 1 application topically 2 (two) times daily. 60 g 0  . diphenhydrAMINE (BENADRYL) 25 mg capsule Take 1 capsule (25 mg total) by mouth at bedtime as needed for sleep. 30 capsule 0  . feeding supplement, ENSURE ENLIVE, (ENSURE ENLIVE) LIQD Take 237 mLs by mouth 2 (two) times daily as needed (If patient or family requests, if pt unable to eat for a meal, or if intakes are poor). 237 mL 12  . hydrOXYzine (ATARAX/VISTARIL) 10 MG tablet Take 1 tablet (10 mg total) by mouth 3 (three) times daily as needed for itching. 30 tablet 0  . levalbuterol (XOPENEX) 0.63 MG/3ML nebulizer solution Take 3 mLs (0.63 mg total) by nebulization every 6 (six) hours as needed for wheezing or shortness of breath. 3 mL 12  . levothyroxine (SYNTHROID, LEVOTHROID) 50 MCG tablet Take 1 tablet (50 mcg total) by mouth daily before breakfast. 30 tablet 1  . metoprolol tartrate (LOPRESSOR) 50 MG tablet Take 1 tablet (50 mg total) by mouth 2 (two) times daily. (Patient taking differently: Take 25 mg by mouth daily. ) 60 tablet 0  . oxyCODONE (OXY IR/ROXICODONE) 5 MG immediate release tablet Take 1 tablet by mouth every 2 (two) hours as needed.   0  . sertraline (ZOLOFT) 50 MG tablet  Take 50 mg by mouth daily.    Marland Kitchen albuterol (PROVENTIL) (2.5 MG/3ML) 0.083% nebulizer solution VVN Q 4 H PRF WHEEZING OR SHORTNESS OF BREATH  2  . apixaban (ELIQUIS) 5 MG TABS tablet Take 1 tablet (5 mg total) by mouth 2 (two) times daily. (Patient not taking: Reported on 10/15/2017) 180 tablet 3  . loperamide (IMODIUM) 2 MG capsule Take 2 mg by mouth as needed for diarrhea or loose stools.     No current facility-administered medications for this visit.     SURGICAL HISTORY:  Past Surgical History:  Procedure  Laterality Date  . ABDOMINAL HYSTERECTOMY    . BLADDER SURGERY    . BRAIN SURGERY    . ENDOBRONCHIAL ULTRASOUND Bilateral 02/06/2017   Procedure: ENDOBRONCHIAL ULTRASOUND;  Surgeon: Juanito Doom, MD;  Location: [facility];  Service: Cardiopulmonary;  Laterality: Bilateral;  . IR GUIDED DRAIN W CATHETER PLACEMENT  06/29/2017  . IR REMOVAL OF PLURAL CATH W/CUFF  08/28/2017  . IR US GUIDE BX ASP/DRAIN  06/29/2017  . VIDEO BRONCHOSCOPY WITH ENDOBRONCHIAL ULTRASOUND N/A 03/23/2017   Procedure: VIDEO BRONCHOSCOPY WITH ENDOBRONCHIAL ULTRASOUND;  Surgeon: Juanito Doom, MD;  Location: [facility] OR;  Service: Thoracic;  Laterality: N/A;    REVIEW OF SYSTEMS:  A comprehensive review of systems was negative except for: Constitutional: positive for fatigue Respiratory: positive for dyspnea on exertion   PHYSICAL EXAMINATION: General appearance: alert, cooperative, fatigued and no distress Head: Normocephalic, without obvious abnormality, atraumatic Neck: no adenopathy, no JVD, supple, symmetrical, trachea midline and thyroid not enlarged, symmetric, no tenderness/mass/nodules Lymph nodes: Cervical, supraclavicular, and axillary nodes normal. Resp: clear to auscultation bilaterally Back: symmetric, no curvature. ROM normal. No CVA tenderness. Cardio: regular rate and rhythm, S1, S2 normal, no murmur, click, rub or gallop GI: soft, non-tender; bowel sounds normal; no masses,  no organomegaly Extremities: extremities normal, atraumatic, no cyanosis or edema  ECOG PERFORMANCE STATUS: 1 - Symptomatic but completely ambulatory  Blood pressure 119/64, pulse (!) 101, temperature 97.7 F (36.5 C), temperature source Oral, resp. rate 20, height 5\' 9"  (1.753 m), weight 158 lb 9.6 oz (71.9 kg), SpO2 91 %.  LABORATORY DATA: Lab Results  Component Value Date   WBC 5.6 10/15/2017   HGB 11.0 (L) 10/15/2017   HCT 33.9 (L) 10/15/2017   MCV 82.2 10/15/2017   PLT 312 10/15/2017      Chemistry       Component Value Date/Time   NA 142 10/15/2017 1247   NA 143 03/15/2017 1436   K 3.8 10/15/2017 1247   K 3.7 03/15/2017 1436   CL 102 10/15/2017 1247   CO2 29 10/15/2017 1247   CO2 34 (H) 03/15/2017 1436   BUN 9 10/15/2017 1247   BUN 17.8 03/15/2017 1436   CREATININE 0.72 10/15/2017 1247   CREATININE 0.8 03/15/2017 1436      Component Value Date/Time   CALCIUM 9.2 10/15/2017 1247   CALCIUM 9.6 03/15/2017 1436   ALKPHOS 112 10/15/2017 1247   ALKPHOS 88 03/15/2017 1436   AST 17 10/15/2017 1247   AST 16 03/15/2017 1436   ALT 11 10/15/2017 1247   ALT 15 03/15/2017 1436   BILITOT 0.5 10/15/2017 1247   BILITOT 0.52 03/15/2017 1436       RADIOGRAPHIC STUDIES: Dg Chest 1 View  Result Date: 09/26/2017 CLINICAL DATA:  Status post thoracentesis EXAM: CHEST  1 VIEW COMPARISON:  Chest radiograph July 04, 2017 and chest CT September 21, 2017 FINDINGS: No pneumothorax. There remains extensive  pleural effusion on the right with consolidation on the right. There is associated volume loss on the right. The left lung is clear. Heart size and pulmonary vascular normal. No adenopathy. There is aortic atherosclerosis. No bone lesions. IMPRESSION: No pneumothorax. Pleural effusions somewhat smaller on the right following thoracentesis. There remains significant pleural effusion on the right with areas of consolidation and volume loss. Left lung clear. Stable cardiac silhouette. There is aortic atherosclerosis. Aortic Atherosclerosis (ICD10-I70.0). Electronically Signed   By: Lowella Grip III M.D.   On: 09/26/2017 11:22   Dg Chest 2 View  Addendum Date: 10/04/2017   ADDENDUM REPORT: 10/04/2017 16:26 ADDENDUM: The patient's previous studies did not appear in the PACS archive when originally interpreted. These are now available. When compared to the chest x-ray of 26 September 2017 the volume of pleural fluid on the right is slightly less. There is somewhat increased conspicuity of the right perihilar soft  tissue masslike density. The large right pleural effusion visible on the CT images of September 21, 2017 is clearly smaller. Electronically Signed   By: David  Martinique M.D.   On: 10/04/2017 16:26   Result Date: 10/04/2017 CLINICAL DATA:  Follow-up of pleural effusion. Persistent shortness of breath. Former smoker. History of atrial fibrillation, hypertension, malignancy in the posterior mediastinum. EXAM: CHEST - 2 VIEW COMPARISON:  None in PACs FINDINGS: The left lung is well-expanded. The interstitial markings are mildly prominent inferiorly. There is mild pleural thickening in the left pulmonary apex. On the right there is volume loss. There is confluent airspace opacity in the right perihilar region. There is pleural thickening versus pleural fluid surrounding the periphery of the right lung. There is increased density at the right lung base. The left heart border is normal. The right heart border is obscured. There is calcification in the wall of the thoracic aorta. There is multilevel degenerative disc disease of the thoracic spine. IMPRESSION: Abnormal soft tissue masslike density in the right hilar region extending into the posterior inferior aspect of the right upper lobe. Probable pleural fluid surrounding the right lung and obscuring the right hemidiaphragm. Right basilar infiltrate or atelectasis is not excluded. Thoracic aortic atherosclerosis. Electronically Signed: By: David  Martinique M.D. On: 10/04/2017 15:50   Ct Chest W Contrast  Result Date: 09/21/2017 CLINICAL DATA:  Lung cancer on maintenance chemotherapy. Previous radiation therapy. EXAM: CT CHEST, ABDOMEN, AND PELVIS WITH CONTRAST TECHNIQUE: Multidetector CT imaging of the chest, abdomen and pelvis was performed following the standard protocol during bolus administration of intravenous contrast. CONTRAST:  191mL ISOVUE-300 IOPAMIDOL (ISOVUE-300) INJECTION 61% COMPARISON:  CT chest 06/27/2017, PET 03/12/2017 and CT abdomen pelvis 02/01/2017.  FINDINGS: CT CHEST FINDINGS Cardiovascular: Atherosclerotic calcification of the arterial vasculature, including three-vessel involvement of coronary arteries and aortic valve. Pulmonary arteries and heart are enlarged. No pericardial effusion. Mediastinum/Nodes: Low-attenuation thyroid nodules measure up to 1.6 cm on the right, similar. No discrete mediastinal adenopathy. There is soft tissue thickening along the lower trachea and right mainstem bronchus. No axillary adenopathy. Esophagus is grossly unremarkable. Lungs/Pleura: Slight improvement in aeration in the right upper lobe with residual nodularity, consolidation, bronchiectasis and ground-glass, presumably related to radiation therapy. Complete collapse of the right middle and right lower lobes. Large right pleural effusion, similar. Probable evolving radiation changes in the medial left upper lobe. Mild scarring in the lingula. Tiny left pleural effusion. Scattered dependent subpleural atelectasis in the left lower lobe. Narrowing of the right upper and right middle lobe bronchi. Airway is otherwise unremarkable. Musculoskeletal:  Degenerative changes in the spine. No worrisome lytic or sclerotic lesions. T12 compression fracture is unchanged. CT ABDOMEN PELVIS FINDINGS Hepatobiliary: Liver may be slightly decreased in attenuation diffusely and measures approximately 20 cm. Stones layer in the gallbladder. No biliary ductal dilatation. Pancreas: Negative. Spleen: Negative. Adrenals/Urinary Tract: Right adrenal nodule measures 1.5 cm and left adrenal nodule, 2.0 cm, stable from 03/12/2017, at which time they were shown to be fluid in density. Subcentimeter low-attenuation lesions in the kidneys are too small to characterize. Ureters are decompressed. Bladder is grossly unremarkable. Stomach/Bowel: Stomach, small bowel and colon are unremarkable. Appendix not readily visualized. Vascular/Lymphatic: Atherosclerotic calcification of the arterial vasculature.  Infrarenal aorta measures up to 2.7 cm. No pathologically enlarged lymph nodes. Reproductive: Hysterectomy.  No adnexal mass. Other: No free fluid.  Mesenteries and peritoneum are unremarkable. Musculoskeletal: Degenerative changes in the spine. No worrisome lytic or sclerotic lesions. IMPRESSION: 1. Interval improvement in aeration in the right upper lobe, with presumed evolving radiation therapy effect. 2. Large right pleural effusion with right middle and right lower lobe collapse, similar. 3. Small left pleural effusion. 4. Enlarged pulmonary arteries, indicative of pulmonary arterial hypertension. 5. Atherosclerotic calcification of the arterial vasculature, including coronary arteries. 6. Ectatic abdominal aorta at risk for aneurysm development. Recommend followup by ultrasound in 5 years. This recommendation follows ACR consensus guidelines: White Paper of the ACR Incidental Findings Committee II on Vascular Findings. J Am Coll Radiol 2013; 10:789-794. 7. Hepatomegaly.  Liver may be steatotic. 8. Cholelithiasis. 9. Bilateral adrenal adenomas. Electronically Signed   By: Lorin Picket M.D.   On: 09/21/2017 12:44   Ct Abdomen Pelvis W Contrast  Result Date: 09/21/2017 CLINICAL DATA:  Lung cancer on maintenance chemotherapy. Previous radiation therapy. EXAM: CT CHEST, ABDOMEN, AND PELVIS WITH CONTRAST TECHNIQUE: Multidetector CT imaging of the chest, abdomen and pelvis was performed following the standard protocol during bolus administration of intravenous contrast. CONTRAST:  178mL ISOVUE-300 IOPAMIDOL (ISOVUE-300) INJECTION 61% COMPARISON:  CT chest 06/27/2017, PET 03/12/2017 and CT abdomen pelvis 02/01/2017. FINDINGS: CT CHEST FINDINGS Cardiovascular: Atherosclerotic calcification of the arterial vasculature, including three-vessel involvement of coronary arteries and aortic valve. Pulmonary arteries and heart are enlarged. No pericardial effusion. Mediastinum/Nodes: Low-attenuation thyroid nodules  measure up to 1.6 cm on the right, similar. No discrete mediastinal adenopathy. There is soft tissue thickening along the lower trachea and right mainstem bronchus. No axillary adenopathy. Esophagus is grossly unremarkable. Lungs/Pleura: Slight improvement in aeration in the right upper lobe with residual nodularity, consolidation, bronchiectasis and ground-glass, presumably related to radiation therapy. Complete collapse of the right middle and right lower lobes. Large right pleural effusion, similar. Probable evolving radiation changes in the medial left upper lobe. Mild scarring in the lingula. Tiny left pleural effusion. Scattered dependent subpleural atelectasis in the left lower lobe. Narrowing of the right upper and right middle lobe bronchi. Airway is otherwise unremarkable. Musculoskeletal: Degenerative changes in the spine. No worrisome lytic or sclerotic lesions. T12 compression fracture is unchanged. CT ABDOMEN PELVIS FINDINGS Hepatobiliary: Liver may be slightly decreased in attenuation diffusely and measures approximately 20 cm. Stones layer in the gallbladder. No biliary ductal dilatation. Pancreas: Negative. Spleen: Negative. Adrenals/Urinary Tract: Right adrenal nodule measures 1.5 cm and left adrenal nodule, 2.0 cm, stable from 03/12/2017, at which time they were shown to be fluid in density. Subcentimeter low-attenuation lesions in the kidneys are too small to characterize. Ureters are decompressed. Bladder is grossly unremarkable. Stomach/Bowel: Stomach, small bowel and colon are unremarkable. Appendix not readily visualized. Vascular/Lymphatic:  Atherosclerotic calcification of the arterial vasculature. Infrarenal aorta measures up to 2.7 cm. No pathologically enlarged lymph nodes. Reproductive: Hysterectomy.  No adnexal mass. Other: No free fluid.  Mesenteries and peritoneum are unremarkable. Musculoskeletal: Degenerative changes in the spine. No worrisome lytic or sclerotic lesions. IMPRESSION:  1. Interval improvement in aeration in the right upper lobe, with presumed evolving radiation therapy effect. 2. Large right pleural effusion with right middle and right lower lobe collapse, similar. 3. Small left pleural effusion. 4. Enlarged pulmonary arteries, indicative of pulmonary arterial hypertension. 5. Atherosclerotic calcification of the arterial vasculature, including coronary arteries. 6. Ectatic abdominal aorta at risk for aneurysm development. Recommend followup by ultrasound in 5 years. This recommendation follows ACR consensus guidelines: White Paper of the ACR Incidental Findings Committee II on Vascular Findings. J Am Coll Radiol 2013; 10:789-794. 7. Hepatomegaly.  Liver may be steatotic. 8. Cholelithiasis. 9. Bilateral adrenal adenomas. Electronically Signed   By: Lorin Picket M.D.   On: 09/21/2017 12:44   US Thoracentesis Asp Pleural Space W/img Guide  Result Date: 09/26/2017 INDICATION: Non-small cell lung cancer. Right pleural effusion. Request for therapeutic thoracentesis. EXAM: ULTRASOUND GUIDED RIGHT THORACENTESIS MEDICATIONS: 1% Lidocaine = 10 mL COMPLICATIONS: None immediate. PROCEDURE: An ultrasound guided thoracentesis was thoroughly discussed with the patient and questions answered. The benefits, risks, alternatives and complications were also discussed. The patient understands and wishes to proceed with the procedure. Written consent was obtained. Ultrasound was performed to localize and mark an adequate pocket of fluid in the right chest. The area was then prepped and draped in the normal sterile fashion. 1% Lidocaine was used for local anesthesia. Under ultrasound guidance a 6 Fr Safe-T-Centesis catheter was introduced. Thoracentesis was performed. The catheter was removed and a dressing applied. FINDINGS: A total of approximately 1.5 liters of clear yellow fluid was removed. IMPRESSION: Successful ultrasound guided right thoracentesis yielding 1.5 liters of pleural fluid.  No pneumothorax on post procedure chest X-ray. Read by: Gareth Eagle, PA-C Electronically Signed   By: Markus Daft M.D.   On: 09/26/2017 11:32    ASSESSMENT AND PLAN: This is a very pleasant 77 years old white female recently diagnosed with probably stage IV non-small cell lung cancer presented with large right suprahilar lung mass in addition to mediastinal lymphadenopathy and suspicious malignant right pleural effusion.  The patient underwent systemic chemotherapy with carboplatin, paclitaxel and Keytruda status post 3 cycles.  She was tolerating this treatment well with no concerning complaints except for the last cycle when she was admitted to the hospital with C. difficile as well as dehydration and renal insufficiency.   She is currently on treatment with single agent Ketruda (pembrolizumab) status post 4 cycles.  She has been tolerating this treatment fairly well.  Her last CT scan of the chest showed no concerning findings for disease progression. I recommended for the patient to proceed with her treatment today as a schedule.  She will receive cycle #5 today. She will come back for follow-up visit in 3 weeks for evaluation before the next cycle of her treatment. For the history of atrial fibrillation, the patient decided to discontinue her treatment with Eliquis.  I strongly recommended for her to discuss this decision with her cardiologist. She was advised to call immediately if she has any concerning symptoms in the interval. The patient voices understanding of current disease status and treatment options and is in agreement with the current care plan. All questions were answered. The patient knows to call the clinic with  any problems, questions or concerns. We can certainly see the patient much sooner if necessary. I spent 10 minutes counseling the patient face to face. The total time spent in the appointment was 15 minutes.   Disclaimer: This note was dictated with voice recognition  software. Similar sounding words can inadvertently be transcribed and may not be corrected upon review.

## 2017-10-15 NOTE — Telephone Encounter (Signed)
Scheduled appt per 8/5 los - pt to get an updated schedule in the treatment area.

## 2017-10-15 NOTE — Patient Instructions (Signed)
[facility] Discharge Instructions for Patients Receiving Chemotherapy  Today you received the following chemotherapy agents:  Keytruda.  To help prevent nausea and vomiting after your treatment, we encourage you to take your nausea medication as directed.   If you develop nausea and vomiting that is not controlled by your nausea medication, call the clinic.   BELOW ARE SYMPTOMS THAT SHOULD BE REPORTED IMMEDIATELY:  *FEVER GREATER THAN 100.5 F  *CHILLS WITH OR WITHOUT FEVER  NAUSEA AND VOMITING THAT IS NOT CONTROLLED WITH YOUR NAUSEA MEDICATION  *UNUSUAL SHORTNESS OF BREATH  *UNUSUAL BRUISING OR BLEEDING  TENDERNESS IN MOUTH AND THROAT WITH OR WITHOUT PRESENCE OF ULCERS  *URINARY PROBLEMS  *BOWEL PROBLEMS  UNUSUAL RASH Items with * indicate a potential emergency and should be followed up as soon as possible.  Feel free to call the clinic should you have any questions or concerns. The clinic phone number is [phone].  Please show the CHEMO ALERT CARD at check-in to the Emergency Department and triage nurse.    

## 2017-10-18 ENCOUNTER — Telehealth: Payer: Self-pay | Admitting: Medical Oncology

## 2017-10-18 NOTE — Telephone Encounter (Signed)
"  Please call and advised the patient that I increased her dose of levothyroxine to 75 mcg p.o. daily. "-Done

## 2017-10-23 ENCOUNTER — Ambulatory Visit (INDEPENDENT_AMBULATORY_CARE_PROVIDER_SITE_OTHER): Payer: Medicare Other | Admitting: Adult Health

## 2017-10-23 ENCOUNTER — Ambulatory Visit (INDEPENDENT_AMBULATORY_CARE_PROVIDER_SITE_OTHER)
Admission: RE | Admit: 2017-10-23 | Discharge: 2017-10-23 | Disposition: A | Discharge status: Home / Self Care | Payer: Medicare Other | Source: Ambulatory Visit | Attending: Adult Health | Admitting: Adult Health

## 2017-10-23 ENCOUNTER — Encounter: Payer: Self-pay | Admitting: Adult Health

## 2017-10-23 VITALS — BP 132/74 | HR 89 | Ht 67.5 in | Wt 159.0 lb

## 2017-10-23 DIAGNOSIS — C3411 Malignant neoplasm of upper lobe, right bronchus or lung: Secondary | ICD-10-CM | POA: Diagnosis not present

## 2017-10-23 DIAGNOSIS — C3491 Malignant neoplasm of unspecified part of right bronchus or lung: Secondary | ICD-10-CM

## 2017-10-23 DIAGNOSIS — J9611 Chronic respiratory failure with hypoxia: Secondary | ICD-10-CM

## 2017-10-23 DIAGNOSIS — J90 Pleural effusion, not elsewhere classified: Secondary | ICD-10-CM

## 2017-10-23 MED ORDER — FLUTTER DEVI: 0 refills | Status: AC

## 2017-10-23 NOTE — Assessment & Plan Note (Signed)
Right sided pleural effusion , suspected to be malignant . Improved after thoracentesis without reaccumulation .  Continue to monitor .

## 2017-10-23 NOTE — Assessment & Plan Note (Addendum)
Metastatic lung cancer with endobronchial tumor /mediastinal mass . Patient appears currently stable on therapy.  Chest x-ray today shows new decreased aeration of the right upper lobe/collapse .  Patient is clinically improved with decreased work of breathing and decreased oxygen demands.  She is tolerating her current therapy.  Unclear if this is secondary to disease progression versus possible mucous plugging as she has increased mucus thickness.  We will increase her pulmonary hygiene regimen with flutter valve nebulizers and mucolytic's have her follow back up in 7 to 10 days with a follow-up chest x-ray.  Patient is advised if she has increased symptoms or clinical deterioration she is to contact her office sooner.  We will repeat chest x-ray , if does not improve will need to consider repeat CT chest to evaluate for worsening disease progression.Marland Kitchen

## 2017-10-23 NOTE — Progress Notes (Signed)
@Patient  ID: Cassidy Sanders, female    DOB: 1940/03/25, 77 y.o.   MRN: [id number]  Chief Complaint  Patient presents with  . Follow-up    Pleural effusion    Referring provider: [name], MD  HPI: 77 yo female former smoker with Metastatic non-small cell lung cancer diagnosed November 2018. Recurrent pleural effusion -right (previous pleurx  4/-08/2017  ) (s/p chemo with Carboplatin, paciltaxel and Keytruda .  On Keytruda every 3 weeks currently  Initial presentation with large right suprahilar and mediastinal mass. She is followed by oncology undergoing palliative radiotherapy.  PMH significant for SVC syndrome dx fall 2018  Retired Marine scientist   TEST   CT chest February 03, 2017 5.8 paratracheal mediastinal mass causing marketed narrowing of the SVC with SVC syndrome resulting in anterior chest wall collaterals and collaterals via the heme azygous and intercostal veins of the mediastinal. Narrowing of the right main pulmonary artery and branches to the right upper lobe and luminal narrowing of the right mainstem bronchus.  PET scan March 12, 2017 shows a 6.0 cm low right paratracheal mass, encircling the right mainstem bronchus and bronchus intermedius and extending into the subcarinal region No findings specific for metastatic disease.. Small right pleural effusion.  03/2017 EBUS > endobronchial tumor extending from distal trachea into the right mainstem bronchus, right upper lobe was nearly completely occluded from tumor and scope was not able to pass through.  Bronchus intermedius was narrowed by mass.  CT chest September 21, 2017 showed improved aeration in the right upper lobe with presumed evolving radiation therapy effect, large right pleural effusion with right middle and right lower lobe collapse small left pleural effusion.  10/23/2017 Follow up : Metastatic lung cancer, right pleural effusion Patient returns for a one-month follow-up.  She has known metastatic lung  cancer with large right suprahilar and mediastinal mass with suspcious  malignant right pleural effusion.  Earlier this year she had a Pleurx catheter for recurrent right effusion.  This was removed in April.  Patient continues to follow with oncology and is on Keytruda IV every 3 weeks.  Last Beryle Flock was October 15, 2017 follow-up CT chest on July 12 showed improved aeration in the right upper lobe with a large right pleural effusion with right middle and right lower lobe collapse.  She underwent a repeated right thoracentesis on July 17 with 1.5 L removed.  Patient was seen in the office on October 04, 2017 chest x-ray showed decreased right pleural effusion and increased aeration of the right lung.  Since last visit patient is feeling okay , says breathing is doing better. No increased dyspnea.  O2 demands are decreased back to baseline at 2l/m (previously on 5l/m of oxygen ) .  Appetite is good. No hemoptysis , chest pain , or orthopnea. Continues to have daily cough , has noticed mucus is very thick at times. No fever or discolored mucus .  Chest xray today shows stable right pleural effusion . There is new complete opacification of RUL .    Allergies  Allergen Reactions  . Latex Dermatitis  . Gabapentin Other (See Comments)    Excessive sleep   . Tape Rash    Immunization History  Administered Date(s) Administered  . Influenza, High Dose Seasonal PF 12/26/2016  . Influenza-Unspecified 12/12/2011, 12/21/2014, 12/13/2015  . Pneumococcal Conjugate-13 12/11/2013  . Pneumococcal Polysaccharide-23 12/11/2012  . Td 03/13/1998    Past Medical History:  Diagnosis Date  . Arrhythmia   . Atrial fibrillation ([place])   .  Brain tumor (benign) ([place])   . Depression   . Dyspnea    home oxygen  . Dysrhythmia    a fib  . History of home oxygen therapy 02/2017   2 L/minute  . Hypertension     Tobacco History: Social History   Tobacco Use  Smoking Status Former Smoker  . Packs/day: 1.00  .  Years: 59.00  . Pack years: 59.00  . Types: Cigarettes  . Last attempt to quit: 06/11/2012  . Years since quitting: 5.3  Smokeless Tobacco Never Used   Counseling given: Not Answered   Outpatient Medications Prior to Visit  Medication Sig Dispense Refill  . albuterol (PROVENTIL) (2.5 MG/3ML) 0.083% nebulizer solution VVN Q 4 H PRF WHEEZING OR SHORTNESS OF BREATH  2  . Biotin 2.5 MG TABS Take 2,500 mcg by mouth daily.    . clobetasol cream (TEMOVATE) 2.35 % Apply 1 application topically 2 (two) times daily. 60 g 0  . diphenhydrAMINE (BENADRYL) 25 mg capsule Take 1 capsule (25 mg total) by mouth at bedtime as needed for sleep. 30 capsule 0  . feeding supplement, ENSURE ENLIVE, (ENSURE ENLIVE) LIQD Take 237 mLs by mouth 2 (two) times daily as needed (If patient or family requests, if pt unable to eat for a meal, or if intakes are poor). 237 mL 12  . hydrOXYzine (ATARAX/VISTARIL) 10 MG tablet Take 1 tablet (10 mg total) by mouth 3 (three) times daily as needed for itching. 30 tablet 0  . levalbuterol (XOPENEX) 0.63 MG/3ML nebulizer solution Take 3 mLs (0.63 mg total) by nebulization every 6 (six) hours as needed for wheezing or shortness of breath. 3 mL 12  . levothyroxine (SYNTHROID, LEVOTHROID) 75 MCG tablet TAKE 1 TABLET(75 MCG) BY MOUTH DAILY BEFORE BREAKFAST 90 tablet 0  . loperamide (IMODIUM) 2 MG capsule Take 2 mg by mouth as needed for diarrhea or loose stools.    . metoprolol tartrate (LOPRESSOR) 50 MG tablet Take 1 tablet (50 mg total) by mouth 2 (two) times daily. (Patient taking differently: Take 25 mg by mouth daily. ) 60 tablet 0  . oxyCODONE (OXY IR/ROXICODONE) 5 MG immediate release tablet Take 1 tablet by mouth every 2 (two) hours as needed.   0  . sertraline (ZOLOFT) 50 MG tablet Take 50 mg by mouth daily.    Marland Kitchen apixaban (ELIQUIS) 5 MG TABS tablet Take 1 tablet (5 mg total) by mouth 2 (two) times daily. (Patient not taking: Reported on 10/15/2017) 180 tablet 3   No  facility-administered medications prior to visit.      Review of Systems  Constitutional:   No  weight loss, night sweats,  Fevers, chills,  +fatigue, or  lassitude.  HEENT:   No headaches,  Difficulty swallowing,  Tooth/dental problems, or  Sore throat,                No sneezing, itching, ear ache, nasal congestion, post nasal drip,   CV:  No chest pain,  Orthopnea, PND, swelling in lower extremities, anasarca, dizziness, palpitations, syncope.   GI  No heartburn, indigestion, abdominal pain, nausea, vomiting, diarrhea, change in bowel habits, loss of appetite, bloody stools.   Resp  ,  No non-productive cough,  No coughing up of blood.  No change in color of mucus.  No wheezing.  No chest wall deformity  Skin: no rash or lesions.  GU: no dysuria, change in color of urine, no urgency or frequency.  No flank pain, no hematuria  MS:  No joint pain or swelling.  No decreased range of motion.  No back pain.    Physical Exam  BP 132/74 (BP Location: Left Arm, Cuff Size: Normal)   Pulse 89   Ht 5' 7.5" (1.715 m)   Wt 159 lb (72.1 kg)   SpO2 94%   BMI 24.54 kg/m   GEN: A/Ox3; pleasant , NAD, thin and elderly on O2    HEENT:  /AT,  EACs-clear, TMs-wnl, NOSE-clear, THROAT-clear, no lesions, no postnasal drip or exudate noted.   NECK:  Supple w/ fair ROM; no JVD; normal carotid impulses w/o bruits; no thyromegaly or nodules palpated; no lymphadenopathy.    RESP  Decreased BS on right ,   no accessory muscle use, no dullness to percussion  CARD:  RRR, no m/r/g, no peripheral edema, pulses intact, no cyanosis or clubbing.  GI:   Soft & nt; nml bowel sounds; no organomegaly or masses detected.   Musco: Warm bil, no deformities or joint swelling noted.   Neuro: alert, no focal deficits noted.    Skin: Warm, no lesions or rashes    Lab Results:  CBC  BMET  BNP No results found for: BNP  ProBNP No results found for: PROBNP  Imaging: Dg Chest 1 View  Result  Date: 09/26/2017 CLINICAL DATA:  Status post thoracentesis EXAM: CHEST  1 VIEW COMPARISON:  Chest radiograph July 04, 2017 and chest CT September 21, 2017 FINDINGS: No pneumothorax. There remains extensive pleural effusion on the right with consolidation on the right. There is associated volume loss on the right. The left lung is clear. Heart size and pulmonary vascular normal. No adenopathy. There is aortic atherosclerosis. No bone lesions. IMPRESSION: No pneumothorax. Pleural effusions somewhat smaller on the right following thoracentesis. There remains significant pleural effusion on the right with areas of consolidation and volume loss. Left lung clear. Stable cardiac silhouette. There is aortic atherosclerosis. Aortic Atherosclerosis (ICD10-I70.0). Electronically Signed   By: Lowella Grip III M.D.   On: 09/26/2017 11:22   Dg Chest 2 View  Result Date: 10/23/2017 CLINICAL DATA:  77 year old with RIGHT UPPER LOBE lung cancer, originally diagnosed in November, 2018, for which the patient received radiation therapy. Follow-up RIGHT pleural effusion identified on imaging examinations 1 month ago. EXAM: CHEST - 2 VIEW COMPARISON:  Chest x-rays 10/04/2017, 09/26/2017 and earlier. CT chest 09/21/2017, 06/27/2017 and earlier. PET-CT 03/12/2017. FINDINGS: Since the most recent prior examination 10/04/2017, interval complete opacification of the RIGHT UPPER LOBE, likely due to RIGHT UPPER LOBE bronchus obstruction by the central RIGHT UPPER LOBE lung cancer. Stable moderate-sized RIGHT pleural effusion with associated consolidation in the RIGHT LOWER LOBE and RIGHT MIDDLE LOBE. Prominent bronchovascular markings diffusely and moderate central peribronchial thickening involving the LEFT lung, unchanged. No new abnormalities in the LEFT lung. No LEFT pleural effusion. Cardiac silhouette normal in size, unchanged. Thoracic aorta tortuous and atherosclerotic, unchanged. Normal appearing LEFT hilum. Degenerative changes  throughout the thoracic and UPPER lumbar spine with remote prior compression fracture involving the UPPER endplate of W54. IMPRESSION: 1. Interval complete opacification of the RIGHT UPPER LOBE since 10/04/2017, likely due to obstruction by the central RIGHT UPPER LOBE lung cancer. 2. Stable moderate-sized RIGHT pleural effusion and associated passive atelectasis in the RIGHT LOWER LOBE and RIGHT MIDDLE LOBE. 3. Stable chronic bronchitis and/or asthma involving the LEFT lung. No new abnormalities involving the LEFT lung. Electronically Signed   By: Evangeline Dakin M.D.   On: 10/23/2017 10:13   Dg Chest  2 View  Addendum Date: 10/04/2017   ADDENDUM REPORT: 10/04/2017 16:26 ADDENDUM: The patient's previous studies did not appear in the PACS archive when originally interpreted. These are now available. When compared to the chest x-ray of 26 September 2017 the volume of pleural fluid on the right is slightly less. There is somewhat increased conspicuity of the right perihilar soft tissue masslike density. The large right pleural effusion visible on the CT images of September 21, 2017 is clearly smaller. Electronically Signed   By: David  Martinique M.D.   On: 10/04/2017 16:26   Result Date: 10/04/2017 CLINICAL DATA:  Follow-up of pleural effusion. Persistent shortness of breath. Former smoker. History of atrial fibrillation, hypertension, malignancy in the posterior mediastinum. EXAM: CHEST - 2 VIEW COMPARISON:  None in PACs FINDINGS: The left lung is well-expanded. The interstitial markings are mildly prominent inferiorly. There is mild pleural thickening in the left pulmonary apex. On the right there is volume loss. There is confluent airspace opacity in the right perihilar region. There is pleural thickening versus pleural fluid surrounding the periphery of the right lung. There is increased density at the right lung base. The left heart border is normal. The right heart border is obscured. There is calcification in the wall  of the thoracic aorta. There is multilevel degenerative disc disease of the thoracic spine. IMPRESSION: Abnormal soft tissue masslike density in the right hilar region extending into the posterior inferior aspect of the right upper lobe. Probable pleural fluid surrounding the right lung and obscuring the right hemidiaphragm. Right basilar infiltrate or atelectasis is not excluded. Thoracic aortic atherosclerosis. Electronically Signed: By: David  Martinique M.D. On: 10/04/2017 15:50   US Thoracentesis Asp Pleural Space W/img Guide  Result Date: 09/26/2017 INDICATION: Non-small cell lung cancer. Right pleural effusion. Request for therapeutic thoracentesis. EXAM: ULTRASOUND GUIDED RIGHT THORACENTESIS MEDICATIONS: 1% Lidocaine = 10 mL COMPLICATIONS: None immediate. PROCEDURE: An ultrasound guided thoracentesis was thoroughly discussed with the patient and questions answered. The benefits, risks, alternatives and complications were also discussed. The patient understands and wishes to proceed with the procedure. Written consent was obtained. Ultrasound was performed to localize and mark an adequate pocket of fluid in the right chest. The area was then prepped and draped in the normal sterile fashion. 1% Lidocaine was used for local anesthesia. Under ultrasound guidance a 6 Fr Safe-T-Centesis catheter was introduced. Thoracentesis was performed. The catheter was removed and a dressing applied. FINDINGS: A total of approximately 1.5 liters of clear yellow fluid was removed. IMPRESSION: Successful ultrasound guided right thoracentesis yielding 1.5 liters of pleural fluid. No pneumothorax on post procedure chest X-ray. Read by: Gareth Eagle, PA-C Electronically Signed   By: Markus Daft M.D.   On: 09/26/2017 11:32     Assessment & Plan:   Chronic respiratory failure with hypoxia (HCC) O2 demands are improved , now down to 2l/m with o2 sats maintaining >90%.  Cont on o2 .   Pleural effusion on right Right sided  pleural effusion , suspected to be malignant . Improved after thoracentesis without reaccumulation .  Continue to monitor .   Stage IV squamous cell carcinoma of right lung (HCC) Metastatic lung cancer with endobronchial tumor /mediastinal mass . Patient appears currently stable on therapy.  Chest x-ray today shows new decreased aeration of the right upper lobe/collapse .  Patient is clinically improved with decreased work of breathing and decreased oxygen demands.  She is tolerating her current therapy.  Unclear if this is secondary to disease progression  versus possible mucous plugging as she has increased mucus thickness.  We will increase her pulmonary hygiene regimen with flutter valve nebulizers and mucolytic's have her follow back up in 7 to 10 days with a follow-up chest x-ray.  Patient is advised if she has increased symptoms or clinical deterioration she is to contact her office sooner.  We will repeat chest x-ray , if does not improve will need to consider repeat CT chest to evaluate for worsening disease progression.Rexene Edison, NP 10/23/2017

## 2017-10-23 NOTE — Assessment & Plan Note (Signed)
O2 demands are improved , now down to 2l/m with o2 sats maintaining >90%.  Cont on o2 .

## 2017-10-23 NOTE — Patient Instructions (Signed)
Increase Xopenex Neb Twice daily  .  Begin Flutter valve 2-3 times a day .  Mucinex Twice daily  As needed for cough and congestion.  Continue on Oxygen 2l/m .  Follow up with Dr. Lake Bells on 11/02/17 with chest xray and As needed   Please contact office for sooner follow up if symptoms do not improve or worsen or seek emergency care

## 2017-10-30 NOTE — Progress Notes (Signed)
Reviewed, agree 

## 2017-11-02 ENCOUNTER — Encounter: Payer: Self-pay | Admitting: Pulmonary Disease

## 2017-11-02 ENCOUNTER — Ambulatory Visit (INDEPENDENT_AMBULATORY_CARE_PROVIDER_SITE_OTHER)
Admission: RE | Admit: 2017-11-02 | Discharge: 2017-11-02 | Disposition: A | Discharge status: Home / Self Care | Payer: Medicare Other | Source: Ambulatory Visit | Attending: Pulmonary Disease | Admitting: Pulmonary Disease

## 2017-11-02 ENCOUNTER — Ambulatory Visit (INDEPENDENT_AMBULATORY_CARE_PROVIDER_SITE_OTHER): Payer: Medicare Other | Admitting: Pulmonary Disease

## 2017-11-02 ENCOUNTER — Other Ambulatory Visit: Payer: Self-pay | Admitting: Pulmonary Disease

## 2017-11-02 VITALS — BP 132/84 | HR 90 | Wt 162.0 lb

## 2017-11-02 DIAGNOSIS — J90 Pleural effusion, not elsewhere classified: Secondary | ICD-10-CM

## 2017-11-02 DIAGNOSIS — C3490 Malignant neoplasm of unspecified part of unspecified bronchus or lung: Secondary | ICD-10-CM | POA: Diagnosis not present

## 2017-11-02 NOTE — Patient Instructions (Signed)
Pleural effusion on the right, malignant: We will repeat a CT scan of the chest If that shows that the pleural fluid is increasing in size then we need to reinsert a Pleurx catheter.  If we have to do that we will try to figure out if we can use an alternative adhesive regimen than the one that was used prior.  Small cell lung cancer: Keep follow-up with Dr. Earlie Server  Chronic respiratory failure with hypoxemia: Keep using oxygen as you are doing  We will call you after we seen the results of next week CT scan  Otherwise, follow-up with [place] in 3 to 4 weeks

## 2017-11-02 NOTE — Progress Notes (Signed)
Subjective:   PATIENT ID: Cassidy Sanders GENDER: female DOB: 1940/07/08, MRN: [id number]  Synopsis: Diagnosed with non-small cell lung cancer and SVC syndrome in the fall of 2018  HPI  Chief Complaint  Patient presents with  . Follow-up   Dannielle says that she was coughing up more mucus while taking guaifenesin and the nebulizers prescribed by Tammy..  She thinks that her breathing has improved slightly.  She was coughing up yellow mucus.  She says that she uses anywhere between 2 and 8 L of oxygen depending on what she is doing.  She wears it continuously and feels that it helps her breathing.  She has not had fevers chills or chest pain.  Her weight has been stable.  In general she feels like she is doing fairly well.  She tells me that she has no shortness of breath and does not feel limited by breathing.  Past Medical History:  Diagnosis Date  . Arrhythmia   . Atrial fibrillation ([place])   . Brain tumor (benign) ([place])   . Depression   . Dyspnea    home oxygen  . Dysrhythmia    a fib  . History of home oxygen therapy 02/2017   2 L/minute  . Hypertension      Review of Systems  Constitutional: Positive for malaise/fatigue.  HENT: Negative for congestion and ear discharge.   Respiratory: Positive for shortness of breath. Negative for cough, sputum production, wheezing and stridor.   Cardiovascular: Negative for claudication and leg swelling.  Neurological: Positive for weakness.      Objective:  Physical Exam   Vitals:   11/02/17 1146  BP: 132/84  Pulse: 90  SpO2: 93%  Weight: 162 lb (73.5 kg)   2L [place]  Gen: well appearing HENT: OP clear, TM's clear, neck supple PULM: No air movement on right, clear on left, some wheezing right upper lobe,  CV: RRR, no mgr, trace edema GI: BS+, soft, nontender Derm: no cyanosis or rash Psyche: normal mood and affect    CBC    Component Value Date/Time   WBC 5.6 10/15/2017 1247   WBC 7.1 08/28/2017 1239   RBC 4.12  10/15/2017 1247   HGB 11.0 (L) 10/15/2017 1247   HGB 11.8 03/15/2017 1436   HCT 33.9 (L) 10/15/2017 1247   HCT 37.8 03/15/2017 1436   PLT 312 10/15/2017 1247   PLT 215 03/15/2017 1436   MCV 82.2 10/15/2017 1247   MCV 92.4 03/15/2017 1436   MCH 26.6 10/15/2017 1247   MCHC 32.3 10/15/2017 1247   RDW 16.0 (H) 10/15/2017 1247   RDW 15.3 (H) 03/15/2017 1436   LYMPHSABS 0.8 (L) 10/15/2017 1247   LYMPHSABS 1.8 03/15/2017 1436   MONOABS 0.5 10/15/2017 1247   MONOABS 0.7 03/15/2017 1436   EOSABS 0.1 10/15/2017 1247   EOSABS 0.1 03/15/2017 1436   BASOSABS 0.0 10/15/2017 1247   BASOSABS 0.0 03/15/2017 1436     Chest imaging: Multiple chest x-ray images reviewed.  Specifically the one from today shows that there is improved aeration in the right upper lobe near the mass but I worry that there is an increased pleural effusion.  Radiology says that the pleural effusion is stable as is the pleural thickening.  PFT:  Labs:  Path:  Echo:  Heart Catheterization:  Notes from her visit with Dr. Julien Nordmann reviewed, she is undergoing treatment of metastatic non-small cell lung cancer with XRT and chemo.  She saw a PA about a week ago  for shortness of breath and was prescribed prednisone and albuterol.     Assessment & Plan:   No diagnosis found.  Discussion: All things considered Teah continues to do fairly well despite her high burden of disease from her large small cell lung cancer and chronic respiratory failure with hypoxemia: She has improved aeration in the right upper lobe with pulmonary toilet measures which is good but I am concerned that the right-sided pleural effusion is starting to come back.  Because of the discrepancy between my interpretation and that of radiology I think the best next step is to get a CT scan of the chest.  Plan: Pleural effusion on the right, malignant: We will repeat a CT scan of the chest If that shows that the pleural fluid is increasing in size then  we need to reinsert a Pleurx catheter.  If we have to do that we will try to figure out if we can use an alternative adhesive regimen than the one that was used prior.  Small cell lung cancer: Keep follow-up with Dr. Earlie Server  Chronic respiratory failure with hypoxemia: Keep using oxygen as you are doing  We will call you after we seen the results of next week CT scan  Otherwise, follow-up with [place] in 3 to 4 weeks    Current Outpatient Medications:  .  albuterol (PROVENTIL) (2.5 MG/3ML) 0.083% nebulizer solution, VVN Q 4 H PRF WHEEZING OR SHORTNESS OF BREATH, Disp: , Rfl: 2 .  Biotin 2.5 MG TABS, Take 2,500 mcg by mouth daily., Disp: , Rfl:  .  clobetasol cream (TEMOVATE) 3.30 %, Apply 1 application topically 2 (two) times daily., Disp: 60 g, Rfl: 0 .  diphenhydrAMINE (BENADRYL) 25 mg capsule, Take 1 capsule (25 mg total) by mouth at bedtime as needed for sleep., Disp: 30 capsule, Rfl: 0 .  feeding supplement, ENSURE ENLIVE, (ENSURE ENLIVE) LIQD, Take 237 mLs by mouth 2 (two) times daily as needed (If patient or family requests, if pt unable to eat for a meal, or if intakes are poor)., Disp: 237 mL, Rfl: 12 .  hydrOXYzine (ATARAX/VISTARIL) 10 MG tablet, Take 1 tablet (10 mg total) by mouth 3 (three) times daily as needed for itching., Disp: 30 tablet, Rfl: 0 .  levalbuterol (XOPENEX) 0.63 MG/3ML nebulizer solution, Take 3 mLs (0.63 mg total) by nebulization every 6 (six) hours as needed for wheezing or shortness of breath., Disp: 3 mL, Rfl: 12 .  levothyroxine (SYNTHROID, LEVOTHROID) 75 MCG tablet, TAKE 1 TABLET(75 MCG) BY MOUTH DAILY BEFORE BREAKFAST, Disp: 90 tablet, Rfl: 0 .  loperamide (IMODIUM) 2 MG capsule, Take 2 mg by mouth as needed for diarrhea or loose stools., Disp: , Rfl:  .  metoprolol tartrate (LOPRESSOR) 50 MG tablet, Take 1 tablet (50 mg total) by mouth 2 (two) times daily. (Patient taking differently: Take 25 mg by mouth daily. ), Disp: 60 tablet, Rfl: 0 .  oxyCODONE (OXY  IR/ROXICODONE) 5 MG immediate release tablet, Take 1 tablet by mouth every 2 (two) hours as needed. , Disp: , Rfl: 0 .  Respiratory Therapy Supplies (FLUTTER) DEVI, Use as directed., Disp: 1 each, Rfl: 0 .  sertraline (ZOLOFT) 50 MG tablet, Take 50 mg by mouth daily., Disp: , Rfl:

## 2017-11-06 ENCOUNTER — Inpatient Hospital Stay: Payer: Medicare Other

## 2017-11-06 ENCOUNTER — Inpatient Hospital Stay (HOSPITAL_BASED_OUTPATIENT_CLINIC_OR_DEPARTMENT_OTHER): Payer: Medicare Other | Admitting: Internal Medicine

## 2017-11-06 ENCOUNTER — Telehealth: Payer: Self-pay

## 2017-11-06 ENCOUNTER — Encounter: Payer: Self-pay | Admitting: Internal Medicine

## 2017-11-06 VITALS — BP 126/77 | HR 93 | Temp 97.6°F | Resp 18 | Ht 67.5 in | Wt 164.4 lb

## 2017-11-06 DIAGNOSIS — Z5112 Encounter for antineoplastic immunotherapy: Secondary | ICD-10-CM

## 2017-11-06 DIAGNOSIS — C3491 Malignant neoplasm of unspecified part of right bronchus or lung: Secondary | ICD-10-CM

## 2017-11-06 DIAGNOSIS — C382 Malignant neoplasm of posterior mediastinum: Secondary | ICD-10-CM

## 2017-11-06 DIAGNOSIS — R0609 Other forms of dyspnea: Secondary | ICD-10-CM

## 2017-11-06 DIAGNOSIS — R5382 Chronic fatigue, unspecified: Secondary | ICD-10-CM

## 2017-11-06 DIAGNOSIS — Z79899 Other long term (current) drug therapy: Secondary | ICD-10-CM | POA: Diagnosis not present

## 2017-11-06 DIAGNOSIS — Z87891 Personal history of nicotine dependence: Secondary | ICD-10-CM | POA: Diagnosis not present

## 2017-11-06 DIAGNOSIS — I10 Essential (primary) hypertension: Secondary | ICD-10-CM

## 2017-11-06 DIAGNOSIS — I4891 Unspecified atrial fibrillation: Secondary | ICD-10-CM | POA: Diagnosis not present

## 2017-11-06 LAB — CMP (CANCER CENTER ONLY)
ALBUMIN: 2.7 g/dL — AB (ref 3.5–5.0)
ALT: 12 U/L (ref 0–44)
AST: 15 U/L (ref 15–41)
Alkaline Phosphatase: 121 U/L (ref 38–126)
Anion gap: 8 (ref 5–15)
BUN: 13 mg/dL (ref 8–23)
CHLORIDE: 100 mmol/L (ref 98–111)
CO2: 33 mmol/L — AB (ref 22–32)
Calcium: 9.5 mg/dL (ref 8.9–10.3)
Creatinine: 0.69 mg/dL (ref 0.44–1.00)
GFR, Est AFR Am: >60 mL/min (ref >60)
GFR, Estimated: >60 mL/min (ref >60)
GLUCOSE: 119 mg/dL — AB (ref 70–99)
POTASSIUM: 3.6 mmol/L (ref 3.5–5.1)
SODIUM: 141 mmol/L (ref 135–145)
Total Bilirubin: 0.5 mg/dL (ref 0.3–1.2)
Total Protein: 7.3 g/dL (ref 6.5–8.1)

## 2017-11-06 LAB — CBC WITH DIFFERENTIAL (CANCER CENTER ONLY)
Basophils Absolute: 0 10*3/uL (ref 0.0–0.1)
Basophils Relative: 0 %
EOS PCT: 1 %
Eosinophils Absolute: 0.1 10*3/uL (ref 0.0–0.5)
HEMATOCRIT: 34.7 % — AB (ref 34.8–46.6)
Hemoglobin: 10.6 g/dL — ABNORMAL LOW (ref 11.6–15.9)
LYMPHS ABS: 0.8 10*3/uL — AB (ref 0.9–3.3)
LYMPHS PCT: 12 %
MCH: 26.8 pg (ref 25.1–34.0)
MCHC: 30.5 g/dL — ABNORMAL LOW (ref 31.5–36.0)
MCV: 87.6 fL (ref 79.5–101.0)
MONO ABS: 0.9 10*3/uL (ref 0.1–0.9)
Monocytes Relative: 12 %
Neutro Abs: 5.2 10*3/uL (ref 1.5–6.5)
Neutrophils Relative %: 75 %
PLATELETS: 216 10*3/uL (ref 145–400)
RBC: 3.96 MIL/uL (ref 3.70–5.45)
RDW: 16.3 % — AB (ref 11.2–14.5)
WBC Count: 7 10*3/uL (ref 3.9–10.3)

## 2017-11-06 LAB — TSH: TSH: 15.343 u[IU]/mL — ABNORMAL HIGH (ref 0.308–3.960)

## 2017-11-06 MED ORDER — SODIUM CHLORIDE 0.9 % IV SOLN
Freq: Once | INTRAVENOUS | Status: AC
  Administered 2017-11-06: 15:00:00 via INTRAVENOUS
  Filled 2017-11-06: qty 250

## 2017-11-06 MED ORDER — SODIUM CHLORIDE 0.9 % IV SOLN
200.0000 mg | Freq: Once | INTRAVENOUS | Status: AC
  Administered 2017-11-06: 200 mg via INTRAVENOUS
  Filled 2017-11-06: qty 8

## 2017-11-06 NOTE — Patient Instructions (Signed)
[facility] Discharge Instructions for Patients Receiving Chemotherapy  Today you received the following chemotherapy agents:  Keytruda.  To help prevent nausea and vomiting after your treatment, we encourage you to take your nausea medication as directed.   If you develop nausea and vomiting that is not controlled by your nausea medication, call the clinic.   BELOW ARE SYMPTOMS THAT SHOULD BE REPORTED IMMEDIATELY:  *FEVER GREATER THAN 100.5 F  *CHILLS WITH OR WITHOUT FEVER  NAUSEA AND VOMITING THAT IS NOT CONTROLLED WITH YOUR NAUSEA MEDICATION  *UNUSUAL SHORTNESS OF BREATH  *UNUSUAL BRUISING OR BLEEDING  TENDERNESS IN MOUTH AND THROAT WITH OR WITHOUT PRESENCE OF ULCERS  *URINARY PROBLEMS  *BOWEL PROBLEMS  UNUSUAL RASH Items with * indicate a potential emergency and should be followed up as soon as possible.  Feel free to call the clinic should you have any questions or concerns. The clinic phone number is [phone].  Please show the CHEMO ALERT CARD at check-in to the Emergency Department and triage nurse.    

## 2017-11-06 NOTE — Telephone Encounter (Signed)
Printed avs and calender of upcoming appointment. Called CT and had patient Scan moved closer to her r/v. Per 8/27 los

## 2017-11-06 NOTE — Progress Notes (Signed)
[place] [phone]   [phone]  OFFICE PROGRESS NOTE  [name], MD [address] [place] [address]  DIAGNOSIS: Metastatic non-small cell carcinoma (T3, N2, M1a) non-small cell lung cancer diagnosed in November 2018 and presented with large right suprahilar and mediastinal mass as well as recent right pleural effusion.  Guardant 360: No actionable mutations.  PRIOR THERAPY:  1) Palliative radiotherapy to the large mediastinal mass under the care of Dr. Lisbeth Renshaw. 2) Systemic chemotherapy with carboplatin for AUC of 5, paclitaxel 175 mg/M2 and Keytruda 200 mg IV every 3 weeks.  First dose expected May 01, 2017.  Status post 3 cycles.  CURRENT THERAPY: Maintenance treatment with immunotherapy with single agent Keytruda 200 mg IV every 3 weeks.  First cycle 07/24/2017.  Status post 5 cycles.  INTERVAL HISTORY: Cassidy Sanders 77 y.o. female returns to the clinic today for follow-up visit accompanied by her daughter.  The patient is feeling fine today with no concerning complaints.  She denied having any chest pain but has mild shortness of breath with exertion with no cough or hemoptysis.  She has no fever or chills.  She has no nausea, vomiting, diarrhea or constipation.  She denied having any recent weight loss or night sweats.  She was seen recently by Dr. Lake Bells for shortness of breath and she underwent thoracentesis with improvement of her condition.  He is scheduled her for repeat CT scan of the chest on November 13, 2017.  MEDICAL HISTORY: Past Medical History:  Diagnosis Date  . Arrhythmia   . Atrial fibrillation ([place])   . Brain tumor (benign) ([place])   . Depression   . Dyspnea    home oxygen  . Dysrhythmia    a fib  . History of home oxygen therapy 02/2017   2 L/minute  . Hypertension     ALLERGIES:  is allergic to latex; gabapentin; and tape.  MEDICATIONS:  Current Outpatient Medications  Medication Sig Dispense  Refill  . albuterol (PROVENTIL) (2.5 MG/3ML) 0.083% nebulizer solution VVN Q 4 H PRF WHEEZING OR SHORTNESS OF BREATH  2  . Biotin 2.5 MG TABS Take 2,500 mcg by mouth daily.    . clobetasol cream (TEMOVATE) 6.54 % Apply 1 application topically 2 (two) times daily. 60 g 0  . diphenhydrAMINE (BENADRYL) 25 mg capsule Take 1 capsule (25 mg total) by mouth at bedtime as needed for sleep. 30 capsule 0  . feeding supplement, ENSURE ENLIVE, (ENSURE ENLIVE) LIQD Take 237 mLs by mouth 2 (two) times daily as needed (If patient or family requests, if pt unable to eat for a meal, or if intakes are poor). 237 mL 12  . hydrOXYzine (ATARAX/VISTARIL) 10 MG tablet Take 1 tablet (10 mg total) by mouth 3 (three) times daily as needed for itching. 30 tablet 0  . levalbuterol (XOPENEX) 0.63 MG/3ML nebulizer solution Take 3 mLs (0.63 mg total) by nebulization every 6 (six) hours as needed for wheezing or shortness of breath. 3 mL 12  . levothyroxine (SYNTHROID, LEVOTHROID) 75 MCG tablet TAKE 1 TABLET(75 MCG) BY MOUTH DAILY BEFORE BREAKFAST 90 tablet 0  . loperamide (IMODIUM) 2 MG capsule Take 2 mg by mouth as needed for diarrhea or loose stools.    . metoprolol tartrate (LOPRESSOR) 50 MG tablet Take 1 tablet (50 mg total) by mouth 2 (two) times daily. (Patient taking differently: Take 25 mg by mouth daily. ) 60 tablet 0  . oxyCODONE (OXY IR/ROXICODONE) 5 MG  immediate release tablet Take 1 tablet by mouth every 2 (two) hours as needed.   0  . Respiratory Therapy Supplies (FLUTTER) DEVI Use as directed. 1 each 0  . sertraline (ZOLOFT) 50 MG tablet Take 50 mg by mouth daily.     No current facility-administered medications for this visit.     SURGICAL HISTORY:  Past Surgical History:  Procedure Laterality Date  . ABDOMINAL HYSTERECTOMY    . BLADDER SURGERY    . BRAIN SURGERY    . ENDOBRONCHIAL ULTRASOUND Bilateral 02/06/2017   Procedure: ENDOBRONCHIAL ULTRASOUND;  Surgeon: Juanito Doom, MD;  Location: [facility]  ENDOSCOPY;  Service: Cardiopulmonary;  Laterality: Bilateral;  . IR GUIDED DRAIN W CATHETER PLACEMENT  06/29/2017  . IR REMOVAL OF PLURAL CATH W/CUFF  08/28/2017  . IR US GUIDE BX ASP/DRAIN  06/29/2017  . VIDEO BRONCHOSCOPY WITH ENDOBRONCHIAL ULTRASOUND N/A 03/23/2017   Procedure: VIDEO BRONCHOSCOPY WITH ENDOBRONCHIAL ULTRASOUND;  Surgeon: Juanito Doom, MD;  Location: [facility] OR;  Service: Thoracic;  Laterality: N/A;    REVIEW OF SYSTEMS:  A comprehensive review of systems was negative except for: Constitutional: positive for fatigue Respiratory: positive for dyspnea on exertion   PHYSICAL EXAMINATION: General appearance: alert, cooperative, fatigued and no distress Head: Normocephalic, without obvious abnormality, atraumatic Neck: no adenopathy, no JVD, supple, symmetrical, trachea midline and thyroid not enlarged, symmetric, no tenderness/mass/nodules Lymph nodes: Cervical, supraclavicular, and axillary nodes normal. Resp: clear to auscultation bilaterally Back: symmetric, no curvature. ROM normal. No CVA tenderness. Cardio: regular rate and rhythm, S1, S2 normal, no murmur, click, rub or gallop GI: soft, non-tender; bowel sounds normal; no masses,  no organomegaly Extremities: extremities normal, atraumatic, no cyanosis or edema  ECOG PERFORMANCE STATUS: 1 - Symptomatic but completely ambulatory  Blood pressure 126/77, pulse 93, temperature 97.6 F (36.4 C), temperature source Oral, resp. rate 18, height 5' 7.5" (1.715 m), weight 164 lb 6.4 oz (74.6 kg), SpO2 90 %.  LABORATORY DATA: Lab Results  Component Value Date   WBC 7.0 11/06/2017   HGB 10.6 (L) 11/06/2017   HCT 34.7 (L) 11/06/2017   MCV 87.6 11/06/2017   PLT 216 11/06/2017      Chemistry      Component Value Date/Time   NA 142 10/15/2017 1247   NA 143 03/15/2017 1436   K 3.8 10/15/2017 1247   K 3.7 03/15/2017 1436   CL 102 10/15/2017 1247   CO2 29 10/15/2017 1247   CO2 34 (H) 03/15/2017 1436   BUN 9 10/15/2017  1247   BUN 17.8 03/15/2017 1436   CREATININE 0.72 10/15/2017 1247   CREATININE 0.8 03/15/2017 1436      Component Value Date/Time   CALCIUM 9.2 10/15/2017 1247   CALCIUM 9.6 03/15/2017 1436   ALKPHOS 112 10/15/2017 1247   ALKPHOS 88 03/15/2017 1436   AST 17 10/15/2017 1247   AST 16 03/15/2017 1436   ALT 11 10/15/2017 1247   ALT 15 03/15/2017 1436   BILITOT 0.5 10/15/2017 1247   BILITOT 0.52 03/15/2017 1436       RADIOGRAPHIC STUDIES: Dg Chest 2 View  Result Date: 11/02/2017 CLINICAL DATA:  Follow-up right-sided pleural effusion. EXAM: CHEST - 2 VIEW COMPARISON:  10/23/2017. FINDINGS: Heart size normal. Decreased size in right hilar soft tissue prominence. Persistent right lung infiltrate. Persistent right pleural effusion and pleural thickening. No pneumothorax. No acute bony abnormality. IMPRESSION: 1. Decreased size in right hilar soft tissue prominence. Interim improvement right upper lobe atelectasis. 2.  Persistent right-sided pleural  thickening. Electronically Signed   By: Marcello Moores  Register   On: 11/02/2017 11:42   Dg Chest 2 View  Result Date: 10/23/2017 CLINICAL DATA:  77 year old with RIGHT UPPER LOBE lung cancer, originally diagnosed in November, 2018, for which the patient received radiation therapy. Follow-up RIGHT pleural effusion identified on imaging examinations 1 month ago. EXAM: CHEST - 2 VIEW COMPARISON:  Chest x-rays 10/04/2017, 09/26/2017 and earlier. CT chest 09/21/2017, 06/27/2017 and earlier. PET-CT 03/12/2017. FINDINGS: Since the most recent prior examination 10/04/2017, interval complete opacification of the RIGHT UPPER LOBE, likely due to RIGHT UPPER LOBE bronchus obstruction by the central RIGHT UPPER LOBE lung cancer. Stable moderate-sized RIGHT pleural effusion with associated consolidation in the RIGHT LOWER LOBE and RIGHT MIDDLE LOBE. Prominent bronchovascular markings diffusely and moderate central peribronchial thickening involving the LEFT lung, unchanged.  No new abnormalities in the LEFT lung. No LEFT pleural effusion. Cardiac silhouette normal in size, unchanged. Thoracic aorta tortuous and atherosclerotic, unchanged. Normal appearing LEFT hilum. Degenerative changes throughout the thoracic and UPPER lumbar spine with remote prior compression fracture involving the UPPER endplate of O13. IMPRESSION: 1. Interval complete opacification of the RIGHT UPPER LOBE since 10/04/2017, likely due to obstruction by the central RIGHT UPPER LOBE lung cancer. 2. Stable moderate-sized RIGHT pleural effusion and associated passive atelectasis in the RIGHT LOWER LOBE and RIGHT MIDDLE LOBE. 3. Stable chronic bronchitis and/or asthma involving the LEFT lung. No new abnormalities involving the LEFT lung. Electronically Signed   By: Evangeline Dakin M.D.   On: 10/23/2017 10:13    ASSESSMENT AND PLAN: This is a very pleasant 77 years old white female recently diagnosed with probably stage IV non-small cell lung cancer presented with large right suprahilar lung mass in addition to mediastinal lymphadenopathy and suspicious malignant right pleural effusion.  The patient underwent systemic chemotherapy with carboplatin, paclitaxel and Keytruda status post 3 cycles.  She was tolerating this treatment well with no concerning complaints except for the last cycle when she was admitted to the hospital with C. difficile as well as dehydration and renal insufficiency.   She is currently on treatment with single agent Ketruda (pembrolizumab) status post 5 cycles.  She continues to tolerate her treatment well. I recommended for her to proceed with cycle #6 today. I will change her scheduled scan from December 10, 2017 to November 26, 2017 before the next cycle of her treatment since the patient is doing fine and currently asymptomatic. She was advised to call immediately if she has any concerning symptoms in the interval. The patient voices understanding of current disease status and  treatment options and is in agreement with the current care plan. All questions were answered. The patient knows to call the clinic with any problems, questions or concerns. We can certainly see the patient much sooner if necessary. I spent 10 minutes counseling the patient face to face. The total time spent in the appointment was 15 minutes.   Disclaimer: This note was dictated with voice recognition software. Similar sounding words can inadvertently be transcribed and may not be corrected upon review.

## 2017-11-13 ENCOUNTER — Ambulatory Visit (HOSPITAL_COMMUNITY): Payer: Medicare Other

## 2017-11-14 ENCOUNTER — Ambulatory Visit: Payer: Medicare Other | Admitting: Pulmonary Disease

## 2017-11-23 ENCOUNTER — Other Ambulatory Visit: Payer: Medicare Other

## 2017-11-23 ENCOUNTER — Ambulatory Visit (HOSPITAL_COMMUNITY)
Admission: RE | Admit: 2017-11-23 | Discharge: 2017-11-23 | Disposition: A | Discharge status: Home / Self Care | Payer: Medicare Other | Source: Ambulatory Visit | Attending: Pulmonary Disease | Admitting: Pulmonary Disease

## 2017-11-23 DIAGNOSIS — Z5111 Encounter for antineoplastic chemotherapy: Secondary | ICD-10-CM | POA: Diagnosis not present

## 2017-11-23 DIAGNOSIS — C3490 Malignant neoplasm of unspecified part of unspecified bronchus or lung: Secondary | ICD-10-CM

## 2017-11-23 DIAGNOSIS — J9811 Atelectasis: Secondary | ICD-10-CM | POA: Insufficient documentation

## 2017-11-23 DIAGNOSIS — I2510 Atherosclerotic heart disease of native coronary artery without angina pectoris: Secondary | ICD-10-CM | POA: Diagnosis not present

## 2017-11-23 DIAGNOSIS — J90 Pleural effusion, not elsewhere classified: Secondary | ICD-10-CM | POA: Diagnosis not present

## 2017-11-23 DIAGNOSIS — I700 Atherosclerosis of aorta: Secondary | ICD-10-CM | POA: Insufficient documentation

## 2017-11-23 DIAGNOSIS — Z923 Personal history of irradiation: Secondary | ICD-10-CM | POA: Insufficient documentation

## 2017-11-23 DIAGNOSIS — Z85118 Personal history of other malignant neoplasm of bronchus and lung: Secondary | ICD-10-CM | POA: Diagnosis not present

## 2017-11-27 ENCOUNTER — Inpatient Hospital Stay (HOSPITAL_BASED_OUTPATIENT_CLINIC_OR_DEPARTMENT_OTHER): Payer: Medicare Other | Admitting: Internal Medicine

## 2017-11-27 ENCOUNTER — Telehealth: Payer: Self-pay

## 2017-11-27 ENCOUNTER — Inpatient Hospital Stay: Payer: Medicare Other | Attending: Internal Medicine

## 2017-11-27 ENCOUNTER — Other Ambulatory Visit: Payer: Self-pay | Admitting: Internal Medicine

## 2017-11-27 ENCOUNTER — Encounter: Payer: Self-pay | Admitting: Internal Medicine

## 2017-11-27 ENCOUNTER — Inpatient Hospital Stay: Payer: Medicare Other

## 2017-11-27 ENCOUNTER — Encounter: Payer: Self-pay | Admitting: *Deleted

## 2017-11-27 VITALS — BP 137/79 | HR 92 | Temp 98.0°F | Resp 18 | Ht 67.5 in | Wt 166.4 lb

## 2017-11-27 DIAGNOSIS — Z5112 Encounter for antineoplastic immunotherapy: Secondary | ICD-10-CM

## 2017-11-27 DIAGNOSIS — C382 Malignant neoplasm of posterior mediastinum: Secondary | ICD-10-CM

## 2017-11-27 DIAGNOSIS — Z9981 Dependence on supplemental oxygen: Secondary | ICD-10-CM

## 2017-11-27 DIAGNOSIS — C3491 Malignant neoplasm of unspecified part of right bronchus or lung: Secondary | ICD-10-CM | POA: Insufficient documentation

## 2017-11-27 DIAGNOSIS — Z79899 Other long term (current) drug therapy: Secondary | ICD-10-CM | POA: Insufficient documentation

## 2017-11-27 DIAGNOSIS — R5382 Chronic fatigue, unspecified: Secondary | ICD-10-CM

## 2017-11-27 DIAGNOSIS — R0602 Shortness of breath: Secondary | ICD-10-CM

## 2017-11-27 DIAGNOSIS — I10 Essential (primary) hypertension: Secondary | ICD-10-CM

## 2017-11-27 LAB — CBC WITH DIFFERENTIAL (CANCER CENTER ONLY)
BASOS ABS: 0 10*3/uL (ref 0.0–0.1)
BASOS PCT: 0 %
Eosinophils Absolute: 0.1 10*3/uL (ref 0.0–0.5)
Eosinophils Relative: 2 %
HEMATOCRIT: 38.8 % (ref 34.8–46.6)
HEMOGLOBIN: 11.9 g/dL (ref 11.6–15.9)
Lymphocytes Relative: 16 %
Lymphs Abs: 1 10*3/uL (ref 0.9–3.3)
MCH: 26.7 pg (ref 25.1–34.0)
MCHC: 30.7 g/dL — ABNORMAL LOW (ref 31.5–36.0)
MCV: 87.2 fL (ref 79.5–101.0)
Monocytes Absolute: 0.6 10*3/uL (ref 0.1–0.9)
Monocytes Relative: 10 %
NEUTROS ABS: 4.4 10*3/uL (ref 1.5–6.5)
NEUTROS PCT: 72 %
Platelet Count: 213 10*3/uL (ref 145–400)
RBC: 4.45 MIL/uL (ref 3.70–5.45)
RDW: 16.1 % — ABNORMAL HIGH (ref 11.2–14.5)
WBC Count: 6.1 10*3/uL (ref 3.9–10.3)

## 2017-11-27 LAB — CMP (CANCER CENTER ONLY)
ALK PHOS: 117 U/L (ref 38–126)
ALT: 9 U/L (ref 0–44)
ANION GAP: 7 (ref 5–15)
AST: 16 U/L (ref 15–41)
Albumin: 3.2 g/dL — ABNORMAL LOW (ref 3.5–5.0)
BILIRUBIN TOTAL: 0.5 mg/dL (ref 0.3–1.2)
BUN: 12 mg/dL (ref 8–23)
CALCIUM: 9.7 mg/dL (ref 8.9–10.3)
CO2: 34 mmol/L — AB (ref 22–32)
Chloride: 102 mmol/L (ref 98–111)
Creatinine: 0.73 mg/dL (ref 0.44–1.00)
GFR, Estimated: >60 mL/min (ref >60)
Glucose, Bld: 79 mg/dL (ref 70–99)
Potassium: 3.8 mmol/L (ref 3.5–5.1)
SODIUM: 143 mmol/L (ref 135–145)
TOTAL PROTEIN: 7.5 g/dL (ref 6.5–8.1)

## 2017-11-27 LAB — TSH: TSH: 52.573 u[IU]/mL — ABNORMAL HIGH (ref 0.308–3.960)

## 2017-11-27 MED ORDER — SODIUM CHLORIDE 0.9 % IV SOLN
Freq: Once | INTRAVENOUS | Status: AC
  Administered 2017-11-27: 12:00:00 via INTRAVENOUS
  Filled 2017-11-27: qty 250

## 2017-11-27 MED ORDER — SODIUM CHLORIDE 0.9 % IV SOLN
200.0000 mg | Freq: Once | INTRAVENOUS | Status: AC
  Administered 2017-11-27: 200 mg via INTRAVENOUS
  Filled 2017-11-27: qty 8

## 2017-11-27 MED ORDER — LEVOTHYROXINE SODIUM 100 MCG PO TABS: 100.0000 ug | ORAL_TABLET | Freq: Every day | ORAL | 1 refills | Status: DC

## 2017-11-27 NOTE — Patient Instructions (Signed)
[facility] Discharge Instructions for Patients Receiving Chemotherapy  Today you received the following chemotherapy agents:  Keytruda.  To help prevent nausea and vomiting after your treatment, we encourage you to take your nausea medication as directed.   If you develop nausea and vomiting that is not controlled by your nausea medication, call the clinic.   BELOW ARE SYMPTOMS THAT SHOULD BE REPORTED IMMEDIATELY:  *FEVER GREATER THAN 100.5 F  *CHILLS WITH OR WITHOUT FEVER  NAUSEA AND VOMITING THAT IS NOT CONTROLLED WITH YOUR NAUSEA MEDICATION  *UNUSUAL SHORTNESS OF BREATH  *UNUSUAL BRUISING OR BLEEDING  TENDERNESS IN MOUTH AND THROAT WITH OR WITHOUT PRESENCE OF ULCERS  *URINARY PROBLEMS  *BOWEL PROBLEMS  UNUSUAL RASH Items with * indicate a potential emergency and should be followed up as soon as possible.  Feel free to call the clinic should you have any questions or concerns. The clinic phone number is [phone].  Please show the CHEMO ALERT CARD at check-in to the Emergency Department and triage nurse.    

## 2017-11-27 NOTE — Progress Notes (Signed)
[place] [phone]   [phone]) [phone]  OFFICE PROGRESS NOTE  [name], MD [address] [place] [address]  DIAGNOSIS: Metastatic non-small cell carcinoma (T3, N2, M1a) non-small cell lung cancer diagnosed in November 2018 and presented with large right suprahilar and mediastinal mass as well as recent right pleural effusion.  Guardant 360: No actionable mutations.  PRIOR THERAPY:  1) Palliative radiotherapy to the large mediastinal mass under the care of Dr. Lisbeth Renshaw. 2) Systemic chemotherapy with carboplatin for AUC of 5, paclitaxel 175 mg/M2 and Keytruda 200 mg IV every 3 weeks.  First dose expected May 01, 2017.  Status post 3 cycles.  CURRENT THERAPY: Maintenance treatment with immunotherapy with single agent Keytruda 200 mg IV every 3 weeks.  First cycle 07/24/2017.  Status post 6 cycles.  INTERVAL HISTORY: Cassidy Sanders 77 y.o. female returns to the clinic today for follow-up visit accompanied by her daughter.  The patient is feeling fine today with no concerning complaints except for the baseline shortness of breath and she is currently on home oxygen.  She denied having any chest pain, cough or hemoptysis.  She denied having any nausea, vomiting, diarrhea or constipation.  She has no recent weight loss or night sweats.  She has no headache or visual changes.  She continues to tolerate her treatment with Keytruda fairly well.  The patient had repeat CT scan of the chest, abdomen and pelvis performed recently and she is here for evaluation and discussion of her risk her results.  MEDICAL HISTORY: Past Medical History:  Diagnosis Date  . Arrhythmia   . Atrial fibrillation ([place])   . Brain tumor (benign) ([place])   . Depression   . Dyspnea    home oxygen  . Dysrhythmia    a fib  . History of home oxygen therapy 02/2017   2 L/minute  . Hypertension     ALLERGIES:  is allergic to latex; gabapentin; and tape.  MEDICATIONS:    Current Outpatient Medications  Medication Sig Dispense Refill  . albuterol (PROVENTIL) (2.5 MG/3ML) 0.083% nebulizer solution VVN Q 4 H PRF WHEEZING OR SHORTNESS OF BREATH  2  . Biotin 2.5 MG TABS Take 2,500 mcg by mouth daily.    . clobetasol cream (TEMOVATE) 7.16 % Apply 1 application topically 2 (two) times daily. 60 g 0  . diphenhydrAMINE (BENADRYL) 25 mg capsule Take 1 capsule (25 mg total) by mouth at bedtime as needed for sleep. (Patient not taking: Reported on 11/06/2017) 30 capsule 0  . feeding supplement, ENSURE ENLIVE, (ENSURE ENLIVE) LIQD Take 237 mLs by mouth 2 (two) times daily as needed (If patient or family requests, if pt unable to eat for a meal, or if intakes are poor). 237 mL 12  . hydrOXYzine (ATARAX/VISTARIL) 10 MG tablet Take 1 tablet (10 mg total) by mouth 3 (three) times daily as needed for itching. 30 tablet 0  . levalbuterol (XOPENEX) 0.63 MG/3ML nebulizer solution Take 3 mLs (0.63 mg total) by nebulization every 6 (six) hours as needed for wheezing or shortness of breath. 3 mL 12  . levothyroxine (SYNTHROID, LEVOTHROID) 75 MCG tablet TAKE 1 TABLET(75 MCG) BY MOUTH DAILY BEFORE BREAKFAST 90 tablet 0  . loperamide (IMODIUM) 2 MG capsule Take 2 mg by mouth as needed for diarrhea or loose stools.    . metoprolol tartrate (LOPRESSOR) 50 MG tablet Take 1 tablet (50 mg total) by mouth 2 (two) times daily. (Patient taking differently: Take 25 mg  by mouth daily. ) 60 tablet 0  . oxyCODONE (OXY IR/ROXICODONE) 5 MG immediate release tablet Take 1 tablet by mouth every 2 (two) hours as needed.   0  . Respiratory Therapy Supplies (FLUTTER) DEVI Use as directed. 1 each 0  . sertraline (ZOLOFT) 50 MG tablet Take 50 mg by mouth daily.     No current facility-administered medications for this visit.     SURGICAL HISTORY:  Past Surgical History:  Procedure Laterality Date  . ABDOMINAL HYSTERECTOMY    . BLADDER SURGERY    . BRAIN SURGERY    . ENDOBRONCHIAL ULTRASOUND Bilateral  02/06/2017   Procedure: ENDOBRONCHIAL ULTRASOUND;  Surgeon: Juanito Doom, MD;  Location: [facility] ENDOSCOPY;  Service: Cardiopulmonary;  Laterality: Bilateral;  . IR GUIDED DRAIN W CATHETER PLACEMENT  06/29/2017  . IR REMOVAL OF PLURAL CATH W/CUFF  08/28/2017  . IR US GUIDE BX ASP/DRAIN  06/29/2017  . VIDEO BRONCHOSCOPY WITH ENDOBRONCHIAL ULTRASOUND N/A 03/23/2017   Procedure: VIDEO BRONCHOSCOPY WITH ENDOBRONCHIAL ULTRASOUND;  Surgeon: Juanito Doom, MD;  Location: [facility];  Service: Thoracic;  Laterality: N/A;    REVIEW OF SYSTEMS:  Constitutional: negative Eyes: negative Ears, nose, mouth, throat, and face: negative Respiratory: positive for dyspnea on exertion Cardiovascular: negative Gastrointestinal: negative Genitourinary:negative Integument/breast: negative Hematologic/lymphatic: negative Musculoskeletal:negative Neurological: negative Behavioral/Psych: negative Endocrine: negative Allergic/Immunologic: negative   PHYSICAL EXAMINATION: General appearance: alert, cooperative, fatigued and no distress Head: Normocephalic, without obvious abnormality, atraumatic Neck: no adenopathy, no JVD, supple, symmetrical, trachea midline and thyroid not enlarged, symmetric, no tenderness/mass/nodules Lymph nodes: Cervical, supraclavicular, and axillary nodes normal. Resp: clear to auscultation bilaterally Back: symmetric, no curvature. ROM normal. No CVA tenderness. Cardio: regular rate and rhythm, S1, S2 normal, no murmur, click, rub or gallop GI: soft, non-tender; bowel sounds normal; no masses,  no organomegaly Extremities: extremities normal, atraumatic, no cyanosis or edema Neurologic: Alert and oriented X 3, normal strength and tone. Normal symmetric reflexes. Normal coordination and gait  ECOG PERFORMANCE STATUS: 1 - Symptomatic but completely ambulatory  Blood pressure 137/79, pulse 92, temperature 98 F (36.7 C), temperature source Oral, resp. rate 18, height 5' 7.5" (1.715 m),  weight 166 lb 6.4 oz (75.5 kg), SpO2 93 %.  LABORATORY DATA: Lab Results  Component Value Date   WBC 6.1 11/27/2017   HGB 11.9 11/27/2017   HCT 38.8 11/27/2017   MCV 87.2 11/27/2017   PLT 213 11/27/2017      Chemistry      Component Value Date/Time   NA 143 11/27/2017 1019   NA 143 03/15/2017 1436   K 3.8 11/27/2017 1019   K 3.7 03/15/2017 1436   CL 102 11/27/2017 1019   CO2 34 (H) 11/27/2017 1019   CO2 34 (H) 03/15/2017 1436   BUN 12 11/27/2017 1019   BUN 17.8 03/15/2017 1436   CREATININE 0.73 11/27/2017 1019   CREATININE 0.8 03/15/2017 1436      Component Value Date/Time   CALCIUM 9.7 11/27/2017 1019   CALCIUM 9.6 03/15/2017 1436   ALKPHOS 117 11/27/2017 1019   ALKPHOS 88 03/15/2017 1436   AST 16 11/27/2017 1019   AST 16 03/15/2017 1436   ALT 9 11/27/2017 1019   ALT 15 03/15/2017 1436   BILITOT 0.5 11/27/2017 1019   BILITOT 0.52 03/15/2017 1436       RADIOGRAPHIC STUDIES: Dg Chest 2 View  Result Date: 11/02/2017 CLINICAL DATA:  Follow-up right-sided pleural effusion. EXAM: CHEST - 2 VIEW COMPARISON:  10/23/2017. FINDINGS: Heart size normal.  Decreased size in right hilar soft tissue prominence. Persistent right lung infiltrate. Persistent right pleural effusion and pleural thickening. No pneumothorax. No acute bony abnormality. IMPRESSION: 1. Decreased size in right hilar soft tissue prominence. Interim improvement right upper lobe atelectasis. 2.  Persistent right-sided pleural thickening. Electronically Signed   By: Marcello Moores  Register   On: 11/02/2017 11:42   Ct Chest Wo Contrast  Result Date: 11/23/2017 CLINICAL DATA:  77 year old female with history of non-small cell lung cancer diagnosed in November 2018. Status post palliative radiotherapy. Undergoing systemic chemotherapy. Follow-up study. EXAM: CT CHEST WITHOUT CONTRAST TECHNIQUE: Multidetector CT imaging of the chest was performed following the standard protocol without IV contrast. COMPARISON:  Chest CT  09/21/2017. FINDINGS: Cardiovascular: Heart size is normal. There is no significant pericardial fluid, thickening or pericardial calcification. There is aortic atherosclerosis, as well as atherosclerosis of the great vessels of the mediastinum and the coronary arteries, including calcified atherosclerotic plaque in the left main, left anterior descending, left circumflex and right coronary arteries. Calcifications of the aortic valve. Mediastinum/Nodes: No pathologically enlarged mediastinal or hilar lymph nodes. Please note that accurate exclusion of hilar adenopathy is limited on noncontrast CT scans. Esophagus is unremarkable in appearance. No axillary lymphadenopathy Lungs/Pleura: Large chronic partially loculated right pleural effusion with extensive passive atelectasis and postradiation fibrosis in the right lung. Minimal aerated right upper lobe is noted near the apex. Some chronic postradiation changes are also noted in the medial aspect of the left upper lobe. No definite suspicious appearing pulmonary nodules or masses are noted on today's examination. No acute consolidative airspace disease in the left lung. Previously noted left pleural effusion has nearly completely resolved, now with only a trace amount of residual pleural fluid medially. Upper Abdomen: Aortic atherosclerosis. Musculoskeletal: Chronic compression fractures of T3 and T12 are unchanged, most severe T12 where there is 30% loss of anterior vertebral body height. There are no aggressive appearing lytic or blastic lesions noted in the visualized portions of the skeleton. IMPRESSION: 1. Large chronic partially loculated right-sided pleural effusion with extensive passive atelectasis and postradiation changes in the right lung, similar to the prior study. No findings on today's examination to strongly suggest residual/recurrent disease. 2. Decreasing trace left pleural effusion. 3. Aortic atherosclerosis, in addition to left main and 3 vessel  coronary artery disease. Assessment for potential risk factor modification, dietary therapy or pharmacologic therapy may be warranted, if clinically indicated. 4. There are calcifications of the aortic valve. Echocardiographic correlation for evaluation of potential valvular dysfunction may be warranted if clinically indicated. Aortic Atherosclerosis (ICD10-I70.0). Electronically Signed   By: Vinnie Langton M.D.   On: 11/23/2017 11:13    ASSESSMENT AND PLAN: This is a very pleasant 77 years old white female recently diagnosed with probably stage IV non-small cell lung cancer presented with large right suprahilar lung mass in addition to mediastinal lymphadenopathy and suspicious malignant right pleural effusion.  The patient underwent systemic chemotherapy with carboplatin, paclitaxel and Keytruda status post 3 cycles.  She was tolerating this treatment well with no concerning complaints except for the last cycle when she was admitted to the hospital with C. difficile as well as dehydration and renal insufficiency.   She is currently on treatment with single agent Ketruda (pembrolizumab) status post 6 cycles.  She has been tolerating this treatment well with no concerning adverse effects. The patient had repeat CT scan of the chest, abdomen and pelvis performed recently.  I personally and independently reviewed the scans and discussed the results  with the patient and her daughter. Her scan showed no concerning findings for disease progression and there was decrease in the left pleural effusion. I recommended for the patient to continue her current treatment with [facility] and she will proceed with cycle #7 today. I will see her back for follow-up visit in 3 weeks for evaluation before the next cycle of her treatment. She was advised to call immediately if she has any concerning symptoms in the interval. The patient voices understanding of current disease status and treatment options and is in agreement  with the current care plan. All questions were answered. The patient knows to call the clinic with any problems, questions or concerns. We can certainly see the patient much sooner if necessary. I spent 15 minutes counseling the patient face to face. The total time spent in the appointment was 25 minutes.  Disclaimer: This note was dictated with voice recognition software. Similar sounding words can inadvertently be transcribed and may not be corrected upon review.

## 2017-11-27 NOTE — Telephone Encounter (Signed)
Printed avs and calender of upcoming appointment. per 9/16

## 2017-11-27 NOTE — Progress Notes (Signed)
Oncology Nurse Navigator Documentation  Oncology Nurse Navigator Flowsheets 11/27/2017  Navigator Location [facility]  Navigator Encounter Type Clinic/MDC/I spoke with patient and daughter today.  She is doing well without complaints.  No barriers identified at this time.   Patient Visit Type MedOnc  Treatment Phase Treatment  Barriers/Navigation Needs No barriers at this time  Acuity Level 1  Time Spent with Patient 15

## 2017-11-29 NOTE — Progress Notes (Signed)
Cardiology Office Note   Date:  12/07/2017   ID:  Cassidy Sanders, DOB 21-May-1940, MRN [id number]  PCP:  [name], MD  Cardiologist:  Dr. Johnsie Cancel     No chief complaint on file.     History of Present Illness: Cassidy Sanders is a 77 y.o. female who presents for post hospitalization for a fib after admit for diarrhea -C. Diff, and  hx of metastatic non small cell carcinoma, lung cancer.  She had hx of malignant Rt effusion.  And had pleur X catheter.  She went into a fib 06/30/17 -no chest pain.  Her CHA2DS2VASc score was 4-5.   She was started on IV dilt and amiodarone.   She did convert to SR.     She was discharged on BB and Eliquis.  She will need to be monitored for bleeding.  2D echo with normal LVF EF 55-60% with G2DD, moderate PHTN and no pericardial effusion  D/c'd 07/06/17   Has had issues with recurrent malignant right pleural effusion with Pleur X catheter Rx Also some rectal bleeding   Doing well with no cardiac complaints Sees Dr Maryellen Pile for oncology and Pleur X out Her daughter with her today and goes to [facility]    Past Medical History:  Diagnosis Date  . Arrhythmia   . Atrial fibrillation ([place])   . Brain tumor (benign) ([place])   . Depression   . Dyspnea    home oxygen  . Dysrhythmia    a fib  . History of home oxygen therapy 02/2017   2 L/minute  . Hypertension     Past Surgical History:  Procedure Laterality Date  . ABDOMINAL HYSTERECTOMY    . BLADDER SURGERY    . BRAIN SURGERY    . ENDOBRONCHIAL ULTRASOUND Bilateral 02/06/2017   Procedure: ENDOBRONCHIAL ULTRASOUND;  Surgeon: Juanito Doom, MD;  Location: [facility];  Service: Cardiopulmonary;  Laterality: Bilateral;  . IR GUIDED DRAIN W CATHETER PLACEMENT  06/29/2017  . IR REMOVAL OF PLURAL CATH W/CUFF  08/28/2017  . IR US GUIDE BX ASP/DRAIN  06/29/2017  . VIDEO BRONCHOSCOPY WITH ENDOBRONCHIAL ULTRASOUND N/A 03/23/2017   Procedure: VIDEO BRONCHOSCOPY WITH ENDOBRONCHIAL  ULTRASOUND;  Surgeon: Juanito Doom, MD;  Location: [facility] OR;  Service: Thoracic;  Laterality: N/A;     Current Outpatient Medications  Medication Sig Dispense Refill  . albuterol (PROVENTIL) (2.5 MG/3ML) 0.083% nebulizer solution VVN Q 4 H PRF WHEEZING OR SHORTNESS OF BREATH  2  . Biotin 2.5 MG TABS Take 2,500 mcg by mouth daily.    . clobetasol cream (TEMOVATE) 0.25 % Apply 1 application topically 2 (two) times daily. 60 g 0  . diphenhydrAMINE (BENADRYL) 25 mg capsule Take 1 capsule (25 mg total) by mouth at bedtime as needed for sleep. 30 capsule 0  . feeding supplement, ENSURE ENLIVE, (ENSURE ENLIVE) LIQD Take 237 mLs by mouth 2 (two) times daily as needed (If patient or family requests, if pt unable to eat for a meal, or if intakes are poor). 237 mL 12  . hydrOXYzine (ATARAX/VISTARIL) 10 MG tablet Take 1 tablet (10 mg total) by mouth 3 (three) times daily as needed for itching. 30 tablet 0  . levalbuterol (XOPENEX) 0.63 MG/3ML nebulizer solution Take 3 mLs (0.63 mg total) by nebulization every 6 (six) hours as needed for wheezing or shortness of breath. 3 mL 12  . levothyroxine (SYNTHROID, LEVOTHROID) 100 MCG tablet TAKE 1 TABLET BY MOUTH EVERY DAY BEFORE BREAKFAST 90 tablet 1  .  loperamide (IMODIUM) 2 MG capsule Take 2 mg by mouth as needed for diarrhea or loose stools.    . metoprolol tartrate (LOPRESSOR) 25 MG tablet Take 25 mg by mouth daily.     Marland Kitchen oxyCODONE (OXY IR/ROXICODONE) 5 MG immediate release tablet Take 1 tablet by mouth every 2 (two) hours as needed.   0  . Respiratory Therapy Supplies (FLUTTER) DEVI Use as directed. 1 each 0  . sertraline (ZOLOFT) 50 MG tablet Take 50 mg by mouth daily.     No current facility-administered medications for this visit.     Allergies:   Latex; Gabapentin; and Tape    Social History:  The patient  reports that she quit smoking about 5 years ago. Her smoking use included cigarettes. She has a 59.00 pack-year smoking history. She has never  used smokeless tobacco. She reports that she drinks alcohol. She reports that she does not use drugs.   Family History:  The patient's Family history is unknown by patient.    ROS:  General:no colds or fevers, no weight changes Skin:no rashes or ulcers HEENT:no blurred vision, no congestion CV:see HPI PUL:see HPI GI:no diarrhea constipation or melena, no indigestion GU:no hematuria, no dysuria MS:no joint pain, no claudication Neuro:no syncope, no lightheadedness Endo:no diabetes, no thyroid disease  Wt Readings from Last 3 Encounters:  12/07/17 165 lb 8 oz (75.1 kg)  11/30/17 167 lb (75.8 kg)  11/27/17 166 lb 6.4 oz (75.5 kg)     PHYSICAL EXAM: VS:  BP 128/72   Pulse 69   Ht 5' 7.5" (1.715 m)   Wt 165 lb 8 oz (75.1 kg)   SpO2 95%   BMI 25.54 kg/m  , BMI Body mass index is 25.54 kg/m. Affect appropriate Chronically ill female  HEENT: normal Neck supple with no adenopathy JVP normal no bruits no thyromegaly Lungs clear no evidence of recurrent effusion  Heart:  S1/S2 no murmur, no rub, gallop or click PMI normal Abdomen: benighn, BS positve, no tenderness, no AAA no bruit.  No HSM or HJR Distal pulses intact with no bruits No edema Neuro non-focal Skin warm and dry No muscular weakness    EKG:  07/25/17  SR Q waves in III no changes from last EKG at hospital.     Recent Labs: 07/04/2017: Magnesium 1.8 11/27/2017: ALT 9; BUN 12; Creatinine 0.73; Hemoglobin 11.9; Platelet Count 213; Potassium 3.8; Sodium 143; TSH 52.573    Lipid Panel    Component Value Date/Time   TRIG 111 02/06/2017 0910       Other studies Reviewed: Additional studies/ records that were reviewed today include: . Echo 07/01/17  Study Conclusions  - Left ventricle: The cavity size was normal. Wall thickness was   increased in a pattern of moderate LVH. Systolic function was   normal. The estimated ejection fraction was in the range of 55%   to 60%. Features are consistent with a  pseudonormal left   ventricular filling pattern, with concomitant abnormal relaxation   and increased filling pressure (grade 2 diastolic dysfunction).   Doppler parameters are consistent with high ventricular filling   pressure. - Aortic valve: Mildly to moderately calcified annulus. Trileaflet;   moderately thickened leaflets. Valve area (VTI): 2.29 cm^2. Valve   area (Vmax): 2.01 cm^2. Valve area (Vmean): 2.06 cm^2. - Mitral valve: Mildly calcified annulus. Mildly thickened leaflets   . - Left atrium: The atrium was severely dilated. - Right ventricle: The cavity size was mildly dilated. Wall  thickness was normal. - Right atrium: The atrium was mildly dilated. - Pulmonary arteries: Systolic pressure was moderately increased.   PA peak pressure: 45 mm Hg (S). - Technically difficult study.  ASSESSMENT AND PLAN:  1.  PAF, maintaining SR - she has no symptoms of a fib.     2.  Anticoagulation: held for rectal bleeding and Pleur X catheter removal   3.  Rt pl effusion worse by CT scan 11/23/17 with loculations and passive atelectasis plan per oncology   4.  HTN now on low end, off meds except lopressor.    5.  Metastatic non small cell carcinoma followed by oncology. On Ketruda    Jenkins Rouge

## 2017-11-30 ENCOUNTER — Encounter: Payer: Self-pay | Admitting: Pulmonary Disease

## 2017-11-30 ENCOUNTER — Ambulatory Visit: Payer: Medicare Other | Admitting: Pulmonary Disease

## 2017-11-30 ENCOUNTER — Ambulatory Visit (INDEPENDENT_AMBULATORY_CARE_PROVIDER_SITE_OTHER): Payer: Medicare Other | Admitting: Pulmonary Disease

## 2017-11-30 VITALS — BP 140/90 | HR 86 | Ht 67.5 in | Wt 167.0 lb

## 2017-11-30 DIAGNOSIS — C3491 Malignant neoplasm of unspecified part of right bronchus or lung: Secondary | ICD-10-CM

## 2017-11-30 DIAGNOSIS — J9611 Chronic respiratory failure with hypoxia: Secondary | ICD-10-CM

## 2017-11-30 DIAGNOSIS — J90 Pleural effusion, not elsewhere classified: Secondary | ICD-10-CM | POA: Diagnosis not present

## 2017-11-30 NOTE — Patient Instructions (Signed)
Large loculated right-sided pleural effusion: I do not think that we would help you by trying to remove this. If you feel worsening shortness of breath please let us know and we will check another chest x-ray  Non-small cell lung cancer: Continue taking the medication as directed by Dr. Earlie Server Continue follow-up with Dr. Earlie Server  Chronic respiratory failure with hypoxemia: This is due to the right lung being compressed from the large fluid collection Continue using oxygen as you are doing  Get a high-dose flu shot Practice good hand hygiene  Follow-up with me in January or sooner if needed

## 2017-11-30 NOTE — Progress Notes (Signed)
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 Follow-up    Pt states she has been well since last visit and denies any complaints.   Cassidy Sanders says she is doing "great".  She denies any problems breathing.  She says she does not have problems with chest congestion or mucus production.  She continues to use and benefit from her oxygen.  She continues to take [facility] as prescribed by Dr. Earlie Server.  Past Medical History:  Diagnosis Date  . Arrhythmia   . Atrial fibrillation ([place])   . Brain tumor (benign) ([place])   . Depression   . Dyspnea    home oxygen  . Dysrhythmia    a fib  . History of home oxygen therapy 02/2017   2 L/minute  . Hypertension      Review of Systems  Constitutional: Positive for malaise/fatigue.  HENT: Negative for congestion and ear discharge.   Respiratory: Positive for shortness of breath. Negative for cough, sputum production, wheezing and stridor.   Cardiovascular: Negative for claudication and leg swelling.  Neurological: Positive for weakness.      Objective:  Physical Exam   Vitals:   11/30/17 0952  BP: 140/90  Pulse: 86  SpO2: 94%  Weight: 167 lb (75.8 kg)  Height: 5' 7.5" (1.715 m)   2L [place]  Gen: chronically ill appearing HENT: OP clear, TM's clear, neck supple PULM: Diminished R lung, some wheezing on left, normal percussion CV: RRR, no mgr, trace edema GI: BS+, soft, nontender Derm: no cyanosis or rash Psyche: normal mood and affect    CBC    Component Value Date/Time   WBC 6.1 11/27/2017 1019   WBC 7.1 08/28/2017 1239   RBC 4.45 11/27/2017 1019   HGB 11.9 11/27/2017 1019   HGB 11.8 03/15/2017 1436   HCT 38.8 11/27/2017 1019   HCT 37.8 03/15/2017 1436   PLT 213 11/27/2017 1019   PLT 215 03/15/2017 1436   MCV 87.2 11/27/2017 1019   MCV 92.4 03/15/2017 1436   MCH 26.7 11/27/2017 1019   MCHC 30.7 (L) 11/27/2017 1019   RDW 16.1 (H) 11/27/2017 1019   RDW 15.3 (H) 03/15/2017 1436   LYMPHSABS 1.0 11/27/2017 1019   LYMPHSABS 1.8 03/15/2017 1436   MONOABS 0.6 11/27/2017 1019   MONOABS 0.7 03/15/2017 1436   EOSABS 0.1 11/27/2017 1019   EOSABS 0.1 03/15/2017 1436   BASOSABS 0.0 11/27/2017 1019   BASOSABS 0.0 03/15/2017 1436     Chest imaging: Multiple chest x-ray images reviewed.  Specifically the one from today shows that there is improved aeration in the right upper lobe near the mass but I worry that there is an increased pleural effusion.  Radiology says that the pleural effusion is stable as is the pleural thickening. 11/2017 CT chest images reviewed personally: there is a large loculated effusion on the right which has not changed in appearance compared to the 09/2017 CT chest, mild lingular radiation fibrosis, otherwise normal pulmonary parenchyma on left  PFT:  Labs:  Path:  Echo:  Heart Catheterization:  Notes from her visit with Dr. Julien Nordmann reviewed, she is undergoing treatment of metastatic non-small cell lung cancer with XRT and chemo.  She saw a PA about a week ago for shortness of breath and was prescribed prednisone and albuterol.  Assessment & Plan:   Pleural effusion on right  Stage IV squamous cell carcinoma of right lung (HCC)  Chronic respiratory failure with hypoxia (HCC)  Discussion: Ever the optimist, Cassidy Sanders says that she has no problems breathing right now.  Is amazing to me that she feels this well because she has quite a large burden of fluid in the right lung.  I carefully reviewed the images from 2019 and it appears to me that the large loculated pleural effusion has not changed since the Pleurx catheter was removed.  At this point I think it would be very difficult to try to remove the pleural fluid without causing a significant amount of harm.  Plan: Large loculated right-sided  pleural effusion: I do not think that we would help you by trying to remove this. If you feel worsening shortness of breath please let us know and we will check another chest x-ray  Non-small cell lung cancer: Continue taking the medication as directed by Dr. Earlie Server Continue follow-up with Dr. Earlie Server  Chronic respiratory failure with hypoxemia: This is due to the right lung being compressed from the large fluid collection Continue using oxygen as you are doing  Get a high-dose flu shot Practice good hand hygiene  Follow-up with me in January or sooner if needed    Current Outpatient Medications:  .  albuterol (PROVENTIL) (2.5 MG/3ML) 0.083% nebulizer solution, VVN Q 4 H PRF WHEEZING OR SHORTNESS OF BREATH, Disp: , Rfl: 2 .  Biotin 2.5 MG TABS, Take 2,500 mcg by mouth daily., Disp: , Rfl:  .  clobetasol cream (TEMOVATE) 8.25 %, Apply 1 application topically 2 (two) times daily., Disp: 60 g, Rfl: 0 .  diphenhydrAMINE (BENADRYL) 25 mg capsule, Take 1 capsule (25 mg total) by mouth at bedtime as needed for sleep., Disp: 30 capsule, Rfl: 0 .  feeding supplement, ENSURE ENLIVE, (ENSURE ENLIVE) LIQD, Take 237 mLs by mouth 2 (two) times daily as needed (If patient or family requests, if pt unable to eat for a meal, or if intakes are poor)., Disp: 237 mL, Rfl: 12 .  hydrOXYzine (ATARAX/VISTARIL) 10 MG tablet, Take 1 tablet (10 mg total) by mouth 3 (three) times daily as needed for itching., Disp: 30 tablet, Rfl: 0 .  levalbuterol (XOPENEX) 0.63 MG/3ML nebulizer solution, Take 3 mLs (0.63 mg total) by nebulization every 6 (six) hours as needed for wheezing or shortness of breath., Disp: 3 mL, Rfl: 12 .  levothyroxine (SYNTHROID, LEVOTHROID) 100 MCG tablet, TAKE 1 TABLET BY MOUTH EVERY DAY BEFORE BREAKFAST, Disp: 90 tablet, Rfl: 1 .  loperamide (IMODIUM) 2 MG capsule, Take 2 mg by mouth as needed for diarrhea or loose stools., Disp: , Rfl:  .  metoprolol tartrate (LOPRESSOR) 25 MG tablet, Take  25 mg by mouth daily. , Disp: , Rfl:  .  oxyCODONE (OXY IR/ROXICODONE) 5 MG immediate release tablet, Take 1 tablet by mouth every 2 (two) hours as needed. , Disp: , Rfl: 0 .  Respiratory Therapy Supplies (FLUTTER) DEVI, Use as directed., Disp: 1 each, Rfl: 0 .  sertraline (ZOLOFT) 50 MG tablet, Take 50 mg by mouth daily., Disp: , Rfl:

## 2017-12-07 ENCOUNTER — Encounter: Payer: Self-pay | Admitting: Cardiovascular Disease

## 2017-12-07 ENCOUNTER — Ambulatory Visit (INDEPENDENT_AMBULATORY_CARE_PROVIDER_SITE_OTHER): Payer: Medicare Other | Admitting: Cardiovascular Disease

## 2017-12-07 VITALS — BP 128/72 | HR 69 | Ht 67.5 in | Wt 165.5 lb

## 2017-12-07 DIAGNOSIS — I480 Paroxysmal atrial fibrillation: Secondary | ICD-10-CM

## 2017-12-07 DIAGNOSIS — I10 Essential (primary) hypertension: Secondary | ICD-10-CM | POA: Diagnosis not present

## 2017-12-07 NOTE — Patient Instructions (Signed)

## 2017-12-12 DIAGNOSIS — I119 Hypertensive heart disease without heart failure: Secondary | ICD-10-CM | POA: Diagnosis not present

## 2017-12-12 DIAGNOSIS — C3491 Malignant neoplasm of unspecified part of right bronchus or lung: Secondary | ICD-10-CM | POA: Diagnosis not present

## 2017-12-18 ENCOUNTER — Inpatient Hospital Stay: Payer: Medicare Other | Attending: Internal Medicine

## 2017-12-18 ENCOUNTER — Inpatient Hospital Stay (HOSPITAL_BASED_OUTPATIENT_CLINIC_OR_DEPARTMENT_OTHER): Payer: Medicare Other | Admitting: Internal Medicine

## 2017-12-18 ENCOUNTER — Inpatient Hospital Stay: Payer: Medicare Other

## 2017-12-18 ENCOUNTER — Encounter: Payer: Self-pay | Admitting: Internal Medicine

## 2017-12-18 VITALS — BP 123/75 | HR 95 | Temp 98.0°F | Resp 18 | Ht 67.5 in | Wt 167.4 lb

## 2017-12-18 DIAGNOSIS — Z5112 Encounter for antineoplastic immunotherapy: Secondary | ICD-10-CM

## 2017-12-18 DIAGNOSIS — Z79899 Other long term (current) drug therapy: Secondary | ICD-10-CM | POA: Insufficient documentation

## 2017-12-18 DIAGNOSIS — C382 Malignant neoplasm of posterior mediastinum: Secondary | ICD-10-CM

## 2017-12-18 DIAGNOSIS — C3491 Malignant neoplasm of unspecified part of right bronchus or lung: Secondary | ICD-10-CM | POA: Insufficient documentation

## 2017-12-18 DIAGNOSIS — I10 Essential (primary) hypertension: Secondary | ICD-10-CM

## 2017-12-18 LAB — CMP (CANCER CENTER ONLY)
ALK PHOS: 110 U/L (ref 38–126)
ALT: 15 U/L (ref 0–44)
AST: 19 U/L (ref 15–41)
Albumin: 3.4 g/dL — ABNORMAL LOW (ref 3.5–5.0)
Anion gap: 8 (ref 5–15)
BUN: 16 mg/dL (ref 8–23)
CALCIUM: 9.7 mg/dL (ref 8.9–10.3)
CO2: 31 mmol/L (ref 22–32)
Chloride: 105 mmol/L (ref 98–111)
Creatinine: 0.79 mg/dL (ref 0.44–1.00)
GFR, Estimated: >60 mL/min (ref >60)
Glucose, Bld: 89 mg/dL (ref 70–99)
POTASSIUM: 3.7 mmol/L (ref 3.5–5.1)
SODIUM: 144 mmol/L (ref 135–145)
TOTAL PROTEIN: 7.8 g/dL (ref 6.5–8.1)
Total Bilirubin: 0.4 mg/dL (ref 0.3–1.2)

## 2017-12-18 LAB — CBC WITH DIFFERENTIAL (CANCER CENTER ONLY)
Abs Immature Granulocytes: 0.02 10*3/uL (ref 0.00–0.07)
BASOS PCT: 1 %
Basophils Absolute: 0 10*3/uL (ref 0.0–0.1)
EOS ABS: 0.1 10*3/uL (ref 0.0–0.5)
Eosinophils Relative: 2 %
HCT: 40.3 % (ref 36.0–46.0)
Hemoglobin: 12.4 g/dL (ref 12.0–15.0)
IMMATURE GRANULOCYTES: 0 %
LYMPHS ABS: 0.9 10*3/uL (ref 0.7–4.0)
Lymphocytes Relative: 15 %
MCH: 27 pg (ref 26.0–34.0)
MCHC: 30.8 g/dL (ref 30.0–36.0)
MCV: 87.6 fL (ref 80.0–100.0)
Monocytes Absolute: 0.5 10*3/uL (ref 0.1–1.0)
Monocytes Relative: 9 %
NEUTROS PCT: 73 %
Neutro Abs: 4.4 10*3/uL (ref 1.7–7.7)
PLATELETS: 229 10*3/uL (ref 150–400)
RBC: 4.6 MIL/uL (ref 3.87–5.11)
RDW: 17 % — AB (ref 11.5–15.5)
WBC Count: 6 10*3/uL (ref 4.0–10.5)
nRBC: 0 % (ref 0.0–0.2)

## 2017-12-18 MED ORDER — SODIUM CHLORIDE 0.9 % IV SOLN
Freq: Once | INTRAVENOUS | Status: AC
  Administered 2017-12-18: 15:00:00 via INTRAVENOUS
  Filled 2017-12-18: qty 250

## 2017-12-18 MED ORDER — SODIUM CHLORIDE 0.9 % IV SOLN
200.0000 mg | Freq: Once | INTRAVENOUS | Status: AC
  Administered 2017-12-18: 200 mg via INTRAVENOUS
  Filled 2017-12-18: qty 8

## 2017-12-18 NOTE — Progress Notes (Signed)
[place] [phone]   [phone]  OFFICE PROGRESS NOTE  [name], MD [address] [place] [address]  DIAGNOSIS: Metastatic non-small cell carcinoma (T3, N2, M1a) non-small cell lung cancer diagnosed in November 2018 and presented with large right suprahilar and mediastinal mass as well as recent right pleural effusion.  Guardant 360: No actionable mutations.  PRIOR THERAPY:  1) Palliative radiotherapy to the large mediastinal mass under the care of Dr. Lisbeth Renshaw. 2) Systemic chemotherapy with carboplatin for AUC of 5, paclitaxel 175 mg/M2 and Keytruda 200 mg IV every 3 weeks.  First dose expected May 01, 2017.  Status post 3 cycles.  CURRENT THERAPY: Maintenance treatment with immunotherapy with single agent Keytruda 200 mg IV every 3 weeks.  First cycle 07/24/2017.  Status post 7 cycles.  INTERVAL HISTORY: Cassidy Sanders 77 y.o. female returns to the clinic today for follow-up visit accompanied by her daughter Cassidy Sanders.  The patient is feeling fine today with no concerning complaints.  She denied having any chest pain, shortness of breath, cough or hemoptysis.  She has no nausea, vomiting, diarrhea or constipation.  She has no weight loss or night sweats.  She continues to tolerate her treatment with Keytruda fairly well.  The patient is here today for evaluation before starting cycle #8.  MEDICAL HISTORY: Past Medical History:  Diagnosis Date  . Arrhythmia   . Atrial fibrillation ([place])   . Brain tumor (benign) ([place])   . Depression   . Dyspnea    home oxygen  . Dysrhythmia    a fib  . History of home oxygen therapy 02/2017   2 L/minute  . Hypertension     ALLERGIES:  is allergic to latex; gabapentin; and tape.  MEDICATIONS:  Current Outpatient Medications  Medication Sig Dispense Refill  . albuterol (PROVENTIL) (2.5 MG/3ML) 0.083% nebulizer solution VVN Q 4 H PRF WHEEZING OR SHORTNESS OF BREATH  2  . Biotin 2.5 MG  TABS Take 2,500 mcg by mouth daily.    . clobetasol cream (TEMOVATE) 4.62 % Apply 1 application topically 2 (two) times daily. 60 g 0  . diphenhydrAMINE (BENADRYL) 25 mg capsule Take 1 capsule (25 mg total) by mouth at bedtime as needed for sleep. 30 capsule 0  . feeding supplement, ENSURE ENLIVE, (ENSURE ENLIVE) LIQD Take 237 mLs by mouth 2 (two) times daily as needed (If patient or family requests, if pt unable to eat for a meal, or if intakes are poor). 237 mL 12  . hydrOXYzine (ATARAX/VISTARIL) 10 MG tablet Take 1 tablet (10 mg total) by mouth 3 (three) times daily as needed for itching. 30 tablet 0  . levalbuterol (XOPENEX) 0.63 MG/3ML nebulizer solution Take 3 mLs (0.63 mg total) by nebulization every 6 (six) hours as needed for wheezing or shortness of breath. 3 mL 12  . levothyroxine (SYNTHROID, LEVOTHROID) 100 MCG tablet TAKE 1 TABLET BY MOUTH EVERY DAY BEFORE BREAKFAST 90 tablet 1  . loperamide (IMODIUM) 2 MG capsule Take 2 mg by mouth as needed for diarrhea or loose stools.    . metoprolol tartrate (LOPRESSOR) 25 MG tablet Take 25 mg by mouth daily.     Marland Kitchen oxyCODONE (OXY IR/ROXICODONE) 5 MG immediate release tablet Take 1 tablet by mouth every 2 (two) hours as needed.   0  . Respiratory Therapy Supplies (FLUTTER) DEVI Use as directed. 1 each 0  . sertraline (ZOLOFT) 50 MG tablet Take 50 mg by mouth daily.  No current facility-administered medications for this visit.     SURGICAL HISTORY:  Past Surgical History:  Procedure Laterality Date  . ABDOMINAL HYSTERECTOMY    . BLADDER SURGERY    . BRAIN SURGERY    . ENDOBRONCHIAL ULTRASOUND Bilateral 02/06/2017   Procedure: ENDOBRONCHIAL ULTRASOUND;  Surgeon: Juanito Doom, MD;  Location: [facility];  Service: Cardiopulmonary;  Laterality: Bilateral;  . IR GUIDED DRAIN W CATHETER PLACEMENT  06/29/2017  . IR REMOVAL OF PLURAL CATH W/CUFF  08/28/2017  . IR US GUIDE BX ASP/DRAIN  06/29/2017  . VIDEO BRONCHOSCOPY WITH ENDOBRONCHIAL  ULTRASOUND N/A 03/23/2017   Procedure: VIDEO BRONCHOSCOPY WITH ENDOBRONCHIAL ULTRASOUND;  Surgeon: Juanito Doom, MD;  Location: [facility] OR;  Service: Thoracic;  Laterality: N/A;    REVIEW OF SYSTEMS:  A comprehensive review of systems was negative except for: Respiratory: positive for dyspnea on exertion   PHYSICAL EXAMINATION: General appearance: alert, cooperative, fatigued and no distress Head: Normocephalic, without obvious abnormality, atraumatic Neck: no adenopathy, no JVD, supple, symmetrical, trachea midline and thyroid not enlarged, symmetric, no tenderness/mass/nodules Lymph nodes: Cervical, supraclavicular, and axillary nodes normal. Resp: clear to auscultation bilaterally Back: symmetric, no curvature. ROM normal. No CVA tenderness. Cardio: regular rate and rhythm, S1, S2 normal, no murmur, click, rub or gallop GI: soft, non-tender; bowel sounds normal; no masses,  no organomegaly Extremities: extremities normal, atraumatic, no cyanosis or edema  ECOG PERFORMANCE STATUS: 1 - Symptomatic but completely ambulatory  Blood pressure 123/75, pulse 95, temperature 98 F (36.7 C), temperature source Oral, resp. rate 18, height 5' 7.5" (1.715 m), weight 167 lb 6.4 oz (75.9 kg), SpO2 90 %.  LABORATORY DATA: Lab Results  Component Value Date   WBC 6.0 12/18/2017   HGB 12.4 12/18/2017   HCT 40.3 12/18/2017   MCV 87.6 12/18/2017   PLT 229 12/18/2017      Chemistry      Component Value Date/Time   NA 143 11/27/2017 1019   NA 143 03/15/2017 1436   K 3.8 11/27/2017 1019   K 3.7 03/15/2017 1436   CL 102 11/27/2017 1019   CO2 34 (H) 11/27/2017 1019   CO2 34 (H) 03/15/2017 1436   BUN 12 11/27/2017 1019   BUN 17.8 03/15/2017 1436   CREATININE 0.73 11/27/2017 1019   CREATININE 0.8 03/15/2017 1436      Component Value Date/Time   CALCIUM 9.7 11/27/2017 1019   CALCIUM 9.6 03/15/2017 1436   ALKPHOS 117 11/27/2017 1019   ALKPHOS 88 03/15/2017 1436   AST 16 11/27/2017 1019   AST  16 03/15/2017 1436   ALT 9 11/27/2017 1019   ALT 15 03/15/2017 1436   BILITOT 0.5 11/27/2017 1019   BILITOT 0.52 03/15/2017 1436       RADIOGRAPHIC STUDIES: Ct Chest Wo Contrast  Result Date: 11/23/2017 CLINICAL DATA:  77 year old female with history of non-small cell lung cancer diagnosed in November 2018. Status post palliative radiotherapy. Undergoing systemic chemotherapy. Follow-up study. EXAM: CT CHEST WITHOUT CONTRAST TECHNIQUE: Multidetector CT imaging of the chest was performed following the standard protocol without IV contrast. COMPARISON:  Chest CT 09/21/2017. FINDINGS: Cardiovascular: Heart size is normal. There is no significant pericardial fluid, thickening or pericardial calcification. There is aortic atherosclerosis, as well as atherosclerosis of the great vessels of the mediastinum and the coronary arteries, including calcified atherosclerotic plaque in the left main, left anterior descending, left circumflex and right coronary arteries. Calcifications of the aortic valve. Mediastinum/Nodes: No pathologically enlarged mediastinal or hilar lymph nodes.  Please note that accurate exclusion of hilar adenopathy is limited on noncontrast CT scans. Esophagus is unremarkable in appearance. No axillary lymphadenopathy Lungs/Pleura: Large chronic partially loculated right pleural effusion with extensive passive atelectasis and postradiation fibrosis in the right lung. Minimal aerated right upper lobe is noted near the apex. Some chronic postradiation changes are also noted in the medial aspect of the left upper lobe. No definite suspicious appearing pulmonary nodules or masses are noted on today's examination. No acute consolidative airspace disease in the left lung. Previously noted left pleural effusion has nearly completely resolved, now with only a trace amount of residual pleural fluid medially. Upper Abdomen: Aortic atherosclerosis. Musculoskeletal: Chronic compression fractures of T3 and  T12 are unchanged, most severe T12 where there is 30% loss of anterior vertebral body height. There are no aggressive appearing lytic or blastic lesions noted in the visualized portions of the skeleton. IMPRESSION: 1. Large chronic partially loculated right-sided pleural effusion with extensive passive atelectasis and postradiation changes in the right lung, similar to the prior study. No findings on today's examination to strongly suggest residual/recurrent disease. 2. Decreasing trace left pleural effusion. 3. Aortic atherosclerosis, in addition to left main and 3 vessel coronary artery disease. Assessment for potential risk factor modification, dietary therapy or pharmacologic therapy may be warranted, if clinically indicated. 4. There are calcifications of the aortic valve. Echocardiographic correlation for evaluation of potential valvular dysfunction may be warranted if clinically indicated. Aortic Atherosclerosis (ICD10-I70.0). Electronically Signed   By: Vinnie Langton M.D.   On: 11/23/2017 11:13    ASSESSMENT AND PLAN: This is a very pleasant 78 years old white female recently diagnosed with probably stage IV non-small cell lung cancer presented with large right suprahilar lung mass in addition to mediastinal lymphadenopathy and suspicious malignant right pleural effusion.  The patient underwent systemic chemotherapy with carboplatin, paclitaxel and Keytruda status post 3 cycles.  She was tolerating this treatment well with no concerning complaints except for the last cycle when she was admitted to the hospital with C. difficile as well as dehydration and renal insufficiency.   She is currently on treatment with single agent Ketruda (pembrolizumab) status post 7 cycles.  The patient continues to tolerate her treatment well with no concerning adverse effects. I recommended for her to proceed with cycle #8 today as a schedule. I will see her back for follow-up visit in 3 weeks for evaluation before  the next cycle of her treatment. She was advised to call immediately if she has any concerning symptoms in the interval. The patient voices understanding of current disease status and treatment options and is in agreement with the current care plan. All questions were answered. The patient knows to call the clinic with any problems, questions or concerns. We can certainly see the patient much sooner if necessary. I spent 10 minutes counseling the patient face to face. The total time spent in the appointment was 15 minutes.  Disclaimer: This note was dictated with voice recognition software. Similar sounding words can inadvertently be transcribed and may not be corrected upon review.

## 2017-12-18 NOTE — Patient Instructions (Signed)
[facility] Discharge Instructions for Patients Receiving Chemotherapy  Today you received the following chemotherapy agents:  Keytruda.  To help prevent nausea and vomiting after your treatment, we encourage you to take your nausea medication as directed.   If you develop nausea and vomiting that is not controlled by your nausea medication, call the clinic.   BELOW ARE SYMPTOMS THAT SHOULD BE REPORTED IMMEDIATELY:  *FEVER GREATER THAN 100.5 F  *CHILLS WITH OR WITHOUT FEVER  NAUSEA AND VOMITING THAT IS NOT CONTROLLED WITH YOUR NAUSEA MEDICATION  *UNUSUAL SHORTNESS OF BREATH  *UNUSUAL BRUISING OR BLEEDING  TENDERNESS IN MOUTH AND THROAT WITH OR WITHOUT PRESENCE OF ULCERS  *URINARY PROBLEMS  *BOWEL PROBLEMS  UNUSUAL RASH Items with * indicate a potential emergency and should be followed up as soon as possible.  Feel free to call the clinic should you have any questions or concerns. The clinic phone number is [phone].  Please show the CHEMO ALERT CARD at check-in to the Emergency Department and triage nurse.    

## 2018-01-08 ENCOUNTER — Inpatient Hospital Stay: Payer: Medicare Other

## 2018-01-08 ENCOUNTER — Telehealth: Payer: Self-pay

## 2018-01-08 ENCOUNTER — Encounter: Payer: Self-pay | Admitting: Internal Medicine

## 2018-01-08 ENCOUNTER — Inpatient Hospital Stay (HOSPITAL_BASED_OUTPATIENT_CLINIC_OR_DEPARTMENT_OTHER): Payer: Medicare Other | Admitting: Internal Medicine

## 2018-01-08 VITALS — BP 146/99 | HR 94 | Temp 97.5°F | Resp 17 | Ht 67.5 in | Wt 171.2 lb

## 2018-01-08 DIAGNOSIS — C382 Malignant neoplasm of posterior mediastinum: Secondary | ICD-10-CM

## 2018-01-08 DIAGNOSIS — Z5112 Encounter for antineoplastic immunotherapy: Secondary | ICD-10-CM | POA: Diagnosis not present

## 2018-01-08 DIAGNOSIS — C3491 Malignant neoplasm of unspecified part of right bronchus or lung: Secondary | ICD-10-CM | POA: Diagnosis not present

## 2018-01-08 DIAGNOSIS — Z9981 Dependence on supplemental oxygen: Secondary | ICD-10-CM

## 2018-01-08 DIAGNOSIS — R0602 Shortness of breath: Secondary | ICD-10-CM | POA: Diagnosis not present

## 2018-01-08 DIAGNOSIS — Z79899 Other long term (current) drug therapy: Secondary | ICD-10-CM | POA: Diagnosis not present

## 2018-01-08 LAB — CBC WITH DIFFERENTIAL (CANCER CENTER ONLY)
ABS IMMATURE GRANULOCYTES: 0.02 10*3/uL (ref 0.00–0.07)
BASOS PCT: 1 %
Basophils Absolute: 0.1 10*3/uL (ref 0.0–0.1)
EOS ABS: 0.1 10*3/uL (ref 0.0–0.5)
Eosinophils Relative: 1 %
HCT: 41 % (ref 36.0–46.0)
Hemoglobin: 12.7 g/dL (ref 12.0–15.0)
Immature Granulocytes: 0 %
Lymphocytes Relative: 18 %
Lymphs Abs: 1.1 10*3/uL (ref 0.7–4.0)
MCH: 27.1 pg (ref 26.0–34.0)
MCHC: 31 g/dL (ref 30.0–36.0)
MCV: 87.6 fL (ref 80.0–100.0)
MONO ABS: 0.6 10*3/uL (ref 0.1–1.0)
MONOS PCT: 9 %
NEUTROS ABS: 4.3 10*3/uL (ref 1.7–7.7)
Neutrophils Relative %: 71 %
PLATELETS: 207 10*3/uL (ref 150–400)
RBC: 4.68 MIL/uL (ref 3.87–5.11)
RDW: 16.1 % — AB (ref 11.5–15.5)
WBC: 6.1 10*3/uL (ref 4.0–10.5)
nRBC: 0 % (ref 0.0–0.2)

## 2018-01-08 LAB — CMP (CANCER CENTER ONLY)
ALBUMIN: 3.4 g/dL — AB (ref 3.5–5.0)
ALK PHOS: 115 U/L (ref 38–126)
ALT: 18 U/L (ref 0–44)
ANION GAP: 7 (ref 5–15)
AST: 24 U/L (ref 15–41)
BILIRUBIN TOTAL: 0.6 mg/dL (ref 0.3–1.2)
BUN: 14 mg/dL (ref 8–23)
CALCIUM: 9.9 mg/dL (ref 8.9–10.3)
CO2: 33 mmol/L — ABNORMAL HIGH (ref 22–32)
Chloride: 104 mmol/L (ref 98–111)
Creatinine: 0.76 mg/dL (ref 0.44–1.00)
GFR, Est AFR Am: >60 mL/min (ref >60)
GLUCOSE: 70 mg/dL (ref 70–99)
POTASSIUM: 4.1 mmol/L (ref 3.5–5.1)
Sodium: 144 mmol/L (ref 135–145)
TOTAL PROTEIN: 7.7 g/dL (ref 6.5–8.1)

## 2018-01-08 MED ORDER — SODIUM CHLORIDE 0.9 % IV SOLN
200.0000 mg | Freq: Once | INTRAVENOUS | Status: AC
  Administered 2018-01-08: 200 mg via INTRAVENOUS
  Filled 2018-01-08: qty 8

## 2018-01-08 MED ORDER — SODIUM CHLORIDE 0.9 % IV SOLN
Freq: Once | INTRAVENOUS | Status: AC
  Administered 2018-01-08: 15:00:00 via INTRAVENOUS
  Filled 2018-01-08: qty 250

## 2018-01-08 NOTE — Telephone Encounter (Signed)
Printed avs and calender of upcoming appointment. Per 10/29 los 

## 2018-01-08 NOTE — Progress Notes (Signed)
[place] [phone]   Fax:(336) [phone]  OFFICE PROGRESS NOTE  [name], MD [address] [place] [address]  DIAGNOSIS: Metastatic non-small cell carcinoma (T3, N2, M1a) non-small cell lung cancer diagnosed in November 2018 and presented with large right suprahilar and mediastinal mass as well as recent right pleural effusion.  Guardant 360: No actionable mutations.  PRIOR THERAPY:  1) Palliative radiotherapy to the large mediastinal mass under the care of Dr. Lisbeth Renshaw. 2) Systemic chemotherapy with carboplatin for AUC of 5, paclitaxel 175 mg/M2 and Keytruda 200 mg IV every 3 weeks.  First dose expected May 01, 2017.  Status post 3 cycles.  CURRENT THERAPY: Maintenance treatment with immunotherapy with single agent Keytruda 200 mg IV every 3 weeks.  First cycle 07/24/2017.  Status post 8 cycles.  INTERVAL HISTORY: Cassidy Sanders 77 y.o. female returns to the clinic today for follow-up visit accompanied by her daughter.  The patient is feeling fine today with no concerning complaints except for the baseline shortness of breath and she is currently on home oxygen.  She denied having any chest pain, cough or hemoptysis.  She denied having any fever or chills.  She has no nausea, vomiting, diarrhea or constipation.  She denied having any skin rash.  She is here today for evaluation before starting cycle #9.  MEDICAL HISTORY: Past Medical History:  Diagnosis Date  . Arrhythmia   . Atrial fibrillation ([place])   . Brain tumor (benign) ([place])   . Depression   . Dyspnea    home oxygen  . Dysrhythmia    a fib  . History of home oxygen therapy 02/2017   2 L/minute  . Hypertension     ALLERGIES:  is allergic to latex; gabapentin; and tape.  MEDICATIONS:  Current Outpatient Medications  Medication Sig Dispense Refill  . albuterol (PROVENTIL) (2.5 MG/3ML) 0.083% nebulizer solution VVN Q 4 H PRF WHEEZING OR SHORTNESS OF BREATH  2  . Biotin  2.5 MG TABS Take 2,500 mcg by mouth daily.    . clobetasol cream (TEMOVATE) 0.27 % Apply 1 application topically 2 (two) times daily. 60 g 0  . diphenhydrAMINE (BENADRYL) 25 mg capsule Take 1 capsule (25 mg total) by mouth at bedtime as needed for sleep. 30 capsule 0  . feeding supplement, ENSURE ENLIVE, (ENSURE ENLIVE) LIQD Take 237 mLs by mouth 2 (two) times daily as needed (If patient or family requests, if pt unable to eat for a meal, or if intakes are poor). 237 mL 12  . hydrOXYzine (ATARAX/VISTARIL) 10 MG tablet Take 1 tablet (10 mg total) by mouth 3 (three) times daily as needed for itching. 30 tablet 0  . levalbuterol (XOPENEX) 0.63 MG/3ML nebulizer solution Take 3 mLs (0.63 mg total) by nebulization every 6 (six) hours as needed for wheezing or shortness of breath. 3 mL 12  . levothyroxine (SYNTHROID, LEVOTHROID) 100 MCG tablet TAKE 1 TABLET BY MOUTH EVERY DAY BEFORE BREAKFAST 90 tablet 1  . loperamide (IMODIUM) 2 MG capsule Take 2 mg by mouth as needed for diarrhea or loose stools.    . metoprolol tartrate (LOPRESSOR) 25 MG tablet Take 25 mg by mouth daily.     Marland Kitchen oxyCODONE (OXY IR/ROXICODONE) 5 MG immediate release tablet Take 1 tablet by mouth every 2 (two) hours as needed.   0  . Respiratory Therapy Supplies (FLUTTER) DEVI Use as directed. 1 each 0  . sertraline (ZOLOFT) 50 MG tablet Take 50 mg by  mouth daily.     No current facility-administered medications for this visit.     SURGICAL HISTORY:  Past Surgical History:  Procedure Laterality Date  . ABDOMINAL HYSTERECTOMY    . BLADDER SURGERY    . BRAIN SURGERY    . ENDOBRONCHIAL ULTRASOUND Bilateral 02/06/2017   Procedure: ENDOBRONCHIAL ULTRASOUND;  Surgeon: Juanito Doom, MD;  Location: [facility];  Service: Cardiopulmonary;  Laterality: Bilateral;  . IR GUIDED DRAIN W CATHETER PLACEMENT  06/29/2017  . IR REMOVAL OF PLURAL CATH W/CUFF  08/28/2017  . IR US GUIDE BX ASP/DRAIN  06/29/2017  . VIDEO BRONCHOSCOPY WITH  ENDOBRONCHIAL ULTRASOUND N/A 03/23/2017   Procedure: VIDEO BRONCHOSCOPY WITH ENDOBRONCHIAL ULTRASOUND;  Surgeon: Juanito Doom, MD;  Location: [facility] OR;  Service: Thoracic;  Laterality: N/A;    REVIEW OF SYSTEMS:  A comprehensive review of systems was negative except for: Respiratory: positive for dyspnea on exertion   PHYSICAL EXAMINATION: General appearance: alert, cooperative and no distress Head: Normocephalic, without obvious abnormality, atraumatic Neck: no adenopathy, no JVD, supple, symmetrical, trachea midline and thyroid not enlarged, symmetric, no tenderness/mass/nodules Lymph nodes: Cervical, supraclavicular, and axillary nodes normal. Resp: wheezes bilaterally Back: symmetric, no curvature. ROM normal. No CVA tenderness. Cardio: regular rate and rhythm, S1, S2 normal, no murmur, click, rub or gallop GI: soft, non-tender; bowel sounds normal; no masses,  no organomegaly Extremities: extremities normal, atraumatic, no cyanosis or edema  ECOG PERFORMANCE STATUS: 1 - Symptomatic but completely ambulatory  Blood pressure (!) 146/99, pulse 94, temperature (!) 97.5 F (36.4 C), temperature source Oral, resp. rate 17, height 5' 7.5" (1.715 m), weight 171 lb 3.2 oz (77.7 kg), SpO2 93 %.  LABORATORY DATA: Lab Results  Component Value Date   WBC 6.0 12/18/2017   HGB 12.4 12/18/2017   HCT 40.3 12/18/2017   MCV 87.6 12/18/2017   PLT 229 12/18/2017      Chemistry      Component Value Date/Time   NA 144 12/18/2017 1337   NA 143 03/15/2017 1436   K 3.7 12/18/2017 1337   K 3.7 03/15/2017 1436   CL 105 12/18/2017 1337   CO2 31 12/18/2017 1337   CO2 34 (H) 03/15/2017 1436   BUN 16 12/18/2017 1337   BUN 17.8 03/15/2017 1436   CREATININE 0.79 12/18/2017 1337   CREATININE 0.8 03/15/2017 1436      Component Value Date/Time   CALCIUM 9.7 12/18/2017 1337   CALCIUM 9.6 03/15/2017 1436   ALKPHOS 110 12/18/2017 1337   ALKPHOS 88 03/15/2017 1436   AST 19 12/18/2017 1337   AST 16  03/15/2017 1436   ALT 15 12/18/2017 1337   ALT 15 03/15/2017 1436   BILITOT 0.4 12/18/2017 1337   BILITOT 0.52 03/15/2017 1436       RADIOGRAPHIC STUDIES: No results found.  ASSESSMENT AND PLAN: This is a very pleasant 77 years old white female recently diagnosed with probably stage IV non-small cell lung cancer presented with large right suprahilar lung mass in addition to mediastinal lymphadenopathy and suspicious malignant right pleural effusion.  The patient underwent systemic chemotherapy with carboplatin, paclitaxel and Keytruda status post 3 cycles.  She was tolerating this treatment well with no concerning complaints except for the last cycle when she was admitted to the hospital with C. difficile as well as dehydration and renal insufficiency.   She is currently on treatment with single agent Ketruda (pembrolizumab) status post 8 cycles.  The patient continues to tolerate her treatment well with no concerning  adverse effects. I recommended for her to proceed with cycle #today as a schedule. I will see her back for follow-up visit in 3 weeks for evaluation before starting cycle #10. We will repeat CT scan of the chest, abdomen and pelvis after cycle #10 for restaging of her disease. The patient was advised to call immediately if she has any concerning symptoms in the interval. The patient voices understanding of current disease status and treatment options and is in agreement with the current care plan. All questions were answered. The patient knows to call the clinic with any problems, questions or concerns. We can certainly see the patient much sooner if necessary. I spent 10 minutes counseling the patient face to face. The total time spent in the appointment was 15 minutes.  Disclaimer: This note was dictated with voice recognition software. Similar sounding words can inadvertently be transcribed and may not be corrected upon review.

## 2018-01-08 NOTE — Patient Instructions (Signed)
[facility] Discharge Instructions for Patients Receiving Chemotherapy  Today you received the following chemotherapy agents:  Keytruda.  To help prevent nausea and vomiting after your treatment, we encourage you to take your nausea medication as directed.   If you develop nausea and vomiting that is not controlled by your nausea medication, call the clinic.   BELOW ARE SYMPTOMS THAT SHOULD BE REPORTED IMMEDIATELY:  *FEVER GREATER THAN 100.5 F  *CHILLS WITH OR WITHOUT FEVER  NAUSEA AND VOMITING THAT IS NOT CONTROLLED WITH YOUR NAUSEA MEDICATION  *UNUSUAL SHORTNESS OF BREATH  *UNUSUAL BRUISING OR BLEEDING  TENDERNESS IN MOUTH AND THROAT WITH OR WITHOUT PRESENCE OF ULCERS  *URINARY PROBLEMS  *BOWEL PROBLEMS  UNUSUAL RASH Items with * indicate a potential emergency and should be followed up as soon as possible.  Feel free to call the clinic should you have any questions or concerns. The clinic phone number is [phone].  Please show the CHEMO ALERT CARD at check-in to the Emergency Department and triage nurse.    

## 2018-01-29 ENCOUNTER — Inpatient Hospital Stay: Payer: Medicare Other | Attending: Internal Medicine

## 2018-01-29 ENCOUNTER — Telehealth: Payer: Self-pay | Admitting: Internal Medicine

## 2018-01-29 ENCOUNTER — Encounter: Payer: Self-pay | Admitting: Internal Medicine

## 2018-01-29 ENCOUNTER — Inpatient Hospital Stay (HOSPITAL_BASED_OUTPATIENT_CLINIC_OR_DEPARTMENT_OTHER): Payer: Medicare Other | Admitting: Internal Medicine

## 2018-01-29 ENCOUNTER — Inpatient Hospital Stay: Payer: Medicare Other

## 2018-01-29 VITALS — BP 133/84 | HR 95 | Temp 97.5°F | Resp 16 | Ht 67.5 in | Wt 171.8 lb

## 2018-01-29 DIAGNOSIS — Z79899 Other long term (current) drug therapy: Secondary | ICD-10-CM | POA: Insufficient documentation

## 2018-01-29 DIAGNOSIS — C3490 Malignant neoplasm of unspecified part of unspecified bronchus or lung: Secondary | ICD-10-CM

## 2018-01-29 DIAGNOSIS — Z9981 Dependence on supplemental oxygen: Secondary | ICD-10-CM | POA: Insufficient documentation

## 2018-01-29 DIAGNOSIS — R0602 Shortness of breath: Secondary | ICD-10-CM | POA: Insufficient documentation

## 2018-01-29 DIAGNOSIS — C3491 Malignant neoplasm of unspecified part of right bronchus or lung: Secondary | ICD-10-CM

## 2018-01-29 DIAGNOSIS — C382 Malignant neoplasm of posterior mediastinum: Secondary | ICD-10-CM

## 2018-01-29 DIAGNOSIS — Z5112 Encounter for antineoplastic immunotherapy: Secondary | ICD-10-CM | POA: Diagnosis not present

## 2018-01-29 DIAGNOSIS — I10 Essential (primary) hypertension: Secondary | ICD-10-CM

## 2018-01-29 LAB — CBC WITH DIFFERENTIAL (CANCER CENTER ONLY)
ABS IMMATURE GRANULOCYTES: 0.01 10*3/uL (ref 0.00–0.07)
Basophils Absolute: 0 10*3/uL (ref 0.0–0.1)
Basophils Relative: 1 %
Eosinophils Absolute: 0.1 10*3/uL (ref 0.0–0.5)
Eosinophils Relative: 2 %
HCT: 39.1 % (ref 36.0–46.0)
HEMOGLOBIN: 12.4 g/dL (ref 12.0–15.0)
Immature Granulocytes: 0 %
LYMPHS PCT: 21 %
Lymphs Abs: 1.2 10*3/uL (ref 0.7–4.0)
MCH: 27.7 pg (ref 26.0–34.0)
MCHC: 31.7 g/dL (ref 30.0–36.0)
MCV: 87.5 fL (ref 80.0–100.0)
MONO ABS: 0.6 10*3/uL (ref 0.1–1.0)
Monocytes Relative: 11 %
NEUTROS ABS: 3.7 10*3/uL (ref 1.7–7.7)
Neutrophils Relative %: 65 %
Platelet Count: 185 10*3/uL (ref 150–400)
RBC: 4.47 MIL/uL (ref 3.87–5.11)
RDW: 14.8 % (ref 11.5–15.5)
WBC: 5.7 10*3/uL (ref 4.0–10.5)
nRBC: 0 % (ref 0.0–0.2)

## 2018-01-29 LAB — CMP (CANCER CENTER ONLY)
ALBUMIN: 3.2 g/dL — AB (ref 3.5–5.0)
ALT: 15 U/L (ref 0–44)
AST: 20 U/L (ref 15–41)
Alkaline Phosphatase: 114 U/L (ref 38–126)
Anion gap: 9 (ref 5–15)
BUN: 17 mg/dL (ref 8–23)
CHLORIDE: 105 mmol/L (ref 98–111)
CO2: 29 mmol/L (ref 22–32)
Calcium: 9.3 mg/dL (ref 8.9–10.3)
Creatinine: 0.97 mg/dL (ref 0.44–1.00)
GFR, EST NON AFRICAN AMERICAN: 54 mL/min — AB (ref >60)
GFR, Est AFR Am: >60 mL/min (ref >60)
Glucose, Bld: 83 mg/dL (ref 70–99)
POTASSIUM: 4.3 mmol/L (ref 3.5–5.1)
SODIUM: 143 mmol/L (ref 135–145)
Total Bilirubin: 0.5 mg/dL (ref 0.3–1.2)
Total Protein: 7.3 g/dL (ref 6.5–8.1)

## 2018-01-29 MED ORDER — SODIUM CHLORIDE 0.9 % IV SOLN
200.0000 mg | Freq: Once | INTRAVENOUS | Status: AC
  Administered 2018-01-29: 200 mg via INTRAVENOUS
  Filled 2018-01-29: qty 8

## 2018-01-29 MED ORDER — SODIUM CHLORIDE 0.9 % IV SOLN
Freq: Once | INTRAVENOUS | Status: AC
  Administered 2018-01-29: 15:00:00 via INTRAVENOUS
  Filled 2018-01-29: qty 250

## 2018-01-29 NOTE — Progress Notes (Signed)
[place] [phone]   [phone]  OFFICE PROGRESS NOTE  [name], MD [address] [place] [address]  DIAGNOSIS: Metastatic non-small cell carcinoma (T3, N2, M1a) non-small cell lung cancer diagnosed in November 2018 and presented with large right suprahilar and mediastinal mass as well as recent right pleural effusion.  Guardant 360: No actionable mutations.  PRIOR THERAPY:  1) Palliative radiotherapy to the large mediastinal mass under the care of Dr. Lisbeth Renshaw. 2) Systemic chemotherapy with carboplatin for AUC of 5, paclitaxel 175 mg/M2 and Keytruda 200 mg IV every 3 weeks.  First dose expected May 01, 2017.  Status post 3 cycles.  CURRENT THERAPY: Maintenance treatment with immunotherapy with single agent Keytruda 200 mg IV every 3 weeks.  First cycle 07/24/2017.  Status post 12 cycles.  INTERVAL HISTORY: Cassidy Sanders 77 y.o. female returns to the clinic today for follow-up visit accompanied by her daughter [name].  The patient is feeling fine today with no concerning complaints except for the baseline shortness of breath and she is currently on home oxygen.  She denied having any chest pain, cough or hemoptysis.  She denied having any fever or chills.  She has no nausea, vomiting, diarrhea or constipation.  She has no headache or visual changes.  She continues to tolerate her treatment with Keytruda fairly well.  The patient is here today for evaluation before starting cycle #13.  MEDICAL HISTORY: Past Medical History:  Diagnosis Date  . Arrhythmia   . Atrial fibrillation ([place])   . Brain tumor (benign) ([place])   . Depression   . Dyspnea    home oxygen  . Dysrhythmia    a fib  . History of home oxygen therapy 02/2017   2 L/minute  . Hypertension     ALLERGIES:  is allergic to latex; gabapentin; and tape.  MEDICATIONS:  Current Outpatient Medications  Medication Sig Dispense Refill  . albuterol (PROVENTIL) (2.5  MG/3ML) 0.083% nebulizer solution VVN Q 4 H PRF WHEEZING OR SHORTNESS OF BREATH  2  . Biotin 2.5 MG TABS Take 2,500 mcg by mouth daily.    . clobetasol cream (TEMOVATE) 6.75 % Apply 1 application topically 2 (two) times daily. 60 g 0  . diphenhydrAMINE (BENADRYL) 25 mg capsule Take 1 capsule (25 mg total) by mouth at bedtime as needed for sleep. 30 capsule 0  . feeding supplement, ENSURE ENLIVE, (ENSURE ENLIVE) LIQD Take 237 mLs by mouth 2 (two) times daily as needed (If patient or family requests, if pt unable to eat for a meal, or if intakes are poor). 237 mL 12  . hydrOXYzine (ATARAX/VISTARIL) 10 MG tablet Take 1 tablet (10 mg total) by mouth 3 (three) times daily as needed for itching. 30 tablet 0  . levalbuterol (XOPENEX) 0.63 MG/3ML nebulizer solution Take 3 mLs (0.63 mg total) by nebulization every 6 (six) hours as needed for wheezing or shortness of breath. 3 mL 12  . levothyroxine (SYNTHROID, LEVOTHROID) 100 MCG tablet TAKE 1 TABLET BY MOUTH EVERY DAY BEFORE BREAKFAST 90 tablet 1  . loperamide (IMODIUM) 2 MG capsule Take 2 mg by mouth as needed for diarrhea or loose stools.    . metoprolol tartrate (LOPRESSOR) 25 MG tablet Take 25 mg by mouth daily.     Marland Kitchen oxyCODONE (OXY IR/ROXICODONE) 5 MG immediate release tablet Take 1 tablet by mouth every 2 (two) hours as needed.   0  . Respiratory Therapy Supplies (FLUTTER) DEVI Use as directed.  1 each 0  . sertraline (ZOLOFT) 50 MG tablet Take 50 mg by mouth daily.     No current facility-administered medications for this visit.     SURGICAL HISTORY:  Past Surgical History:  Procedure Laterality Date  . ABDOMINAL HYSTERECTOMY    . BLADDER SURGERY    . BRAIN SURGERY    . ENDOBRONCHIAL ULTRASOUND Bilateral 02/06/2017   Procedure: ENDOBRONCHIAL ULTRASOUND;  Surgeon: Juanito Doom, MD;  Location: [facility];  Service: Cardiopulmonary;  Laterality: Bilateral;  . IR GUIDED DRAIN W CATHETER PLACEMENT  06/29/2017  . IR REMOVAL OF PLURAL CATH  W/CUFF  08/28/2017  . IR US GUIDE BX ASP/DRAIN  06/29/2017  . VIDEO BRONCHOSCOPY WITH ENDOBRONCHIAL ULTRASOUND N/A 03/23/2017   Procedure: VIDEO BRONCHOSCOPY WITH ENDOBRONCHIAL ULTRASOUND;  Surgeon: Juanito Doom, MD;  Location: [facility] OR;  Service: Thoracic;  Laterality: N/A;    REVIEW OF SYSTEMS:  A comprehensive review of systems was negative except for: Respiratory: positive for dyspnea on exertion   PHYSICAL EXAMINATION: General appearance: alert, cooperative and no distress Head: Normocephalic, without obvious abnormality, atraumatic Neck: no adenopathy, no JVD, supple, symmetrical, trachea midline and thyroid not enlarged, symmetric, no tenderness/mass/nodules Lymph nodes: Cervical, supraclavicular, and axillary nodes normal. Resp: clear to auscultation bilaterally Back: symmetric, no curvature. ROM normal. No CVA tenderness. Cardio: regular rate and rhythm, S1, S2 normal, no murmur, click, rub or gallop GI: soft, non-tender; bowel sounds normal; no masses,  no organomegaly Extremities: extremities normal, atraumatic, no cyanosis or edema  ECOG PERFORMANCE STATUS: 1 - Symptomatic but completely ambulatory  Blood pressure 133/84, pulse 95, temperature (!) 97.5 F (36.4 C), temperature source Oral, resp. rate 16, height 5' 7.5" (1.715 m), weight 171 lb 12.8 oz (77.9 kg), SpO2 92 %.  LABORATORY DATA: Lab Results  Component Value Date   WBC 6.1 01/08/2018   HGB 12.7 01/08/2018   HCT 41.0 01/08/2018   MCV 87.6 01/08/2018   PLT 207 01/08/2018      Chemistry      Component Value Date/Time   NA 144 01/08/2018 1310   NA 143 03/15/2017 1436   K 4.1 01/08/2018 1310   K 3.7 03/15/2017 1436   CL 104 01/08/2018 1310   CO2 33 (H) 01/08/2018 1310   CO2 34 (H) 03/15/2017 1436   BUN 14 01/08/2018 1310   BUN 17.8 03/15/2017 1436   CREATININE 0.76 01/08/2018 1310   CREATININE 0.8 03/15/2017 1436      Component Value Date/Time   CALCIUM 9.9 01/08/2018 1310   CALCIUM 9.6 03/15/2017  1436   ALKPHOS 115 01/08/2018 1310   ALKPHOS 88 03/15/2017 1436   AST 24 01/08/2018 1310   AST 16 03/15/2017 1436   ALT 18 01/08/2018 1310   ALT 15 03/15/2017 1436   BILITOT 0.6 01/08/2018 1310   BILITOT 0.52 03/15/2017 1436       RADIOGRAPHIC STUDIES: No results found.  ASSESSMENT AND PLAN: This is a very pleasant 77 years old white female recently diagnosed with probably stage IV non-small cell lung cancer presented with large right suprahilar lung mass in addition to mediastinal lymphadenopathy and suspicious malignant right pleural effusion.  The patient underwent systemic chemotherapy with carboplatin, paclitaxel and Keytruda status post 3 cycles.  She was tolerating this treatment well with no concerning complaints except for the last cycle when she was admitted to the hospital with C. difficile as well as dehydration and renal insufficiency.   She is currently on treatment with single agent Ketruda (pembrolizumab)  status post 12 cycles.  The patient continues to tolerate this treatment well with no concerning adverse effects. I recommended for her to proceed with cycle #13 today as scheduled. She will come back for follow-up visit in 3 weeks for evaluation after repeating CT scan of the chest, abdomen and pelvis for restaging of her disease. She was advised to call immediately if she has any concerning symptoms in the interval. The patient voices understanding of current disease status and treatment options and is in agreement with the current care plan. All questions were answered. The patient knows to call the clinic with any problems, questions or concerns. We can certainly see the patient much sooner if necessary. I spent 10 minutes counseling the patient face to face. The total time spent in the appointment was 15 minutes.  Disclaimer: This note was dictated with voice recognition software. Similar sounding words can inadvertently be transcribed and may not be corrected upon  review.

## 2018-01-29 NOTE — Telephone Encounter (Signed)
Gave pt and calendar

## 2018-01-29 NOTE — Patient Instructions (Signed)
[facility] Discharge Instructions for Patients Receiving Chemotherapy  Today you received the following chemotherapy agents:  Keytruda.  To help prevent nausea and vomiting after your treatment, we encourage you to take your nausea medication as directed.   If you develop nausea and vomiting that is not controlled by your nausea medication, call the clinic.   BELOW ARE SYMPTOMS THAT SHOULD BE REPORTED IMMEDIATELY:  *FEVER GREATER THAN 100.5 F  *CHILLS WITH OR WITHOUT FEVER  NAUSEA AND VOMITING THAT IS NOT CONTROLLED WITH YOUR NAUSEA MEDICATION  *UNUSUAL SHORTNESS OF BREATH  *UNUSUAL BRUISING OR BLEEDING  TENDERNESS IN MOUTH AND THROAT WITH OR WITHOUT PRESENCE OF ULCERS  *URINARY PROBLEMS  *BOWEL PROBLEMS  UNUSUAL RASH Items with * indicate a potential emergency and should be followed up as soon as possible.  Feel free to call the clinic should you have any questions or concerns. The clinic phone number is [phone].  Please show the CHEMO ALERT CARD at check-in to the Emergency Department and triage nurse.    

## 2018-02-13 ENCOUNTER — Telehealth: Payer: Self-pay | Admitting: Internal Medicine

## 2018-02-13 NOTE — Telephone Encounter (Signed)
MM PAL 12/10 - moved appointments to 12/11. Spoke with patient.

## 2018-02-15 ENCOUNTER — Ambulatory Visit (HOSPITAL_COMMUNITY)
Admission: RE | Admit: 2018-02-15 | Discharge: 2018-02-15 | Disposition: A | Discharge status: Home / Self Care | Payer: Medicare Other | Source: Ambulatory Visit | Attending: Internal Medicine | Admitting: Internal Medicine

## 2018-02-15 DIAGNOSIS — C3490 Malignant neoplasm of unspecified part of unspecified bronchus or lung: Secondary | ICD-10-CM | POA: Insufficient documentation

## 2018-02-15 DIAGNOSIS — N2889 Other specified disorders of kidney and ureter: Secondary | ICD-10-CM | POA: Diagnosis not present

## 2018-02-15 DIAGNOSIS — C3491 Malignant neoplasm of unspecified part of right bronchus or lung: Secondary | ICD-10-CM | POA: Diagnosis not present

## 2018-02-15 MED ORDER — SODIUM CHLORIDE (PF) 0.9 % IJ SOLN
INTRAMUSCULAR | Status: AC
  Filled 2018-02-15: qty 50

## 2018-02-15 MED ORDER — IOHEXOL 300 MG/ML  SOLN
100.0000 mL | Freq: Once | INTRAMUSCULAR | Status: AC | PRN
  Administered 2018-02-15: 100 mL via INTRAVENOUS

## 2018-02-19 ENCOUNTER — Ambulatory Visit: Payer: Medicare Other

## 2018-02-19 ENCOUNTER — Ambulatory Visit: Payer: Medicare Other | Admitting: Internal Medicine

## 2018-02-19 ENCOUNTER — Other Ambulatory Visit: Payer: Medicare Other

## 2018-02-20 ENCOUNTER — Inpatient Hospital Stay: Payer: Medicare Other | Attending: Internal Medicine

## 2018-02-20 ENCOUNTER — Inpatient Hospital Stay: Payer: Medicare Other

## 2018-02-20 ENCOUNTER — Encounter: Payer: Self-pay | Admitting: Internal Medicine

## 2018-02-20 ENCOUNTER — Other Ambulatory Visit: Payer: Self-pay | Admitting: *Deleted

## 2018-02-20 ENCOUNTER — Inpatient Hospital Stay (HOSPITAL_BASED_OUTPATIENT_CLINIC_OR_DEPARTMENT_OTHER): Payer: Medicare Other | Admitting: Internal Medicine

## 2018-02-20 ENCOUNTER — Telehealth: Payer: Self-pay | Admitting: *Deleted

## 2018-02-20 VITALS — BP 140/67 | HR 99 | Temp 98.1°F | Resp 16 | Ht 67.5 in | Wt 174.6 lb

## 2018-02-20 DIAGNOSIS — Z5112 Encounter for antineoplastic immunotherapy: Secondary | ICD-10-CM | POA: Diagnosis not present

## 2018-02-20 DIAGNOSIS — Z79899 Other long term (current) drug therapy: Secondary | ICD-10-CM | POA: Diagnosis not present

## 2018-02-20 DIAGNOSIS — C382 Malignant neoplasm of posterior mediastinum: Secondary | ICD-10-CM

## 2018-02-20 DIAGNOSIS — R599 Enlarged lymph nodes, unspecified: Secondary | ICD-10-CM

## 2018-02-20 DIAGNOSIS — C3491 Malignant neoplasm of unspecified part of right bronchus or lung: Secondary | ICD-10-CM

## 2018-02-20 DIAGNOSIS — Z9981 Dependence on supplemental oxygen: Secondary | ICD-10-CM | POA: Diagnosis not present

## 2018-02-20 DIAGNOSIS — R0602 Shortness of breath: Secondary | ICD-10-CM

## 2018-02-20 DIAGNOSIS — I10 Essential (primary) hypertension: Secondary | ICD-10-CM

## 2018-02-20 LAB — CMP (CANCER CENTER ONLY)
ALT: 11 U/L (ref 0–44)
ANION GAP: 8 (ref 5–15)
AST: 20 U/L (ref 15–41)
Albumin: 3.2 g/dL — ABNORMAL LOW (ref 3.5–5.0)
Alkaline Phosphatase: 115 U/L (ref 38–126)
BUN: 18 mg/dL (ref 8–23)
CO2: 32 mmol/L (ref 22–32)
Calcium: 9.3 mg/dL (ref 8.9–10.3)
Chloride: 104 mmol/L (ref 98–111)
Creatinine: 0.8 mg/dL (ref 0.44–1.00)
GFR, Est AFR Am: >60 mL/min (ref >60)
GFR, Estimated: >60 mL/min (ref >60)
GLUCOSE: 119 mg/dL — AB (ref 70–99)
Potassium: 3.8 mmol/L (ref 3.5–5.1)
Sodium: 144 mmol/L (ref 135–145)
Total Bilirubin: 0.6 mg/dL (ref 0.3–1.2)
Total Protein: 7.3 g/dL (ref 6.5–8.1)

## 2018-02-20 LAB — CBC WITH DIFFERENTIAL (CANCER CENTER ONLY)
Abs Immature Granulocytes: 0.02 10*3/uL (ref 0.00–0.07)
Basophils Absolute: 0 10*3/uL (ref 0.0–0.1)
Basophils Relative: 1 %
Eosinophils Absolute: 0.1 10*3/uL (ref 0.0–0.5)
Eosinophils Relative: 2 %
HCT: 37.5 % (ref 36.0–46.0)
Hemoglobin: 11.8 g/dL — ABNORMAL LOW (ref 12.0–15.0)
IMMATURE GRANULOCYTES: 0 %
Lymphocytes Relative: 15 %
Lymphs Abs: 0.8 10*3/uL (ref 0.7–4.0)
MCH: 28 pg (ref 26.0–34.0)
MCHC: 31.5 g/dL (ref 30.0–36.0)
MCV: 88.9 fL (ref 80.0–100.0)
Monocytes Absolute: 0.5 10*3/uL (ref 0.1–1.0)
Monocytes Relative: 9 %
NEUTROS PCT: 73 %
Neutro Abs: 3.8 10*3/uL (ref 1.7–7.7)
PLATELETS: 191 10*3/uL (ref 150–400)
RBC: 4.22 MIL/uL (ref 3.87–5.11)
RDW: 14.2 % (ref 11.5–15.5)
WBC Count: 5.3 10*3/uL (ref 4.0–10.5)
nRBC: 0 % (ref 0.0–0.2)

## 2018-02-20 LAB — TSH: TSH: 7.386 u[IU]/mL — ABNORMAL HIGH (ref 0.308–3.960)

## 2018-02-20 MED ORDER — SODIUM CHLORIDE 0.9 % IV SOLN
200.0000 mg | Freq: Once | INTRAVENOUS | Status: AC
  Administered 2018-02-20: 200 mg via INTRAVENOUS
  Filled 2018-02-20: qty 8

## 2018-02-20 MED ORDER — SODIUM CHLORIDE 0.9 % IV SOLN
Freq: Once | INTRAVENOUS | Status: AC
  Administered 2018-02-20: 14:00:00 via INTRAVENOUS
  Filled 2018-02-20: qty 250

## 2018-02-20 NOTE — Patient Instructions (Signed)
[facility] Discharge Instructions for Patients Receiving Chemotherapy  Today you received the following chemotherapy agents:  Keytruda.  To help prevent nausea and vomiting after your treatment, we encourage you to take your nausea medication as directed.   If you develop nausea and vomiting that is not controlled by your nausea medication, call the clinic.   BELOW ARE SYMPTOMS THAT SHOULD BE REPORTED IMMEDIATELY:  *FEVER GREATER THAN 100.5 F  *CHILLS WITH OR WITHOUT FEVER  NAUSEA AND VOMITING THAT IS NOT CONTROLLED WITH YOUR NAUSEA MEDICATION  *UNUSUAL SHORTNESS OF BREATH  *UNUSUAL BRUISING OR BLEEDING  TENDERNESS IN MOUTH AND THROAT WITH OR WITHOUT PRESENCE OF ULCERS  *URINARY PROBLEMS  *BOWEL PROBLEMS  UNUSUAL RASH Items with * indicate a potential emergency and should be followed up as soon as possible.  Feel free to call the clinic should you have any questions or concerns. The clinic phone number is [phone].  Please show the CHEMO ALERT CARD at check-in to the Emergency Department and triage nurse.    

## 2018-02-20 NOTE — Progress Notes (Signed)
[facility] Telephone:([phone]   [phone]  OFFICE PROGRESS NOTE  [name], MD [address] [place] [address]  DIAGNOSIS: Metastatic non-small cell carcinoma (T3, N2, M1a) non-small cell lung cancer diagnosed in November 2018 and presented with large right suprahilar and mediastinal mass as well as recent right pleural effusion.  Guardant 360: No actionable mutations.  PRIOR THERAPY:  1) Palliative radiotherapy to the large mediastinal mass under the care of Dr. Lisbeth Renshaw. 2) Systemic chemotherapy with carboplatin for AUC of 5, paclitaxel 175 mg/M2 and Keytruda 200 mg IV every 3 weeks.  First dose expected May 01, 2017.  Status post 3 cycles.  CURRENT THERAPY: Maintenance treatment with immunotherapy with single agent Keytruda 200 mg IV every 3 weeks.  First cycle 07/24/2017.  Status post 13 cycles.  INTERVAL HISTORY: Cassidy Sanders 77 y.o. female returns to the clinic today for follow-up visit accompanied by her daughter.  The patient is feeling fine today with no concerning complaints except for the baseline shortness of breath and she is currently on home oxygen.  She denied having any chest pain, cough or hemoptysis.  She denied having any recent weight loss or night sweats.  She has no nausea, vomiting, diarrhea or constipation.  She has a small areas of skin rash as well as itching.  She has been tolerating her treatment with Keytruda fairly well.  She is here today for evaluation with repeat CT scan of the chest, abdomen and pelvis for restaging of her disease before starting cycle #14.  MEDICAL HISTORY: Past Medical History:  Diagnosis Date  . Arrhythmia   . Atrial fibrillation ([place])   . Brain tumor (benign) ([place])   . Depression   . Dyspnea    home oxygen  . Dysrhythmia    a fib  . History of home oxygen therapy 02/2017   2 L/minute  . Hypertension     ALLERGIES:  is allergic to latex; gabapentin; and tape.  MEDICATIONS:    Current Outpatient Medications  Medication Sig Dispense Refill  . albuterol (PROVENTIL) (2.5 MG/3ML) 0.083% nebulizer solution VVN Q 4 H PRF WHEEZING OR SHORTNESS OF BREATH  2  . Biotin 2.5 MG TABS Take 2,500 mcg by mouth daily.    . clobetasol cream (TEMOVATE) 2.02 % Apply 1 application topically 2 (two) times daily. 60 g 0  . diphenhydrAMINE (BENADRYL) 25 mg capsule Take 1 capsule (25 mg total) by mouth at bedtime as needed for sleep. 30 capsule 0  . levalbuterol (XOPENEX) 0.63 MG/3ML nebulizer solution Take 3 mLs (0.63 mg total) by nebulization every 6 (six) hours as needed for wheezing or shortness of breath. 3 mL 12  . levothyroxine (SYNTHROID, LEVOTHROID) 100 MCG tablet TAKE 1 TABLET BY MOUTH EVERY DAY BEFORE BREAKFAST 90 tablet 1  . metoprolol tartrate (LOPRESSOR) 25 MG tablet Take 25 mg by mouth daily.     Marland Kitchen Respiratory Therapy Supplies (FLUTTER) DEVI Use as directed. 1 each 0  . sertraline (ZOLOFT) 50 MG tablet Take 50 mg by mouth daily.    . feeding supplement, ENSURE ENLIVE, (ENSURE ENLIVE) LIQD Take 237 mLs by mouth 2 (two) times daily as needed (If patient or family requests, if pt unable to eat for a meal, or if intakes are poor). (Patient not taking: Reported on 02/20/2018) 237 mL 12  . hydrOXYzine (ATARAX/VISTARIL) 10 MG tablet Take 1 tablet (10 mg total) by mouth 3 (three) times daily as needed for itching. (Patient not  taking: Reported on 02/20/2018) 30 tablet 0  . loperamide (IMODIUM) 2 MG capsule Take 2 mg by mouth as needed for diarrhea or loose stools.    Marland Kitchen oxyCODONE (OXY IR/ROXICODONE) 5 MG immediate release tablet Take 1 tablet by mouth every 2 (two) hours as needed.   0   No current facility-administered medications for this visit.     SURGICAL HISTORY:  Past Surgical History:  Procedure Laterality Date  . ABDOMINAL HYSTERECTOMY    . BLADDER SURGERY    . BRAIN SURGERY    . ENDOBRONCHIAL ULTRASOUND Bilateral 02/06/2017   Procedure: ENDOBRONCHIAL ULTRASOUND;   Surgeon: Juanito Doom, MD;  Location: [facility];  Service: Cardiopulmonary;  Laterality: Bilateral;  . IR GUIDED DRAIN W CATHETER PLACEMENT  06/29/2017  . IR REMOVAL OF PLURAL CATH W/CUFF  08/28/2017  . IR US GUIDE BX ASP/DRAIN  06/29/2017  . VIDEO BRONCHOSCOPY WITH ENDOBRONCHIAL ULTRASOUND N/A 03/23/2017   Procedure: VIDEO BRONCHOSCOPY WITH ENDOBRONCHIAL ULTRASOUND;  Surgeon: Juanito Doom, MD;  Location: [facility];  Service: Thoracic;  Laterality: N/A;    REVIEW OF SYSTEMS:  Constitutional: positive for fatigue Eyes: negative Ears, nose, mouth, throat, and face: negative Respiratory: positive for dyspnea on exertion Cardiovascular: negative Gastrointestinal: negative Genitourinary:negative Integument/breast: negative Hematologic/lymphatic: negative Musculoskeletal:negative Neurological: negative Behavioral/Psych: negative Endocrine: negative Allergic/Immunologic: negative   PHYSICAL EXAMINATION: General appearance: alert, cooperative, fatigued and no distress Head: Normocephalic, without obvious abnormality, atraumatic Neck: no adenopathy, no JVD, supple, symmetrical, trachea midline and thyroid not enlarged, symmetric, no tenderness/mass/nodules Lymph nodes: Cervical, supraclavicular, and axillary nodes normal. Resp: clear to auscultation bilaterally Back: symmetric, no curvature. ROM normal. No CVA tenderness. Cardio: regular rate and rhythm, S1, S2 normal, no murmur, click, rub or gallop GI: soft, non-tender; bowel sounds normal; no masses,  no organomegaly Extremities: extremities normal, atraumatic, no cyanosis or edema Neurologic: Alert and oriented X 3, normal strength and tone. Normal symmetric reflexes. Normal coordination and gait  ECOG PERFORMANCE STATUS: 1 - Symptomatic but completely ambulatory  Blood pressure 140/67, pulse 99, temperature 98.1 F (36.7 C), temperature source Oral, resp. rate 16, height 5' 7.5" (1.715 m), weight 174 lb 9.6 oz (79.2 kg), SpO2  90 %.  LABORATORY DATA: Lab Results  Component Value Date   WBC 5.3 02/20/2018   HGB 11.8 (L) 02/20/2018   HCT 37.5 02/20/2018   MCV 88.9 02/20/2018   PLT 191 02/20/2018      Chemistry      Component Value Date/Time   NA 143 01/29/2018 1334   NA 143 03/15/2017 1436   K 4.3 01/29/2018 1334   K 3.7 03/15/2017 1436   CL 105 01/29/2018 1334   CO2 29 01/29/2018 1334   CO2 34 (H) 03/15/2017 1436   BUN 17 01/29/2018 1334   BUN 17.8 03/15/2017 1436   CREATININE 0.97 01/29/2018 1334   CREATININE 0.8 03/15/2017 1436      Component Value Date/Time   CALCIUM 9.3 01/29/2018 1334   CALCIUM 9.6 03/15/2017 1436   ALKPHOS 114 01/29/2018 1334   ALKPHOS 88 03/15/2017 1436   AST 20 01/29/2018 1334   AST 16 03/15/2017 1436   ALT 15 01/29/2018 1334   ALT 15 03/15/2017 1436   BILITOT 0.5 01/29/2018 1334   BILITOT 0.52 03/15/2017 1436       RADIOGRAPHIC STUDIES: Ct Chest W Contrast  Result Date: 02/15/2018 CLINICAL DATA:  Restaging of right lung cancer. Cough and hemoptysis last night. EXAM: CT CHEST, ABDOMEN, AND PELVIS WITH CONTRAST TECHNIQUE: Multidetector CT  imaging of the chest, abdomen and pelvis was performed following the standard protocol during bolus administration of intravenous contrast. CONTRAST:  167mL OMNIPAQUE IOHEXOL 300 MG/ML  SOLN COMPARISON:  Multiple exams, including chest CT from 11/23/2017 and abdomen CT 09/21/2017 FINDINGS: CT CHEST FINDINGS Cardiovascular: Coronary, aortic arch, and branch vessel atherosclerotic vascular disease. Mild cardiomegaly. Mediastinum/Nodes: Thyroid nodules as before. Low-density left supraclavicular node 1.2 cm in short axis on image 9/2, formerly 0.9 cm. A lymph node in the pericardial adipose tissue on image 41/2 measures 0.9 cm in short axis, formerly 0.7 cm. Lungs/Pleura: Large right pleural effusion with several loculations. Associated volume loss in much of the right hemithorax but with improved aeration in the right lower lobe compared  to 11/23/2017. New trace left pleural effusion. Peribronchovascular airspace opacity in the right lung with an occluded right upper lobe bronchus, narrowing of the right middle lobe tracheobronchial tree, occlusion of the medial right middle lobe bronchus, and truncation of the superior segmental branch of the right lower lobe tracheobronchial tree with narrowing of the remaining right lower lobe bronchus but not with the object occlusion seen on 11/23/2017. There is also some narrowing of the right mainstem bronchus proximally. Superior segment left lower lobe pulmonary nodule 0.5 by 0.5 cm on image 55/4, previously 0.4 by 0.3 cm. Ground-glass density pulmonary nodule in the left upper lobe 0.9 cm in diameter on image 48/4, stable. Left paramediastinal consolidation with volume loss on image 52/4, stable, query prior radiation therapy. Minimal subpleural nodularity scarring in the lingula. Musculoskeletal: Chronic superior endplate compression fracture at T12 with mild chronic bony retropulsion. Acquired partial anterior interbody fusion T6-7. Subtle superior endplate compression/wedging at T3, unchanged. CT ABDOMEN PELVIS FINDINGS Hepatobiliary: Dependent density in the gallbladder favoring tiny gallstones. Otherwise unremarkable. Pancreas: Unremarkable Spleen: Unremarkable Adrenals/Urinary Tract: Low-density left adrenal mass 1.9 by 2.4 cm on image 57/2 compatible with adrenal adenoma. Similar low-density right adrenal mass measuring 1.2 by 1.8 cm on image 58/2 compatible with adrenal adenoma. Pelvic floor laxity with cystocele. Vascular calcification in the right renal hilum. Mild scarring in the right kidney. Small hypodense lesion in the right mid upper kidney laterally on image 112/5 is probably a cyst but technically too small to characterize. Other tiny hypodense lesions are also present in both kidneys technically too small to characterize. Stomach/Bowel: Unremarkable Vascular/Lymphatic: Aortoiliac  atherosclerotic vascular disease. There is unusually early venous return from the left gonadal vein and left renal vein. This raise possibility of some sort of vascular malformation in the left adnexa leading to the early venous return. I don't appreciate any renal vein thrombosis. There some small retroperitoneal varices not significantly changed from prior exams, and clustered venous structures adjacent to the left ovary. Reproductive: No appreciable adnexal mass. Uterus is surgically absent. Other: No supplemental non-categorized findings. Musculoskeletal: Pelvic floor laxity. Mild levoconvex lumbar scoliosis with rotary component. IMPRESSION: 1. Improved aeration in the right lung, in particular with improved aeration in the right lower lobe resolution of the prior total occlusion of the right lower lobe bronchus. There is continued occlusion of the right upper lobe bronchus and some continued narrowing of the right middle lobe bronchus with truncation of the medial branch. 2. A superior segment left lower lobe nodule has slightly enlarged, previously 0.4 cm and currently 0.5 cm in diameter. 3. Mildly enlarged (1.2 cm in short axis) but low-density left supraclavicular lymph node is nonspecific, but malignancy is not excluded. 4. Stable 9 mm ground-glass opacity in the left upper lobe, nonspecific  but meriting surveillance. 5. Bilateral but right greater than left airspace opacities especially in the perihilar regions favoring radiation pneumonitis/radiation fibrosis. 6. No current findings of malignancy in the abdomen/pelvis. 7. 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 The possibility of a vascular malformation along the left adnexa resulting in this early venous return is braced. 8. Other imaging findings of potential clinical significance: Aortic Atherosclerosis (ICD10-I70.0). Coronary atherosclerosis with mild cardiomegaly. Continued large right pleural effusion with some loculation. New  trace left pleural effusion. Bilateral adrenal adenomas. Mild scarring in the right kidney. Pelvic floor laxity with cystocele. Electronically Signed   By: Van Clines M.D.   On: 02/15/2018 16:42   Ct Abdomen Pelvis W Contrast  Result Date: 02/15/2018 CLINICAL DATA:  Restaging of right lung cancer. Cough and hemoptysis last night. EXAM: CT CHEST, ABDOMEN, AND PELVIS WITH CONTRAST TECHNIQUE: Multidetector CT imaging of the chest, abdomen and pelvis was performed following the standard protocol during bolus administration of intravenous contrast. CONTRAST:  161mL OMNIPAQUE IOHEXOL 300 MG/ML  SOLN COMPARISON:  Multiple exams, including chest CT from 11/23/2017 and abdomen CT 09/21/2017 FINDINGS: CT CHEST FINDINGS Cardiovascular: Coronary, aortic arch, and branch vessel atherosclerotic vascular disease. Mild cardiomegaly. Mediastinum/Nodes: Thyroid nodules as before. Low-density left supraclavicular node 1.2 cm in short axis on image 9/2, formerly 0.9 cm. A lymph node in the pericardial adipose tissue on image 41/2 measures 0.9 cm in short axis, formerly 0.7 cm. Lungs/Pleura: Large right pleural effusion with several loculations. Associated volume loss in much of the right hemithorax but with improved aeration in the right lower lobe compared to 11/23/2017. New trace left pleural effusion. Peribronchovascular airspace opacity in the right lung with an occluded right upper lobe bronchus, narrowing of the right middle lobe tracheobronchial tree, occlusion of the medial right middle lobe bronchus, and truncation of the superior segmental branch of the right lower lobe tracheobronchial tree with narrowing of the remaining right lower lobe bronchus but not with the object occlusion seen on 11/23/2017. There is also some narrowing of the right mainstem bronchus proximally. Superior segment left lower lobe pulmonary nodule 0.5 by 0.5 cm on image 55/4, previously 0.4 by 0.3 cm. Ground-glass density pulmonary nodule  in the left upper lobe 0.9 cm in diameter on image 48/4, stable. Left paramediastinal consolidation with volume loss on image 52/4, stable, query prior radiation therapy. Minimal subpleural nodularity scarring in the lingula. Musculoskeletal: Chronic superior endplate compression fracture at T12 with mild chronic bony retropulsion. Acquired partial anterior interbody fusion T6-7. Subtle superior endplate compression/wedging at T3, unchanged. CT ABDOMEN PELVIS FINDINGS Hepatobiliary: Dependent density in the gallbladder favoring tiny gallstones. Otherwise unremarkable. Pancreas: Unremarkable Spleen: Unremarkable Adrenals/Urinary Tract: Low-density left adrenal mass 1.9 by 2.4 cm on image 57/2 compatible with adrenal adenoma. Similar low-density right adrenal mass measuring 1.2 by 1.8 cm on image 58/2 compatible with adrenal adenoma. Pelvic floor laxity with cystocele. Vascular calcification in the right renal hilum. Mild scarring in the right kidney. Small hypodense lesion in the right mid upper kidney laterally on image 112/5 is probably a cyst but technically too small to characterize. Other tiny hypodense lesions are also present in both kidneys technically too small to characterize. Stomach/Bowel: Unremarkable Vascular/Lymphatic: Aortoiliac atherosclerotic vascular disease. There is unusually early venous return from the left gonadal vein and left renal vein. This raise possibility of some sort of vascular malformation in the left adnexa leading to the early venous return. I don't appreciate any renal vein thrombosis. There some  small retroperitoneal varices not significantly changed from prior exams, and clustered venous structures adjacent to the left ovary. Reproductive: No appreciable adnexal mass. Uterus is surgically absent. Other: No supplemental non-categorized findings. Musculoskeletal: Pelvic floor laxity. Mild levoconvex lumbar scoliosis with rotary component. IMPRESSION: 1. Improved aeration in the  right lung, in particular with improved aeration in the right lower lobe resolution of the prior total occlusion of the right lower lobe bronchus. There is continued occlusion of the right upper lobe bronchus and some continued narrowing of the right middle lobe bronchus with truncation of the medial branch. 2. A superior segment left lower lobe nodule has slightly enlarged, previously 0.4 cm and currently 0.5 cm in diameter. 3. Mildly enlarged (1.2 cm in short axis) but low-density left supraclavicular lymph node is nonspecific, but malignancy is not excluded. 4. Stable 9 mm ground-glass opacity in the left upper lobe, nonspecific but meriting surveillance. 5. Bilateral but right greater than left airspace opacities especially in the perihilar regions favoring radiation pneumonitis/radiation fibrosis. 6. No current findings of malignancy in the abdomen/pelvis. 7. 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 The possibility of a vascular malformation along the left adnexa resulting in this early venous return is braced. 8. Other imaging findings of potential clinical significance: Aortic Atherosclerosis (ICD10-I70.0). Coronary atherosclerosis with mild cardiomegaly. Continued large right pleural effusion with some loculation. New trace left pleural effusion. Bilateral adrenal adenomas. Mild scarring in the right kidney. Pelvic floor laxity with cystocele. Electronically Signed   By: Van Clines M.D.   On: 02/15/2018 16:42    ASSESSMENT AND PLAN: This is a very pleasant 77 years old white female recently diagnosed with probably stage IV non-small cell lung cancer presented with large right suprahilar lung mass in addition to mediastinal lymphadenopathy and suspicious malignant right pleural effusion.  The patient underwent systemic chemotherapy with carboplatin, paclitaxel and Keytruda status post 3 cycles.  She was tolerating this treatment well with no concerning complaints except for the  last cycle when she was admitted to the hospital with C. difficile as well as dehydration and renal insufficiency.   She is currently on treatment with single agent Ketruda (pembrolizumab) status post 13 cycles.  The patient has been tolerating this treatment well with no concerning adverse effects. She had repeat CT scan of the chest, abdomen and pelvis performed recently.  I personally and independently reviewed the scan images and discussed the results with the patient and her daughter today.  Her scan showed no concerning findings for disease progression but there was development of a small left supraclavicular lymph nodes that need attention on upcoming imaging studies. I recommended for the patient to continue her current treatment with [facility] and she will proceed with cycle #14 today. I will see her back for follow-up visit in 3 weeks for evaluation before the next cycle of her treatment. She was advised to call immediately if she has any concerning symptoms in the interval. The patient voices understanding of current disease status and treatment options and is in agreement with the current care plan. All questions were answered. The patient knows to call the clinic with any problems, questions or concerns. We can certainly see the patient much sooner if necessary.  Disclaimer: This note was dictated with voice recognition software. Similar sounding words can inadvertently be transcribed and may not be corrected upon review.

## 2018-02-20 NOTE — Telephone Encounter (Signed)
Returned call to [employer] daughter Cassidy Sanders with appointment information.  "Mom gave two different times 10:45 am and 1:00 pmto my sister and I."    Instructed to arrive at 1:00 pm for registration process.  No further questions or needs at this time.

## 2018-02-21 ENCOUNTER — Telehealth: Payer: Self-pay | Admitting: Internal Medicine

## 2018-02-21 NOTE — Telephone Encounter (Signed)
Scheduled apt per 12/11 los - pt to get an updated schedule next visit  .

## 2018-03-05 IMAGING — CT CT HEAD WO/W CM
3 of 4 series · 15 of 47 positions shown, 18 images · IV contrast (iopamidol)
Comparison: None.

CLINICAL DATA: Staging non-small-cell lung cancer.

EXAM:
CT HEAD WITHOUT AND WITH CONTRAST
TECHNIQUE: Contiguous axial images were obtained from the base of the skull
through the vertex without and with intravenous contrast
CONTRAST:  75mL NMXVZZ-ANN IOPAMIDOL (NMXVZZ-ANN) INJECTION 61%

[Series 2: head wo · axial · 0.41mm/px · z∈[+1557,+1682]mm · 9 of 31 slices shown, 12 images]
[im 3/31  brain]
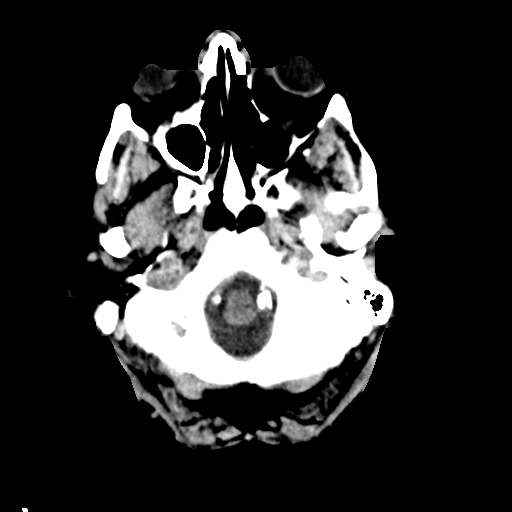
[im 3/31  bone]
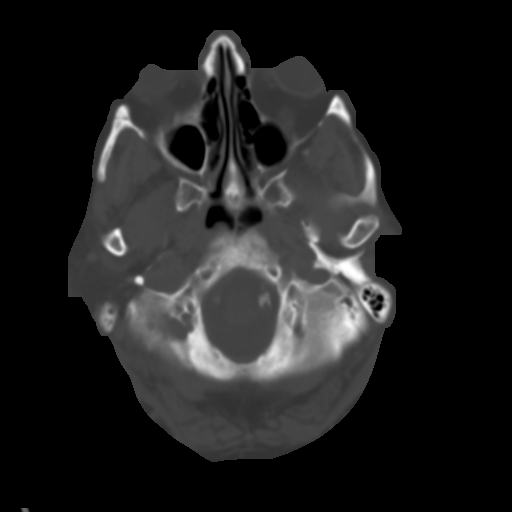
[im 7/31  brain]
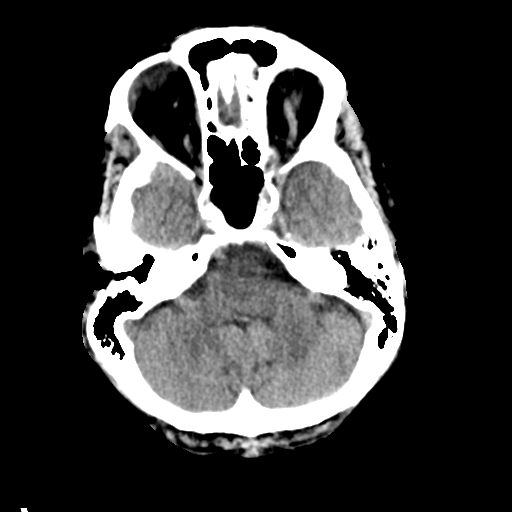
[im 9/31  brain]
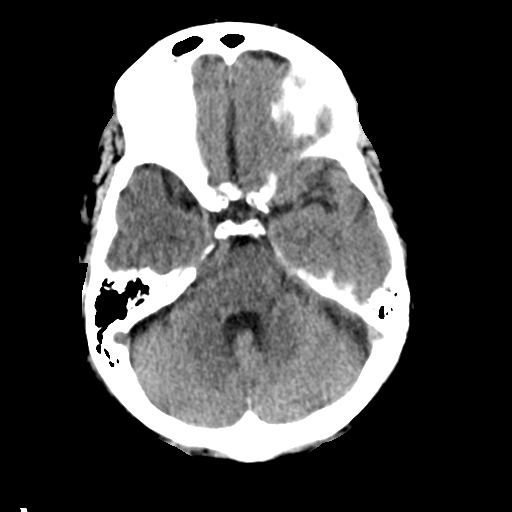
[im 13/31  brain]
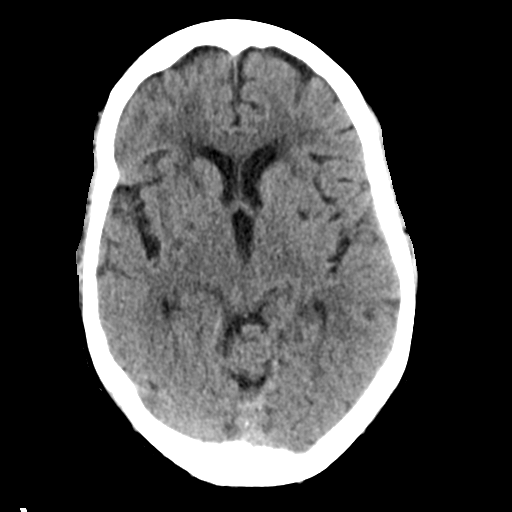
[im 16/31  brain]
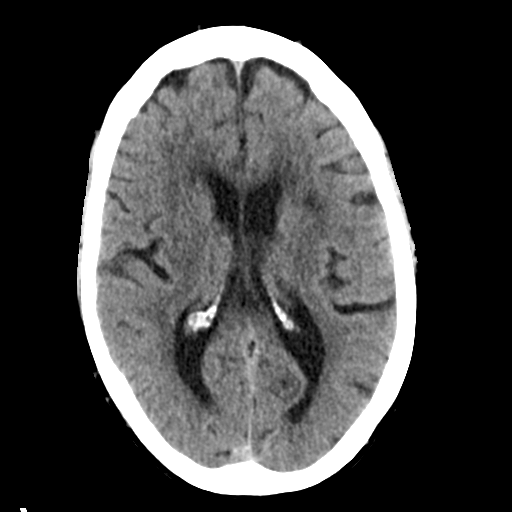
[im 16/31  bone]
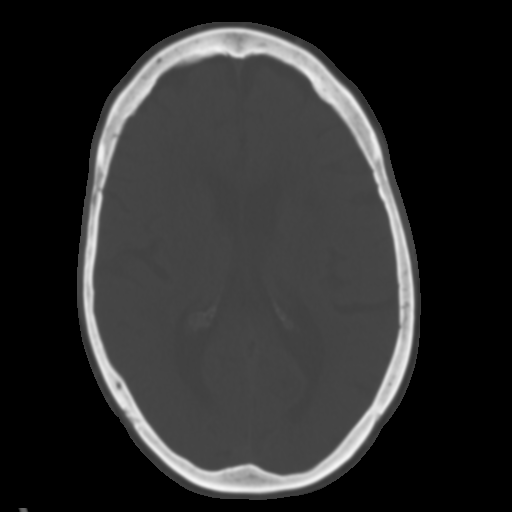
[im 18/31  brain]
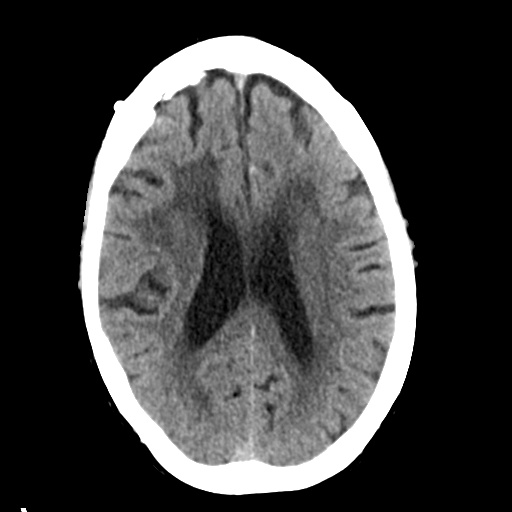
[im 22/31  brain]
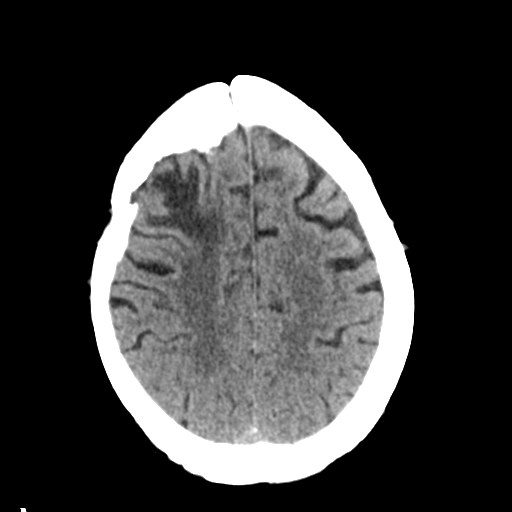
[im 24/31  brain]
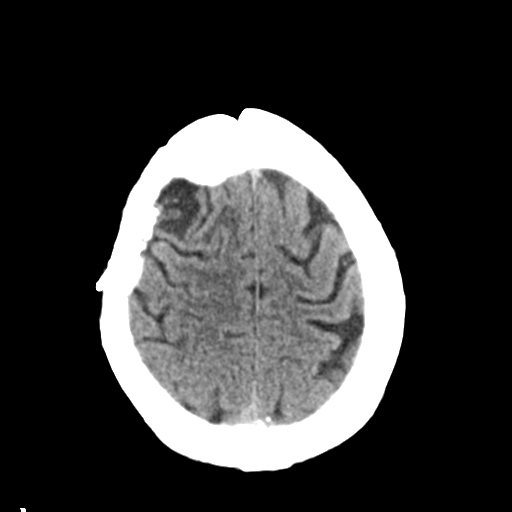
[im 28/31  brain]
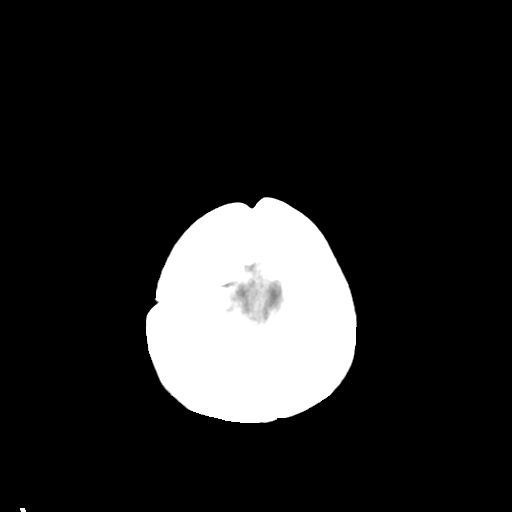
[im 28/31  bone]
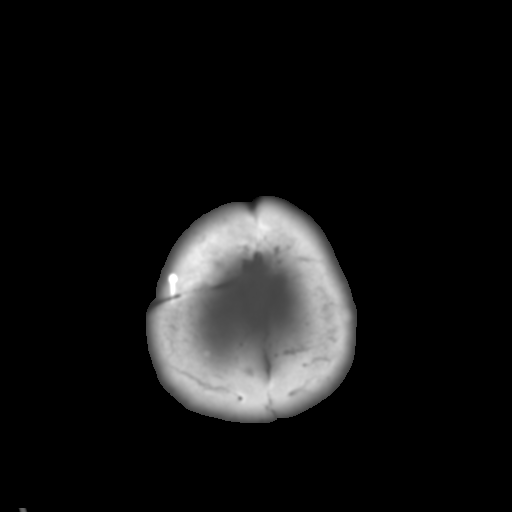

[Series 5: coronal soft tissue · coronal · 0.30mm/px · 3 of 69 slices shown]
[im 23/69  brain]
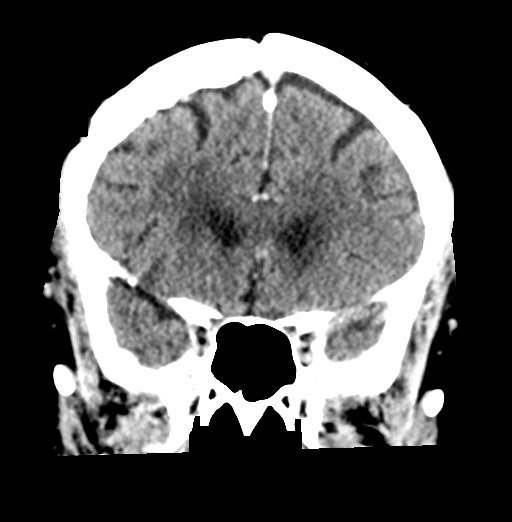
[im 31/69  brain]
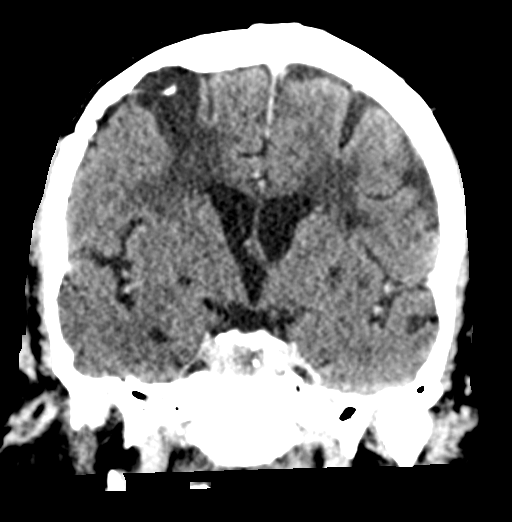
[im 38/69  brain]
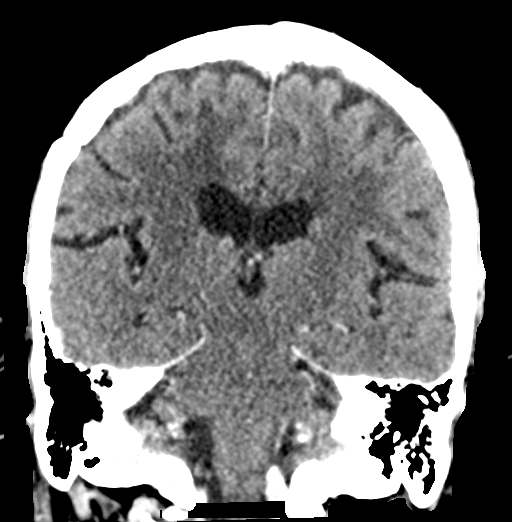

[Series 6: sagittal soft tissue · sagittal · 0.30mm/px · 3 of 53 slices shown]
[im 18/53  brain]
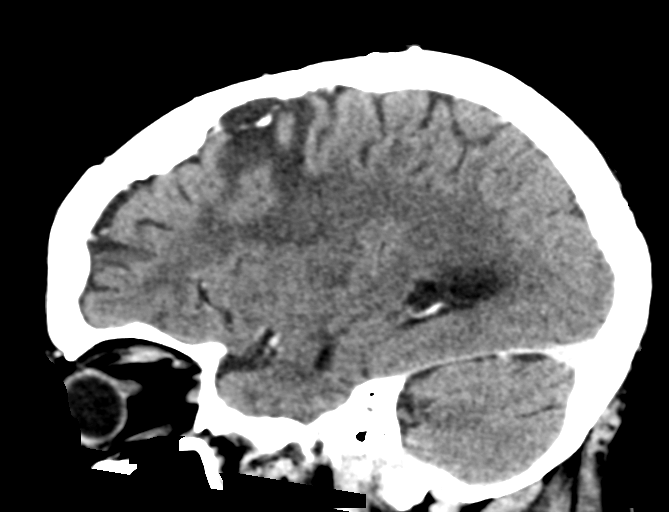
[im 27/53  brain]
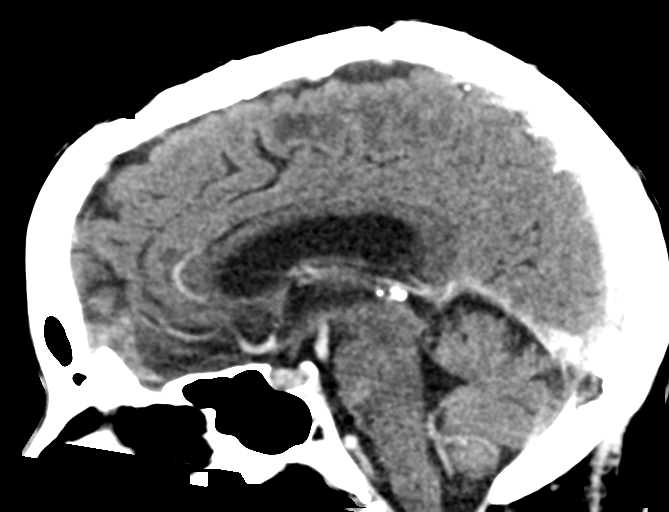
[im 35/53  brain]
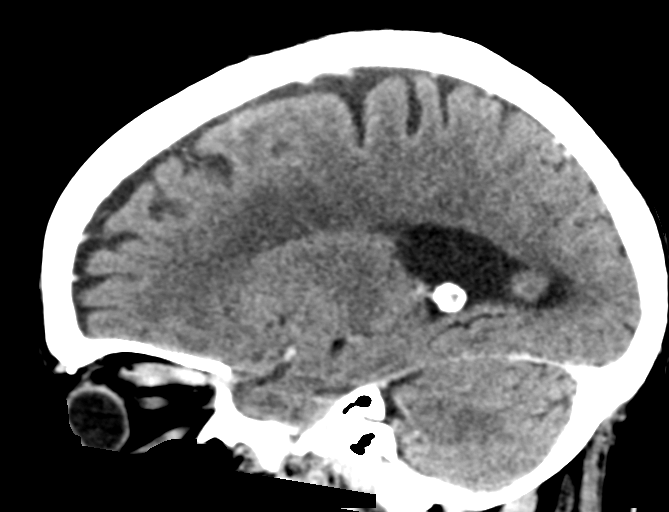

[15 of 47 positions shown; findings below may reference images not displayed]

FINDINGS: Brain: No definite metastatic disease. There is a background pattern
of chronic small vessel ischemic changes of the basal ganglia and
hemispheric white matter. There are 3 findings were the of
discussion. There has been previous right frontal craniotomy and
there is atrophy and gliosis in the underlying right frontal lobe
with some dural calcification. There is an apparently extra-axial
abnormality to the left of the falx in the frontal region that
measures about 7 mm in diameter and may enhance slightly.
Differential diagnosis for this is meningioma versus aneurysm versus
metastasis. There is a punctate hyperdensity in the left frontal
lobe on axial image 22 which is indeterminate. This could be a
vessel or a punctate metastasis. No hydrocephalus. No subdural
collection.

Vascular: There is atherosclerotic calcification of the major
vessels at the base of the brain.

Skull: Right frontal craniotomy changes.  No acute bone finding.

Sinuses/Orbits: Clear/normal

Other: None
IMPRESSION: No definite metastatic disease. Punctate bright focus in the left
frontal lobe on image 22 could be a surface vessel or tiny
metastasis. 7 mm probably extra-axial abnormality along the left
side of the falx in the frontal region axial image 19 could
represent the small meningioma, aneurysm or surface metastasis.
Right frontal atrophy and gliosis with dural calcification but no
evidence of mass. These abnormalities could be followed or
investigated further with MRI.

## 2018-03-11 ENCOUNTER — Inpatient Hospital Stay (HOSPITAL_BASED_OUTPATIENT_CLINIC_OR_DEPARTMENT_OTHER): Payer: Medicare Other | Admitting: Internal Medicine

## 2018-03-11 ENCOUNTER — Telehealth: Payer: Self-pay | Admitting: Internal Medicine

## 2018-03-11 ENCOUNTER — Encounter: Payer: Self-pay | Admitting: Internal Medicine

## 2018-03-11 ENCOUNTER — Inpatient Hospital Stay: Payer: Medicare Other

## 2018-03-11 VITALS — BP 137/87 | HR 92 | Temp 98.1°F | Resp 22 | Ht 67.0 in | Wt 177.4 lb

## 2018-03-11 DIAGNOSIS — Z5112 Encounter for antineoplastic immunotherapy: Secondary | ICD-10-CM | POA: Diagnosis not present

## 2018-03-11 DIAGNOSIS — Z79899 Other long term (current) drug therapy: Secondary | ICD-10-CM | POA: Diagnosis not present

## 2018-03-11 DIAGNOSIS — I10 Essential (primary) hypertension: Secondary | ICD-10-CM

## 2018-03-11 DIAGNOSIS — C3491 Malignant neoplasm of unspecified part of right bronchus or lung: Secondary | ICD-10-CM | POA: Diagnosis not present

## 2018-03-11 DIAGNOSIS — C382 Malignant neoplasm of posterior mediastinum: Secondary | ICD-10-CM

## 2018-03-11 LAB — CBC WITH DIFFERENTIAL (CANCER CENTER ONLY)
Abs Immature Granulocytes: 0.02 10*3/uL (ref 0.00–0.07)
Basophils Absolute: 0 10*3/uL (ref 0.0–0.1)
Basophils Relative: 1 %
Eosinophils Absolute: 0.2 10*3/uL (ref 0.0–0.5)
Eosinophils Relative: 3 %
HCT: 39.7 % (ref 36.0–46.0)
Hemoglobin: 12.4 g/dL (ref 12.0–15.0)
Immature Granulocytes: 0 %
LYMPHS ABS: 1 10*3/uL (ref 0.7–4.0)
Lymphocytes Relative: 18 %
MCH: 27.9 pg (ref 26.0–34.0)
MCHC: 31.2 g/dL (ref 30.0–36.0)
MCV: 89.4 fL (ref 80.0–100.0)
Monocytes Absolute: 0.5 10*3/uL (ref 0.1–1.0)
Monocytes Relative: 9 %
NEUTROS PCT: 69 %
Neutro Abs: 3.7 10*3/uL (ref 1.7–7.7)
Platelet Count: 175 10*3/uL (ref 150–400)
RBC: 4.44 MIL/uL (ref 3.87–5.11)
RDW: 13.5 % (ref 11.5–15.5)
WBC Count: 5.4 10*3/uL (ref 4.0–10.5)
nRBC: 0 % (ref 0.0–0.2)

## 2018-03-11 LAB — CMP (CANCER CENTER ONLY)
ALBUMIN: 3.1 g/dL — AB (ref 3.5–5.0)
ALT: 15 U/L (ref 0–44)
ANION GAP: 8 (ref 5–15)
AST: 21 U/L (ref 15–41)
Alkaline Phosphatase: 116 U/L (ref 38–126)
BUN: 14 mg/dL (ref 8–23)
CO2: 30 mmol/L (ref 22–32)
Calcium: 9.3 mg/dL (ref 8.9–10.3)
Chloride: 104 mmol/L (ref 98–111)
Creatinine: 0.73 mg/dL (ref 0.44–1.00)
GFR, Estimated: >60 mL/min (ref >60)
GLUCOSE: 102 mg/dL — AB (ref 70–99)
Potassium: 3.8 mmol/L (ref 3.5–5.1)
SODIUM: 142 mmol/L (ref 135–145)
Total Bilirubin: 0.5 mg/dL (ref 0.3–1.2)
Total Protein: 7.2 g/dL (ref 6.5–8.1)

## 2018-03-11 MED ORDER — SODIUM CHLORIDE 0.9 % IV SOLN
Freq: Once | INTRAVENOUS | Status: AC
  Administered 2018-03-11: 15:00:00 via INTRAVENOUS
  Filled 2018-03-11: qty 250

## 2018-03-11 MED ORDER — SODIUM CHLORIDE 0.9 % IV SOLN
200.0000 mg | Freq: Once | INTRAVENOUS | Status: AC
  Administered 2018-03-11: 200 mg via INTRAVENOUS
  Filled 2018-03-11: qty 8

## 2018-03-11 NOTE — Patient Instructions (Signed)
[facility] Discharge Instructions for Patients Receiving Chemotherapy  Today you received the following chemotherapy agents :  Keytruda.  To help prevent nausea and vomiting after your treatment, we encourage you to take your nausea medication as prescribed.   If you develop nausea and vomiting that is not controlled by your nausea medication, call the clinic.   BELOW ARE SYMPTOMS THAT SHOULD BE REPORTED IMMEDIATELY:  *FEVER GREATER THAN 100.5 F  *CHILLS WITH OR WITHOUT FEVER  NAUSEA AND VOMITING THAT IS NOT CONTROLLED WITH YOUR NAUSEA MEDICATION  *UNUSUAL SHORTNESS OF BREATH  *UNUSUAL BRUISING OR BLEEDING  TENDERNESS IN MOUTH AND THROAT WITH OR WITHOUT PRESENCE OF ULCERS  *URINARY PROBLEMS  *BOWEL PROBLEMS  UNUSUAL RASH Items with * indicate a potential emergency and should be followed up as soon as possible.  Feel free to call the clinic should you have any questions or concerns. The clinic phone number is [phone].  Please show the CHEMO ALERT CARD at check-in to the Emergency Department and triage nurse.  

## 2018-03-11 NOTE — Telephone Encounter (Signed)
Printed calendar and avs. °

## 2018-03-11 NOTE — Progress Notes (Signed)
[place] [phone]   [phone]  OFFICE PROGRESS NOTE  [name], MD [address] [place] [address]  DIAGNOSIS: Metastatic non-small cell carcinoma (T3, N2, M1a) non-small cell lung cancer diagnosed in November 2018 and presented with large right suprahilar and mediastinal mass as well as recent right pleural effusion.  Guardant 360: No actionable mutations.  PRIOR THERAPY:  1) Palliative radiotherapy to the large mediastinal mass under the care of Dr. Lisbeth Renshaw. 2) Systemic chemotherapy with carboplatin for AUC of 5, paclitaxel 175 mg/M2 and Keytruda 200 mg IV every 3 weeks.  First dose expected May 01, 2017.  Status post 3 cycles.  CURRENT THERAPY: Maintenance treatment with immunotherapy with single agent Keytruda 200 mg IV every 3 weeks.  First cycle 07/24/2017.  Status post 14 cycles.  INTERVAL HISTORY: Cassidy Sanders 77 y.o. female returns to the clinic today for follow-up visit accompanied by her daughter [name].  The patient is doing fine today with no concerning complaints except for the baseline shortness of breath and she is currently on home oxygen.  She denied having any chest pain, cough or hemoptysis.  She denied having any fever or chills.  She has no nausea, vomiting, diarrhea or constipation.  She denied having any skin rash.  She continues to tolerate her treatment with Keytruda fairly well.  The patient is here today for evaluation before starting cycle #15.  MEDICAL HISTORY: Past Medical History:  Diagnosis Date  . Arrhythmia   . Atrial fibrillation ([place])   . Brain tumor (benign) ([place])   . Depression   . Dyspnea    home oxygen  . Dysrhythmia    a fib  . History of home oxygen therapy 02/2017   2 L/minute  . Hypertension     ALLERGIES:  is allergic to latex; gabapentin; and tape.  MEDICATIONS:  Current Outpatient Medications  Medication Sig Dispense Refill  . albuterol (PROVENTIL) (2.5 MG/3ML)  0.083% nebulizer solution VVN Q 4 H PRF WHEEZING OR SHORTNESS OF BREATH  2  . Biotin 2.5 MG TABS Take 2,500 mcg by mouth daily.    . clobetasol cream (TEMOVATE) 2.75 % Apply 1 application topically 2 (two) times daily. 60 g 0  . diphenhydrAMINE (BENADRYL) 25 mg capsule Take 1 capsule (25 mg total) by mouth at bedtime as needed for sleep. 30 capsule 0  . feeding supplement, ENSURE ENLIVE, (ENSURE ENLIVE) LIQD Take 237 mLs by mouth 2 (two) times daily as needed (If patient or family requests, if pt unable to eat for a meal, or if intakes are poor). (Patient not taking: Reported on 02/20/2018) 237 mL 12  . hydrOXYzine (ATARAX/VISTARIL) 10 MG tablet Take 1 tablet (10 mg total) by mouth 3 (three) times daily as needed for itching. (Patient not taking: Reported on 02/20/2018) 30 tablet 0  . levalbuterol (XOPENEX) 0.63 MG/3ML nebulizer solution Take 3 mLs (0.63 mg total) by nebulization every 6 (six) hours as needed for wheezing or shortness of breath. 3 mL 12  . levothyroxine (SYNTHROID, LEVOTHROID) 100 MCG tablet TAKE 1 TABLET BY MOUTH EVERY DAY BEFORE BREAKFAST 90 tablet 1  . loperamide (IMODIUM) 2 MG capsule Take 2 mg by mouth as needed for diarrhea or loose stools.    . metoprolol tartrate (LOPRESSOR) 25 MG tablet Take 25 mg by mouth daily.     Marland Kitchen oxyCODONE (OXY IR/ROXICODONE) 5 MG immediate release tablet Take 1 tablet by mouth every 2 (two) hours as needed.  0  . Respiratory Therapy Supplies (FLUTTER) DEVI Use as directed. 1 each 0  . sertraline (ZOLOFT) 50 MG tablet Take 50 mg by mouth daily.     No current facility-administered medications for this visit.     SURGICAL HISTORY:  Past Surgical History:  Procedure Laterality Date  . ABDOMINAL HYSTERECTOMY    . BLADDER SURGERY    . BRAIN SURGERY    . ENDOBRONCHIAL ULTRASOUND Bilateral 02/06/2017   Procedure: ENDOBRONCHIAL ULTRASOUND;  Surgeon: Juanito Doom, MD;  Location: [facility];  Service: Cardiopulmonary;  Laterality: Bilateral;    . IR GUIDED DRAIN W CATHETER PLACEMENT  06/29/2017  . IR REMOVAL OF PLURAL CATH W/CUFF  08/28/2017  . IR US GUIDE BX ASP/DRAIN  06/29/2017  . VIDEO BRONCHOSCOPY WITH ENDOBRONCHIAL ULTRASOUND N/A 03/23/2017   Procedure: VIDEO BRONCHOSCOPY WITH ENDOBRONCHIAL ULTRASOUND;  Surgeon: Juanito Doom, MD;  Location: [facility] OR;  Service: Thoracic;  Laterality: N/A;    REVIEW OF SYSTEMS:  A comprehensive review of systems was negative except for: Respiratory: positive for dyspnea on exertion   PHYSICAL EXAMINATION: General appearance: alert, cooperative and no distress Head: Normocephalic, without obvious abnormality, atraumatic Neck: no adenopathy, no JVD, supple, symmetrical, trachea midline and thyroid not enlarged, symmetric, no tenderness/mass/nodules Lymph nodes: Cervical, supraclavicular, and axillary nodes normal. Resp: clear to auscultation bilaterally Back: symmetric, no curvature. ROM normal. No CVA tenderness. Cardio: regular rate and rhythm, S1, S2 normal, no murmur, click, rub or gallop GI: soft, non-tender; bowel sounds normal; no masses,  no organomegaly Extremities: extremities normal, atraumatic, no cyanosis or edema  ECOG PERFORMANCE STATUS: 1 - Symptomatic but completely ambulatory  Blood pressure 137/87, pulse 92, temperature 98.1 F (36.7 C), temperature source Oral, resp. rate (!) 22, height 5\' 7"  (1.702 m), weight 177 lb 6.4 oz (80.5 kg), SpO2 (!) 89 %.  LABORATORY DATA: Lab Results  Component Value Date   WBC 5.4 03/11/2018   HGB 12.4 03/11/2018   HCT 39.7 03/11/2018   MCV 89.4 03/11/2018   PLT 175 03/11/2018      Chemistry      Component Value Date/Time   NA 144 02/20/2018 1308   NA 143 03/15/2017 1436   K 3.8 02/20/2018 1308   K 3.7 03/15/2017 1436   CL 104 02/20/2018 1308   CO2 32 02/20/2018 1308   CO2 34 (H) 03/15/2017 1436   BUN 18 02/20/2018 1308   BUN 17.8 03/15/2017 1436   CREATININE 0.80 02/20/2018 1308   CREATININE 0.8 03/15/2017 1436       Component Value Date/Time   CALCIUM 9.3 02/20/2018 1308   CALCIUM 9.6 03/15/2017 1436   ALKPHOS 115 02/20/2018 1308   ALKPHOS 88 03/15/2017 1436   AST 20 02/20/2018 1308   AST 16 03/15/2017 1436   ALT 11 02/20/2018 1308   ALT 15 03/15/2017 1436   BILITOT 0.6 02/20/2018 1308   BILITOT 0.52 03/15/2017 1436       RADIOGRAPHIC STUDIES: Ct Chest W Contrast  Result Date: 02/15/2018 CLINICAL DATA:  Restaging of right lung cancer. Cough and hemoptysis last night. EXAM: CT CHEST, ABDOMEN, AND PELVIS WITH CONTRAST TECHNIQUE: Multidetector CT imaging of the chest, abdomen and pelvis was performed following the standard protocol during bolus administration of intravenous contrast. CONTRAST:  126mL OMNIPAQUE IOHEXOL 300 MG/ML  SOLN COMPARISON:  Multiple exams, including chest CT from 11/23/2017 and abdomen CT 09/21/2017 FINDINGS: CT CHEST FINDINGS Cardiovascular: Coronary, aortic arch, and branch vessel atherosclerotic vascular disease. Mild cardiomegaly. Mediastinum/Nodes: Thyroid nodules as  before. Low-density left supraclavicular node 1.2 cm in short axis on image 9/2, formerly 0.9 cm. A lymph node in the pericardial adipose tissue on image 41/2 measures 0.9 cm in short axis, formerly 0.7 cm. Lungs/Pleura: Large right pleural effusion with several loculations. Associated volume loss in much of the right hemithorax but with improved aeration in the right lower lobe compared to 11/23/2017. New trace left pleural effusion. Peribronchovascular airspace opacity in the right lung with an occluded right upper lobe bronchus, narrowing of the right middle lobe tracheobronchial tree, occlusion of the medial right middle lobe bronchus, and truncation of the superior segmental branch of the right lower lobe tracheobronchial tree with narrowing of the remaining right lower lobe bronchus but not with the object occlusion seen on 11/23/2017. There is also some narrowing of the right mainstem bronchus proximally. Superior  segment left lower lobe pulmonary nodule 0.5 by 0.5 cm on image 55/4, previously 0.4 by 0.3 cm. Ground-glass density pulmonary nodule in the left upper lobe 0.9 cm in diameter on image 48/4, stable. Left paramediastinal consolidation with volume loss on image 52/4, stable, query prior radiation therapy. Minimal subpleural nodularity scarring in the lingula. Musculoskeletal: Chronic superior endplate compression fracture at T12 with mild chronic bony retropulsion. Acquired partial anterior interbody fusion T6-7. Subtle superior endplate compression/wedging at T3, unchanged. CT ABDOMEN PELVIS FINDINGS Hepatobiliary: Dependent density in the gallbladder favoring tiny gallstones. Otherwise unremarkable. Pancreas: Unremarkable Spleen: Unremarkable Adrenals/Urinary Tract: Low-density left adrenal mass 1.9 by 2.4 cm on image 57/2 compatible with adrenal adenoma. Similar low-density right adrenal mass measuring 1.2 by 1.8 cm on image 58/2 compatible with adrenal adenoma. Pelvic floor laxity with cystocele. Vascular calcification in the right renal hilum. Mild scarring in the right kidney. Small hypodense lesion in the right mid upper kidney laterally on image 112/5 is probably a cyst but technically too small to characterize. Other tiny hypodense lesions are also present in both kidneys technically too small to characterize. Stomach/Bowel: Unremarkable Vascular/Lymphatic: Aortoiliac atherosclerotic vascular disease. There is unusually early venous return from the left gonadal vein and left renal vein. This raise possibility of some sort of vascular malformation in the left adnexa leading to the early venous return. I don't appreciate any renal vein thrombosis. There some small retroperitoneal varices not significantly changed from prior exams, and clustered venous structures adjacent to the left ovary. Reproductive: No appreciable adnexal mass. Uterus is surgically absent. Other: No supplemental non-categorized findings.  Musculoskeletal: Pelvic floor laxity. Mild levoconvex lumbar scoliosis with rotary component. IMPRESSION: 1. Improved aeration in the right lung, in particular with improved aeration in the right lower lobe resolution of the prior total occlusion of the right lower lobe bronchus. There is continued occlusion of the right upper lobe bronchus and some continued narrowing of the right middle lobe bronchus with truncation of the medial branch. 2. A superior segment left lower lobe nodule has slightly enlarged, previously 0.4 cm and currently 0.5 cm in diameter. 3. Mildly enlarged (1.2 cm in short axis) but low-density left supraclavicular lymph node is nonspecific, but malignancy is not excluded. 4. Stable 9 mm ground-glass opacity in the left upper lobe, nonspecific but meriting surveillance. 5. Bilateral but right greater than left airspace opacities especially in the perihilar regions favoring radiation pneumonitis/radiation fibrosis. 6. No current findings of malignancy in the abdomen/pelvis. 7. 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 The possibility of a vascular malformation along the left adnexa resulting in this early venous return is braced. 8.  Other imaging findings of potential clinical significance: Aortic Atherosclerosis (ICD10-I70.0). Coronary atherosclerosis with mild cardiomegaly. Continued large right pleural effusion with some loculation. New trace left pleural effusion. Bilateral adrenal adenomas. Mild scarring in the right kidney. Pelvic floor laxity with cystocele. Electronically Signed   By: Van Clines M.D.   On: 02/15/2018 16:42   Ct Abdomen Pelvis W Contrast  Result Date: 02/15/2018 CLINICAL DATA:  Restaging of right lung cancer. Cough and hemoptysis last night. EXAM: CT CHEST, ABDOMEN, AND PELVIS WITH CONTRAST TECHNIQUE: Multidetector CT imaging of the chest, abdomen and pelvis was performed following the standard protocol during bolus administration of  intravenous contrast. CONTRAST:  163mL OMNIPAQUE IOHEXOL 300 MG/ML  SOLN COMPARISON:  Multiple exams, including chest CT from 11/23/2017 and abdomen CT 09/21/2017 FINDINGS: CT CHEST FINDINGS Cardiovascular: Coronary, aortic arch, and branch vessel atherosclerotic vascular disease. Mild cardiomegaly. Mediastinum/Nodes: Thyroid nodules as before. Low-density left supraclavicular node 1.2 cm in short axis on image 9/2, formerly 0.9 cm. A lymph node in the pericardial adipose tissue on image 41/2 measures 0.9 cm in short axis, formerly 0.7 cm. Lungs/Pleura: Large right pleural effusion with several loculations. Associated volume loss in much of the right hemithorax but with improved aeration in the right lower lobe compared to 11/23/2017. New trace left pleural effusion. Peribronchovascular airspace opacity in the right lung with an occluded right upper lobe bronchus, narrowing of the right middle lobe tracheobronchial tree, occlusion of the medial right middle lobe bronchus, and truncation of the superior segmental branch of the right lower lobe tracheobronchial tree with narrowing of the remaining right lower lobe bronchus but not with the object occlusion seen on 11/23/2017. There is also some narrowing of the right mainstem bronchus proximally. Superior segment left lower lobe pulmonary nodule 0.5 by 0.5 cm on image 55/4, previously 0.4 by 0.3 cm. Ground-glass density pulmonary nodule in the left upper lobe 0.9 cm in diameter on image 48/4, stable. Left paramediastinal consolidation with volume loss on image 52/4, stable, query prior radiation therapy. Minimal subpleural nodularity scarring in the lingula. Musculoskeletal: Chronic superior endplate compression fracture at T12 with mild chronic bony retropulsion. Acquired partial anterior interbody fusion T6-7. Subtle superior endplate compression/wedging at T3, unchanged. CT ABDOMEN PELVIS FINDINGS Hepatobiliary: Dependent density in the gallbladder favoring tiny  gallstones. Otherwise unremarkable. Pancreas: Unremarkable Spleen: Unremarkable Adrenals/Urinary Tract: Low-density left adrenal mass 1.9 by 2.4 cm on image 57/2 compatible with adrenal adenoma. Similar low-density right adrenal mass measuring 1.2 by 1.8 cm on image 58/2 compatible with adrenal adenoma. Pelvic floor laxity with cystocele. Vascular calcification in the right renal hilum. Mild scarring in the right kidney. Small hypodense lesion in the right mid upper kidney laterally on image 112/5 is probably a cyst but technically too small to characterize. Other tiny hypodense lesions are also present in both kidneys technically too small to characterize. Stomach/Bowel: Unremarkable Vascular/Lymphatic: Aortoiliac atherosclerotic vascular disease. There is unusually early venous return from the left gonadal vein and left renal vein. This raise possibility of some sort of vascular malformation in the left adnexa leading to the early venous return. I don't appreciate any renal vein thrombosis. There some small retroperitoneal varices not significantly changed from prior exams, and clustered venous structures adjacent to the left ovary. Reproductive: No appreciable adnexal mass. Uterus is surgically absent. Other: No supplemental non-categorized findings. Musculoskeletal: Pelvic floor laxity. Mild levoconvex lumbar scoliosis with rotary component. IMPRESSION: 1. Improved aeration in the right lung, in particular with improved aeration in the right lower lobe  resolution of the prior total occlusion of the right lower lobe bronchus. There is continued occlusion of the right upper lobe bronchus and some continued narrowing of the right middle lobe bronchus with truncation of the medial branch. 2. A superior segment left lower lobe nodule has slightly enlarged, previously 0.4 cm and currently 0.5 cm in diameter. 3. Mildly enlarged (1.2 cm in short axis) but low-density left supraclavicular lymph node is nonspecific, but  malignancy is not excluded. 4. Stable 9 mm ground-glass opacity in the left upper lobe, nonspecific but meriting surveillance. 5. Bilateral but right greater than left airspace opacities especially in the perihilar regions favoring radiation pneumonitis/radiation fibrosis. 6. No current findings of malignancy in the abdomen/pelvis. 7. 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 The possibility of a vascular malformation along the left adnexa resulting in this early venous return is braced. 8. Other imaging findings of potential clinical significance: Aortic Atherosclerosis (ICD10-I70.0). Coronary atherosclerosis with mild cardiomegaly. Continued large right pleural effusion with some loculation. New trace left pleural effusion. Bilateral adrenal adenomas. Mild scarring in the right kidney. Pelvic floor laxity with cystocele. Electronically Signed   By: Van Clines M.D.   On: 02/15/2018 16:42    ASSESSMENT AND PLAN: This is a very pleasant 77 years old white female recently diagnosed with probably stage IV non-small cell lung cancer presented with large right suprahilar lung mass in addition to mediastinal lymphadenopathy and suspicious malignant right pleural effusion.  The patient underwent systemic chemotherapy with carboplatin, paclitaxel and Keytruda status post 3 cycles.  She was tolerating this treatment well with no concerning complaints except for the last cycle when she was admitted to the hospital with C. difficile as well as dehydration and renal insufficiency.   She is currently on treatment with single agent Ketruda (pembrolizumab) status post 14 cycles.  The patient has been tolerating this treatment well with no concerning adverse effects. I recommended for her to proceed with cycle #15 today as scheduled. I will see her back for follow-up visit in 3 weeks for evaluation before starting cycle #16. The patient was advised to call immediately if she has any concerning  symptoms in the interval. The patient voices understanding of current disease status and treatment options and is in agreement with the current care plan. All questions were answered. The patient knows to call the clinic with any problems, questions or concerns. We can certainly see the patient much sooner if necessary.  Disclaimer: This note was dictated with voice recognition software. Similar sounding words can inadvertently be transcribed and may not be corrected upon review.

## 2018-03-15 ENCOUNTER — Telehealth: Payer: Self-pay | Admitting: Pulmonary Disease

## 2018-03-15 NOTE — Telephone Encounter (Signed)
Attempted to contact pt. I did not receive an answer. I have left a message for pt to return our call.  

## 2018-03-18 MED ORDER — ALBUTEROL SULFATE (2.5 MG/3ML) 0.083% IN NEBU: INHALATION_SOLUTION | RESPIRATORY_TRACT | 2 refills | Status: DC

## 2018-03-18 NOTE — Telephone Encounter (Signed)
Spoke with pt, needs refill of albuterol neb solution sent to pharmacy.  This has been sent as requested.  Nothing further needed.

## 2018-04-02 ENCOUNTER — Inpatient Hospital Stay (HOSPITAL_BASED_OUTPATIENT_CLINIC_OR_DEPARTMENT_OTHER): Payer: Medicare Other | Admitting: Internal Medicine

## 2018-04-02 ENCOUNTER — Encounter: Payer: Self-pay | Admitting: Internal Medicine

## 2018-04-02 ENCOUNTER — Inpatient Hospital Stay: Payer: Medicare Other | Attending: Internal Medicine

## 2018-04-02 ENCOUNTER — Telehealth: Payer: Self-pay | Admitting: Internal Medicine

## 2018-04-02 ENCOUNTER — Inpatient Hospital Stay: Payer: Medicare Other

## 2018-04-02 VITALS — BP 109/66 | HR 97 | Temp 97.4°F | Resp 20 | Ht 67.0 in | Wt 176.5 lb

## 2018-04-02 DIAGNOSIS — Z9981 Dependence on supplemental oxygen: Secondary | ICD-10-CM

## 2018-04-02 DIAGNOSIS — Z5112 Encounter for antineoplastic immunotherapy: Secondary | ICD-10-CM

## 2018-04-02 DIAGNOSIS — C3491 Malignant neoplasm of unspecified part of right bronchus or lung: Secondary | ICD-10-CM | POA: Diagnosis not present

## 2018-04-02 DIAGNOSIS — Z79899 Other long term (current) drug therapy: Secondary | ICD-10-CM | POA: Diagnosis not present

## 2018-04-02 DIAGNOSIS — C382 Malignant neoplasm of posterior mediastinum: Secondary | ICD-10-CM

## 2018-04-02 DIAGNOSIS — R0609 Other forms of dyspnea: Secondary | ICD-10-CM | POA: Diagnosis not present

## 2018-04-02 DIAGNOSIS — I10 Essential (primary) hypertension: Secondary | ICD-10-CM

## 2018-04-02 LAB — CBC WITH DIFFERENTIAL (CANCER CENTER ONLY)
Abs Immature Granulocytes: 0.01 10*3/uL (ref 0.00–0.07)
Basophils Absolute: 0 10*3/uL (ref 0.0–0.1)
Basophils Relative: 1 %
Eosinophils Absolute: 0.1 10*3/uL (ref 0.0–0.5)
Eosinophils Relative: 2 %
HCT: 37.2 % (ref 36.0–46.0)
Hemoglobin: 11.8 g/dL — ABNORMAL LOW (ref 12.0–15.0)
Immature Granulocytes: 0 %
Lymphocytes Relative: 19 %
Lymphs Abs: 1.1 10*3/uL (ref 0.7–4.0)
MCH: 28.6 pg (ref 26.0–34.0)
MCHC: 31.7 g/dL (ref 30.0–36.0)
MCV: 90.3 fL (ref 80.0–100.0)
MONO ABS: 0.6 10*3/uL (ref 0.1–1.0)
MONOS PCT: 10 %
Neutro Abs: 3.8 10*3/uL (ref 1.7–7.7)
Neutrophils Relative %: 68 %
Platelet Count: 171 10*3/uL (ref 150–400)
RBC: 4.12 MIL/uL (ref 3.87–5.11)
RDW: 13.4 % (ref 11.5–15.5)
WBC Count: 5.6 10*3/uL (ref 4.0–10.5)
nRBC: 0 % (ref 0.0–0.2)

## 2018-04-02 LAB — CMP (CANCER CENTER ONLY)
ALT: 12 U/L (ref 0–44)
AST: 17 U/L (ref 15–41)
Albumin: 3.2 g/dL — ABNORMAL LOW (ref 3.5–5.0)
Alkaline Phosphatase: 120 U/L (ref 38–126)
Anion gap: 9 (ref 5–15)
BUN: 20 mg/dL (ref 8–23)
CO2: 30 mmol/L (ref 22–32)
Calcium: 9.4 mg/dL (ref 8.9–10.3)
Chloride: 102 mmol/L (ref 98–111)
Creatinine: 0.73 mg/dL (ref 0.44–1.00)
GFR, Est AFR Am: >60 mL/min (ref >60)
Glucose, Bld: 97 mg/dL (ref 70–99)
Potassium: 4.1 mmol/L (ref 3.5–5.1)
Sodium: 141 mmol/L (ref 135–145)
Total Bilirubin: 0.6 mg/dL (ref 0.3–1.2)
Total Protein: 7.3 g/dL (ref 6.5–8.1)

## 2018-04-02 MED ORDER — SODIUM CHLORIDE 0.9 % IV SOLN
Freq: Once | INTRAVENOUS | Status: AC
  Administered 2018-04-02: 15:00:00 via INTRAVENOUS
  Filled 2018-04-02: qty 250

## 2018-04-02 MED ORDER — SODIUM CHLORIDE 0.9 % IV SOLN
200.0000 mg | Freq: Once | INTRAVENOUS | Status: AC
  Administered 2018-04-02: 200 mg via INTRAVENOUS
  Filled 2018-04-02: qty 8

## 2018-04-02 NOTE — Progress Notes (Signed)
[place] [phone]   [phone]  OFFICE PROGRESS NOTE  [name], MD [address] [place] [address]  DIAGNOSIS: Metastatic non-small cell carcinoma (T3, N2, M1a) non-small cell lung cancer diagnosed in November 2018 and presented with large right suprahilar and mediastinal mass as well as recent right pleural effusion.  Guardant 360: No actionable mutations.  PRIOR THERAPY:  1) Palliative radiotherapy to the large mediastinal mass under the care of Dr. Lisbeth Renshaw. 2) Systemic chemotherapy with carboplatin for AUC of 5, paclitaxel 175 mg/M2 and Keytruda 200 mg IV every 3 weeks.  First dose expected May 01, 2017.  Status post 3 cycles.  CURRENT THERAPY: Maintenance treatment with immunotherapy with single agent Keytruda 200 mg IV every 3 weeks.  First cycle 07/24/2017.  Status post 15 cycles.  INTERVAL HISTORY: Cassidy Sanders 78 y.o. female returns to the clinic today for follow-up visit accompanied by her daughter.  The patient is feeling fine today with no concerning complaints.  She denied having any chest pain but continues to have the shortness of breath with exertion and she is currently on home oxygen.  She denied having any chest pain, cough or hemoptysis.  She has no nausea, vomiting, diarrhea or constipation.  She denied having any fever or chills.  The patient has no weight loss or night sweats.  She is here today for evaluation before starting cycle #16.  MEDICAL HISTORY: Past Medical History:  Diagnosis Date  . Arrhythmia   . Atrial fibrillation ([place])   . Brain tumor (benign) ([place])   . Depression   . Dyspnea    home oxygen  . Dysrhythmia    a fib  . History of home oxygen therapy 02/2017   2 L/minute  . Hypertension     ALLERGIES:  is allergic to latex; gabapentin; and tape.  MEDICATIONS:  Current Outpatient Medications  Medication Sig Dispense Refill  . albuterol (PROVENTIL) (2.5 MG/3ML) 0.083% nebulizer  solution VVN Q 4 H PRF WHEEZING OR SHORTNESS OF BREATH 360 mL 2  . Biotin 2.5 MG TABS Take 2,500 mcg by mouth daily.    . clobetasol cream (TEMOVATE) 1.88 % Apply 1 application topically 2 (two) times daily. 60 g 0  . diphenhydrAMINE (BENADRYL) 25 mg capsule Take 1 capsule (25 mg total) by mouth at bedtime as needed for sleep. 30 capsule 0  . hydrOXYzine (ATARAX/VISTARIL) 10 MG tablet Take 1 tablet (10 mg total) by mouth 3 (three) times daily as needed for itching. 30 tablet 0  . levalbuterol (XOPENEX) 0.63 MG/3ML nebulizer solution Take 3 mLs (0.63 mg total) by nebulization every 6 (six) hours as needed for wheezing or shortness of breath. 3 mL 12  . levothyroxine (SYNTHROID, LEVOTHROID) 100 MCG tablet TAKE 1 TABLET BY MOUTH EVERY DAY BEFORE BREAKFAST 90 tablet 1  . metoprolol tartrate (LOPRESSOR) 25 MG tablet Take 25 mg by mouth daily.     Marland Kitchen Respiratory Therapy Supplies (FLUTTER) DEVI Use as directed. 1 each 0  . sertraline (ZOLOFT) 50 MG tablet Take 50 mg by mouth daily.    . feeding supplement, ENSURE ENLIVE, (ENSURE ENLIVE) LIQD Take 237 mLs by mouth 2 (two) times daily as needed (If patient or family requests, if pt unable to eat for a meal, or if intakes are poor). (Patient not taking: Reported on 02/20/2018) 237 mL 12  . loperamide (IMODIUM) 2 MG capsule Take 2 mg by mouth as needed for diarrhea or loose stools.    Marland Kitchen  oxyCODONE (OXY IR/ROXICODONE) 5 MG immediate release tablet Take 1 tablet by mouth every 2 (two) hours as needed.   0   No current facility-administered medications for this visit.     SURGICAL HISTORY:  Past Surgical History:  Procedure Laterality Date  . ABDOMINAL HYSTERECTOMY    . BLADDER SURGERY    . BRAIN SURGERY    . ENDOBRONCHIAL ULTRASOUND Bilateral 02/06/2017   Procedure: ENDOBRONCHIAL ULTRASOUND;  Surgeon: Juanito Doom, MD;  Location: [facility];  Service: Cardiopulmonary;  Laterality: Bilateral;  . IR GUIDED DRAIN W CATHETER PLACEMENT  06/29/2017  . IR  REMOVAL OF PLURAL CATH W/CUFF  08/28/2017  . IR US GUIDE BX ASP/DRAIN  06/29/2017  . VIDEO BRONCHOSCOPY WITH ENDOBRONCHIAL ULTRASOUND N/A 03/23/2017   Procedure: VIDEO BRONCHOSCOPY WITH ENDOBRONCHIAL ULTRASOUND;  Surgeon: Juanito Doom, MD;  Location: [facility] OR;  Service: Thoracic;  Laterality: N/A;    REVIEW OF SYSTEMS:  A comprehensive review of systems was negative except for: Respiratory: positive for dyspnea on exertion   PHYSICAL EXAMINATION: General appearance: alert, cooperative and no distress Head: Normocephalic, without obvious abnormality, atraumatic Neck: no adenopathy, no JVD, supple, symmetrical, trachea midline and thyroid not enlarged, symmetric, no tenderness/mass/nodules Lymph nodes: Cervical, supraclavicular, and axillary nodes normal. Resp: clear to auscultation bilaterally Back: symmetric, no curvature. ROM normal. No CVA tenderness. Cardio: regular rate and rhythm, S1, S2 normal, no murmur, click, rub or gallop GI: soft, non-tender; bowel sounds normal; no masses,  no organomegaly Extremities: extremities normal, atraumatic, no cyanosis or edema  ECOG PERFORMANCE STATUS: 1 - Symptomatic but completely ambulatory  Blood pressure 109/66, pulse 97, temperature (!) 97.4 F (36.3 C), temperature source Oral, resp. rate 20, height 5\' 7"  (1.702 m), weight 176 lb 8 oz (80.1 kg), SpO2 96 %.  LABORATORY DATA: Lab Results  Component Value Date   WBC 5.6 04/02/2018   HGB 11.8 (L) 04/02/2018   HCT 37.2 04/02/2018   MCV 90.3 04/02/2018   PLT 171 04/02/2018      Chemistry      Component Value Date/Time   NA 142 03/11/2018 1325   NA 143 03/15/2017 1436   K 3.8 03/11/2018 1325   K 3.7 03/15/2017 1436   CL 104 03/11/2018 1325   CO2 30 03/11/2018 1325   CO2 34 (H) 03/15/2017 1436   BUN 14 03/11/2018 1325   BUN 17.8 03/15/2017 1436   CREATININE 0.73 03/11/2018 1325   CREATININE 0.8 03/15/2017 1436      Component Value Date/Time   CALCIUM 9.3 03/11/2018 1325    CALCIUM 9.6 03/15/2017 1436   ALKPHOS 116 03/11/2018 1325   ALKPHOS 88 03/15/2017 1436   AST 21 03/11/2018 1325   AST 16 03/15/2017 1436   ALT 15 03/11/2018 1325   ALT 15 03/15/2017 1436   BILITOT 0.5 03/11/2018 1325   BILITOT 0.52 03/15/2017 1436       RADIOGRAPHIC STUDIES: No results found.  ASSESSMENT AND PLAN: This is a very pleasant 78 years old white female recently diagnosed with probably stage IV non-small cell lung cancer presented with large right suprahilar lung mass in addition to mediastinal lymphadenopathy and suspicious malignant right pleural effusion.  The patient underwent systemic chemotherapy with carboplatin, paclitaxel and Keytruda status post 3 cycles.  She was tolerating this treatment well with no concerning complaints except for the last cycle when she was admitted to the hospital with C. difficile as well as dehydration and renal insufficiency.   She is currently on treatment with  single agent Ketruda (pembrolizumab) status post 15 cycles.  The patient is feeling fine today with no concerning complaints.  She is tolerating her treatment well. I recommended for her to proceed with cycle #16 today. I will see the patient back for follow-up visit in 3 weeks for evaluation before starting cycle #17. She was advised to call immediately if she has any concerning symptoms in the interval. The patient voices understanding of current disease status and treatment options and is in agreement with the current care plan. All questions were answered. The patient knows to call the clinic with any problems, questions or concerns. We can certainly see the patient much sooner if necessary.  Disclaimer: This note was dictated with voice recognition software. Similar sounding words can inadvertently be transcribed and may not be corrected upon review.

## 2018-04-02 NOTE — Progress Notes (Signed)
Per Dr. Julien Nordmann, no TSH needed for tx today.

## 2018-04-02 NOTE — Telephone Encounter (Signed)
Per 1/21 los the next three treatments were already scheduled.  Patient declined avs and calendar.

## 2018-04-02 NOTE — Patient Instructions (Signed)
[facility] Discharge Instructions for Patients Receiving Chemotherapy  Today you received the following chemotherapy agents :  Keytruda.  To help prevent nausea and vomiting after your treatment, we encourage you to take your nausea medication as prescribed.   If you develop nausea and vomiting that is not controlled by your nausea medication, call the clinic.   BELOW ARE SYMPTOMS THAT SHOULD BE REPORTED IMMEDIATELY:  *FEVER GREATER THAN 100.5 F  *CHILLS WITH OR WITHOUT FEVER  NAUSEA AND VOMITING THAT IS NOT CONTROLLED WITH YOUR NAUSEA MEDICATION  *UNUSUAL SHORTNESS OF BREATH  *UNUSUAL BRUISING OR BLEEDING  TENDERNESS IN MOUTH AND THROAT WITH OR WITHOUT PRESENCE OF ULCERS  *URINARY PROBLEMS  *BOWEL PROBLEMS  UNUSUAL RASH Items with * indicate a potential emergency and should be followed up as soon as possible.  Feel free to call the clinic should you have any questions or concerns. The clinic phone number is [phone].  Please show the CHEMO ALERT CARD at check-in to the Emergency Department and triage nurse.  

## 2018-04-03 ENCOUNTER — Ambulatory Visit (INDEPENDENT_AMBULATORY_CARE_PROVIDER_SITE_OTHER): Payer: Medicare Other | Admitting: Pulmonary Disease

## 2018-04-03 ENCOUNTER — Encounter: Payer: Self-pay | Admitting: Pulmonary Disease

## 2018-04-03 VITALS — BP 144/86 | HR 107 | Ht 67.0 in | Wt 178.0 lb

## 2018-04-03 DIAGNOSIS — J90 Pleural effusion, not elsewhere classified: Secondary | ICD-10-CM

## 2018-04-03 DIAGNOSIS — J9611 Chronic respiratory failure with hypoxia: Secondary | ICD-10-CM | POA: Diagnosis not present

## 2018-04-03 DIAGNOSIS — C3491 Malignant neoplasm of unspecified part of right bronchus or lung: Secondary | ICD-10-CM

## 2018-04-03 NOTE — Progress Notes (Signed)
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 Started on [place]  HPI  Chief Complaint  Patient presents with  . Follow-up    pt doing well.  recently bought a new concentrator with long lasting battery, doing well on this.     Cassidy Sanders says she has been doing well recently.  Unfortunately her husband was hospitalized but he is back home now.  She continues to take K ETR UDA for her non-small cell lung cancer.  She does not feel short of breath when she exerts herself.  She has not had problems with cough or pneumonia since the last visit.  No recent antibiotic use.  She followed with Dr. Earlie Server yesterday.  She recently purchased a new portable oxygen concentrator and she says that it has helped her quite a bit when she walks around.  The battery last about 9-1/2 hours.  Past Medical History:  Diagnosis Date  . Arrhythmia   . Atrial fibrillation ([place])   . Brain tumor (benign) ([place])   . Depression   . Dyspnea    home oxygen  . Dysrhythmia    a fib  . History of home oxygen therapy 02/2017   2 L/minute  . Hypertension      Review of Systems  Constitutional: Positive for malaise/fatigue.  HENT: Negative for congestion and ear discharge.   Respiratory: Positive for shortness of breath. Negative for cough, sputum production, wheezing and stridor.   Cardiovascular: Negative for claudication and leg swelling.  Neurological: Positive for weakness.      Objective:  Physical Exam   Vitals:   04/03/18 1427  BP: (!) 144/86  Pulse: (!) 107  SpO2: 92%  Weight: 178 lb (80.7 kg)  Height: 5\' 7"  (1.702 m)   2L [place]  Gen: well appearing HENT: OP clear, neck supple PULM: Diminished R base B, normal percussion CV: RRR, no mgr, trace edema GI: BS+, soft, nontender Derm: no cyanosis or rash Psyche: normal mood and affect   CBC    Component  Value Date/Time   WBC 5.6 04/02/2018 1321   WBC 7.1 08/28/2017 1239   RBC 4.12 04/02/2018 1321   HGB 11.8 (L) 04/02/2018 1321   HGB 11.8 03/15/2017 1436   HCT 37.2 04/02/2018 1321   HCT 37.8 03/15/2017 1436   PLT 171 04/02/2018 1321   PLT 215 03/15/2017 1436   MCV 90.3 04/02/2018 1321   MCV 92.4 03/15/2017 1436   MCH 28.6 04/02/2018 1321   MCHC 31.7 04/02/2018 1321   RDW 13.4 04/02/2018 1321   RDW 15.3 (H) 03/15/2017 1436   LYMPHSABS 1.1 04/02/2018 1321   LYMPHSABS 1.8 03/15/2017 1436   MONOABS 0.6 04/02/2018 1321   MONOABS 0.7 03/15/2017 1436   EOSABS 0.1 04/02/2018 1321   EOSABS 0.1 03/15/2017 1436   BASOSABS 0.0 04/02/2018 1321   BASOSABS 0.0 03/15/2017 1436     Chest imaging: Multiple chest x-ray images reviewed.  Specifically the one from today shows that there is improved aeration in the right upper lobe near the mass but I worry that there is an increased pleural effusion.  Radiology says that the pleural effusion is stable as is the pleural thickening. 11/2017 CT chest images reviewed personally: there is a large loculated effusion on the right which has not changed in appearance compared to the 09/2017 CT chest, mild lingular radiation fibrosis, otherwise  normal pulmonary parenchyma on left December 2019 CT chest images personally reviewed showing improved aeration in the right lung, less airspace disease compared to the September study, slightly decreased pleural effusion but some pleural fluid still present.  PFT:  Labs:  Path:  Echo:  Heart Catheterization:  Records from her visit yesterday with Dr. Earlie Server reviewed where she was seen for her stage IV non-small cell lung cancer and continues on a medicine called [place].  She was stable at that visit and they recommended that she continue on to cycle 17 today.     Assessment & Plan:   Pleural effusion on right  Stage IV squamous cell carcinoma of right lung (HCC)  Chronic respiratory failure with hypoxia  (HCC)  Discussion: This has been a stable interval for Cassidy Sanders and she is actually much better than when I last saw her.  The results from her December 2019 CT scan are quite striking compared to the September 2019 CT scan strongly suggesting that she has had a robust response to her current cancer treatment.  Plan: Right sided pleural effusion: At this time there is no indication for further thoracentesis If you have worsening shortness of breath please let us know  Chronic respiratory failure with hypoxemia: Keep using oxygen continuously when you exert yourself  Stage IV non-small cell lung cancer: As described today I am quite impressed with how well you are doing, continue follow-up with Dr. Earlie Server  We will plan on seeing you back in 6 months or sooner if needed    Current Outpatient Medications:  .  albuterol (PROVENTIL) (2.5 MG/3ML) 0.083% nebulizer solution, VVN Q 4 H PRF WHEEZING OR SHORTNESS OF BREATH, Disp: 360 mL, Rfl: 2 .  Biotin 2.5 MG TABS, Take 2,500 mcg by mouth daily., Disp: , Rfl:  .  clobetasol cream (TEMOVATE) 7.82 %, Apply 1 application topically 2 (two) times daily., Disp: 60 g, Rfl: 0 .  diphenhydrAMINE (BENADRYL) 25 mg capsule, Take 1 capsule (25 mg total) by mouth at bedtime as needed for sleep., Disp: 30 capsule, Rfl: 0 .  feeding supplement, ENSURE ENLIVE, (ENSURE ENLIVE) LIQD, Take 237 mLs by mouth 2 (two) times daily as needed (If patient or family requests, if pt unable to eat for a meal, or if intakes are poor)., Disp: 237 mL, Rfl: 12 .  hydrOXYzine (ATARAX/VISTARIL) 10 MG tablet, Take 1 tablet (10 mg total) by mouth 3 (three) times daily as needed for itching., Disp: 30 tablet, Rfl: 0 .  levalbuterol (XOPENEX) 0.63 MG/3ML nebulizer solution, Take 3 mLs (0.63 mg total) by nebulization every 6 (six) hours as needed for wheezing or shortness of breath., Disp: 3 mL, Rfl: 12 .  levothyroxine (SYNTHROID, LEVOTHROID) 100 MCG tablet, TAKE 1 TABLET BY MOUTH EVERY  DAY BEFORE BREAKFAST, Disp: 90 tablet, Rfl: 1 .  loperamide (IMODIUM) 2 MG capsule, Take 2 mg by mouth as needed for diarrhea or loose stools., Disp: , Rfl:  .  metoprolol tartrate (LOPRESSOR) 25 MG tablet, Take 25 mg by mouth daily. , Disp: , Rfl:  .  oxyCODONE (OXY IR/ROXICODONE) 5 MG immediate release tablet, Take 1 tablet by mouth every 2 (two) hours as needed. , Disp: , Rfl: 0 .  Respiratory Therapy Supplies (FLUTTER) DEVI, Use as directed., Disp: 1 each, Rfl: 0 .  sertraline (ZOLOFT) 50 MG tablet, Take 50 mg by mouth daily., Disp: , Rfl:

## 2018-04-03 NOTE — Patient Instructions (Signed)
Right sided pleural effusion: At this time there is no indication for further thoracentesis If you have worsening shortness of breath please let us know  Chronic respiratory failure with hypoxemia: Keep using oxygen continuously when you exert yourself  Stage IV non-small cell lung cancer: As described today I am quite impressed with how well you are doing, continue follow-up with Dr. Earlie Server  We will plan on seeing you back in 6 months or sooner if needed

## 2018-04-19 ENCOUNTER — Telehealth: Payer: Self-pay | Admitting: Internal Medicine

## 2018-04-19 NOTE — Telephone Encounter (Signed)
MM PAL 2/11 - moved appointments to 2/12 w/[facility]. Spoke with patient - patient not able to come in the AM. F/u moved to [place] in the PM. Patient has new date/time.

## 2018-04-23 ENCOUNTER — Other Ambulatory Visit: Payer: Medicare Other

## 2018-04-23 ENCOUNTER — Ambulatory Visit: Payer: Medicare Other

## 2018-04-23 ENCOUNTER — Ambulatory Visit: Payer: Medicare Other | Admitting: Internal Medicine

## 2018-04-24 ENCOUNTER — Inpatient Hospital Stay: Payer: Medicare Other

## 2018-04-24 ENCOUNTER — Inpatient Hospital Stay: Payer: Medicare Other | Attending: Internal Medicine | Admitting: Medical

## 2018-04-24 ENCOUNTER — Ambulatory Visit: Payer: Medicare Other

## 2018-04-24 ENCOUNTER — Other Ambulatory Visit: Payer: Medicare Other

## 2018-04-24 VITALS — BP 150/84 | HR 95 | Temp 98.1°F | Resp 17 | Ht 67.0 in | Wt 176.6 lb

## 2018-04-24 DIAGNOSIS — Z5112 Encounter for antineoplastic immunotherapy: Secondary | ICD-10-CM | POA: Insufficient documentation

## 2018-04-24 DIAGNOSIS — C382 Malignant neoplasm of posterior mediastinum: Secondary | ICD-10-CM

## 2018-04-24 DIAGNOSIS — Z87891 Personal history of nicotine dependence: Secondary | ICD-10-CM | POA: Insufficient documentation

## 2018-04-24 DIAGNOSIS — L299 Pruritus, unspecified: Secondary | ICD-10-CM | POA: Diagnosis not present

## 2018-04-24 DIAGNOSIS — C3491 Malignant neoplasm of unspecified part of right bronchus or lung: Secondary | ICD-10-CM

## 2018-04-24 LAB — CMP (CANCER CENTER ONLY)
ALK PHOS: 121 U/L (ref 38–126)
ALT: 12 U/L (ref 0–44)
AST: 18 U/L (ref 15–41)
Albumin: 3 g/dL — ABNORMAL LOW (ref 3.5–5.0)
Anion gap: 8 (ref 5–15)
BUN: 15 mg/dL (ref 8–23)
CO2: 31 mmol/L (ref 22–32)
Calcium: 9.4 mg/dL (ref 8.9–10.3)
Chloride: 102 mmol/L (ref 98–111)
Creatinine: 0.76 mg/dL (ref 0.44–1.00)
GFR, Est AFR Am: >60 mL/min (ref >60)
GFR, Estimated: >60 mL/min (ref >60)
GLUCOSE: 103 mg/dL — AB (ref 70–99)
Potassium: 3.8 mmol/L (ref 3.5–5.1)
Sodium: 141 mmol/L (ref 135–145)
Total Bilirubin: 0.6 mg/dL (ref 0.3–1.2)
Total Protein: 7.5 g/dL (ref 6.5–8.1)

## 2018-04-24 LAB — CBC WITH DIFFERENTIAL (CANCER CENTER ONLY)
Abs Immature Granulocytes: 0.01 10*3/uL (ref 0.00–0.07)
BASOS PCT: 1 %
Basophils Absolute: 0 10*3/uL (ref 0.0–0.1)
EOS PCT: 2 %
Eosinophils Absolute: 0.1 10*3/uL (ref 0.0–0.5)
HCT: 38 % (ref 36.0–46.0)
Hemoglobin: 12 g/dL (ref 12.0–15.0)
Immature Granulocytes: 0 %
Lymphocytes Relative: 14 %
Lymphs Abs: 0.8 10*3/uL (ref 0.7–4.0)
MCH: 28 pg (ref 26.0–34.0)
MCHC: 31.6 g/dL (ref 30.0–36.0)
MCV: 88.8 fL (ref 80.0–100.0)
MONOS PCT: 9 %
Monocytes Absolute: 0.6 10*3/uL (ref 0.1–1.0)
NEUTROS PCT: 74 %
NRBC: 0 % (ref 0.0–0.2)
Neutro Abs: 4.6 10*3/uL (ref 1.7–7.7)
Platelet Count: 221 10*3/uL (ref 150–400)
RBC: 4.28 MIL/uL (ref 3.87–5.11)
RDW: 13.2 % (ref 11.5–15.5)
WBC Count: 6.1 10*3/uL (ref 4.0–10.5)

## 2018-04-24 MED ORDER — SODIUM CHLORIDE 0.9 % IV SOLN
200.0000 mg | Freq: Once | INTRAVENOUS | Status: AC
  Administered 2018-04-24: 200 mg via INTRAVENOUS
  Filled 2018-04-24: qty 8

## 2018-04-24 MED ORDER — SODIUM CHLORIDE 0.9 % IV SOLN
Freq: Once | INTRAVENOUS | Status: AC
  Administered 2018-04-24: 14:00:00 via INTRAVENOUS
  Filled 2018-04-24: qty 250

## 2018-04-24 NOTE — Patient Instructions (Signed)
[facility] Discharge Instructions for Patients Receiving Chemotherapy  Today you received the following chemotherapy agents Pembrolizumab [facility]).  To help prevent nausea and vomiting after your treatment, we encourage you to take your nausea medication as prescribed.   If you develop nausea and vomiting that is not controlled by your nausea medication, call the clinic.   BELOW ARE SYMPTOMS THAT SHOULD BE REPORTED IMMEDIATELY:  *FEVER GREATER THAN 100.5 F  *CHILLS WITH OR WITHOUT FEVER  NAUSEA AND VOMITING THAT IS NOT CONTROLLED WITH YOUR NAUSEA MEDICATION  *UNUSUAL SHORTNESS OF BREATH  *UNUSUAL BRUISING OR BLEEDING  TENDERNESS IN MOUTH AND THROAT WITH OR WITHOUT PRESENCE OF ULCERS  *URINARY PROBLEMS  *BOWEL PROBLEMS  UNUSUAL RASH Items with * indicate a potential emergency and should be followed up as soon as possible.  Feel free to call the clinic should you have any questions or concerns. The clinic phone number is [phone].  Please show the [place] at check-in to the Emergency Department and triage nurse.

## 2018-04-24 NOTE — Progress Notes (Signed)
Pt seen by PA Van only, no RN assessment at this time.  PA aware. 

## 2018-04-25 MED ORDER — HYDROXYZINE HCL 10 MG PO TABS: 10.0000 mg | ORAL_TABLET | Freq: Three times a day (TID) | ORAL | 1 refills | Status: DC | PRN

## 2018-04-25 NOTE — Progress Notes (Signed)
[facility] Progress Note   Cassidy Sanders [id number] Nov 27, 1940 78 y.o.  Cassidy Sanders is managed by Dr. Fanny Bien. Mohamed  Actively treated with chemotherapy/immunotherapy/hormonal therapy: yes  Current Therapy: Keytruda  Last Treated: 04/02/2018 (cycle 16, day 1)  Next scheduled appointment: 05/14/2018  Assessment: Plan:    Stage IV squamous cell carcinoma of right lung (HCC)  Severe itching - Plan: hydrOXYzine (ATARAX/VISTARIL) 10 MG tablet   Stage IV squamous cell carcinoma of the right lung: [name] presents to the office today for consideration of cycle 17, day 1 of [place].  She continues to do well overall with no acute issues of concern.  We will proceed with her treatment today.  She is scheduled to return to see Dr. Julien Nordmann on 03 05/2018.  Ongoing severe pruritus: The patient was given a refill of Atarax 10 mg p.o. 3 times daily as needed.  She was also instructed to continue using topical hydrocortisone cream.  She was told to begin a trial of Selsun Blue shampoo for the scaling and itching of her scalp.  Please see After Visit Summary for patient specific instructions.  Future Appointments  Date Time Provider [place]  05/14/2018  1:00 PM [facility] LAB 4 [facility] None  05/14/2018  1:30 PM Curt Bears, MD [facility] None  05/14/2018  2:00 PM [facility] INFUSION [facility] None  05/28/2018 11:00 AM Josue Hector, MD [facility]  06/04/2018  1:15 PM [facility] LAB 5 [facility] None  06/04/2018  1:45 PM Curt Bears, MD [facility] None  06/04/2018  2:30 PM [facility] INFUSION [facility] None    No orders of the defined types were placed in this encounter.      Subjective:   Patient ID:  [name] is a 78 y.o. (DOB [birth year]) female.  Chief Complaint: No chief complaint on file.   HPI Cassidy Sanders   is a very delightful 78 year old female with a history of a stage IV squamous  cell carcinoma of the lung who is managed by Dr. Julien Nordmann and who presents to the clinic today for consideration of cycle 17, day 1 of Keytruda.  She presents to the clinic today with her daughter.  She reports that she is doing well overall and is tolerating her treatment without adverse side effects.  She does have ongoing scaling and pruritus of her trunk, upper extremities, and scalp.  She has been using topical hydrocortisone cream and has had a prescription for Atarax which provides her some relief.  She request a refill today.  Medications: I have reviewed the patient's current medications.  Allergies:  Allergies  Allergen Reactions  . Latex Dermatitis  . Gabapentin Other (See Comments)    Excessive sleep   . Tape Rash    Past Medical History:  Diagnosis Date  . Arrhythmia   . Atrial fibrillation ([place])   . Brain tumor (benign) ([place])   . Depression   . Dyspnea    home oxygen  . Dysrhythmia    a fib  . History of home oxygen therapy 02/2017   2 L/minute  . Hypertension     Past Surgical History:  Procedure Laterality Date  . ABDOMINAL HYSTERECTOMY    . BLADDER SURGERY    . BRAIN SURGERY    . ENDOBRONCHIAL ULTRASOUND Bilateral 02/06/2017   Procedure: ENDOBRONCHIAL ULTRASOUND;  Surgeon: Juanito Doom, MD;  Location: [facility];  Service: Cardiopulmonary;  Laterality: Bilateral;  . IR GUIDED DRAIN W CATHETER PLACEMENT  06/29/2017  . IR REMOVAL OF  PLURAL CATH W/CUFF  08/28/2017  . IR US GUIDE BX ASP/DRAIN  06/29/2017  . VIDEO BRONCHOSCOPY WITH ENDOBRONCHIAL ULTRASOUND N/A 03/23/2017   Procedure: VIDEO BRONCHOSCOPY WITH ENDOBRONCHIAL ULTRASOUND;  Surgeon: Juanito Doom, MD;  Location: [facility];  Service: Thoracic;  Laterality: N/A;    Family History  Family history unknown: Yes    Social History   Socioeconomic History  . Marital status: Married    Spouse name: Not on file  . Number of children: Not on file  . Years of education: Not on file  . Highest  education level: Not on file  Occupational History  . Not on file  Social Needs  . Financial resource strain: Not on file  . Food insecurity:    Worry: Not on file    Inability: Not on file  . Transportation needs:    Medical: Not on file    Non-medical: Not on file  Tobacco Use  . Smoking status: Former Smoker    Packs/day: 1.00    Years: 59.00    Pack years: 59.00    Types: Cigarettes    Last attempt to quit: 06/11/2012    Years since quitting: 5.8  . Smokeless tobacco: Never Used  Substance and Sexual Activity  . Alcohol use: Yes    Comment: Weekly   . Drug use: No  . Sexual activity: Not on file  Lifestyle  . Physical activity:    Days per week: Not on file    Minutes per session: Not on file  . Stress: Not on file  Relationships  . Social connections:    Talks on phone: Not on file    Gets together: Not on file    Attends religious service: Not on file    Active member of club or organization: Not on file    Attends meetings of clubs or organizations: Not on file    Relationship status: Not on file  . Intimate partner violence:    Fear of current or ex partner: Not on file    Emotionally abused: Not on file    Physically abused: Not on file    Forced sexual activity: Not on file  Other Topics Concern  . Not on file  Social History Narrative  . Not on file    Past Medical History, Surgical history, Social history, and Family history were reviewed and updated as appropriate.   Please see review of systems for further details on the patient's review from today.   Review of Systems:  Review of Systems  Constitutional: Negative for chills, diaphoresis and fever.  HENT: Negative for facial swelling and trouble swallowing.   Respiratory: Negative for cough, chest tightness and shortness of breath.   Cardiovascular: Negative for chest pain.  Gastrointestinal: Negative for constipation, diarrhea, nausea and vomiting.  Skin: Negative for rash.       Pruritus and  scaling of the skin and scalp    Objective:   Physical Exam:  BP (!) 150/84 (BP Location: Left Arm, Patient Position: Sitting) Comment: [name] was notified  Pulse 95   Temp 98.1 F (36.7 C) (Oral)   Resp 17   Ht 5\' 7"  (1.702 m)   Wt 176 lb 9.6 oz (80.1 kg)   SpO2 94%   BMI 27.66 kg/m  ECOG: 0  Physical Exam Constitutional:      General: She is not in acute distress.    Appearance: Normal appearance. She is not diaphoretic.  HENT:  Head: Normocephalic and atraumatic.  Cardiovascular:     Rate and Rhythm: Normal rate and regular rhythm.     Heart sounds: Normal heart sounds. No murmur. No friction rub. No gallop.   Pulmonary:     Effort: Pulmonary effort is normal. No respiratory distress.     Breath sounds: No wheezing or rales.     Comments: Breath sounds slightly decreased but clear throughout all lung fields. Skin:    General: Skin is warm and dry.     Findings: No erythema.     Comments: Multiple areas of flat scaling lesions over the bilateral upper extremities with slightly raised scaling lesions over the scalp.  Neurological:     Mental Status: She is alert.     Gait: Gait normal.  Psychiatric:        Mood and Affect: Mood normal.        Behavior: Behavior normal.        Thought Content: Thought content normal.        Judgment: Judgment normal.     Lab Review:     Component Value Date/Time   NA 141 04/24/2018 1310   NA 143 03/15/2017 1436   K 3.8 04/24/2018 1310   K 3.7 03/15/2017 1436   CL 102 04/24/2018 1310   CO2 31 04/24/2018 1310   CO2 34 (H) 03/15/2017 1436   GLUCOSE 103 (H) 04/24/2018 1310   GLUCOSE 92 03/15/2017 1436   BUN 15 04/24/2018 1310   BUN 17.8 03/15/2017 1436   CREATININE 0.76 04/24/2018 1310   CREATININE 0.8 03/15/2017 1436   CALCIUM 9.4 04/24/2018 1310   CALCIUM 9.6 03/15/2017 1436   PROT 7.5 04/24/2018 1310   PROT 6.9 03/15/2017 1436   ALBUMIN 3.0 (L) 04/24/2018 1310   ALBUMIN 3.3 (L) 03/15/2017 1436   AST 18 04/24/2018  1310   AST 16 03/15/2017 1436   ALT 12 04/24/2018 1310   ALT 15 03/15/2017 1436   ALKPHOS 121 04/24/2018 1310   ALKPHOS 88 03/15/2017 1436   BILITOT 0.6 04/24/2018 1310   BILITOT 0.52 03/15/2017 1436   GFRNONAA >60 04/24/2018 1310   GFRAA >60 04/24/2018 1310       Component Value Date/Time   WBC 6.1 04/24/2018 1310   WBC 7.1 08/28/2017 1239   RBC 4.28 04/24/2018 1310   HGB 12.0 04/24/2018 1310   HGB 11.8 03/15/2017 1436   HCT 38.0 04/24/2018 1310   HCT 37.8 03/15/2017 1436   PLT 221 04/24/2018 1310   PLT 215 03/15/2017 1436   MCV 88.8 04/24/2018 1310   MCV 92.4 03/15/2017 1436   MCH 28.0 04/24/2018 1310   MCHC 31.6 04/24/2018 1310   RDW 13.2 04/24/2018 1310   RDW 15.3 (H) 03/15/2017 1436   LYMPHSABS 0.8 04/24/2018 1310   LYMPHSABS 1.8 03/15/2017 1436   MONOABS 0.6 04/24/2018 1310   MONOABS 0.7 03/15/2017 1436   EOSABS 0.1 04/24/2018 1310   EOSABS 0.1 03/15/2017 1436   BASOSABS 0.0 04/24/2018 1310   BASOSABS 0.0 03/15/2017 1436   -------------------------------  Imaging from last 24 hours (if applicable):  Radiology interpretation: No results found.

## 2018-04-29 DIAGNOSIS — L820 Inflamed seborrheic keratosis: Secondary | ICD-10-CM | POA: Diagnosis not present

## 2018-04-29 DIAGNOSIS — L570 Actinic keratosis: Secondary | ICD-10-CM | POA: Diagnosis not present

## 2018-04-29 DIAGNOSIS — I781 Nevus, non-neoplastic: Secondary | ICD-10-CM | POA: Diagnosis not present

## 2018-04-29 DIAGNOSIS — D1801 Hemangioma of skin and subcutaneous tissue: Secondary | ICD-10-CM | POA: Diagnosis not present

## 2018-05-14 ENCOUNTER — Inpatient Hospital Stay: Payer: Medicare Other

## 2018-05-14 ENCOUNTER — Telehealth: Payer: Self-pay | Admitting: Internal Medicine

## 2018-05-14 ENCOUNTER — Other Ambulatory Visit: Payer: Self-pay

## 2018-05-14 ENCOUNTER — Inpatient Hospital Stay: Payer: Medicare Other | Attending: Internal Medicine

## 2018-05-14 ENCOUNTER — Inpatient Hospital Stay (HOSPITAL_BASED_OUTPATIENT_CLINIC_OR_DEPARTMENT_OTHER): Payer: Medicare Other | Admitting: Internal Medicine

## 2018-05-14 ENCOUNTER — Encounter: Payer: Self-pay | Admitting: Internal Medicine

## 2018-05-14 VITALS — BP 113/74 | HR 89 | Temp 98.3°F | Resp 18 | Ht 67.0 in | Wt 173.9 lb

## 2018-05-14 DIAGNOSIS — I10 Essential (primary) hypertension: Secondary | ICD-10-CM

## 2018-05-14 DIAGNOSIS — R0602 Shortness of breath: Secondary | ICD-10-CM

## 2018-05-14 DIAGNOSIS — C382 Malignant neoplasm of posterior mediastinum: Secondary | ICD-10-CM

## 2018-05-14 DIAGNOSIS — Z79899 Other long term (current) drug therapy: Secondary | ICD-10-CM | POA: Diagnosis not present

## 2018-05-14 DIAGNOSIS — Z5112 Encounter for antineoplastic immunotherapy: Secondary | ICD-10-CM | POA: Insufficient documentation

## 2018-05-14 DIAGNOSIS — Z9981 Dependence on supplemental oxygen: Secondary | ICD-10-CM | POA: Diagnosis not present

## 2018-05-14 DIAGNOSIS — C3490 Malignant neoplasm of unspecified part of unspecified bronchus or lung: Secondary | ICD-10-CM

## 2018-05-14 DIAGNOSIS — C3491 Malignant neoplasm of unspecified part of right bronchus or lung: Secondary | ICD-10-CM | POA: Diagnosis not present

## 2018-05-14 LAB — CMP (CANCER CENTER ONLY)
ALT: 14 U/L (ref 0–44)
AST: 19 U/L (ref 15–41)
Albumin: 3 g/dL — ABNORMAL LOW (ref 3.5–5.0)
Alkaline Phosphatase: 133 U/L — ABNORMAL HIGH (ref 38–126)
Anion gap: 10 (ref 5–15)
BUN: 18 mg/dL (ref 8–23)
CO2: 27 mmol/L (ref 22–32)
Calcium: 9.1 mg/dL (ref 8.9–10.3)
Chloride: 105 mmol/L (ref 98–111)
Creatinine: 0.86 mg/dL (ref 0.44–1.00)
GFR, Est AFR Am: >60 mL/min (ref >60)
GFR, Estimated: >60 mL/min (ref >60)
Glucose, Bld: 102 mg/dL — ABNORMAL HIGH (ref 70–99)
POTASSIUM: 3.9 mmol/L (ref 3.5–5.1)
Sodium: 142 mmol/L (ref 135–145)
Total Bilirubin: 0.5 mg/dL (ref 0.3–1.2)
Total Protein: 7.5 g/dL (ref 6.5–8.1)

## 2018-05-14 LAB — CBC WITH DIFFERENTIAL (CANCER CENTER ONLY)
Abs Immature Granulocytes: 0.02 10*3/uL (ref 0.00–0.07)
Basophils Absolute: 0 10*3/uL (ref 0.0–0.1)
Basophils Relative: 1 %
Eosinophils Absolute: 0.1 10*3/uL (ref 0.0–0.5)
Eosinophils Relative: 2 %
HEMATOCRIT: 38.1 % (ref 36.0–46.0)
Hemoglobin: 12.3 g/dL (ref 12.0–15.0)
Immature Granulocytes: 0 %
Lymphocytes Relative: 19 %
Lymphs Abs: 1.1 10*3/uL (ref 0.7–4.0)
MCH: 28.3 pg (ref 26.0–34.0)
MCHC: 32.3 g/dL (ref 30.0–36.0)
MCV: 87.8 fL (ref 80.0–100.0)
Monocytes Absolute: 0.6 10*3/uL (ref 0.1–1.0)
Monocytes Relative: 10 %
NRBC: 0 % (ref 0.0–0.2)
Neutro Abs: 4 10*3/uL (ref 1.7–7.7)
Neutrophils Relative %: 68 %
Platelet Count: 226 10*3/uL (ref 150–400)
RBC: 4.34 MIL/uL (ref 3.87–5.11)
RDW: 13.1 % (ref 11.5–15.5)
WBC Count: 5.9 10*3/uL (ref 4.0–10.5)

## 2018-05-14 LAB — TSH: TSH: 7.206 u[IU]/mL — ABNORMAL HIGH (ref 0.308–3.960)

## 2018-05-14 MED ORDER — SODIUM CHLORIDE 0.9 % IV SOLN
Freq: Once | INTRAVENOUS | Status: AC
  Administered 2018-05-14: 14:00:00 via INTRAVENOUS
  Filled 2018-05-14: qty 250

## 2018-05-14 MED ORDER — SODIUM CHLORIDE 0.9 % IV SOLN
200.0000 mg | Freq: Once | INTRAVENOUS | Status: AC
  Administered 2018-05-14: 200 mg via INTRAVENOUS
  Filled 2018-05-14: qty 8

## 2018-05-14 NOTE — Progress Notes (Signed)
[place] [phone]   [phone]  OFFICE PROGRESS NOTE  [name], MD [address] [place] [address]  DIAGNOSIS: Metastatic non-small cell carcinoma (T3, N2, M1a) non-small cell lung cancer diagnosed in November 2018 and presented with large right suprahilar and mediastinal mass as well as recent right pleural effusion.  Guardant 360: No actionable mutations.  PRIOR THERAPY:  1) Palliative radiotherapy to the large mediastinal mass under the care of Dr. Lisbeth Renshaw. 2) Systemic chemotherapy with carboplatin for AUC of 5, paclitaxel 175 mg/M2 and Keytruda 200 mg IV every 3 weeks.  First dose expected May 01, 2017.  Status post 3 cycles.  CURRENT THERAPY: Maintenance treatment with immunotherapy with single agent Keytruda 200 mg IV every 3 weeks.  First cycle 07/24/2017.  Status post 17 cycles.  INTERVAL HISTORY: Cassidy Sanders 78 y.o. female returns to the clinic today for follow-up visit accompanied by her daughter [name].  The patient is feeling fine today with no concerning complaints except for her baseline shortness of breath and she is currently on home oxygen.  She denied having any chest pain, cough or hemoptysis.  She denied having any nausea, vomiting, diarrhea or constipation.  She has no skin rash but continues to have dry skin and itching.  She denied having any significant weight loss or night sweats.  She has been tolerating her treatment with Keytruda fairly well.  She is here today for evaluation before starting cycle #18 of her treatment.  MEDICAL HISTORY: Past Medical History:  Diagnosis Date  . Arrhythmia   . Atrial fibrillation ([place])   . Brain tumor (benign) ([place])   . Depression   . Dyspnea    home oxygen  . Dysrhythmia    a fib  . History of home oxygen therapy 02/2017   2 L/minute  . Hypertension     ALLERGIES:  is allergic to latex; gabapentin; and tape.  MEDICATIONS:  Current Outpatient  Medications  Medication Sig Dispense Refill  . albuterol (PROVENTIL) (2.5 MG/3ML) 0.083% nebulizer solution VVN Q 4 H PRF WHEEZING OR SHORTNESS OF BREATH 360 mL 2  . Biotin 2.5 MG TABS Take 2,500 mcg by mouth daily.    . clobetasol cream (TEMOVATE) 1.75 % Apply 1 application topically 2 (two) times daily. 60 g 0  . diphenhydrAMINE (BENADRYL) 25 mg capsule Take 1 capsule (25 mg total) by mouth at bedtime as needed for sleep. 30 capsule 0  . feeding supplement, ENSURE ENLIVE, (ENSURE ENLIVE) LIQD Take 237 mLs by mouth 2 (two) times daily as needed (If patient or family requests, if pt unable to eat for a meal, or if intakes are poor). 237 mL 12  . hydrOXYzine (ATARAX/VISTARIL) 10 MG tablet Take 1 tablet (10 mg total) by mouth 3 (three) times daily as needed for itching. 30 tablet 1  . levalbuterol (XOPENEX) 0.63 MG/3ML nebulizer solution Take 3 mLs (0.63 mg total) by nebulization every 6 (six) hours as needed for wheezing or shortness of breath. 3 mL 12  . levothyroxine (SYNTHROID, LEVOTHROID) 100 MCG tablet TAKE 1 TABLET BY MOUTH EVERY DAY BEFORE BREAKFAST 90 tablet 1  . loperamide (IMODIUM) 2 MG capsule Take 2 mg by mouth as needed for diarrhea or loose stools.    . metoprolol tartrate (LOPRESSOR) 25 MG tablet Take 25 mg by mouth daily.     Marland Kitchen oxyCODONE (OXY IR/ROXICODONE) 5 MG immediate release tablet Take 1 tablet by mouth every 2 (two) hours as needed.  0  . Respiratory Therapy Supplies (FLUTTER) DEVI Use as directed. 1 each 0  . sertraline (ZOLOFT) 50 MG tablet Take 50 mg by mouth daily.     No current facility-administered medications for this visit.     SURGICAL HISTORY:  Past Surgical History:  Procedure Laterality Date  . ABDOMINAL HYSTERECTOMY    . BLADDER SURGERY    . BRAIN SURGERY    . ENDOBRONCHIAL ULTRASOUND Bilateral 02/06/2017   Procedure: ENDOBRONCHIAL ULTRASOUND;  Surgeon: Juanito Doom, MD;  Location: [facility];  Service: Cardiopulmonary;  Laterality: Bilateral;    . IR GUIDED DRAIN W CATHETER PLACEMENT  06/29/2017  . IR REMOVAL OF PLURAL CATH W/CUFF  08/28/2017  . IR US GUIDE BX ASP/DRAIN  06/29/2017  . VIDEO BRONCHOSCOPY WITH ENDOBRONCHIAL ULTRASOUND N/A 03/23/2017   Procedure: VIDEO BRONCHOSCOPY WITH ENDOBRONCHIAL ULTRASOUND;  Surgeon: Juanito Doom, MD;  Location: [facility];  Service: Thoracic;  Laterality: N/A;    REVIEW OF SYSTEMS:  A comprehensive review of systems was negative except for: Respiratory: positive for dyspnea on exertion   PHYSICAL EXAMINATION: General appearance: alert, cooperative and no distress Head: Normocephalic, without obvious abnormality, atraumatic Neck: no adenopathy, no JVD, supple, symmetrical, trachea midline and thyroid not enlarged, symmetric, no tenderness/mass/nodules Lymph nodes: Cervical, supraclavicular, and axillary nodes normal. Resp: clear to auscultation bilaterally Back: symmetric, no curvature. ROM normal. No CVA tenderness. Cardio: regular rate and rhythm, S1, S2 normal, no murmur, click, rub or gallop GI: soft, non-tender; bowel sounds normal; no masses,  no organomegaly Extremities: extremities normal, atraumatic, no cyanosis or edema  ECOG PERFORMANCE STATUS: 1 - Symptomatic but completely ambulatory  Blood pressure 113/74, pulse 89, temperature 98.3 F (36.8 C), temperature source Oral, resp. rate 18, height 5\' 7"  (1.702 m), weight 173 lb 14.4 oz (78.9 kg), SpO2 95 %.  LABORATORY DATA: Lab Results  Component Value Date   WBC 5.9 05/14/2018   HGB 12.3 05/14/2018   HCT 38.1 05/14/2018   MCV 87.8 05/14/2018   PLT 226 05/14/2018      Chemistry      Component Value Date/Time   NA 142 05/14/2018 1259   NA 143 03/15/2017 1436   K 3.9 05/14/2018 1259   K 3.7 03/15/2017 1436   CL 105 05/14/2018 1259   CO2 27 05/14/2018 1259   CO2 34 (H) 03/15/2017 1436   BUN 18 05/14/2018 1259   BUN 17.8 03/15/2017 1436   CREATININE 0.86 05/14/2018 1259   CREATININE 0.8 03/15/2017 1436      Component  Value Date/Time   CALCIUM 9.1 05/14/2018 1259   CALCIUM 9.6 03/15/2017 1436   ALKPHOS 133 (H) 05/14/2018 1259   ALKPHOS 88 03/15/2017 1436   AST 19 05/14/2018 1259   AST 16 03/15/2017 1436   ALT 14 05/14/2018 1259   ALT 15 03/15/2017 1436   BILITOT 0.5 05/14/2018 1259   BILITOT 0.52 03/15/2017 1436       RADIOGRAPHIC STUDIES: No results found.  ASSESSMENT AND PLAN: This is a very pleasant 78 years old white female recently diagnosed with probably stage IV non-small cell lung cancer presented with large right suprahilar lung mass in addition to mediastinal lymphadenopathy and suspicious malignant right pleural effusion.  The patient underwent systemic chemotherapy with carboplatin, paclitaxel and Keytruda status post 3 cycles.  She was tolerating this treatment well with no concerning complaints except for the last cycle when she was admitted to the hospital with C. difficile as well as dehydration and renal insufficiency.  She is currently on treatment with single agent Ketruda (pembrolizumab) status post 17 cycles.  The patient has been tolerating this treatment well with no concerning adverse effects. I recommended for her to proceed with cycle #18 today as scheduled. I will see her back for follow-up visit in 3 weeks for evaluation with repeat CT scan of the chest, abdomen and pelvis for restaging of her disease. The patient was advised to call immediately if she has any concerning symptoms in the interval. The patient voices understanding of current disease status and treatment options and is in agreement with the current care plan. All questions were answered. The patient knows to call the clinic with any problems, questions or concerns. We can certainly see the patient much sooner if necessary.  Disclaimer: This note was dictated with voice recognition software. Similar sounding words can inadvertently be transcribed and may not be corrected upon review.

## 2018-05-14 NOTE — Patient Instructions (Signed)
[facility] Discharge Instructions for Patients Receiving Chemotherapy  Today you received the following chemotherapy agents:  Keytruda (pembrulizumab)  To help prevent nausea and vomiting after your treatment, we encourage you to take your nausea medication as prescribed.   If you develop nausea and vomiting that is not controlled by your nausea medication, call the clinic.   BELOW ARE SYMPTOMS THAT SHOULD BE REPORTED IMMEDIATELY:  *FEVER GREATER THAN 100.5 F  *CHILLS WITH OR WITHOUT FEVER  NAUSEA AND VOMITING THAT IS NOT CONTROLLED WITH YOUR NAUSEA MEDICATION  *UNUSUAL SHORTNESS OF BREATH  *UNUSUAL BRUISING OR BLEEDING  TENDERNESS IN MOUTH AND THROAT WITH OR WITHOUT PRESENCE OF ULCERS  *URINARY PROBLEMS  *BOWEL PROBLEMS  UNUSUAL RASH Items with * indicate a potential emergency and should be followed up as soon as possible.  Feel free to call the clinic should you have any questions or concerns. The clinic phone number is [phone].  Please show the [facility] at check-in to the Emergency Department and triage nurse.

## 2018-05-14 NOTE — Telephone Encounter (Signed)
Gave avs and calendar ° °

## 2018-05-16 NOTE — Progress Notes (Deleted)
Cardiology Office Note   Date:  05/16/2018   ID:  Berklie Dethlefs, DOB 01-28-1941, MRN [id number]  PCP:  [name], MD  Cardiologist:  Dr. Johnsie Cancel     No chief complaint on file.     History of Present Illness: Cassidy Sanders is a 78 y.o. female who presents for post hospitalization for a fib after admit for diarrhea -C. Diff, and  hx of metastatic non small cell carcinoma, lung cancer.  She had hx of malignant Rt effusion.  And had pleur X catheter.  She went into a fib 06/30/17 -no chest pain.  Her CHA2DS2VASc score was 4-5.   She was started on IV dilt and amiodarone.   She did convert to SR.     She was discharged on BB and Eliquis.  Anticoagulation stopped due to rectal bleeding    2D echo with normal LVF EF 55-60% with G2DD, moderate PHTN and no pericardial effusion   Has had issues with recurrent malignant right pleural effusion with Pleur X catheter Rx Which has since been removed   Doing well with no cardiac complaints Sees Dr Maryellen Pile for oncology  Getting single immunotherapy With Keytruda currently iv every 3 weeks   ***  Past Medical History:  Diagnosis Date  . Arrhythmia   . Atrial fibrillation ([place])   . Brain tumor (benign) ([place])   . Depression   . Dyspnea    home oxygen  . Dysrhythmia    a fib  . History of home oxygen therapy 02/2017   2 L/minute  . Hypertension     Past Surgical History:  Procedure Laterality Date  . ABDOMINAL HYSTERECTOMY    . BLADDER SURGERY    . BRAIN SURGERY    . ENDOBRONCHIAL ULTRASOUND Bilateral 02/06/2017   Procedure: ENDOBRONCHIAL ULTRASOUND;  Surgeon: Juanito Doom, MD;  Location: [facility];  Service: Cardiopulmonary;  Laterality: Bilateral;  . IR GUIDED DRAIN W CATHETER PLACEMENT  06/29/2017  . IR REMOVAL OF PLURAL CATH W/CUFF  08/28/2017  . IR US GUIDE BX ASP/DRAIN  06/29/2017  . VIDEO BRONCHOSCOPY WITH ENDOBRONCHIAL ULTRASOUND N/A 03/23/2017   Procedure: VIDEO BRONCHOSCOPY WITH ENDOBRONCHIAL ULTRASOUND;   Surgeon: Juanito Doom, MD;  Location: [facility];  Service: Thoracic;  Laterality: N/A;     Current Outpatient Medications  Medication Sig Dispense Refill  . albuterol (PROVENTIL) (2.5 MG/3ML) 0.083% nebulizer solution VVN Q 4 H PRF WHEEZING OR SHORTNESS OF BREATH 360 mL 2  . Biotin 2.5 MG TABS Take 2,500 mcg by mouth daily.    . clobetasol cream (TEMOVATE) 9.24 % Apply 1 application topically 2 (two) times daily. 60 g 0  . diphenhydrAMINE (BENADRYL) 25 mg capsule Take 1 capsule (25 mg total) by mouth at bedtime as needed for sleep. 30 capsule 0  . feeding supplement, ENSURE ENLIVE, (ENSURE ENLIVE) LIQD Take 237 mLs by mouth 2 (two) times daily as needed (If patient or family requests, if pt unable to eat for a meal, or if intakes are poor). 237 mL 12  . hydrOXYzine (ATARAX/VISTARIL) 10 MG tablet Take 1 tablet (10 mg total) by mouth 3 (three) times daily as needed for itching. 30 tablet 1  . levalbuterol (XOPENEX) 0.63 MG/3ML nebulizer solution Take 3 mLs (0.63 mg total) by nebulization every 6 (six) hours as needed for wheezing or shortness of breath. 3 mL 12  . levothyroxine (SYNTHROID, LEVOTHROID) 100 MCG tablet TAKE 1 TABLET BY MOUTH EVERY DAY BEFORE BREAKFAST 90 tablet 1  . loperamide (IMODIUM)  2 MG capsule Take 2 mg by mouth as needed for diarrhea or loose stools.    . metoprolol tartrate (LOPRESSOR) 25 MG tablet Take 25 mg by mouth daily.     Marland Kitchen oxyCODONE (OXY IR/ROXICODONE) 5 MG immediate release tablet Take 1 tablet by mouth every 2 (two) hours as needed.   0  . Respiratory Therapy Supplies (FLUTTER) DEVI Use as directed. 1 each 0  . sertraline (ZOLOFT) 50 MG tablet Take 50 mg by mouth daily.     No current facility-administered medications for this visit.     Allergies:   Latex; Gabapentin; and Tape    Social History:  The patient  reports that she quit smoking about 5 years ago. Her smoking use included cigarettes. She has a 59.00 pack-year smoking history. She has never used  smokeless tobacco. She reports current alcohol use. She reports that she does not use drugs.   Family History:  The patient's Family history is unknown by patient.    ROS:  General:no colds or fevers, no weight changes Skin:no rashes or ulcers HEENT:no blurred vision, no congestion CV:see HPI PUL:see HPI GI:no diarrhea constipation or melena, no indigestion GU:no hematuria, no dysuria MS:no joint pain, no claudication Neuro:no syncope, no lightheadedness Endo:no diabetes, no thyroid disease  Wt Readings from Last 3 Encounters:  05/14/18 78.9 kg  04/24/18 80.1 kg  04/03/18 80.7 kg     PHYSICAL EXAM: VS:  There were no vitals taken for this visit. , BMI There is no height or weight on file to calculate BMI. Affect appropriate Chronically ill female  HEENT: normal Neck supple with no adenopathy JVP normal no bruits no thyromegaly Lungs clear no evidence of recurrent effusion  Heart:  S1/S2 no murmur, no rub, gallop or click PMI normal Abdomen: benighn, BS positve, no tenderness, no AAA no bruit.  No HSM or HJR Distal pulses intact with no bruits No edema Neuro non-focal Skin warm and dry No muscular weakness    EKG:  07/25/17  SR Q waves in III no changes from last EKG at hospital.     Recent Labs: 07/04/2017: Magnesium 1.8 05/14/2018: ALT 14; BUN 18; Creatinine 0.86; Hemoglobin 12.3; Platelet Count 226; Potassium 3.9; Sodium 142; TSH 7.206    Lipid Panel    Component Value Date/Time   TRIG 111 02/06/2017 0910       Other studies Reviewed: Additional studies/ records that were reviewed today include: . Echo 07/01/17  Study Conclusions  - Left ventricle: The cavity size was normal. Wall thickness was   increased in a pattern of moderate LVH. Systolic function was   normal. The estimated ejection fraction was in the range of 55%   to 60%. Features are consistent with a pseudonormal left   ventricular filling pattern, with concomitant abnormal relaxation    and increased filling pressure (grade 2 diastolic dysfunction).   Doppler parameters are consistent with high ventricular filling   pressure. - Aortic valve: Mildly to moderately calcified annulus. Trileaflet;   moderately thickened leaflets. Valve area (VTI): 2.29 cm^2. Valve   area (Vmax): 2.01 cm^2. Valve area (Vmean): 2.06 cm^2. - Mitral valve: Mildly calcified annulus. Mildly thickened leaflets   . - Left atrium: The atrium was severely dilated. - Right ventricle: The cavity size was mildly dilated. Wall   thickness was normal. - Right atrium: The atrium was mildly dilated. - Pulmonary arteries: Systolic pressure was moderately increased.   PA peak pressure: 45 mm Hg (S). - Technically difficult study.  ASSESSMENT AND PLAN:  1.  PAF, maintaining SR - she has no symptoms of a fib.     2.  Anticoagulation: held for rectal bleeding and Pleur X catheter removal   3.  Rt pl effusion worse by CT scan 11/23/17 with loculations and passive atelectasis plan per oncology   4.  HTN now on low end, off meds except lopressor.    5.  Metastatic non small cell carcinoma followed by oncology. On Ketruda    Jenkins Rouge

## 2018-05-17 ENCOUNTER — Telehealth: Payer: Self-pay | Admitting: Internal Medicine

## 2018-05-17 NOTE — Telephone Encounter (Signed)
MM PAL 3/24 moved appointments to 3/25. Confirmed with patient.

## 2018-05-28 ENCOUNTER — Ambulatory Visit: Payer: Medicare Other | Admitting: Cardiovascular Disease

## 2018-05-31 ENCOUNTER — Ambulatory Visit (HOSPITAL_COMMUNITY): Payer: Medicare Other

## 2018-06-04 ENCOUNTER — Ambulatory Visit: Payer: Medicare Other

## 2018-06-04 ENCOUNTER — Ambulatory Visit: Payer: Medicare Other | Admitting: Internal Medicine

## 2018-06-04 ENCOUNTER — Other Ambulatory Visit: Payer: Self-pay | Admitting: Physician Assistant

## 2018-06-04 ENCOUNTER — Other Ambulatory Visit: Payer: Medicare Other

## 2018-06-04 DIAGNOSIS — R5383 Other fatigue: Secondary | ICD-10-CM

## 2018-06-04 DIAGNOSIS — C3491 Malignant neoplasm of unspecified part of right bronchus or lung: Secondary | ICD-10-CM

## 2018-06-05 ENCOUNTER — Inpatient Hospital Stay: Payer: Medicare Other

## 2018-06-05 ENCOUNTER — Inpatient Hospital Stay (HOSPITAL_BASED_OUTPATIENT_CLINIC_OR_DEPARTMENT_OTHER): Payer: Medicare Other | Admitting: Internal Medicine

## 2018-06-05 ENCOUNTER — Other Ambulatory Visit: Payer: Self-pay

## 2018-06-05 ENCOUNTER — Encounter: Payer: Self-pay | Admitting: Internal Medicine

## 2018-06-05 ENCOUNTER — Telehealth (HOSPITAL_COMMUNITY): Payer: Self-pay

## 2018-06-05 VITALS — BP 139/81 | HR 96 | Temp 98.0°F | Resp 18 | Ht 67.0 in | Wt 173.1 lb

## 2018-06-05 DIAGNOSIS — I10 Essential (primary) hypertension: Secondary | ICD-10-CM

## 2018-06-05 DIAGNOSIS — Z5112 Encounter for antineoplastic immunotherapy: Secondary | ICD-10-CM

## 2018-06-05 DIAGNOSIS — C3491 Malignant neoplasm of unspecified part of right bronchus or lung: Secondary | ICD-10-CM

## 2018-06-05 DIAGNOSIS — C382 Malignant neoplasm of posterior mediastinum: Secondary | ICD-10-CM

## 2018-06-05 DIAGNOSIS — R5383 Other fatigue: Secondary | ICD-10-CM

## 2018-06-05 DIAGNOSIS — Z79899 Other long term (current) drug therapy: Secondary | ICD-10-CM | POA: Diagnosis not present

## 2018-06-05 LAB — CMP (CANCER CENTER ONLY)
ALT: 14 U/L (ref 0–44)
AST: 22 U/L (ref 15–41)
Albumin: 3 g/dL — ABNORMAL LOW (ref 3.5–5.0)
Alkaline Phosphatase: 145 U/L — ABNORMAL HIGH (ref 38–126)
Anion gap: 8 (ref 5–15)
BILIRUBIN TOTAL: 0.6 mg/dL (ref 0.3–1.2)
BUN: 15 mg/dL (ref 8–23)
CALCIUM: 9.3 mg/dL (ref 8.9–10.3)
CO2: 29 mmol/L (ref 22–32)
Chloride: 106 mmol/L (ref 98–111)
Creatinine: 0.84 mg/dL (ref 0.44–1.00)
GFR, Est AFR Am: >60 mL/min (ref >60)
GFR, Estimated: >60 mL/min (ref >60)
Glucose, Bld: 105 mg/dL — ABNORMAL HIGH (ref 70–99)
Potassium: 3.8 mmol/L (ref 3.5–5.1)
Sodium: 143 mmol/L (ref 135–145)
TOTAL PROTEIN: 7.7 g/dL (ref 6.5–8.1)

## 2018-06-05 LAB — CBC WITH DIFFERENTIAL (CANCER CENTER ONLY)
Abs Immature Granulocytes: 0.02 10*3/uL (ref 0.00–0.07)
Basophils Absolute: 0.1 10*3/uL (ref 0.0–0.1)
Basophils Relative: 1 %
EOS PCT: 2 %
Eosinophils Absolute: 0.1 10*3/uL (ref 0.0–0.5)
HEMATOCRIT: 39.6 % (ref 36.0–46.0)
Hemoglobin: 12.5 g/dL (ref 12.0–15.0)
Immature Granulocytes: 0 %
Lymphocytes Relative: 16 %
Lymphs Abs: 1 10*3/uL (ref 0.7–4.0)
MCH: 27.7 pg (ref 26.0–34.0)
MCHC: 31.6 g/dL (ref 30.0–36.0)
MCV: 87.6 fL (ref 80.0–100.0)
Monocytes Absolute: 0.5 10*3/uL (ref 0.1–1.0)
Monocytes Relative: 8 %
Neutro Abs: 4.6 10*3/uL (ref 1.7–7.7)
Neutrophils Relative %: 73 %
Platelet Count: 239 10*3/uL (ref 150–400)
RBC: 4.52 MIL/uL (ref 3.87–5.11)
RDW: 13.1 % (ref 11.5–15.5)
WBC Count: 6.3 10*3/uL (ref 4.0–10.5)
nRBC: 0 % (ref 0.0–0.2)

## 2018-06-05 LAB — TSH: TSH: 4.054 u[IU]/mL — ABNORMAL HIGH (ref 0.308–3.960)

## 2018-06-05 MED ORDER — HEPARIN SOD (PORK) LOCK FLUSH 100 UNIT/ML IV SOLN
500.0000 [IU] | Freq: Once | INTRAVENOUS | Status: DC | PRN
  Filled 2018-06-05: qty 5

## 2018-06-05 MED ORDER — SODIUM CHLORIDE 0.9% FLUSH
10.0000 mL | INTRAVENOUS | Status: DC | PRN
  Filled 2018-06-05: qty 10

## 2018-06-05 MED ORDER — SODIUM CHLORIDE 0.9 % IV SOLN
Freq: Once | INTRAVENOUS | Status: AC
  Administered 2018-06-05: 14:00:00 via INTRAVENOUS
  Filled 2018-06-05: qty 250

## 2018-06-05 MED ORDER — SODIUM CHLORIDE 0.9 % IV SOLN
200.0000 mg | Freq: Once | INTRAVENOUS | Status: AC
  Administered 2018-06-05: 200 mg via INTRAVENOUS
  Filled 2018-06-05: qty 8

## 2018-06-05 NOTE — Progress Notes (Signed)
[facility] [facility] [phone]   [phone]  OFFICE PROGRESS NOTE  [name], MD [address] [place] [address]  DIAGNOSIS: Metastatic non-small cell carcinoma (T3, N2, M1a) non-small cell lung cancer diagnosed in November 2018 and presented with large right suprahilar and mediastinal mass as well as recent right pleural effusion.  Guardant 360: No actionable mutations.  PRIOR THERAPY:  1) Palliative radiotherapy to the large mediastinal mass under the care of Dr. Lisbeth Renshaw. 2) Systemic chemotherapy with carboplatin for AUC of 5, paclitaxel 175 mg/M2 and Keytruda 200 mg IV every 3 weeks.  First dose expected May 01, 2017.  Status post 3 cycles.  CURRENT THERAPY: Maintenance treatment with immunotherapy with single agent Keytruda 200 mg IV every 3 weeks.  First cycle 07/24/2017.  Status post 18 cycles.  INTERVAL HISTORY: Cassidy Sanders 78 y.o. female returns to the clinic today for follow-up visit.  The patient is feeling fine today with no concerning complaints except for the baseline shortness of breath and she is currently on home oxygen.  She denied having any chest pain, cough or hemoptysis.  She denied having any recent weight loss or night sweats.  She has no fever or chills.  She denied having any nausea, vomiting, diarrhea or constipation.  She was supposed to have repeat CT scan of the chest before this visit but the patient was worried about coming to the hospital during the [place] epidemic.  She is here today for evaluation before starting cycle #19 of her treatment.  MEDICAL HISTORY: Past Medical History:  Diagnosis Date   Arrhythmia    Atrial fibrillation ([place])    Brain tumor (benign) ([place])    Depression    Dyspnea    home oxygen   Dysrhythmia    a fib   History of home oxygen therapy 02/2017   2 L/minute   Hypertension     ALLERGIES:  is allergic to latex; gabapentin; and tape.  MEDICATIONS:  Current  Outpatient Medications  Medication Sig Dispense Refill   albuterol (PROVENTIL) (2.5 MG/3ML) 0.083% nebulizer solution VVN Q 4 H PRF WHEEZING OR SHORTNESS OF BREATH 360 mL 2   Biotin 2.5 MG TABS Take 2,500 mcg by mouth daily.     clobetasol cream (TEMOVATE) 5.59 % Apply 1 application topically 2 (two) times daily. 60 g 0   diphenhydrAMINE (BENADRYL) 25 mg capsule Take 1 capsule (25 mg total) by mouth at bedtime as needed for sleep. 30 capsule 0   feeding supplement, ENSURE ENLIVE, (ENSURE ENLIVE) LIQD Take 237 mLs by mouth 2 (two) times daily as needed (If patient or family requests, if pt unable to eat for a meal, or if intakes are poor). 237 mL 12   hydrOXYzine (ATARAX/VISTARIL) 10 MG tablet Take 1 tablet (10 mg total) by mouth 3 (three) times daily as needed for itching. 30 tablet 1   levalbuterol (XOPENEX) 0.63 MG/3ML nebulizer solution Take 3 mLs (0.63 mg total) by nebulization every 6 (six) hours as needed for wheezing or shortness of breath. 3 mL 12   levothyroxine (SYNTHROID, LEVOTHROID) 100 MCG tablet TAKE 1 TABLET BY MOUTH EVERY DAY BEFORE BREAKFAST 90 tablet 1   loperamide (IMODIUM) 2 MG capsule Take 2 mg by mouth as needed for diarrhea or loose stools.     metoprolol tartrate (LOPRESSOR) 25 MG tablet Take 25 mg by mouth daily.      oxyCODONE (OXY IR/ROXICODONE) 5 MG immediate release tablet Take 1 tablet by mouth  every 2 (two) hours as needed.   0   Respiratory Therapy Supplies (FLUTTER) DEVI Use as directed. 1 each 0   sertraline (ZOLOFT) 50 MG tablet Take 50 mg by mouth daily.     No current facility-administered medications for this visit.     SURGICAL HISTORY:  Past Surgical History:  Procedure Laterality Date   ABDOMINAL HYSTERECTOMY     BLADDER SURGERY     BRAIN SURGERY     ENDOBRONCHIAL ULTRASOUND Bilateral 02/06/2017   Procedure: ENDOBRONCHIAL ULTRASOUND;  Surgeon: Juanito Doom, MD;  Location: [facility];  Service: Cardiopulmonary;  Laterality:  Bilateral;   IR GUIDED DRAIN W CATHETER PLACEMENT  06/29/2017   IR REMOVAL OF PLURAL CATH W/CUFF  08/28/2017   IR US GUIDE BX ASP/DRAIN  06/29/2017   VIDEO BRONCHOSCOPY WITH ENDOBRONCHIAL ULTRASOUND N/A 03/23/2017   Procedure: VIDEO BRONCHOSCOPY WITH ENDOBRONCHIAL ULTRASOUND;  Surgeon: Juanito Doom, MD;  Location: [facility];  Service: Thoracic;  Laterality: N/A;    REVIEW OF SYSTEMS:  A comprehensive review of systems was negative except for: Respiratory: positive for dyspnea on exertion   PHYSICAL EXAMINATION: General appearance: alert, cooperative and no distress Head: Normocephalic, without obvious abnormality, atraumatic Neck: no adenopathy, no JVD, supple, symmetrical, trachea midline and thyroid not enlarged, symmetric, no tenderness/mass/nodules Lymph nodes: Cervical, supraclavicular, and axillary nodes normal. Resp: clear to auscultation bilaterally Back: symmetric, no curvature. ROM normal. No CVA tenderness. Cardio: regular rate and rhythm, S1, S2 normal, no murmur, click, rub or gallop GI: soft, non-tender; bowel sounds normal; no masses,  no organomegaly Extremities: extremities normal, atraumatic, no cyanosis or edema  ECOG PERFORMANCE STATUS: 1 - Symptomatic but completely ambulatory  Blood pressure 139/81, pulse 96, temperature 98 F (36.7 C), temperature source Oral, resp. rate 18, height 5\' 7"  (1.702 m), weight 173 lb 1.6 oz (78.5 kg), SpO2 95 %, peak flow (!) 2 L/min.  LABORATORY DATA: Lab Results  Component Value Date   WBC 6.3 06/05/2018   HGB 12.5 06/05/2018   HCT 39.6 06/05/2018   MCV 87.6 06/05/2018   PLT 239 06/05/2018      Chemistry      Component Value Date/Time   NA 142 05/14/2018 1259   NA 143 03/15/2017 1436   K 3.9 05/14/2018 1259   K 3.7 03/15/2017 1436   CL 105 05/14/2018 1259   CO2 27 05/14/2018 1259   CO2 34 (H) 03/15/2017 1436   BUN 18 05/14/2018 1259   BUN 17.8 03/15/2017 1436   CREATININE 0.86 05/14/2018 1259   CREATININE 0.8  03/15/2017 1436      Component Value Date/Time   CALCIUM 9.1 05/14/2018 1259   CALCIUM 9.6 03/15/2017 1436   ALKPHOS 133 (H) 05/14/2018 1259   ALKPHOS 88 03/15/2017 1436   AST 19 05/14/2018 1259   AST 16 03/15/2017 1436   ALT 14 05/14/2018 1259   ALT 15 03/15/2017 1436   BILITOT 0.5 05/14/2018 1259   BILITOT 0.52 03/15/2017 1436       RADIOGRAPHIC STUDIES: No results found.  ASSESSMENT AND PLAN: This is a very pleasant 78 years old white female recently diagnosed with probably stage IV non-small cell lung cancer presented with large right suprahilar lung mass in addition to mediastinal lymphadenopathy and suspicious malignant right pleural effusion.  The patient underwent systemic chemotherapy with carboplatin, paclitaxel and Keytruda status post 3 cycles.  She was tolerating this treatment well with no concerning complaints except for the last cycle when she was admitted to the  hospital with C. difficile as well as dehydration and renal insufficiency.   She is currently on treatment with single agent Ketruda (pembrolizumab) status post 18 cycles.  The patient continues to tolerate her treatment well with no concerning adverse effects. I recommended for her to proceed with cycle #19 today as scheduled. I will see her back for follow-up visit in 3 weeks for evaluation but we will reschedule her scan to be done before her next visit. She was advised to call immediately if she has any concerning symptoms in the interval. The patient voices understanding of current disease status and treatment options and is in agreement with the current care plan. All questions were answered. The patient knows to call the clinic with any problems, questions or concerns. We can certainly see the patient much sooner if necessary.  Disclaimer: This note was dictated with voice recognition software. Similar sounding words can inadvertently be transcribed and may not be corrected upon review.

## 2018-06-05 NOTE — Patient Instructions (Signed)
[facility] Discharge Instructions for Patients Receiving Chemotherapy  Today you received the following chemotherapy agents:  Keytruda (pembrulizumab)  To help prevent nausea and vomiting after your treatment, we encourage you to take your nausea medication as prescribed.   If you develop nausea and vomiting that is not controlled by your nausea medication, call the clinic.   BELOW ARE SYMPTOMS THAT SHOULD BE REPORTED IMMEDIATELY:  *FEVER GREATER THAN 100.5 F  *CHILLS WITH OR WITHOUT FEVER  NAUSEA AND VOMITING THAT IS NOT CONTROLLED WITH YOUR NAUSEA MEDICATION  *UNUSUAL SHORTNESS OF BREATH  *UNUSUAL BRUISING OR BLEEDING  TENDERNESS IN MOUTH AND THROAT WITH OR WITHOUT PRESENCE OF ULCERS  *URINARY PROBLEMS  *BOWEL PROBLEMS  UNUSUAL RASH Items with * indicate a potential emergency and should be followed up as soon as possible.  Feel free to call the clinic should you have any questions or concerns. The clinic phone number is [phone].  Please show the [facility] at check-in to the Emergency Department and triage nurse.

## 2018-06-19 DIAGNOSIS — I119 Hypertensive heart disease without heart failure: Secondary | ICD-10-CM | POA: Diagnosis not present

## 2018-06-19 DIAGNOSIS — C3491 Malignant neoplasm of unspecified part of right bronchus or lung: Secondary | ICD-10-CM | POA: Diagnosis not present

## 2018-06-24 ENCOUNTER — Other Ambulatory Visit: Payer: Self-pay

## 2018-06-24 ENCOUNTER — Ambulatory Visit (HOSPITAL_COMMUNITY)
Admission: RE | Admit: 2018-06-24 | Discharge: 2018-06-24 | Disposition: A | Discharge status: Home / Self Care | Payer: Medicare Other | Source: Ambulatory Visit | Attending: Internal Medicine | Admitting: Internal Medicine

## 2018-06-24 DIAGNOSIS — K8020 Calculus of gallbladder without cholecystitis without obstruction: Secondary | ICD-10-CM | POA: Diagnosis not present

## 2018-06-24 DIAGNOSIS — C3490 Malignant neoplasm of unspecified part of unspecified bronchus or lung: Secondary | ICD-10-CM | POA: Diagnosis not present

## 2018-06-24 DIAGNOSIS — R05 Cough: Secondary | ICD-10-CM | POA: Diagnosis not present

## 2018-06-24 DIAGNOSIS — R0602 Shortness of breath: Secondary | ICD-10-CM | POA: Diagnosis not present

## 2018-06-24 MED ORDER — IOHEXOL 300 MG/ML  SOLN
100.0000 mL | Freq: Once | INTRAMUSCULAR | Status: AC | PRN
  Administered 2018-06-24: 09:00:00 100 mL via INTRAVENOUS

## 2018-06-24 MED ORDER — SODIUM CHLORIDE (PF) 0.9 % IJ SOLN
INTRAMUSCULAR | Status: AC
  Filled 2018-06-24: qty 50

## 2018-06-25 ENCOUNTER — Encounter: Payer: Self-pay | Admitting: Physician Assistant

## 2018-06-25 ENCOUNTER — Other Ambulatory Visit: Payer: Self-pay

## 2018-06-25 ENCOUNTER — Inpatient Hospital Stay: Payer: Medicare Other

## 2018-06-25 ENCOUNTER — Inpatient Hospital Stay: Payer: Medicare Other | Attending: Internal Medicine | Admitting: Physician Assistant

## 2018-06-25 VITALS — BP 116/71 | HR 85 | Temp 98.1°F | Resp 18 | Ht 67.0 in | Wt 173.5 lb

## 2018-06-25 DIAGNOSIS — Z79899 Other long term (current) drug therapy: Secondary | ICD-10-CM | POA: Insufficient documentation

## 2018-06-25 DIAGNOSIS — J90 Pleural effusion, not elsewhere classified: Secondary | ICD-10-CM

## 2018-06-25 DIAGNOSIS — R0609 Other forms of dyspnea: Secondary | ICD-10-CM | POA: Diagnosis not present

## 2018-06-25 DIAGNOSIS — C3491 Malignant neoplasm of unspecified part of right bronchus or lung: Secondary | ICD-10-CM | POA: Diagnosis not present

## 2018-06-25 DIAGNOSIS — Z5112 Encounter for antineoplastic immunotherapy: Secondary | ICD-10-CM | POA: Insufficient documentation

## 2018-06-25 DIAGNOSIS — R5383 Other fatigue: Secondary | ICD-10-CM

## 2018-06-25 DIAGNOSIS — Z9981 Dependence on supplemental oxygen: Secondary | ICD-10-CM

## 2018-06-25 DIAGNOSIS — C382 Malignant neoplasm of posterior mediastinum: Secondary | ICD-10-CM

## 2018-06-25 LAB — CBC WITH DIFFERENTIAL (CANCER CENTER ONLY)
Abs Immature Granulocytes: 0.03 10*3/uL (ref 0.00–0.07)
Basophils Absolute: 0.1 10*3/uL (ref 0.0–0.1)
Basophils Relative: 1 %
Eosinophils Absolute: 0.1 10*3/uL (ref 0.0–0.5)
Eosinophils Relative: 1 %
HCT: 37.8 % (ref 36.0–46.0)
Hemoglobin: 12 g/dL (ref 12.0–15.0)
Immature Granulocytes: 0 %
Lymphocytes Relative: 16 %
Lymphs Abs: 1.2 10*3/uL (ref 0.7–4.0)
MCH: 27.7 pg (ref 26.0–34.0)
MCHC: 31.7 g/dL (ref 30.0–36.0)
MCV: 87.3 fL (ref 80.0–100.0)
Monocytes Absolute: 0.8 10*3/uL (ref 0.1–1.0)
Monocytes Relative: 10 %
Neutro Abs: 5.2 10*3/uL (ref 1.7–7.7)
Neutrophils Relative %: 72 %
Platelet Count: 211 10*3/uL (ref 150–400)
RBC: 4.33 MIL/uL (ref 3.87–5.11)
RDW: 13.5 % (ref 11.5–15.5)
WBC Count: 7.4 10*3/uL (ref 4.0–10.5)
nRBC: 0 % (ref 0.0–0.2)

## 2018-06-25 LAB — CMP (CANCER CENTER ONLY)
ALT: 13 U/L (ref 0–44)
AST: 19 U/L (ref 15–41)
Albumin: 2.9 g/dL — ABNORMAL LOW (ref 3.5–5.0)
Alkaline Phosphatase: 143 U/L — ABNORMAL HIGH (ref 38–126)
Anion gap: 10 (ref 5–15)
BUN: 15 mg/dL (ref 8–23)
CO2: 27 mmol/L (ref 22–32)
Calcium: 9.1 mg/dL (ref 8.9–10.3)
Chloride: 102 mmol/L (ref 98–111)
Creatinine: 0.81 mg/dL (ref 0.44–1.00)
GFR, Est AFR Am: >60 mL/min (ref >60)
GFR, Estimated: >60 mL/min (ref >60)
Glucose, Bld: 92 mg/dL (ref 70–99)
Potassium: 3.7 mmol/L (ref 3.5–5.1)
Sodium: 139 mmol/L (ref 135–145)
Total Bilirubin: 0.7 mg/dL (ref 0.3–1.2)
Total Protein: 7.6 g/dL (ref 6.5–8.1)

## 2018-06-25 LAB — TSH: TSH: 4.712 u[IU]/mL — ABNORMAL HIGH (ref 0.308–3.960)

## 2018-06-25 MED ORDER — SODIUM CHLORIDE 0.9 % IV SOLN
200.0000 mg | Freq: Once | INTRAVENOUS | Status: AC
  Administered 2018-06-25: 200 mg via INTRAVENOUS
  Filled 2018-06-25: qty 8

## 2018-06-25 MED ORDER — SODIUM CHLORIDE 0.9 % IV SOLN
Freq: Once | INTRAVENOUS | Status: AC
  Administered 2018-06-25: 14:00:00 via INTRAVENOUS
  Filled 2018-06-25: qty 250

## 2018-06-25 NOTE — Progress Notes (Signed)
[facility] OFFICE PROGRESS NOTE  Cassidy Panda, MD [address] [place] [address]  DIAGNOSIS: Metastatic non-small cell carcinoma (T3, N2, M1a) non-small cell lung cancer diagnosed in November 2018 and presented with large right suprahilar and mediastinal mass as well as recent right pleural effusion.  Guardant 360: No actionable mutations.  PRIOR THERAPY:  1) Palliative radiotherapy to the large mediastinal mass under the care of Dr. Lisbeth Renshaw. 2) Systemic chemotherapy with carboplatin for AUC of 5, paclitaxel 175 mg/M2 and Keytruda 200 mg IV every 3 weeks.  First dose expected May 01, 2017.  Status post 3 cycles.  CURRENT THERAPY: Maintenance treatment with immunotherapy with single agent Keytruda 200 mg IV every 3 weeks.  First cycle 07/24/2017.  Status post 19 cycles.  INTERVAL HISTORY: Cassidy Sanders 78 y.o. female returns to the clinic today for a follow-up visit.  The patient is feeling well today without any concerning complaints.  She has some baseline shortness of breath with exertion and she is currently on 2 L of home oxygen.  The patient continues to tolerate treatment well without any adverse effects.  She denies any fever, chills, night sweats, or weight loss.  She denies any chest pain, cough, or hemoptysis.  She denies any nausea, vomiting, diarrhea, or constipation.  She denies any headache or visual changes.  She denies any rashes or skin changes.  She recently had a restaging CT scan performed and is here for evaluation and to discuss the scan results.   MEDICAL HISTORY: Past Medical History:  Diagnosis Date  . Arrhythmia   . Atrial fibrillation ([place])   . Brain tumor (benign) ([place])   . Depression   . Dyspnea    home oxygen  . Dysrhythmia    a fib  . History of home oxygen therapy 02/2017   2 L/minute  . Hypertension     ALLERGIES:  is allergic to latex; gabapentin; and tape.  MEDICATIONS:  Current Outpatient Medications  Medication  Sig Dispense Refill  . albuterol (PROVENTIL) (2.5 MG/3ML) 0.083% nebulizer solution VVN Q 4 H PRF WHEEZING OR SHORTNESS OF BREATH 360 mL 2  . Biotin 2.5 MG TABS Take 2,500 mcg by mouth daily.    . clobetasol cream (TEMOVATE) 4.69 % Apply 1 application topically 2 (two) times daily. 60 g 0  . diphenhydrAMINE (BENADRYL) 25 mg capsule Take 1 capsule (25 mg total) by mouth at bedtime as needed for sleep. 30 capsule 0  . feeding supplement, ENSURE ENLIVE, (ENSURE ENLIVE) LIQD Take 237 mLs by mouth 2 (two) times daily as needed (If patient or family requests, if pt unable to eat for a meal, or if intakes are poor). 237 mL 12  . hydrOXYzine (ATARAX/VISTARIL) 10 MG tablet Take 1 tablet (10 mg total) by mouth 3 (three) times daily as needed for itching. 30 tablet 1  . levalbuterol (XOPENEX) 0.63 MG/3ML nebulizer solution Take 3 mLs (0.63 mg total) by nebulization every 6 (six) hours as needed for wheezing or shortness of breath. 3 mL 12  . levothyroxine (SYNTHROID, LEVOTHROID) 100 MCG tablet TAKE 1 TABLET BY MOUTH EVERY DAY BEFORE BREAKFAST 90 tablet 1  . loperamide (IMODIUM) 2 MG capsule Take 2 mg by mouth as needed for diarrhea or loose stools.    . metoprolol tartrate (LOPRESSOR) 25 MG tablet Take 25 mg by mouth daily.     Marland Kitchen oxyCODONE (OXY IR/ROXICODONE) 5 MG immediate release tablet Take 1 tablet by mouth every 2 (two) hours as needed.  0  . Respiratory Therapy Supplies (FLUTTER) DEVI Use as directed. 1 each 0  . sertraline (ZOLOFT) 50 MG tablet Take 50 mg by mouth daily.     No current facility-administered medications for this visit.     SURGICAL HISTORY:  Past Surgical History:  Procedure Laterality Date  . ABDOMINAL HYSTERECTOMY    . BLADDER SURGERY    . BRAIN SURGERY    . ENDOBRONCHIAL ULTRASOUND Bilateral 02/06/2017   Procedure: ENDOBRONCHIAL ULTRASOUND;  Surgeon: Juanito Doom, MD;  Location: [facility];  Service: Cardiopulmonary;  Laterality: Bilateral;  . IR GUIDED DRAIN W  CATHETER PLACEMENT  06/29/2017  . IR REMOVAL OF PLURAL CATH W/CUFF  08/28/2017  . IR US GUIDE BX ASP/DRAIN  06/29/2017  . VIDEO BRONCHOSCOPY WITH ENDOBRONCHIAL ULTRASOUND N/A 03/23/2017   Procedure: VIDEO BRONCHOSCOPY WITH ENDOBRONCHIAL ULTRASOUND;  Surgeon: Juanito Doom, MD;  Location: [facility] OR;  Service: Thoracic;  Laterality: N/A;    REVIEW OF SYSTEMS:   Review of Systems  Constitutional: Negative for appetite change, chills, fatigue, fever and unexpected weight change.  HENT:   Negative for mouth sores, nosebleeds, sore throat and trouble swallowing.   Eyes: Negative for eye problems and icterus.  Respiratory: Negative for cough, hemoptysis, shortness of breath and wheezing.   Cardiovascular: Negative for chest pain and leg swelling.  Gastrointestinal: Negative for abdominal pain, constipation, diarrhea, nausea and vomiting.  Genitourinary: Negative for bladder incontinence, difficulty urinating, dysuria, frequency and hematuria.   Musculoskeletal: Negative for back pain, gait problem, neck pain and neck stiffness.  Skin: Negative for itching and rash.  Neurological: Negative for dizziness, extremity weakness, gait problem, headaches, light-headedness and seizures.  Hematological: Negative for adenopathy. Does not bruise/bleed easily.  Psychiatric/Behavioral: Negative for confusion, depression and sleep disturbance. The patient is not nervous/anxious.     PHYSICAL EXAMINATION:  Blood pressure 116/71, pulse 85, temperature 98.1 F (36.7 C), temperature source Oral, resp. rate 18, height 5\' 7"  (1.702 m), weight 173 lb 8 oz (78.7 kg), SpO2 97 %.  ECOG PERFORMANCE STATUS: 1 - Symptomatic but completely ambulatory  Physical Exam  Constitutional: Oriented to person, place, and time and well-developed, well-nourished, and in no distress. No distress.  HENT:  Head: Normocephalic and atraumatic.  Mouth/Throat: Oropharynx is clear and moist. No oropharyngeal exudate.  Eyes: Conjunctivae  are normal. Right eye exhibits no discharge. Left eye exhibits no discharge. No scleral icterus.  Neck: Normal range of motion. Neck supple.  Cardiovascular: Normal rate, regular rhythm, normal heart sounds and intact distal pulses.   Pulmonary/Chest: Effort normal and breath sounds normal. No respiratory distress. No wheezes. No rales.  Abdominal: Soft. Bowel sounds are normal. Exhibits no distension and no mass. There is no tenderness.  Musculoskeletal: Normal range of motion. Exhibits no edema.  Lymphadenopathy:    No cervical adenopathy.  Neurological: Alert and oriented to person, place, and time. Exhibits normal muscle tone. Gait normal. Coordination normal.  Skin: Skin is warm and dry. No rash noted. Not diaphoretic. No erythema. No pallor.  Psychiatric: Mood, memory and judgment normal.  Vitals reviewed.  LABORATORY DATA: Lab Results  Component Value Date   WBC 7.4 06/25/2018   HGB 12.0 06/25/2018   HCT 37.8 06/25/2018   MCV 87.3 06/25/2018   PLT 211 06/25/2018      Chemistry      Component Value Date/Time   NA 139 06/25/2018 1214   NA 143 03/15/2017 1436   K 3.7 06/25/2018 1214   K 3.7 03/15/2017 1436  CL 102 06/25/2018 1214   CO2 27 06/25/2018 1214   CO2 34 (H) 03/15/2017 1436   BUN 15 06/25/2018 1214   BUN 17.8 03/15/2017 1436   CREATININE 0.81 06/25/2018 1214   CREATININE 0.8 03/15/2017 1436      Component Value Date/Time   CALCIUM 9.1 06/25/2018 1214   CALCIUM 9.6 03/15/2017 1436   ALKPHOS 143 (H) 06/25/2018 1214   ALKPHOS 88 03/15/2017 1436   AST 19 06/25/2018 1214   AST 16 03/15/2017 1436   ALT 13 06/25/2018 1214   ALT 15 03/15/2017 1436   BILITOT 0.7 06/25/2018 1214   BILITOT 0.52 03/15/2017 1436       RADIOGRAPHIC STUDIES:  Ct Chest W Contrast  Result Date: 06/24/2018 CLINICAL DATA:  Right-sided lung cancer diagnosed in November 2018. On chemotherapy. Cough and shortness of breath. EXAM: CT CHEST, ABDOMEN, AND PELVIS WITH CONTRAST TECHNIQUE:  Multidetector CT imaging of the chest, abdomen and pelvis was performed following the standard protocol during bolus administration of intravenous contrast. CONTRAST:  15mL OMNIPAQUE IOHEXOL 300 MG/ML  SOLN COMPARISON:  02/15/2018 FINDINGS: CT CHEST FINDINGS Cardiovascular: Advanced aortic and branch vessel atherosclerosis. Tortuous thoracic aorta. Normal heart size, without pericardial effusion. Multivessel coronary artery atherosclerosis. No central pulmonary embolism, on this non-dedicated study. Mediastinum/Nodes: Low-density right thyroid nodules are nonspecific. 1.2 cm left supraclavicular low-density node is similar. Presumed radiation induced mediastinal edema is similar. No well-defined adenopathy. Right cardiophrenic angle node measures 7 mm on image 34/2 versus 9 mm on the prior. Prominent azygos and hemi azygous veins. Lungs/Pleura: Right-sided pleural effusion with loculation superiorly. This is slightly decreased. Similar mass-effect upon the right-sided endobronchial tree, most significant in the right upper lobe. Mild centrilobular emphysema. Left upper lobe ground-glass nodule measures 9 mm on image 32/4 and is unchanged. A 3 mm left lower lobe pulmonary nodule is felt to be slightly decreased from 5 mm on the prior exam. Example image 39/4 today. Similar appearance of consolidation and traction bronchiectasis throughout the majority of the right lung. Mild sparing of the low inferolateral right lower lobe. No convincing evidence of locally recurrent disease Musculoskeletal: Mild-to-moderate T12 superior endplate compression deformity is similar, without ventral canal encroachment. Mild superior endplate compression deformity T3 is also not significantly changed. CT ABDOMEN PELVIS FINDINGS Hepatobiliary: Hepatomegaly at 20 cm craniocaudal. Multiple small gallstones without acute cholecystitis or biliary duct dilatation. Pancreas: Normal, without mass or ductal dilatation. Spleen: Normal in size,  without focal abnormality. Adrenals/Urinary Tract: Left adrenal nodule of 2.4 x 2.0 cm, similar. Right adrenal nodule of 1.7 x 1.3 cm, also similar. Right renal vascular calcification. Bilateral too small to characterize renal lesions. Normal urinary bladder. Stomach/Bowel: Normal stomach, without wall thickening. Normal colon, appendix, and terminal ileum. Normal small bowel. Vascular/Lymphatic: Advanced aortic and branch vessel atherosclerosis. Reflux of contrast into the IVC, left renal vein, and left gonadal vein. No pelvic sidewall adenopathy. Reproductive: Hysterectomy.  No adnexal mass. Other: No significant free fluid. No evidence of omental or peritoneal disease. Musculoskeletal: Lumbosacral spondylosis. IMPRESSION: 1. Relatively similar appearance of presumed radiation induced consolidation throughout the right lung. No convincing evidence of locally recurrent disease. 2. Similar left upper lobe ground-glass nodule with suggestion of decrease in size of a 3 mm soft tissue density nodule in the left lower lobe. 3. Decrease in right pleural effusion with mild loculation. 4. Improvement in borderline right cardiophrenic angle adenopathy. Similar left supraclavicular adenopathy. 5. No typical findings of metastatic disease in the abdomen or pelvis. 6. Bilateral adrenal  adenomas. 7. Cholelithiasis. 8. Contrast reflux into the IVC, azygos system, and left renal vein. Greater than typically seen with elevated right heart pressures. Question secondary to central venous insufficiency at the level of the central left brachiocephalic vein. Electronically Signed   By: Abigail Miyamoto M.D.   On: 06/24/2018 11:43   Ct Abdomen Pelvis W Contrast  Result Date: 06/24/2018 CLINICAL DATA:  Right-sided lung cancer diagnosed in November 2018. On chemotherapy. Cough and shortness of breath. EXAM: CT CHEST, ABDOMEN, AND PELVIS WITH CONTRAST TECHNIQUE: Multidetector CT imaging of the chest, abdomen and pelvis was performed  following the standard protocol during bolus administration of intravenous contrast. CONTRAST:  152mL OMNIPAQUE IOHEXOL 300 MG/ML  SOLN COMPARISON:  02/15/2018 FINDINGS: CT CHEST FINDINGS Cardiovascular: Advanced aortic and branch vessel atherosclerosis. Tortuous thoracic aorta. Normal heart size, without pericardial effusion. Multivessel coronary artery atherosclerosis. No central pulmonary embolism, on this non-dedicated study. Mediastinum/Nodes: Low-density right thyroid nodules are nonspecific. 1.2 cm left supraclavicular low-density node is similar. Presumed radiation induced mediastinal edema is similar. No well-defined adenopathy. Right cardiophrenic angle node measures 7 mm on image 34/2 versus 9 mm on the prior. Prominent azygos and hemi azygous veins. Lungs/Pleura: Right-sided pleural effusion with loculation superiorly. This is slightly decreased. Similar mass-effect upon the right-sided endobronchial tree, most significant in the right upper lobe. Mild centrilobular emphysema. Left upper lobe ground-glass nodule measures 9 mm on image 32/4 and is unchanged. A 3 mm left lower lobe pulmonary nodule is felt to be slightly decreased from 5 mm on the prior exam. Example image 39/4 today. Similar appearance of consolidation and traction bronchiectasis throughout the majority of the right lung. Mild sparing of the low inferolateral right lower lobe. No convincing evidence of locally recurrent disease Musculoskeletal: Mild-to-moderate T12 superior endplate compression deformity is similar, without ventral canal encroachment. Mild superior endplate compression deformity T3 is also not significantly changed. CT ABDOMEN PELVIS FINDINGS Hepatobiliary: Hepatomegaly at 20 cm craniocaudal. Multiple small gallstones without acute cholecystitis or biliary duct dilatation. Pancreas: Normal, without mass or ductal dilatation. Spleen: Normal in size, without focal abnormality. Adrenals/Urinary Tract: Left adrenal nodule of  2.4 x 2.0 cm, similar. Right adrenal nodule of 1.7 x 1.3 cm, also similar. Right renal vascular calcification. Bilateral too small to characterize renal lesions. Normal urinary bladder. Stomach/Bowel: Normal stomach, without wall thickening. Normal colon, appendix, and terminal ileum. Normal small bowel. Vascular/Lymphatic: Advanced aortic and branch vessel atherosclerosis. Reflux of contrast into the IVC, left renal vein, and left gonadal vein. No pelvic sidewall adenopathy. Reproductive: Hysterectomy.  No adnexal mass. Other: No significant free fluid. No evidence of omental or peritoneal disease. Musculoskeletal: Lumbosacral spondylosis. IMPRESSION: 1. Relatively similar appearance of presumed radiation induced consolidation throughout the right lung. No convincing evidence of locally recurrent disease. 2. Similar left upper lobe ground-glass nodule with suggestion of decrease in size of a 3 mm soft tissue density nodule in the left lower lobe. 3. Decrease in right pleural effusion with mild loculation. 4. Improvement in borderline right cardiophrenic angle adenopathy. Similar left supraclavicular adenopathy. 5. No typical findings of metastatic disease in the abdomen or pelvis. 6. Bilateral adrenal adenomas. 7. Cholelithiasis. 8. Contrast reflux into the IVC, azygos system, and left renal vein. Greater than typically seen with elevated right heart pressures. Question secondary to central venous insufficiency at the level of the central left brachiocephalic vein. Electronically Signed   By: Abigail Miyamoto M.D.   On: 06/24/2018 11:43     ASSESSMENT/PLAN:  This is a very  pleasant 78 year old Caucasian female diagnosed with probable stage IV non-small cell lung cancer.  She presented with a large right suprahilar lung mass in addition to mediastinal lymphadenopathy and a recent right pleural effusion.   The patient underwent systemic chemotherapy with carboplatin, paclitaxel and Keytruda.  She is status post 3  cycles.  During the last cycle of treatment, she developed C. difficile, dehydration, and renal insufficiency. He is currently undergoing immunotherapy with Keytruda 200 mg IV every 3 weeks.  She is status post 19 cycles.  She is tolerating treatment well without any adverse effects.  The patient was seen with Dr. Julien Nordmann today.  She recently had a restaging CT scan performed and is here to discuss the scan results.  Dr. Julien Nordmann personally and independently reviewed the scan and discussed the results with the patient.  The scan showed no evidence of disease progression.  Dr. Julien Nordmann recommend she proceed with cycle #20 today as scheduled. I will see her back in 3 weeks for evaluation prior to starting cycle #21. The patient was advised to call immediately if she has any concerning symptoms in the interval. The patient voices understanding of current disease status and treatment options and is in agreement with the current care plan. All questions were answered. The patient knows to call the clinic with any problems, questions or concerns. We can certainly see the patient much sooner if necessary  No orders of the defined types were placed in this encounter.    Cassandra L Heilingoetter, PA-C 06/25/18  ADDENDUM: Hematology/Oncology Attending: I had a face-to-face encounter with the patient today.  I recommended her care plan.  This is a very pleasant 78 years old white female with a stage IV non-small cell lung cancer, squamous cell carcinoma.  She is currently undergoing treatment with single agent Keytruda status post 19 cycles.  She has been tolerating this treatment well with no concerning adverse effects. The patient had repeat CT scan of the chest, abdomen and pelvis performed recently.  I personally and independently reviewed the scans and discussed the results with the patient today. Her scan showed no concerning findings for disease progression. I recommended for the patient to continue her  current treatment with Keytruda she will proceed with cycle #20 today. I will see her back for follow-up visit in 3 weeks for evaluation before the next cycle of her treatment. The patient was advised to call immediately if she has any concerning symptoms in the interval.  Disclaimer: This note was dictated with voice recognition software. Similar sounding words can inadvertently be transcribed and may be missed upon review. [name], MD 06/25/18

## 2018-06-25 NOTE — Patient Instructions (Signed)
[facility] Discharge Instructions for Patients Receiving Chemotherapy  Today you received the following chemotherapy agents:  Keytruda (pembrulizumab)  To help prevent nausea and vomiting after your treatment, we encourage you to take your nausea medication as prescribed.   If you develop nausea and vomiting that is not controlled by your nausea medication, call the clinic.   BELOW ARE SYMPTOMS THAT SHOULD BE REPORTED IMMEDIATELY:  *FEVER GREATER THAN 100.5 F  *CHILLS WITH OR WITHOUT FEVER  NAUSEA AND VOMITING THAT IS NOT CONTROLLED WITH YOUR NAUSEA MEDICATION  *UNUSUAL SHORTNESS OF BREATH  *UNUSUAL BRUISING OR BLEEDING  TENDERNESS IN MOUTH AND THROAT WITH OR WITHOUT PRESENCE OF ULCERS  *URINARY PROBLEMS  *BOWEL PROBLEMS  UNUSUAL RASH Items with * indicate a potential emergency and should be followed up as soon as possible.  Feel free to call the clinic should you have any questions or concerns. The clinic phone number is [phone].  Please show the [facility] at check-in to the Emergency Department and triage nurse.  Coronavirus (COVID-19) Are you at risk?  Are you at risk for the Coronavirus (COVID-19)?  To be considered HIGH RISK for Coronavirus (COVID-19), you have to meet the following criteria:  . Traveled to [place], [place], [place], [place] or [place]; or in the [place] to [place], [place], [place], or [place]; and have fever, cough, and shortness of breath within the last 2 weeks of travel OR . Been in close contact with a person diagnosed with COVID-19 within the last 2 weeks and have fever, cough, and shortness of breath . IF YOU DO NOT MEET THESE CRITERIA, YOU ARE CONSIDERED LOW RISK FOR COVID-19.  What to do if you are HIGH RISK for COVID-19?  Marland Kitchen If you are having a medical emergency, call 911. . Seek medical care right away. Before you go to a doctor's office, urgent care or emergency department, call ahead and tell  them about your recent travel, contact with someone diagnosed with COVID-19, and your symptoms. You should receive instructions from your physician's office regarding next steps of care.  . When you arrive at healthcare provider, tell the healthcare staff immediately you have returned from visiting [place], [place], [place], [place] or [place]; or traveled in the [place] to [place], [place], [place], or [place]; in the last two weeks or you have been in close contact with a person diagnosed with COVID-19 in the last 2 weeks.   . Tell the health care staff about your symptoms: fever, cough and shortness of breath. . After you have been seen by a medical provider, you will be either: o Tested for (COVID-19) and discharged home on quarantine except to seek medical care if symptoms worsen, and asked to  - Stay home and avoid contact with others until you get your results (4-5 days)  - Avoid travel on public transportation if possible (such as bus, train, or airplane) or o Sent to the Emergency Department by EMS for evaluation, COVID-19 testing, and possible admission depending on your condition and test results.  What to do if you are LOW RISK for COVID-19?  Reduce your risk of any infection by using the same precautions used for avoiding the common cold or flu:  Marland Kitchen Wash your hands often with soap and warm water for at least 20 seconds.  If soap and water are not readily available, use an alcohol-based hand sanitizer with at least 60% alcohol.  . If coughing  or sneezing, cover your mouth and nose by coughing or sneezing into the elbow areas of your shirt or coat, into a tissue or into your sleeve (not your hands). . Avoid shaking hands with others and consider head nods or verbal greetings only. . Avoid touching your eyes, nose, or mouth with unwashed hands.  . Avoid close contact with people who are sick. . Avoid places or events with large numbers of people in one location, like concerts or  sporting events. . Carefully consider travel plans you have or are making. . If you are planning any travel outside or inside the [place], visit the CDC's Travelers' Health webpage for the latest health notices. . If you have some symptoms but not all symptoms, continue to monitor at home and seek medical attention if your symptoms worsen. . If you are having a medical emergency, call 911.   ADDITIONAL HEALTHCARE OPTIONS FOR PATIENTS  [facility] / e-Visit: [email or web address]         [facility]: [phone]  [facility]: [phone]                   [facility]: [phone]   

## 2018-07-16 ENCOUNTER — Inpatient Hospital Stay: Payer: Medicare Other

## 2018-07-16 ENCOUNTER — Other Ambulatory Visit: Payer: Self-pay

## 2018-07-16 ENCOUNTER — Encounter: Payer: Self-pay | Admitting: Internal Medicine

## 2018-07-16 ENCOUNTER — Inpatient Hospital Stay: Payer: Medicare Other | Attending: Internal Medicine | Admitting: Internal Medicine

## 2018-07-16 VITALS — BP 159/87 | HR 106 | Temp 97.9°F | Resp 20 | Ht 67.0 in | Wt 172.6 lb

## 2018-07-16 VITALS — HR 100

## 2018-07-16 DIAGNOSIS — R5383 Other fatigue: Secondary | ICD-10-CM

## 2018-07-16 DIAGNOSIS — Z79899 Other long term (current) drug therapy: Secondary | ICD-10-CM | POA: Diagnosis not present

## 2018-07-16 DIAGNOSIS — Z5112 Encounter for antineoplastic immunotherapy: Secondary | ICD-10-CM | POA: Diagnosis not present

## 2018-07-16 DIAGNOSIS — C3491 Malignant neoplasm of unspecified part of right bronchus or lung: Secondary | ICD-10-CM

## 2018-07-16 DIAGNOSIS — C382 Malignant neoplasm of posterior mediastinum: Secondary | ICD-10-CM

## 2018-07-16 DIAGNOSIS — R0609 Other forms of dyspnea: Secondary | ICD-10-CM

## 2018-07-16 DIAGNOSIS — Z9981 Dependence on supplemental oxygen: Secondary | ICD-10-CM | POA: Diagnosis not present

## 2018-07-16 LAB — CMP (CANCER CENTER ONLY)
ALT: 15 U/L (ref 0–44)
AST: 22 U/L (ref 15–41)
Albumin: 3.1 g/dL — ABNORMAL LOW (ref 3.5–5.0)
Alkaline Phosphatase: 136 U/L — ABNORMAL HIGH (ref 38–126)
Anion gap: 9 (ref 5–15)
BUN: 15 mg/dL (ref 8–23)
CO2: 27 mmol/L (ref 22–32)
Calcium: 9.1 mg/dL (ref 8.9–10.3)
Chloride: 104 mmol/L (ref 98–111)
Creatinine: 0.78 mg/dL (ref 0.44–1.00)
GFR, Est AFR Am: >60 mL/min (ref >60)
GFR, Estimated: >60 mL/min (ref >60)
Glucose, Bld: 104 mg/dL — ABNORMAL HIGH (ref 70–99)
Potassium: 3.6 mmol/L (ref 3.5–5.1)
Sodium: 140 mmol/L (ref 135–145)
Total Bilirubin: 0.5 mg/dL (ref 0.3–1.2)
Total Protein: 7.8 g/dL (ref 6.5–8.1)

## 2018-07-16 LAB — CBC WITH DIFFERENTIAL (CANCER CENTER ONLY)
Abs Immature Granulocytes: 0.02 10*3/uL (ref 0.00–0.07)
Basophils Absolute: 0 10*3/uL (ref 0.0–0.1)
Basophils Relative: 1 %
Eosinophils Absolute: 0.1 10*3/uL (ref 0.0–0.5)
Eosinophils Relative: 1 %
HCT: 37.7 % (ref 36.0–46.0)
Hemoglobin: 11.9 g/dL — ABNORMAL LOW (ref 12.0–15.0)
Immature Granulocytes: 0 %
Lymphocytes Relative: 16 %
Lymphs Abs: 1.1 10*3/uL (ref 0.7–4.0)
MCH: 27.2 pg (ref 26.0–34.0)
MCHC: 31.6 g/dL (ref 30.0–36.0)
MCV: 86.3 fL (ref 80.0–100.0)
Monocytes Absolute: 0.7 10*3/uL (ref 0.1–1.0)
Monocytes Relative: 10 %
Neutro Abs: 4.5 10*3/uL (ref 1.7–7.7)
Neutrophils Relative %: 72 %
Platelet Count: 226 10*3/uL (ref 150–400)
RBC: 4.37 MIL/uL (ref 3.87–5.11)
RDW: 14.1 % (ref 11.5–15.5)
WBC Count: 6.4 10*3/uL (ref 4.0–10.5)
nRBC: 0 % (ref 0.0–0.2)

## 2018-07-16 LAB — TSH: TSH: 3.436 u[IU]/mL (ref 0.308–3.960)

## 2018-07-16 MED ORDER — SODIUM CHLORIDE 0.9 % IV SOLN
Freq: Once | INTRAVENOUS | Status: AC
  Administered 2018-07-16: 15:00:00 via INTRAVENOUS
  Filled 2018-07-16: qty 250

## 2018-07-16 MED ORDER — SODIUM CHLORIDE 0.9 % IV SOLN
200.0000 mg | Freq: Once | INTRAVENOUS | Status: AC
  Administered 2018-07-16: 200 mg via INTRAVENOUS
  Filled 2018-07-16: qty 8

## 2018-07-16 NOTE — Progress Notes (Signed)
[place] Telephone:([phone]) [phone]   Fax:(336) [phone]  OFFICE PROGRESS NOTE  [name], MD [address] [place] [address]  DIAGNOSIS: Metastatic non-small cell carcinoma (T3, N2, M1a) non-small cell lung cancer diagnosed in November 2018 and presented with large right suprahilar and mediastinal mass as well as recent right pleural effusion.  Guardant 360: No actionable mutations.  PRIOR THERAPY:  1) Palliative radiotherapy to the large mediastinal mass under the care of Dr. Lisbeth Renshaw. 2) Systemic chemotherapy with carboplatin for AUC of 5, paclitaxel 175 mg/M2 and Keytruda 200 mg IV every 3 weeks.  First dose expected May 01, 2017.  Status post 3 cycles.  CURRENT THERAPY: Maintenance treatment with immunotherapy with single agent Keytruda 200 mg IV every 3 weeks.  First cycle 07/24/2017.  Status post 20 cycles.  INTERVAL HISTORY: Cassidy Sanders 78 y.o. female returns to the clinic today for follow-up visit.  The patient is feeling fine except for the baseline shortness of breath increased with exertion and she is currently on home oxygen.  She denied having any chest pain, cough or hemoptysis.  She denied having any fever or chills.  She has no nausea, vomiting, diarrhea or constipation.  The patient continues to tolerate her treatment with [facility] fairly well.  She is here today for evaluation before starting cycle #21.  MEDICAL HISTORY: Past Medical History:  Diagnosis Date   Arrhythmia    Atrial fibrillation ([place])    Brain tumor (benign) ([place])    Depression    Dyspnea    home oxygen   Dysrhythmia    a fib   History of home oxygen therapy 02/2017   2 L/minute   Hypertension     ALLERGIES:  is allergic to latex; gabapentin; and tape.  MEDICATIONS:  Current Outpatient Medications  Medication Sig Dispense Refill   albuterol (PROVENTIL) (2.5 MG/3ML) 0.083% nebulizer solution VVN Q 4 H PRF WHEEZING OR SHORTNESS OF BREATH 360 mL 2     Biotin 2.5 MG TABS Take 2,500 mcg by mouth daily.     clobetasol cream (TEMOVATE) 5.00 % Apply 1 application topically 2 (two) times daily. 60 g 0   diphenhydrAMINE (BENADRYL) 25 mg capsule Take 1 capsule (25 mg total) by mouth at bedtime as needed for sleep. 30 capsule 0   feeding supplement, ENSURE ENLIVE, (ENSURE ENLIVE) LIQD Take 237 mLs by mouth 2 (two) times daily as needed (If patient or family requests, if pt unable to eat for a meal, or if intakes are poor). 237 mL 12   hydrOXYzine (ATARAX/VISTARIL) 10 MG tablet Take 1 tablet (10 mg total) by mouth 3 (three) times daily as needed for itching. 30 tablet 1   levalbuterol (XOPENEX) 0.63 MG/3ML nebulizer solution Take 3 mLs (0.63 mg total) by nebulization every 6 (six) hours as needed for wheezing or shortness of breath. 3 mL 12   levothyroxine (SYNTHROID, LEVOTHROID) 100 MCG tablet TAKE 1 TABLET BY MOUTH EVERY DAY BEFORE BREAKFAST 90 tablet 1   loperamide (IMODIUM) 2 MG capsule Take 2 mg by mouth as needed for diarrhea or loose stools.     metoprolol tartrate (LOPRESSOR) 25 MG tablet Take 25 mg by mouth daily.      oxyCODONE (OXY IR/ROXICODONE) 5 MG immediate release tablet Take 1 tablet by mouth every 2 (two) hours as needed.   0   Respiratory Therapy Supplies (FLUTTER) DEVI Use as directed. 1 each 0   sertraline (ZOLOFT) 50 MG tablet Take 50 mg  by mouth daily.     No current facility-administered medications for this visit.     SURGICAL HISTORY:  Past Surgical History:  Procedure Laterality Date   ABDOMINAL HYSTERECTOMY     BLADDER SURGERY     BRAIN SURGERY     ENDOBRONCHIAL ULTRASOUND Bilateral 02/06/2017   Procedure: ENDOBRONCHIAL ULTRASOUND;  Surgeon: Juanito Doom, MD;  Location: [facility];  Service: Cardiopulmonary;  Laterality: Bilateral;   IR GUIDED DRAIN W CATHETER PLACEMENT  06/29/2017   IR REMOVAL OF PLURAL CATH W/CUFF  08/28/2017   IR US GUIDE BX ASP/DRAIN  06/29/2017   VIDEO BRONCHOSCOPY WITH  ENDOBRONCHIAL ULTRASOUND N/A 03/23/2017   Procedure: VIDEO BRONCHOSCOPY WITH ENDOBRONCHIAL ULTRASOUND;  Surgeon: Juanito Doom, MD;  Location: [place];  Service: Thoracic;  Laterality: N/A;    REVIEW OF SYSTEMS:  A comprehensive review of systems was negative except for: Respiratory: positive for dyspnea on exertion   PHYSICAL EXAMINATION: General appearance: alert, cooperative and no distress Head: Normocephalic, without obvious abnormality, atraumatic Neck: no adenopathy, no JVD, supple, symmetrical, trachea midline and thyroid not enlarged, symmetric, no tenderness/mass/nodules Lymph nodes: Cervical, supraclavicular, and axillary nodes normal. Resp: clear to auscultation bilaterally Back: symmetric, no curvature. ROM normal. No CVA tenderness. Cardio: regular rate and rhythm, S1, S2 normal, no murmur, click, rub or gallop GI: soft, non-tender; bowel sounds normal; no masses,  no organomegaly Extremities: extremities normal, atraumatic, no cyanosis or edema  ECOG PERFORMANCE STATUS: 1 - Symptomatic but completely ambulatory  Blood pressure (!) 159/87, pulse (!) 106, temperature 97.9 F (36.6 C), temperature source Oral, resp. rate 20, height 5\' 7"  (1.702 m), weight 172 lb 9.6 oz (78.3 kg), SpO2 97 %.  LABORATORY DATA: Lab Results  Component Value Date   WBC 7.4 06/25/2018   HGB 12.0 06/25/2018   HCT 37.8 06/25/2018   MCV 87.3 06/25/2018   PLT 211 06/25/2018      Chemistry      Component Value Date/Time   NA 139 06/25/2018 1214   NA 143 03/15/2017 1436   K 3.7 06/25/2018 1214   K 3.7 03/15/2017 1436   CL 102 06/25/2018 1214   CO2 27 06/25/2018 1214   CO2 34 (H) 03/15/2017 1436   BUN 15 06/25/2018 1214   BUN 17.8 03/15/2017 1436   CREATININE 0.81 06/25/2018 1214   CREATININE 0.8 03/15/2017 1436      Component Value Date/Time   CALCIUM 9.1 06/25/2018 1214   CALCIUM 9.6 03/15/2017 1436   ALKPHOS 143 (H) 06/25/2018 1214   ALKPHOS 88 03/15/2017 1436   AST 19  06/25/2018 1214   AST 16 03/15/2017 1436   ALT 13 06/25/2018 1214   ALT 15 03/15/2017 1436   BILITOT 0.7 06/25/2018 1214   BILITOT 0.52 03/15/2017 1436       RADIOGRAPHIC STUDIES: Ct Chest W Contrast  Result Date: 06/24/2018 CLINICAL DATA:  Right-sided lung cancer diagnosed in November 2018. On chemotherapy. Cough and shortness of breath. EXAM: CT CHEST, ABDOMEN, AND PELVIS WITH CONTRAST TECHNIQUE: Multidetector CT imaging of the chest, abdomen and pelvis was performed following the standard protocol during bolus administration of intravenous contrast. CONTRAST:  14mL OMNIPAQUE IOHEXOL 300 MG/ML  SOLN COMPARISON:  02/15/2018 FINDINGS: CT CHEST FINDINGS Cardiovascular: Advanced aortic and branch vessel atherosclerosis. Tortuous thoracic aorta. Normal heart size, without pericardial effusion. Multivessel coronary artery atherosclerosis. No central pulmonary embolism, on this non-dedicated study. Mediastinum/Nodes: Low-density right thyroid nodules are nonspecific. 1.2 cm left supraclavicular low-density node is similar. Presumed radiation induced  mediastinal edema is similar. No well-defined adenopathy. Right cardiophrenic angle node measures 7 mm on image 34/2 versus 9 mm on the prior. Prominent azygos and hemi azygous veins. Lungs/Pleura: Right-sided pleural effusion with loculation superiorly. This is slightly decreased. Similar mass-effect upon the right-sided endobronchial tree, most significant in the right upper lobe. Mild centrilobular emphysema. Left upper lobe ground-glass nodule measures 9 mm on image 32/4 and is unchanged. A 3 mm left lower lobe pulmonary nodule is felt to be slightly decreased from 5 mm on the prior exam. Example image 39/4 today. Similar appearance of consolidation and traction bronchiectasis throughout the majority of the right lung. Mild sparing of the low inferolateral right lower lobe. No convincing evidence of locally recurrent disease Musculoskeletal: Mild-to-moderate  T12 superior endplate compression deformity is similar, without ventral canal encroachment. Mild superior endplate compression deformity T3 is also not significantly changed. CT ABDOMEN PELVIS FINDINGS Hepatobiliary: Hepatomegaly at 20 cm craniocaudal. Multiple small gallstones without acute cholecystitis or biliary duct dilatation. Pancreas: Normal, without mass or ductal dilatation. Spleen: Normal in size, without focal abnormality. Adrenals/Urinary Tract: Left adrenal nodule of 2.4 x 2.0 cm, similar. Right adrenal nodule of 1.7 x 1.3 cm, also similar. Right renal vascular calcification. Bilateral too small to characterize renal lesions. Normal urinary bladder. Stomach/Bowel: Normal stomach, without wall thickening. Normal colon, appendix, and terminal ileum. Normal small bowel. Vascular/Lymphatic: Advanced aortic and branch vessel atherosclerosis. Reflux of contrast into the IVC, left renal vein, and left gonadal vein. No pelvic sidewall adenopathy. Reproductive: Hysterectomy.  No adnexal mass. Other: No significant free fluid. No evidence of omental or peritoneal disease. Musculoskeletal: Lumbosacral spondylosis. IMPRESSION: 1. Relatively similar appearance of presumed radiation induced consolidation throughout the right lung. No convincing evidence of locally recurrent disease. 2. Similar left upper lobe ground-glass nodule with suggestion of decrease in size of a 3 mm soft tissue density nodule in the left lower lobe. 3. Decrease in right pleural effusion with mild loculation. 4. Improvement in borderline right cardiophrenic angle adenopathy. Similar left supraclavicular adenopathy. 5. No typical findings of metastatic disease in the abdomen or pelvis. 6. Bilateral adrenal adenomas. 7. Cholelithiasis. 8. Contrast reflux into the IVC, azygos system, and left renal vein. Greater than typically seen with elevated right heart pressures. Question secondary to central venous insufficiency at the level of the central  left brachiocephalic vein. Electronically Signed   By: Abigail Miyamoto M.D.   On: 06/24/2018 11:43   Ct Abdomen Pelvis W Contrast  Result Date: 06/24/2018 CLINICAL DATA:  Right-sided lung cancer diagnosed in November 2018. On chemotherapy. Cough and shortness of breath. EXAM: CT CHEST, ABDOMEN, AND PELVIS WITH CONTRAST TECHNIQUE: Multidetector CT imaging of the chest, abdomen and pelvis was performed following the standard protocol during bolus administration of intravenous contrast. CONTRAST:  143mL OMNIPAQUE IOHEXOL 300 MG/ML  SOLN COMPARISON:  02/15/2018 FINDINGS: CT CHEST FINDINGS Cardiovascular: Advanced aortic and branch vessel atherosclerosis. Tortuous thoracic aorta. Normal heart size, without pericardial effusion. Multivessel coronary artery atherosclerosis. No central pulmonary embolism, on this non-dedicated study. Mediastinum/Nodes: Low-density right thyroid nodules are nonspecific. 1.2 cm left supraclavicular low-density node is similar. Presumed radiation induced mediastinal edema is similar. No well-defined adenopathy. Right cardiophrenic angle node measures 7 mm on image 34/2 versus 9 mm on the prior. Prominent azygos and hemi azygous veins. Lungs/Pleura: Right-sided pleural effusion with loculation superiorly. This is slightly decreased. Similar mass-effect upon the right-sided endobronchial tree, most significant in the right upper lobe. Mild centrilobular emphysema. Left upper lobe ground-glass nodule measures  9 mm on image 32/4 and is unchanged. A 3 mm left lower lobe pulmonary nodule is felt to be slightly decreased from 5 mm on the prior exam. Example image 39/4 today. Similar appearance of consolidation and traction bronchiectasis throughout the majority of the right lung. Mild sparing of the low inferolateral right lower lobe. No convincing evidence of locally recurrent disease Musculoskeletal: Mild-to-moderate T12 superior endplate compression deformity is similar, without ventral canal  encroachment. Mild superior endplate compression deformity T3 is also not significantly changed. CT ABDOMEN PELVIS FINDINGS Hepatobiliary: Hepatomegaly at 20 cm craniocaudal. Multiple small gallstones without acute cholecystitis or biliary duct dilatation. Pancreas: Normal, without mass or ductal dilatation. Spleen: Normal in size, without focal abnormality. Adrenals/Urinary Tract: Left adrenal nodule of 2.4 x 2.0 cm, similar. Right adrenal nodule of 1.7 x 1.3 cm, also similar. Right renal vascular calcification. Bilateral too small to characterize renal lesions. Normal urinary bladder. Stomach/Bowel: Normal stomach, without wall thickening. Normal colon, appendix, and terminal ileum. Normal small bowel. Vascular/Lymphatic: Advanced aortic and branch vessel atherosclerosis. Reflux of contrast into the IVC, left renal vein, and left gonadal vein. No pelvic sidewall adenopathy. Reproductive: Hysterectomy.  No adnexal mass. Other: No significant free fluid. No evidence of omental or peritoneal disease. Musculoskeletal: Lumbosacral spondylosis. IMPRESSION: 1. Relatively similar appearance of presumed radiation induced consolidation throughout the right lung. No convincing evidence of locally recurrent disease. 2. Similar left upper lobe ground-glass nodule with suggestion of decrease in size of a 3 mm soft tissue density nodule in the left lower lobe. 3. Decrease in right pleural effusion with mild loculation. 4. Improvement in borderline right cardiophrenic angle adenopathy. Similar left supraclavicular adenopathy. 5. No typical findings of metastatic disease in the abdomen or pelvis. 6. Bilateral adrenal adenomas. 7. Cholelithiasis. 8. Contrast reflux into the IVC, azygos system, and left renal vein. Greater than typically seen with elevated right heart pressures. Question secondary to central venous insufficiency at the level of the central left brachiocephalic vein. Electronically Signed   By: Abigail Miyamoto M.D.    On: 06/24/2018 11:43    ASSESSMENT AND PLAN: This is a very pleasant 78 years old white female recently diagnosed with probably stage IV non-small cell lung cancer presented with large right suprahilar lung mass in addition to mediastinal lymphadenopathy and suspicious malignant right pleural effusion.  The patient underwent systemic chemotherapy with carboplatin, paclitaxel and Keytruda status post 3 cycles.  She was tolerating this treatment well with no concerning complaints except for the last cycle when she was admitted to the hospital with C. difficile as well as dehydration and renal insufficiency.   She is currently on treatment with single agent Ketruda (pembrolizumab) status post 20 cycles.  The patient continues to tolerate her treatment well with no concerning adverse effects. I recommended for her to proceed with cycle #21 today as scheduled. I will see her back for follow-up visit in 3 weeks for evaluation before the next cycle of her treatment. The patient was advised to call immediately if she has any concerning symptoms in the interval. The patient voices understanding of current disease status and treatment options and is in agreement with the current care plan. All questions were answered. The patient knows to call the clinic with any problems, questions or concerns. We can certainly see the patient much sooner if necessary.  Disclaimer: This note was dictated with voice recognition software. Similar sounding words can inadvertently be transcribed and may not be corrected upon review.

## 2018-07-16 NOTE — Patient Instructions (Signed)
[facility] Discharge Instructions for Patients Receiving Chemotherapy  Today you received the following chemotherapy agents Pembrolizumab (KEYTRUDA).  To help prevent nausea and vomiting after your treatment, we encourage you to take your nausea medication as prescribed.   If you develop nausea and vomiting that is not controlled by your nausea medication, call the clinic.   BELOW ARE SYMPTOMS THAT SHOULD BE REPORTED IMMEDIATELY:  *FEVER GREATER THAN 100.5 F  *CHILLS WITH OR WITHOUT FEVER  NAUSEA AND VOMITING THAT IS NOT CONTROLLED WITH YOUR NAUSEA MEDICATION  *UNUSUAL SHORTNESS OF BREATH  *UNUSUAL BRUISING OR BLEEDING  TENDERNESS IN MOUTH AND THROAT WITH OR WITHOUT PRESENCE OF ULCERS  *URINARY PROBLEMS  *BOWEL PROBLEMS  UNUSUAL RASH Items with * indicate a potential emergency and should be followed up as soon as possible.  Feel free to call the clinic should you have any questions or concerns. The clinic phone number is [phone].  Please show the CHEMO ALERT CARD at check-in to the Emergency Department and triage nurse.  Coronavirus (COVID-19) Are you at risk?  Are you at risk for the Coronavirus (COVID-19)?  To be considered HIGH RISK for Coronavirus (COVID-19), you have to meet the following criteria:  . Traveled to [place], [place], [place], [place] or [place]; or in the [place] to [place], [place], [place], or [place]; and have fever, cough, and shortness of breath within the last 2 weeks of travel OR . Been in close contact with a person diagnosed with COVID-19 within the last 2 weeks and have fever, cough, and shortness of breath . IF YOU DO NOT MEET THESE CRITERIA, YOU ARE CONSIDERED LOW RISK FOR COVID-19.  What to do if you are HIGH RISK for COVID-19?  . If you are having a medical emergency, call 911. . Seek medical care right away. Before you go to a doctor's office, urgent care or emergency department, call ahead and tell  them about your recent travel, contact with someone diagnosed with COVID-19, and your symptoms. You should receive instructions from your physician's office regarding next steps of care.  . When you arrive at healthcare provider, tell the healthcare staff immediately you have returned from visiting [place], [place], [place], [place] or [place]; or traveled in the [place] to [place], [place], [place], or [place]; in the last two weeks or you have been in close contact with a person diagnosed with COVID-19 in the last 2 weeks.   . Tell the health care staff about your symptoms: fever, cough and shortness of breath. . After you have been seen by a medical provider, you will be either: o Tested for (COVID-19) and discharged home on quarantine except to seek medical care if symptoms worsen, and asked to  - Stay home and avoid contact with others until you get your results (4-5 days)  - Avoid travel on public transportation if possible (such as bus, train, or airplane) or o Sent to the Emergency Department by EMS for evaluation, COVID-19 testing, and possible admission depending on your condition and test results.  What to do if you are LOW RISK for COVID-19?  Reduce your risk of any infection by using the same precautions used for avoiding the common cold or flu:  . Wash your hands often with soap and warm water for at least 20 seconds.  If soap and water are not readily available, use an alcohol-based hand sanitizer with at least 60% alcohol.  . If coughing or   sneezing, cover your mouth and nose by coughing or sneezing into the elbow areas of your shirt or coat, into a tissue or into your sleeve (not your hands). . Avoid shaking hands with others and consider head nods or verbal greetings only. . Avoid touching your eyes, nose, or mouth with unwashed hands.  . Avoid close contact with people who are sick. . Avoid places or events with large numbers of people in one location, like concerts or  sporting events. . Carefully consider travel plans you have or are making. . If you are planning any travel outside or inside the [place], visit the CDC's Travelers' Health webpage for the latest health notices. . If you have some symptoms but not all symptoms, continue to monitor at home and seek medical attention if your symptoms worsen. . If you are having a medical emergency, call 911.   ADDITIONAL HEALTHCARE OPTIONS FOR PATIENTS  [facility] / e-Visit: [email or web address]         [facility]: [phone]  [facility]: [phone]                   [facility]: [phone]    

## 2018-07-17 ENCOUNTER — Telehealth: Payer: Self-pay | Admitting: Internal Medicine

## 2018-07-17 NOTE — Telephone Encounter (Signed)
Scheduled appt per 5/5 los - pt to get an updated schedule next visit.

## 2018-07-31 DIAGNOSIS — D0461 Carcinoma in situ of skin of right upper limb, including shoulder: Secondary | ICD-10-CM | POA: Diagnosis not present

## 2018-07-31 DIAGNOSIS — D485 Neoplasm of uncertain behavior of skin: Secondary | ICD-10-CM | POA: Diagnosis not present

## 2018-07-31 DIAGNOSIS — L820 Inflamed seborrheic keratosis: Secondary | ICD-10-CM | POA: Diagnosis not present

## 2018-08-06 ENCOUNTER — Other Ambulatory Visit: Payer: Self-pay

## 2018-08-06 ENCOUNTER — Encounter: Payer: Self-pay | Admitting: Physician Assistant

## 2018-08-06 ENCOUNTER — Inpatient Hospital Stay: Payer: Medicare Other

## 2018-08-06 ENCOUNTER — Inpatient Hospital Stay (HOSPITAL_BASED_OUTPATIENT_CLINIC_OR_DEPARTMENT_OTHER): Payer: Medicare Other | Admitting: Physician Assistant

## 2018-08-06 VITALS — BP 143/95 | HR 90 | Temp 98.2°F | Resp 17 | Ht 67.0 in | Wt 176.2 lb

## 2018-08-06 DIAGNOSIS — C3491 Malignant neoplasm of unspecified part of right bronchus or lung: Secondary | ICD-10-CM | POA: Diagnosis not present

## 2018-08-06 DIAGNOSIS — R05 Cough: Secondary | ICD-10-CM | POA: Diagnosis not present

## 2018-08-06 DIAGNOSIS — C382 Malignant neoplasm of posterior mediastinum: Secondary | ICD-10-CM

## 2018-08-06 DIAGNOSIS — Z79899 Other long term (current) drug therapy: Secondary | ICD-10-CM | POA: Diagnosis not present

## 2018-08-06 DIAGNOSIS — Z5112 Encounter for antineoplastic immunotherapy: Secondary | ICD-10-CM | POA: Diagnosis not present

## 2018-08-06 DIAGNOSIS — R0602 Shortness of breath: Secondary | ICD-10-CM | POA: Diagnosis not present

## 2018-08-06 DIAGNOSIS — Z9981 Dependence on supplemental oxygen: Secondary | ICD-10-CM

## 2018-08-06 DIAGNOSIS — R5383 Other fatigue: Secondary | ICD-10-CM

## 2018-08-06 LAB — CMP (CANCER CENTER ONLY)
ALT: 21 U/L (ref 0–44)
AST: 26 U/L (ref 15–41)
Albumin: 3 g/dL — ABNORMAL LOW (ref 3.5–5.0)
Alkaline Phosphatase: 133 U/L — ABNORMAL HIGH (ref 38–126)
Anion gap: 7 (ref 5–15)
BUN: 12 mg/dL (ref 8–23)
CO2: 31 mmol/L (ref 22–32)
Calcium: 9.3 mg/dL (ref 8.9–10.3)
Chloride: 103 mmol/L (ref 98–111)
Creatinine: 0.73 mg/dL (ref 0.44–1.00)
GFR, Est AFR Am: >60 mL/min (ref >60)
GFR, Estimated: >60 mL/min (ref >60)
Glucose, Bld: 86 mg/dL (ref 70–99)
Potassium: 4.1 mmol/L (ref 3.5–5.1)
Sodium: 141 mmol/L (ref 135–145)
Total Bilirubin: 0.4 mg/dL (ref 0.3–1.2)
Total Protein: 7.6 g/dL (ref 6.5–8.1)

## 2018-08-06 LAB — TSH: TSH: 3.773 u[IU]/mL (ref 0.308–3.960)

## 2018-08-06 LAB — CBC WITH DIFFERENTIAL (CANCER CENTER ONLY)
Abs Immature Granulocytes: 0.02 10*3/uL (ref 0.00–0.07)
Basophils Absolute: 0 10*3/uL (ref 0.0–0.1)
Basophils Relative: 1 %
Eosinophils Absolute: 0.1 10*3/uL (ref 0.0–0.5)
Eosinophils Relative: 2 %
HCT: 37.6 % (ref 36.0–46.0)
Hemoglobin: 11.7 g/dL — ABNORMAL LOW (ref 12.0–15.0)
Immature Granulocytes: 0 %
Lymphocytes Relative: 19 %
Lymphs Abs: 1.2 10*3/uL (ref 0.7–4.0)
MCH: 27 pg (ref 26.0–34.0)
MCHC: 31.1 g/dL (ref 30.0–36.0)
MCV: 86.8 fL (ref 80.0–100.0)
Monocytes Absolute: 0.6 10*3/uL (ref 0.1–1.0)
Monocytes Relative: 10 %
Neutro Abs: 4.4 10*3/uL (ref 1.7–7.7)
Neutrophils Relative %: 68 %
Platelet Count: 231 10*3/uL (ref 150–400)
RBC: 4.33 MIL/uL (ref 3.87–5.11)
RDW: 14.3 % (ref 11.5–15.5)
WBC Count: 6.4 10*3/uL (ref 4.0–10.5)
nRBC: 0 % (ref 0.0–0.2)

## 2018-08-06 MED ORDER — SODIUM CHLORIDE 0.9 % IV SOLN
200.0000 mg | Freq: Once | INTRAVENOUS | Status: AC
  Administered 2018-08-06: 15:00:00 200 mg via INTRAVENOUS
  Filled 2018-08-06: qty 8

## 2018-08-06 MED ORDER — SODIUM CHLORIDE 0.9 % IV SOLN
Freq: Once | INTRAVENOUS | Status: AC
  Administered 2018-08-06: 15:00:00 via INTRAVENOUS
  Filled 2018-08-06: qty 250

## 2018-08-06 NOTE — Progress Notes (Signed)
[facility] OFFICE PROGRESS NOTE  [name], MD [address] [place] [address]  DIAGNOSIS: Metastatic non-small cell carcinoma (T3, N2, M1a) non-small cell lung cancer diagnosed in November 2018 and presented with large right suprahilar and mediastinal mass as well as recent right pleural effusion.  Guardant 360:No actionable mutations.  PRIOR THERAPY:  1) Palliative radiotherapy to the large mediastinal mass under the care of Dr. Lisbeth Renshaw. 2) Systemic chemotherapy with carboplatin for AUC of 5, paclitaxel 175 mg/M2 and Keytruda 200 mg IV every 3 weeks. First dose expected May 01, 2017. Status post 3 cycles.  CURRENT THERAPY: Maintenance treatment with immunotherapy with single agent Keytruda 200 mg IV every 3 weeks. First cycle 07/24/2017. Status post 21cycles.  INTERVAL HISTORY: Cassidy Sanders 78 y.o. female returns to the clinic for a follow-up visit.  The patient is feeling well today without any concerning complaints.  She continues to tolerate her treatment well without any adverse effects.  She denies any fever, chills, night sweats, or weight loss.  She denies any chest pain or hemoptysis.  She reports to her baseline shortness of breath and occasional productive cough. She is occasionally on 2L of home oxygen. She is not using oxygen today. She denies any nausea, vomiting, diarrhea, or constipation.  She denies any headache or visual changes.  She denies any rashes or skin changes.  She is here today for evaluation before starting cycle #22.  MEDICAL HISTORY: Past Medical History:  Diagnosis Date  . Arrhythmia   . Atrial fibrillation ([place])   . Brain tumor (benign) ([place])   . Depression   . Dyspnea    home oxygen  . Dysrhythmia    a fib  . History of home oxygen therapy 02/2017   2 L/minute  . Hypertension     ALLERGIES:  is allergic to latex; gabapentin; and tape.  MEDICATIONS:  Current Outpatient Medications  Medication Sig Dispense  Refill  . albuterol (PROVENTIL) (2.5 MG/3ML) 0.083% nebulizer solution VVN Q 4 H PRF WHEEZING OR SHORTNESS OF BREATH 360 mL 2  . Biotin 2.5 MG TABS Take 2,500 mcg by mouth daily.    . clobetasol cream (TEMOVATE) 1.30 % Apply 1 application topically 2 (two) times daily. 60 g 0  . diphenhydrAMINE (BENADRYL) 25 mg capsule Take 1 capsule (25 mg total) by mouth at bedtime as needed for sleep. 30 capsule 0  . feeding supplement, ENSURE ENLIVE, (ENSURE ENLIVE) LIQD Take 237 mLs by mouth 2 (two) times daily as needed (If patient or family requests, if pt unable to eat for a meal, or if intakes are poor). 237 mL 12  . hydrOXYzine (ATARAX/VISTARIL) 10 MG tablet Take 1 tablet (10 mg total) by mouth 3 (three) times daily as needed for itching. 30 tablet 1  . levalbuterol (XOPENEX) 0.63 MG/3ML nebulizer solution Take 3 mLs (0.63 mg total) by nebulization every 6 (six) hours as needed for wheezing or shortness of breath. 3 mL 12  . levothyroxine (SYNTHROID, LEVOTHROID) 100 MCG tablet TAKE 1 TABLET BY MOUTH EVERY DAY BEFORE BREAKFAST 90 tablet 1  . loperamide (IMODIUM) 2 MG capsule Take 2 mg by mouth as needed for diarrhea or loose stools.    . metoprolol tartrate (LOPRESSOR) 25 MG tablet Take 25 mg by mouth daily.     Marland Kitchen oxyCODONE (OXY IR/ROXICODONE) 5 MG immediate release tablet Take 1 tablet by mouth every 2 (two) hours as needed.   0  . Respiratory Therapy Supplies (FLUTTER) DEVI Use as directed.  1 each 0  . sertraline (ZOLOFT) 50 MG tablet Take 50 mg by mouth daily.     No current facility-administered medications for this visit.     SURGICAL HISTORY:  Past Surgical History:  Procedure Laterality Date  . ABDOMINAL HYSTERECTOMY    . BLADDER SURGERY    . BRAIN SURGERY    . ENDOBRONCHIAL ULTRASOUND Bilateral 02/06/2017   Procedure: ENDOBRONCHIAL ULTRASOUND;  Surgeon: Juanito Doom, MD;  Location: [facility];  Service: Cardiopulmonary;  Laterality: Bilateral;  . IR GUIDED DRAIN W CATHETER PLACEMENT   06/29/2017  . IR REMOVAL OF PLURAL CATH W/CUFF  08/28/2017  . IR US GUIDE BX ASP/DRAIN  06/29/2017  . VIDEO BRONCHOSCOPY WITH ENDOBRONCHIAL ULTRASOUND N/A 03/23/2017   Procedure: VIDEO BRONCHOSCOPY WITH ENDOBRONCHIAL ULTRASOUND;  Surgeon: Juanito Doom, MD;  Location: [facility];  Service: Thoracic;  Laterality: N/A;    REVIEW OF SYSTEMS:   Review of Systems  Constitutional: Negative for appetite change, chills, fatigue, fever and unexpected weight change.  HENT:   Negative for mouth sores, nosebleeds, sore throat and trouble swallowing.   Eyes: Negative for eye problems and icterus.  Respiratory: Positive for baseline productive cough and shortness of breath. Negative for hemoptysis and wheezing.   Cardiovascular: Negative for chest pain and leg swelling.  Gastrointestinal: Negative for abdominal pain, constipation, diarrhea, nausea and vomiting.  Genitourinary: Negative for bladder incontinence, difficulty urinating, dysuria, frequency and hematuria.   Musculoskeletal: Negative for back pain, gait problem, neck pain and neck stiffness.  Skin: Negative for itching and rash.  Neurological: Negative for dizziness, extremity weakness, gait problem, headaches, light-headedness and seizures.  Hematological: Negative for adenopathy. Does not bruise/bleed easily.  Psychiatric/Behavioral: Negative for confusion, depression and sleep disturbance. The patient is not nervous/anxious.     PHYSICAL EXAMINATION:  Blood pressure (!) 143/95, pulse 90, temperature 98.2 F (36.8 C), temperature source Oral, resp. rate 17, height 5\' 7"  (1.702 m), weight 176 lb 3.2 oz (79.9 kg), SpO2 94 %.  ECOG PERFORMANCE STATUS: 1 - Symptomatic but completely ambulatory  Physical Exam  Constitutional: Oriented to person, place, and time and well-developed, well-nourished, and in no distress.  HENT:  Head: Normocephalic and atraumatic.  Mouth/Throat: Oropharynx is clear and moist. No oropharyngeal exudate.  Eyes:  Conjunctivae are normal. Right eye exhibits no discharge. Left eye exhibits no discharge. No scleral icterus.  Neck: Normal range of motion. Neck supple.  Cardiovascular: Normal rate, regular rhythm, normal heart sounds and intact distal pulses.   Pulmonary/Chest: Effort normal and breath sounds normal. No respiratory distress. No wheezes. No rales.  Abdominal: Soft. Bowel sounds are normal. Exhibits no distension and no mass. There is no tenderness.  Musculoskeletal: Normal range of motion. Exhibits no edema.  Lymphadenopathy:    No cervical adenopathy.  Neurological: Alert and oriented to person, place, and time. Exhibits normal muscle tone. Gait normal. Coordination normal.  Skin: Skin is warm and dry. No rash noted. Not diaphoretic. No erythema. No pallor.  Psychiatric: Mood, memory and judgment normal.  Vitals reviewed.  LABORATORY DATA: Lab Results  Component Value Date   WBC 6.4 08/06/2018   HGB 11.7 (L) 08/06/2018   HCT 37.6 08/06/2018   MCV 86.8 08/06/2018   PLT 231 08/06/2018      Chemistry      Component Value Date/Time   NA 141 08/06/2018 1301   NA 143 03/15/2017 1436   K 4.1 08/06/2018 1301   K 3.7 03/15/2017 1436   CL 103 08/06/2018 1301  CO2 31 08/06/2018 1301   CO2 34 (H) 03/15/2017 1436   BUN 12 08/06/2018 1301   BUN 17.8 03/15/2017 1436   CREATININE 0.73 08/06/2018 1301   CREATININE 0.8 03/15/2017 1436      Component Value Date/Time   CALCIUM 9.3 08/06/2018 1301   CALCIUM 9.6 03/15/2017 1436   ALKPHOS 133 (H) 08/06/2018 1301   ALKPHOS 88 03/15/2017 1436   AST 26 08/06/2018 1301   AST 16 03/15/2017 1436   ALT 21 08/06/2018 1301   ALT 15 03/15/2017 1436   BILITOT 0.4 08/06/2018 1301   BILITOT 0.52 03/15/2017 1436       RADIOGRAPHIC STUDIES:  No results found.   ASSESSMENT/PLAN:  This is a very pleasant 78 year old Caucasian female diagnosed with probable stage IV non-small cell lung cancer.  She presented with a large right suprahilar lung  mass in addition to mediastinal lymphadenopathy and a suspicious malignant right pleural effusion.   The patient underwent systemic chemotherapy with carboplatin, paclitaxel and Keytruda.  She is status post 3 cycles.  During the last cycle of treatment, she developed C. difficile, dehydration, and renal insufficiency.  He is currently undergoing immunotherapy with Keytruda 200 mg IV every 3 weeks.  She is status post 21 cycles.  She is tolerating treatment well without any adverse effects.   The patient was seen with Dr. Julien Nordmann today.  Labs were reviewed.  We recommend that she proceed with cycle #22 today scheduled. We will restage her disease with a CT scan every 4 treatments which will be after cycle #23.   I will see her back for follow-up visit in 2 weeks for evaluation before starting cycle #23.  The patient was advised to call immediately if she has any concerning symptoms in the interval. The patient voices understanding of current disease status and treatment options and is in agreement with the current care plan. All questions were answered. The patient knows to call the clinic with any problems, questions or concerns. We can certainly see the patient much sooner if necessary  No orders of the defined types were placed in this encounter.    Cassidy Pereyra L Analyah Mcconnon, PA-C 08/06/18  ADDENDUM: Hematology/Oncology Attending: I had a face-to-face encounter with the patient today.  I recommended her care plan.  This is a very pleasant 78 years old white female with stage IV non-small cell lung cancer.  She is currently undergoing treatment with immunotherapy with Keytruda every 3 weeks status post 21 cycles.  The patient has been tolerating this treatment well with no concerning adverse effects. I recommended for her to proceed with cycle #22 today as planned. I will see her back for follow-up visit in 3 weeks for evaluation before starting cycle #23. The patient was advised to call  immediately if she has any concerning symptoms in the interval.  Disclaimer: This note was dictated with voice recognition software. Similar sounding words can inadvertently be transcribed and may be missed upon review. [name], MD 08/06/18

## 2018-08-14 DIAGNOSIS — D0461 Carcinoma in situ of skin of right upper limb, including shoulder: Secondary | ICD-10-CM | POA: Diagnosis not present

## 2018-08-27 ENCOUNTER — Encounter: Payer: Self-pay | Admitting: Internal Medicine

## 2018-08-27 ENCOUNTER — Inpatient Hospital Stay: Payer: Medicare Other

## 2018-08-27 ENCOUNTER — Inpatient Hospital Stay: Payer: Medicare Other | Attending: Internal Medicine

## 2018-08-27 ENCOUNTER — Other Ambulatory Visit: Payer: Self-pay

## 2018-08-27 ENCOUNTER — Inpatient Hospital Stay (HOSPITAL_BASED_OUTPATIENT_CLINIC_OR_DEPARTMENT_OTHER): Payer: Medicare Other | Admitting: Internal Medicine

## 2018-08-27 VITALS — BP 122/69 | HR 95 | Temp 98.5°F | Resp 18 | Ht 67.0 in | Wt 176.0 lb

## 2018-08-27 DIAGNOSIS — Z9981 Dependence on supplemental oxygen: Secondary | ICD-10-CM | POA: Diagnosis not present

## 2018-08-27 DIAGNOSIS — R5383 Other fatigue: Secondary | ICD-10-CM

## 2018-08-27 DIAGNOSIS — C3491 Malignant neoplasm of unspecified part of right bronchus or lung: Secondary | ICD-10-CM | POA: Insufficient documentation

## 2018-08-27 DIAGNOSIS — Z5112 Encounter for antineoplastic immunotherapy: Secondary | ICD-10-CM | POA: Diagnosis not present

## 2018-08-27 DIAGNOSIS — C382 Malignant neoplasm of posterior mediastinum: Secondary | ICD-10-CM

## 2018-08-27 DIAGNOSIS — R0602 Shortness of breath: Secondary | ICD-10-CM | POA: Diagnosis not present

## 2018-08-27 DIAGNOSIS — E039 Hypothyroidism, unspecified: Secondary | ICD-10-CM | POA: Insufficient documentation

## 2018-08-27 DIAGNOSIS — Z79899 Other long term (current) drug therapy: Secondary | ICD-10-CM | POA: Diagnosis not present

## 2018-08-27 DIAGNOSIS — C3490 Malignant neoplasm of unspecified part of unspecified bronchus or lung: Secondary | ICD-10-CM

## 2018-08-27 DIAGNOSIS — I10 Essential (primary) hypertension: Secondary | ICD-10-CM

## 2018-08-27 LAB — CMP (CANCER CENTER ONLY)
ALT: 18 U/L (ref 0–44)
AST: 23 U/L (ref 15–41)
Albumin: 3.1 g/dL — ABNORMAL LOW (ref 3.5–5.0)
Alkaline Phosphatase: 130 U/L — ABNORMAL HIGH (ref 38–126)
Anion gap: 10 (ref 5–15)
BUN: 17 mg/dL (ref 8–23)
CO2: 27 mmol/L (ref 22–32)
Calcium: 9 mg/dL (ref 8.9–10.3)
Chloride: 105 mmol/L (ref 98–111)
Creatinine: 0.91 mg/dL (ref 0.44–1.00)
GFR, Est AFR Am: >60 mL/min (ref >60)
GFR, Estimated: >60 mL/min (ref >60)
Glucose, Bld: 113 mg/dL — ABNORMAL HIGH (ref 70–99)
Potassium: 3.8 mmol/L (ref 3.5–5.1)
Sodium: 142 mmol/L (ref 135–145)
Total Bilirubin: 0.5 mg/dL (ref 0.3–1.2)
Total Protein: 7.4 g/dL (ref 6.5–8.1)

## 2018-08-27 LAB — CBC WITH DIFFERENTIAL (CANCER CENTER ONLY)
Abs Immature Granulocytes: 0.03 10*3/uL (ref 0.00–0.07)
Basophils Absolute: 0 10*3/uL (ref 0.0–0.1)
Basophils Relative: 1 %
Eosinophils Absolute: 0.1 10*3/uL (ref 0.0–0.5)
Eosinophils Relative: 2 %
HCT: 36.9 % (ref 36.0–46.0)
Hemoglobin: 11.5 g/dL — ABNORMAL LOW (ref 12.0–15.0)
Immature Granulocytes: 1 %
Lymphocytes Relative: 16 %
Lymphs Abs: 1 10*3/uL (ref 0.7–4.0)
MCH: 27.3 pg (ref 26.0–34.0)
MCHC: 31.2 g/dL (ref 30.0–36.0)
MCV: 87.4 fL (ref 80.0–100.0)
Monocytes Absolute: 0.5 10*3/uL (ref 0.1–1.0)
Monocytes Relative: 9 %
Neutro Abs: 4.4 10*3/uL (ref 1.7–7.7)
Neutrophils Relative %: 71 %
Platelet Count: 226 10*3/uL (ref 150–400)
RBC: 4.22 MIL/uL (ref 3.87–5.11)
RDW: 14.8 % (ref 11.5–15.5)
WBC Count: 6.1 10*3/uL (ref 4.0–10.5)
nRBC: 0 % (ref 0.0–0.2)

## 2018-08-27 LAB — TSH: TSH: 3.216 u[IU]/mL (ref 0.308–3.960)

## 2018-08-27 MED ORDER — SODIUM CHLORIDE 0.9 % IV SOLN
200.0000 mg | Freq: Once | INTRAVENOUS | Status: AC
  Administered 2018-08-27: 200 mg via INTRAVENOUS
  Filled 2018-08-27: qty 8

## 2018-08-27 MED ORDER — SODIUM CHLORIDE 0.9 % IV SOLN
Freq: Once | INTRAVENOUS | Status: AC
  Administered 2018-08-27: 15:00:00 via INTRAVENOUS
  Filled 2018-08-27: qty 250

## 2018-08-27 MED ORDER — LEVOTHYROXINE SODIUM 75 MCG PO TABS: 75.0000 ug | ORAL_TABLET | Freq: Every day | ORAL | 2 refills | Status: DC

## 2018-08-27 NOTE — Patient Instructions (Signed)
[facility] Discharge Instructions for Patients Receiving Chemotherapy  Today you received the following chemotherapy agents Pembrolizumab [facility]).  To help prevent nausea and vomiting after your treatment, we encourage you to take your nausea medication as prescribed.   If you develop nausea and vomiting that is not controlled by your nausea medication, call the clinic.   BELOW ARE SYMPTOMS THAT SHOULD BE REPORTED IMMEDIATELY:  *FEVER GREATER THAN 100.5 F  *CHILLS WITH OR WITHOUT FEVER  NAUSEA AND VOMITING THAT IS NOT CONTROLLED WITH YOUR NAUSEA MEDICATION  *UNUSUAL SHORTNESS OF BREATH  *UNUSUAL BRUISING OR BLEEDING  TENDERNESS IN MOUTH AND THROAT WITH OR WITHOUT PRESENCE OF ULCERS  *URINARY PROBLEMS  *BOWEL PROBLEMS  UNUSUAL RASH Items with * indicate a potential emergency and should be followed up as soon as possible.  Feel free to call the clinic should you have any questions or concerns. The clinic phone number is [phone].  Please show the [facility] at check-in to the Emergency Department and triage nurse.

## 2018-08-27 NOTE — Progress Notes (Signed)
[facility] [phone]   [phone]  OFFICE PROGRESS NOTE  [name], MD [address] [place] [address]  DIAGNOSIS: Metastatic non-small cell carcinoma (T3, N2, M1a) non-small cell lung cancer diagnosed in November 2018 and presented with large right suprahilar and mediastinal mass as well as recent right pleural effusion.  Guardant 360: No actionable mutations.  PRIOR THERAPY:  1) Palliative radiotherapy to the large mediastinal mass under the care of Dr. Lisbeth Renshaw. 2) Systemic chemotherapy with carboplatin for AUC of 5, paclitaxel 175 mg/M2 and Keytruda 200 mg IV every 3 weeks.  First dose expected May 01, 2017.  Status post 3 cycles.  CURRENT THERAPY: Maintenance treatment with immunotherapy with single agent Keytruda 200 mg IV every 3 weeks.  First cycle 07/24/2017.  Status post 22 cycles.  INTERVAL HISTORY: Cassidy Sanders 78 y.o. female returns to the clinic today for follow-up visit.  The patient is feeling fine today with no concerning complaints except for the baseline shortness of breath and she is currently on home oxygen.  She denied having any fever or chills.  She has no nausea, vomiting, diarrhea or constipation.  She denied having any headache or visual changes.  She would like her dose of levothyroxine to be reduced back to 75 because she is eating too much.  She is here today for evaluation before starting cycle #23.  MEDICAL HISTORY: Past Medical History:  Diagnosis Date   Arrhythmia    Atrial fibrillation ([place])    Brain tumor (benign) ([place])    Depression    Dyspnea    home oxygen   Dysrhythmia    a fib   History of home oxygen therapy 02/2017   2 L/minute   Hypertension     ALLERGIES:  is allergic to latex; gabapentin; and tape.  MEDICATIONS:  Current Outpatient Medications  Medication Sig Dispense Refill   albuterol (PROVENTIL) (2.5 MG/3ML) 0.083% nebulizer solution VVN Q 4 H PRF WHEEZING OR  SHORTNESS OF BREATH 360 mL 2   Biotin 2.5 MG TABS Take 2,500 mcg by mouth daily.     clobetasol cream (TEMOVATE) 0.94 % Apply 1 application topically 2 (two) times daily. 60 g 0   diphenhydrAMINE (BENADRYL) 25 mg capsule Take 1 capsule (25 mg total) by mouth at bedtime as needed for sleep. 30 capsule 0   feeding supplement, ENSURE ENLIVE, (ENSURE ENLIVE) LIQD Take 237 mLs by mouth 2 (two) times daily as needed (If patient or family requests, if pt unable to eat for a meal, or if intakes are poor). 237 mL 12   hydrOXYzine (ATARAX/VISTARIL) 10 MG tablet Take 1 tablet (10 mg total) by mouth 3 (three) times daily as needed for itching. 30 tablet 1   levalbuterol (XOPENEX) 0.63 MG/3ML nebulizer solution Take 3 mLs (0.63 mg total) by nebulization every 6 (six) hours as needed for wheezing or shortness of breath. 3 mL 12   levothyroxine (SYNTHROID, LEVOTHROID) 100 MCG tablet TAKE 1 TABLET BY MOUTH EVERY DAY BEFORE BREAKFAST 90 tablet 1   loperamide (IMODIUM) 2 MG capsule Take 2 mg by mouth as needed for diarrhea or loose stools.     metoprolol tartrate (LOPRESSOR) 25 MG tablet Take 25 mg by mouth daily.      oxyCODONE (OXY IR/ROXICODONE) 5 MG immediate release tablet Take 1 tablet by mouth every 2 (two) hours as needed.   0   Respiratory Therapy Supplies (FLUTTER) DEVI Use as directed. 1 each 0  sertraline (ZOLOFT) 50 MG tablet Take 50 mg by mouth daily.     No current facility-administered medications for this visit.     SURGICAL HISTORY:  Past Surgical History:  Procedure Laterality Date   ABDOMINAL HYSTERECTOMY     BLADDER SURGERY     BRAIN SURGERY     ENDOBRONCHIAL ULTRASOUND Bilateral 02/06/2017   Procedure: ENDOBRONCHIAL ULTRASOUND;  Surgeon: Juanito Doom, MD;  Location: [facility];  Service: Cardiopulmonary;  Laterality: Bilateral;   IR GUIDED DRAIN W CATHETER PLACEMENT  06/29/2017   IR REMOVAL OF PLURAL CATH W/CUFF  08/28/2017   IR US GUIDE BX ASP/DRAIN   06/29/2017   VIDEO BRONCHOSCOPY WITH ENDOBRONCHIAL ULTRASOUND N/A 03/23/2017   Procedure: VIDEO BRONCHOSCOPY WITH ENDOBRONCHIAL ULTRASOUND;  Surgeon: Juanito Doom, MD;  Location: [facility];  Service: Thoracic;  Laterality: N/A;    REVIEW OF SYSTEMS:  A comprehensive review of systems was negative except for: Respiratory: positive for dyspnea on exertion   PHYSICAL EXAMINATION: General appearance: alert, cooperative and no distress Head: Normocephalic, without obvious abnormality, atraumatic Neck: no adenopathy, no JVD, supple, symmetrical, trachea midline and thyroid not enlarged, symmetric, no tenderness/mass/nodules Lymph nodes: Cervical, supraclavicular, and axillary nodes normal. Resp: clear to auscultation bilaterally Back: symmetric, no curvature. ROM normal. No CVA tenderness. Cardio: regular rate and rhythm, S1, S2 normal, no murmur, click, rub or gallop GI: soft, non-tender; bowel sounds normal; no masses,  no organomegaly Extremities: extremities normal, atraumatic, no cyanosis or edema  ECOG PERFORMANCE STATUS: 1 - Symptomatic but completely ambulatory  Blood pressure 122/69, pulse 95, temperature 98.5 F (36.9 C), temperature source Oral, resp. rate 18, height 5\' 7"  (1.702 m), weight 176 lb (79.8 kg), SpO2 93 %.  LABORATORY DATA: Lab Results  Component Value Date   WBC 6.1 08/27/2018   HGB 11.5 (L) 08/27/2018   HCT 36.9 08/27/2018   MCV 87.4 08/27/2018   PLT 226 08/27/2018      Chemistry      Component Value Date/Time   NA 141 08/06/2018 1301   NA 143 03/15/2017 1436   K 4.1 08/06/2018 1301   K 3.7 03/15/2017 1436   CL 103 08/06/2018 1301   CO2 31 08/06/2018 1301   CO2 34 (H) 03/15/2017 1436   BUN 12 08/06/2018 1301   BUN 17.8 03/15/2017 1436   CREATININE 0.73 08/06/2018 1301   CREATININE 0.8 03/15/2017 1436      Component Value Date/Time   CALCIUM 9.3 08/06/2018 1301   CALCIUM 9.6 03/15/2017 1436   ALKPHOS 133 (H) 08/06/2018 1301   ALKPHOS 88  03/15/2017 1436   AST 26 08/06/2018 1301   AST 16 03/15/2017 1436   ALT 21 08/06/2018 1301   ALT 15 03/15/2017 1436   BILITOT 0.4 08/06/2018 1301   BILITOT 0.52 03/15/2017 1436       RADIOGRAPHIC STUDIES: No results found.  ASSESSMENT AND PLAN: This is a very pleasant 78 years old white female recently diagnosed with probably stage IV non-small cell lung cancer presented with large right suprahilar lung mass in addition to mediastinal lymphadenopathy and suspicious malignant right pleural effusion.  The patient underwent systemic chemotherapy with carboplatin, paclitaxel and Keytruda status post 3 cycles.  She was tolerating this treatment well with no concerning complaints except for the last cycle when she was admitted to the hospital with C. difficile as well as dehydration and renal insufficiency.   She is currently on treatment with single agent Ketruda (pembrolizumab) status post 22 cycles.  The patient  continues to tolerate her treatment well with no concerning adverse effects. I recommended for her to proceed with cycle #23 today as planned. I will see her back for follow-up visit in 3 weeks for evaluation with repeat CT scan of the chest, abdomen and pelvis for restaging of her disease. For the hypothyroidism, I will give her a refill of levothyroxine at 75 mcg p.o. daily based on her request but we will continue to monitor her TSH closely for adjustment of her medication. The patient voices understanding of current disease status and treatment options and is in agreement with the current care plan. All questions were answered. The patient knows to call the clinic with any problems, questions or concerns. We can certainly see the patient much sooner if necessary.  Disclaimer: This note was dictated with voice recognition software. Similar sounding words can inadvertently be transcribed and may not be corrected upon review.

## 2018-08-28 DIAGNOSIS — L820 Inflamed seborrheic keratosis: Secondary | ICD-10-CM | POA: Diagnosis not present

## 2018-08-28 DIAGNOSIS — C44622 Squamous cell carcinoma of skin of right upper limb, including shoulder: Secondary | ICD-10-CM | POA: Diagnosis not present

## 2018-08-29 ENCOUNTER — Telehealth: Payer: Self-pay | Admitting: Internal Medicine

## 2018-08-29 NOTE — Telephone Encounter (Signed)
Scheduled appts per los. Mailed printout

## 2018-08-30 ENCOUNTER — Telehealth: Payer: Self-pay | Admitting: Internal Medicine

## 2018-08-30 NOTE — Telephone Encounter (Signed)
Moved 7/6 lab to 9:30 to coordinate with ct per 6/19 schedule message. Confirmed with patient.

## 2018-09-10 ENCOUNTER — Ambulatory Visit: Payer: Medicare Other

## 2018-09-10 ENCOUNTER — Ambulatory Visit: Payer: Medicare Other | Admitting: Physician Assistant

## 2018-09-10 ENCOUNTER — Other Ambulatory Visit: Payer: Medicare Other

## 2018-09-16 ENCOUNTER — Other Ambulatory Visit: Payer: Self-pay

## 2018-09-16 ENCOUNTER — Inpatient Hospital Stay: Payer: Medicare Other | Attending: Internal Medicine

## 2018-09-16 ENCOUNTER — Ambulatory Visit (HOSPITAL_COMMUNITY)
Admission: RE | Admit: 2018-09-16 | Discharge: 2018-09-16 | Disposition: A | Discharge status: Home / Self Care | Payer: Medicare Other | Source: Ambulatory Visit | Attending: Internal Medicine | Admitting: Internal Medicine

## 2018-09-16 DIAGNOSIS — E039 Hypothyroidism, unspecified: Secondary | ICD-10-CM | POA: Diagnosis not present

## 2018-09-16 DIAGNOSIS — C3491 Malignant neoplasm of unspecified part of right bronchus or lung: Secondary | ICD-10-CM | POA: Insufficient documentation

## 2018-09-16 DIAGNOSIS — Z5112 Encounter for antineoplastic immunotherapy: Secondary | ICD-10-CM | POA: Insufficient documentation

## 2018-09-16 DIAGNOSIS — K8020 Calculus of gallbladder without cholecystitis without obstruction: Secondary | ICD-10-CM | POA: Diagnosis not present

## 2018-09-16 DIAGNOSIS — R5383 Other fatigue: Secondary | ICD-10-CM

## 2018-09-16 DIAGNOSIS — C3490 Malignant neoplasm of unspecified part of unspecified bronchus or lung: Secondary | ICD-10-CM | POA: Diagnosis not present

## 2018-09-16 LAB — CMP (CANCER CENTER ONLY)
ALT: 25 U/L (ref 0–44)
AST: 28 U/L (ref 15–41)
Albumin: 3.3 g/dL — ABNORMAL LOW (ref 3.5–5.0)
Alkaline Phosphatase: 127 U/L — ABNORMAL HIGH (ref 38–126)
Anion gap: 10 (ref 5–15)
BUN: 14 mg/dL (ref 8–23)
CO2: 25 mmol/L (ref 22–32)
Calcium: 9.1 mg/dL (ref 8.9–10.3)
Chloride: 107 mmol/L (ref 98–111)
Creatinine: 0.75 mg/dL (ref 0.44–1.00)
GFR, Est AFR Am: >60 mL/min (ref >60)
GFR, Estimated: >60 mL/min (ref >60)
Glucose, Bld: 94 mg/dL (ref 70–99)
Potassium: 3.8 mmol/L (ref 3.5–5.1)
Sodium: 142 mmol/L (ref 135–145)
Total Bilirubin: 0.6 mg/dL (ref 0.3–1.2)
Total Protein: 7.8 g/dL (ref 6.5–8.1)

## 2018-09-16 LAB — CBC WITH DIFFERENTIAL (CANCER CENTER ONLY)
Abs Immature Granulocytes: 0.01 10*3/uL (ref 0.00–0.07)
Basophils Absolute: 0 10*3/uL (ref 0.0–0.1)
Basophils Relative: 1 %
Eosinophils Absolute: 0.1 10*3/uL (ref 0.0–0.5)
Eosinophils Relative: 1 %
HCT: 40.9 % (ref 36.0–46.0)
Hemoglobin: 13.1 g/dL (ref 12.0–15.0)
Immature Granulocytes: 0 %
Lymphocytes Relative: 22 %
Lymphs Abs: 1.3 10*3/uL (ref 0.7–4.0)
MCH: 27.1 pg (ref 26.0–34.0)
MCHC: 32 g/dL (ref 30.0–36.0)
MCV: 84.5 fL (ref 80.0–100.0)
Monocytes Absolute: 0.5 10*3/uL (ref 0.1–1.0)
Monocytes Relative: 8 %
Neutro Abs: 4.1 10*3/uL (ref 1.7–7.7)
Neutrophils Relative %: 68 %
Platelet Count: 216 10*3/uL (ref 150–400)
RBC: 4.84 MIL/uL (ref 3.87–5.11)
RDW: 14.7 % (ref 11.5–15.5)
WBC Count: 6 10*3/uL (ref 4.0–10.5)
nRBC: 0 % (ref 0.0–0.2)

## 2018-09-16 LAB — TSH: TSH: 2.992 u[IU]/mL (ref 0.308–3.960)

## 2018-09-16 MED ORDER — IOHEXOL 300 MG/ML  SOLN
100.0000 mL | Freq: Once | INTRAMUSCULAR | Status: AC | PRN
  Administered 2018-09-16: 11:00:00 100 mL via INTRAVENOUS

## 2018-09-16 MED ORDER — SODIUM CHLORIDE (PF) 0.9 % IJ SOLN
INTRAMUSCULAR | Status: AC
  Filled 2018-09-16: qty 50

## 2018-09-17 ENCOUNTER — Inpatient Hospital Stay (HOSPITAL_BASED_OUTPATIENT_CLINIC_OR_DEPARTMENT_OTHER): Payer: Medicare Other | Admitting: Internal Medicine

## 2018-09-17 ENCOUNTER — Encounter: Payer: Self-pay | Admitting: Internal Medicine

## 2018-09-17 ENCOUNTER — Inpatient Hospital Stay: Payer: Medicare Other

## 2018-09-17 ENCOUNTER — Other Ambulatory Visit: Payer: Self-pay

## 2018-09-17 ENCOUNTER — Other Ambulatory Visit: Payer: Medicare Other

## 2018-09-17 VITALS — BP 138/82 | HR 87 | Temp 98.3°F | Resp 18 | Ht 67.0 in | Wt 174.7 lb

## 2018-09-17 DIAGNOSIS — C382 Malignant neoplasm of posterior mediastinum: Secondary | ICD-10-CM

## 2018-09-17 DIAGNOSIS — C3491 Malignant neoplasm of unspecified part of right bronchus or lung: Secondary | ICD-10-CM | POA: Diagnosis not present

## 2018-09-17 DIAGNOSIS — E039 Hypothyroidism, unspecified: Secondary | ICD-10-CM

## 2018-09-17 DIAGNOSIS — I10 Essential (primary) hypertension: Secondary | ICD-10-CM

## 2018-09-17 DIAGNOSIS — Z5112 Encounter for antineoplastic immunotherapy: Secondary | ICD-10-CM | POA: Diagnosis not present

## 2018-09-17 DIAGNOSIS — R911 Solitary pulmonary nodule: Secondary | ICD-10-CM | POA: Diagnosis not present

## 2018-09-17 MED ORDER — SODIUM CHLORIDE 0.9 % IV SOLN
200.0000 mg | Freq: Once | INTRAVENOUS | Status: AC
  Administered 2018-09-17: 200 mg via INTRAVENOUS
  Filled 2018-09-17: qty 8

## 2018-09-17 MED ORDER — SODIUM CHLORIDE 0.9 % IV SOLN
Freq: Once | INTRAVENOUS | Status: AC
  Administered 2018-09-17: 13:00:00 via INTRAVENOUS
  Filled 2018-09-17: qty 250

## 2018-09-17 NOTE — Patient Instructions (Addendum)
[facility] Discharge Instructions for Patients Receiving Chemotherapy  Today you received the following chemotherapy agents Pembrolizumab (KEYTRUDA).  To help prevent nausea and vomiting after your treatment, we encourage you to take your nausea medication as prescribed.   If you develop nausea and vomiting that is not controlled by your nausea medication, call the clinic.   BELOW ARE SYMPTOMS THAT SHOULD BE REPORTED IMMEDIATELY:  *FEVER GREATER THAN 100.5 F  *CHILLS WITH OR WITHOUT FEVER  NAUSEA AND VOMITING THAT IS NOT CONTROLLED WITH YOUR NAUSEA MEDICATION  *UNUSUAL SHORTNESS OF BREATH  *UNUSUAL BRUISING OR BLEEDING  TENDERNESS IN MOUTH AND THROAT WITH OR WITHOUT PRESENCE OF ULCERS  *URINARY PROBLEMS  *BOWEL PROBLEMS  UNUSUAL RASH Items with * indicate a potential emergency and should be followed up as soon as possible.  Feel free to call the clinic should you have any questions or concerns. The clinic phone number is [phone].  Please show the CHEMO ALERT CARD at check-in to the Emergency Department and triage nurse.  Coronavirus (COVID-19) Are you at risk?  Are you at risk for the Coronavirus (COVID-19)?  To be considered HIGH RISK for Coronavirus (COVID-19), you have to meet the following criteria:  . Traveled to [place], [place], [place], [place] or [place]; or in the [place] to [place], [place], [place], or [place]; and have fever, cough, and shortness of breath within the last 2 weeks of travel OR . Been in close contact with a person diagnosed with COVID-19 within the last 2 weeks and have fever, cough, and shortness of breath . IF YOU DO NOT MEET THESE CRITERIA, YOU ARE CONSIDERED LOW RISK FOR COVID-19.  What to do if you are HIGH RISK for COVID-19?  . If you are having a medical emergency, call 911. . Seek medical care right away. Before you go to a doctor's office, urgent care or emergency department, call ahead and tell  them about your recent travel, contact with someone diagnosed with COVID-19, and your symptoms. You should receive instructions from your physician's office regarding next steps of care.  . When you arrive at healthcare provider, tell the healthcare staff immediately you have returned from visiting [place], [place], [place], [place] or [place]; or traveled in the [place] to [place], [place], [place], or [place]; in the last two weeks or you have been in close contact with a person diagnosed with COVID-19 in the last 2 weeks.   . Tell the health care staff about your symptoms: fever, cough and shortness of breath. . After you have been seen by a medical provider, you will be either: o Tested for (COVID-19) and discharged home on quarantine except to seek medical care if symptoms worsen, and asked to  - Stay home and avoid contact with others until you get your results (4-5 days)  - Avoid travel on public transportation if possible (such as bus, train, or airplane) or o Sent to the Emergency Department by EMS for evaluation, COVID-19 testing, and possible admission depending on your condition and test results.  What to do if you are LOW RISK for COVID-19?  Reduce your risk of any infection by using the same precautions used for avoiding the common cold or flu:  . Wash your hands often with soap and warm water for at least 20 seconds.  If soap and water are not readily available, use an alcohol-based hand sanitizer with at least 60% alcohol.  . If coughing or   sneezing, cover your mouth and nose by coughing or sneezing into the elbow areas of your shirt or coat, into a tissue or into your sleeve (not your hands). . Avoid shaking hands with others and consider head nods or verbal greetings only. . Avoid touching your eyes, nose, or mouth with unwashed hands.  . Avoid close contact with people who are sick. . Avoid places or events with large numbers of people in one location, like concerts or  sporting events. . Carefully consider travel plans you have or are making. . If you are planning any travel outside or inside the [place], visit the CDC's Travelers' Health webpage for the latest health notices. . If you have some symptoms but not all symptoms, continue to monitor at home and seek medical attention if your symptoms worsen. . If you are having a medical emergency, call 911.   ADDITIONAL HEALTHCARE OPTIONS FOR PATIENTS  [facility] / e-Visit: [email or web address]         [facility]: [phone]  [facility]: [phone]                   [facility]: [phone]    

## 2018-09-17 NOTE — Progress Notes (Signed)
[facility] [phone]   [phone]  OFFICE PROGRESS NOTE  [name], MD [address] [place] [address]  DIAGNOSIS: Metastatic non-small cell carcinoma (T3, N2, M1a) non-small cell lung cancer diagnosed in November 2018 and presented with large right suprahilar and mediastinal mass as well as recent right pleural effusion.  Guardant 360: No actionable mutations.  PRIOR THERAPY:  1) Palliative radiotherapy to the large mediastinal mass under the care of Dr. Lisbeth Renshaw. 2) Systemic chemotherapy with carboplatin for AUC of 5, paclitaxel 175 mg/M2 and Keytruda 200 mg IV every 3 weeks.  First dose expected May 01, 2017.  Status post 3 cycles.  CURRENT THERAPY: Maintenance treatment with immunotherapy with single agent Keytruda 200 mg IV every 3 weeks.  First cycle 07/24/2017.  Status post 23 cycles.  INTERVAL HISTORY: Cassidy Sanders 78 y.o. female returns to the clinic today for follow-up visit.  The patient is feeling fine today with no concerning complaints.  She denied having any chest pain, shortness of breath except with exertion and she is not currently on home oxygen.  She denied having any cough or hemoptysis.  She denied having any recent weight loss or night sweats.  She has no nausea, vomiting, diarrhea or constipation.  She denied having any headache or visual changes.  She continues to tolerate her treatment with Keytruda fairly well.  The patient is here today for evaluation with repeat CT scan of the chest, abdomen and pelvis for restaging of her disease.  MEDICAL HISTORY: Past Medical History:  Diagnosis Date  . Arrhythmia   . Atrial fibrillation ([place])   . Brain tumor (benign) ([place])   . Depression   . Dyspnea    home oxygen  . Dysrhythmia    a fib  . History of home oxygen therapy 02/2017   2 L/minute  . Hypertension     ALLERGIES:  is allergic to latex; gabapentin; and tape.  MEDICATIONS:  Current Outpatient  Medications  Medication Sig Dispense Refill  . albuterol (PROVENTIL) (2.5 MG/3ML) 0.083% nebulizer solution VVN Q 4 H PRF WHEEZING OR SHORTNESS OF BREATH 360 mL 2  . Biotin 2.5 MG TABS Take 2,500 mcg by mouth daily.    . clobetasol cream (TEMOVATE) 4.49 % Apply 1 application topically 2 (two) times daily. 60 g 0  . diphenhydrAMINE (BENADRYL) 25 mg capsule Take 1 capsule (25 mg total) by mouth at bedtime as needed for sleep. 30 capsule 0  . feeding supplement, ENSURE ENLIVE, (ENSURE ENLIVE) LIQD Take 237 mLs by mouth 2 (two) times daily as needed (If patient or family requests, if pt unable to eat for a meal, or if intakes are poor). 237 mL 12  . hydrOXYzine (ATARAX/VISTARIL) 10 MG tablet Take 1 tablet (10 mg total) by mouth 3 (three) times daily as needed for itching. 30 tablet 1  . levalbuterol (XOPENEX) 0.63 MG/3ML nebulizer solution Take 3 mLs (0.63 mg total) by nebulization every 6 (six) hours as needed for wheezing or shortness of breath. 3 mL 12  . levothyroxine (SYNTHROID) 75 MCG tablet Take 1 tablet (75 mcg total) by mouth daily before breakfast. 30 tablet 2  . loperamide (IMODIUM) 2 MG capsule Take 2 mg by mouth as needed for diarrhea or loose stools.    . metoprolol tartrate (LOPRESSOR) 25 MG tablet Take 25 mg by mouth daily.     Marland Kitchen oxyCODONE (OXY IR/ROXICODONE) 5 MG immediate release tablet Take 1 tablet by mouth every 2 (  two) hours as needed.   0  . Respiratory Therapy Supplies (FLUTTER) DEVI Use as directed. 1 each 0  . sertraline (ZOLOFT) 50 MG tablet Take 50 mg by mouth daily.     No current facility-administered medications for this visit.     SURGICAL HISTORY:  Past Surgical History:  Procedure Laterality Date  . ABDOMINAL HYSTERECTOMY    . BLADDER SURGERY    . BRAIN SURGERY    . ENDOBRONCHIAL ULTRASOUND Bilateral 02/06/2017   Procedure: ENDOBRONCHIAL ULTRASOUND;  Surgeon: Juanito Doom, MD;  Location: [facility];  Service: Cardiopulmonary;  Laterality: Bilateral;  .  IR GUIDED DRAIN W CATHETER PLACEMENT  06/29/2017  . IR REMOVAL OF PLURAL CATH W/CUFF  08/28/2017  . IR US GUIDE BX ASP/DRAIN  06/29/2017  . VIDEO BRONCHOSCOPY WITH ENDOBRONCHIAL ULTRASOUND N/A 03/23/2017   Procedure: VIDEO BRONCHOSCOPY WITH ENDOBRONCHIAL ULTRASOUND;  Surgeon: Juanito Doom, MD;  Location: [facility];  Service: Thoracic;  Laterality: N/A;    REVIEW OF SYSTEMS:  Constitutional: negative Eyes: negative Ears, nose, mouth, throat, and face: negative Respiratory: positive for dyspnea on exertion Cardiovascular: negative Gastrointestinal: negative Genitourinary:negative Integument/breast: negative Hematologic/lymphatic: negative Musculoskeletal:negative Neurological: negative Behavioral/Psych: negative Endocrine: negative Allergic/Immunologic: negative   PHYSICAL EXAMINATION: General appearance: alert, cooperative and no distress Head: Normocephalic, without obvious abnormality, atraumatic Neck: no adenopathy, no JVD, supple, symmetrical, trachea midline and thyroid not enlarged, symmetric, no tenderness/mass/nodules Lymph nodes: Cervical, supraclavicular, and axillary nodes normal. Resp: clear to auscultation bilaterally Back: symmetric, no curvature. ROM normal. No CVA tenderness. Cardio: regular rate and rhythm, S1, S2 normal, no murmur, click, rub or gallop GI: soft, non-tender; bowel sounds normal; no masses,  no organomegaly Extremities: extremities normal, atraumatic, no cyanosis or edema Neurologic: Alert and oriented X 3, normal strength and tone. Normal symmetric reflexes. Normal coordination and gait  ECOG PERFORMANCE STATUS: 1 - Symptomatic but completely ambulatory  Blood pressure 138/82, pulse 87, temperature 98.3 F (36.8 C), temperature source Oral, resp. rate 18, height 5\' 7"  (1.702 m), weight 174 lb 11.2 oz (79.2 kg).  LABORATORY DATA: Lab Results  Component Value Date   WBC 6.0 09/16/2018   HGB 13.1 09/16/2018   HCT 40.9 09/16/2018   MCV 84.5  09/16/2018   PLT 216 09/16/2018      Chemistry      Component Value Date/Time   NA 142 09/16/2018 0944   NA 143 03/15/2017 1436   K 3.8 09/16/2018 0944   K 3.7 03/15/2017 1436   CL 107 09/16/2018 0944   CO2 25 09/16/2018 0944   CO2 34 (H) 03/15/2017 1436   BUN 14 09/16/2018 0944   BUN 17.8 03/15/2017 1436   CREATININE 0.75 09/16/2018 0944   CREATININE 0.8 03/15/2017 1436      Component Value Date/Time   CALCIUM 9.1 09/16/2018 0944   CALCIUM 9.6 03/15/2017 1436   ALKPHOS 127 (H) 09/16/2018 0944   ALKPHOS 88 03/15/2017 1436   AST 28 09/16/2018 0944   AST 16 03/15/2017 1436   ALT 25 09/16/2018 0944   ALT 15 03/15/2017 1436   BILITOT 0.6 09/16/2018 0944   BILITOT 0.52 03/15/2017 1436       RADIOGRAPHIC STUDIES: Ct Chest W Contrast  Result Date: 09/16/2018 CLINICAL DATA:  Right-sided lung cancer. EXAM: CT CHEST, ABDOMEN, AND PELVIS WITH CONTRAST TECHNIQUE: Multidetector CT imaging of the chest, abdomen and pelvis was performed following the standard protocol during bolus administration of intravenous contrast. CONTRAST:  167mL OMNIPAQUE IOHEXOL 300 MG/ML  SOLN COMPARISON:  06/24/2018 FINDINGS: CT CHEST FINDINGS Cardiovascular: The heart size is normal. No substantial pericardial effusion. Coronary artery calcification is evident. Atherosclerotic calcification is noted in the wall of the thoracic aorta. Mediastinum/Nodes: No mediastinal lymphadenopathy. Left low-density supraclavicular node described on the previous study not well demonstrated today. No left hilar lymphadenopathy. The esophagus has normal imaging features. There is no axillary lymphadenopathy. Lungs/Pleura: Stable volume loss right hemithorax. Treatment related changes in the parahilar right lung are stable as is the loculated fluid collection in the posterior aspect of the upper right hemithorax. Loculated fluid anterior right lower hemithorax is similar to prior. 4 mm left lower lobe pulmonary nodule (100/7) is new in  the interval. 6 mm left lower lobe nodule on 90/7 has increased from 4 mm previously. A second nodule measuring about 2-3 mm on 90/7 is new since prior. 6 mm perifissural nodule in the left lower lobe (43/7) is unchanged. 5 mm left upper lobe nodule (64/7) has progressed from 3 mm previously. Musculoskeletal: No worrisome lytic or sclerotic osseous abnormality. CT ABDOMEN PELVIS FINDINGS Hepatobiliary: No suspicious focal abnormality within the liver parenchyma. Tiny layering calcified gallstones noted. No intrahepatic or extrahepatic biliary dilation. Pancreas: No focal mass lesion. No dilatation of the main duct. No intraparenchymal cyst. No peripancreatic edema. Spleen: No splenomegaly. No focal mass lesion. Adrenals/Urinary Tract: Small bilateral adrenal nodules are stable. No suspicious abnormality the kidney. Reflux of contrast into the left renal vein is similar to prior. No evidence for hydroureter. The urinary bladder appears normal for the degree of distention. Stomach/Bowel: Stomach is unremarkable. No gastric wall thickening. No evidence of outlet obstruction. Duodenum is normally positioned as is the ligament of Treitz. No small bowel wall thickening. No small bowel dilatation. The terminal ileum is normal. The appendix is normal. No gross colonic mass. No colonic wall thickening. Vascular/Lymphatic: There is abdominal aortic atherosclerosis without aneurysm. There is no gastrohepatic or hepatoduodenal ligament lymphadenopathy. No intraperitoneal or retroperitoneal lymphadenopathy. No pelvic sidewall lymphadenopathy. Reproductive: Uterus surgically absent.  There is no adnexal mass. Other: No intraperitoneal free fluid. Musculoskeletal: No worrisome lytic or sclerotic osseous abnormality. Stable appearance T12 compression fracture. IMPRESSION: 1. Interval progression of multiple left small pulmonary nodules. Given the generalized trend of increasing size of these small nodules, close attention  recommended as metastatic disease a concern. 2. Similar appearance of post treatment changes in the right hemithorax. 3. Similar appearance of loculated right pleural fluid. 4. No findings to suggest metastatic disease in the abdomen/pelvis. 5. Cholelithiasis. 6. Stable appearance bilateral adrenal adenomas. Electronically Signed   By: Misty Stanley M.D.   On: 09/16/2018 13:08   Ct Abdomen Pelvis W Contrast  Result Date: 09/16/2018 CLINICAL DATA:  Right-sided lung cancer. EXAM: CT CHEST, ABDOMEN, AND PELVIS WITH CONTRAST TECHNIQUE: Multidetector CT imaging of the chest, abdomen and pelvis was performed following the standard protocol during bolus administration of intravenous contrast. CONTRAST:  167mL OMNIPAQUE IOHEXOL 300 MG/ML  SOLN COMPARISON:  06/24/2018 FINDINGS: CT CHEST FINDINGS Cardiovascular: The heart size is normal. No substantial pericardial effusion. Coronary artery calcification is evident. Atherosclerotic calcification is noted in the wall of the thoracic aorta. Mediastinum/Nodes: No mediastinal lymphadenopathy. Left low-density supraclavicular node described on the previous study not well demonstrated today. No left hilar lymphadenopathy. The esophagus has normal imaging features. There is no axillary lymphadenopathy. Lungs/Pleura: Stable volume loss right hemithorax. Treatment related changes in the parahilar right lung are stable as is the loculated fluid collection in the posterior aspect of the upper right hemithorax.  Loculated fluid anterior right lower hemithorax is similar to prior. 4 mm left lower lobe pulmonary nodule (100/7) is new in the interval. 6 mm left lower lobe nodule on 90/7 has increased from 4 mm previously. A second nodule measuring about 2-3 mm on 90/7 is new since prior. 6 mm perifissural nodule in the left lower lobe (43/7) is unchanged. 5 mm left upper lobe nodule (64/7) has progressed from 3 mm previously. Musculoskeletal: No worrisome lytic or sclerotic osseous  abnormality. CT ABDOMEN PELVIS FINDINGS Hepatobiliary: No suspicious focal abnormality within the liver parenchyma. Tiny layering calcified gallstones noted. No intrahepatic or extrahepatic biliary dilation. Pancreas: No focal mass lesion. No dilatation of the main duct. No intraparenchymal cyst. No peripancreatic edema. Spleen: No splenomegaly. No focal mass lesion. Adrenals/Urinary Tract: Small bilateral adrenal nodules are stable. No suspicious abnormality the kidney. Reflux of contrast into the left renal vein is similar to prior. No evidence for hydroureter. The urinary bladder appears normal for the degree of distention. Stomach/Bowel: Stomach is unremarkable. No gastric wall thickening. No evidence of outlet obstruction. Duodenum is normally positioned as is the ligament of Treitz. No small bowel wall thickening. No small bowel dilatation. The terminal ileum is normal. The appendix is normal. No gross colonic mass. No colonic wall thickening. Vascular/Lymphatic: There is abdominal aortic atherosclerosis without aneurysm. There is no gastrohepatic or hepatoduodenal ligament lymphadenopathy. No intraperitoneal or retroperitoneal lymphadenopathy. No pelvic sidewall lymphadenopathy. Reproductive: Uterus surgically absent.  There is no adnexal mass. Other: No intraperitoneal free fluid. Musculoskeletal: No worrisome lytic or sclerotic osseous abnormality. Stable appearance T12 compression fracture. IMPRESSION: 1. Interval progression of multiple left small pulmonary nodules. Given the generalized trend of increasing size of these small nodules, close attention recommended as metastatic disease a concern. 2. Similar appearance of post treatment changes in the right hemithorax. 3. Similar appearance of loculated right pleural fluid. 4. No findings to suggest metastatic disease in the abdomen/pelvis. 5. Cholelithiasis. 6. Stable appearance bilateral adrenal adenomas. Electronically Signed   By: Misty Stanley M.D.    On: 09/16/2018 13:08    ASSESSMENT AND PLAN: This is a very pleasant 78 years old white female recently diagnosed with probably stage IV non-small cell lung cancer presented with large right suprahilar lung mass in addition to mediastinal lymphadenopathy and suspicious malignant right pleural effusion.  The patient underwent systemic chemotherapy with carboplatin, paclitaxel and Keytruda status post 3 cycles.  She was tolerating this treatment well with no concerning complaints except for the last cycle when she was admitted to the hospital with C. difficile as well as dehydration and renal insufficiency.   She is currently on treatment with single agent Ketruda (pembrolizumab) status post 23 cycles.  She has been tolerating this treatment well with no concerning adverse effects. She had repeat CT scan of the chest, abdomen pelvis performed recently.  I personally and independently reviewed the scan images and discussed the results with the patient today. Her scan showed a stable disease except for mild increase in small pulmonary nodules with few subcentimeter nodules. I recommended for the patient to continue her current treatment with [facility] and she will proceed with cycle #24 today.  I will continue to monitor the pulmonary nodules closely on the upcoming imaging studies. For the hypothyroidism, she will continue on levothyroxine and we will continue to monitor her TSH closely for adjustment of her medication as needed. The patient will come back for follow-up visit in 3 weeks for evaluation before starting cycle #25. She was  advised to call immediately if she has any concerning symptoms in the interval. The patient voices understanding of current disease status and treatment options and is in agreement with the current care plan. All questions were answered. The patient knows to call the clinic with any problems, questions or concerns. We can certainly see the patient much sooner if necessary.   Disclaimer: This note was dictated with voice recognition software. Similar sounding words can inadvertently be transcribed and may not be corrected upon review.

## 2018-09-18 ENCOUNTER — Telehealth: Payer: Self-pay | Admitting: Internal Medicine

## 2018-09-18 NOTE — Telephone Encounter (Signed)
Scheduled appt per 7/07 los - pt to get an updated schedule next visit.

## 2018-10-02 ENCOUNTER — Ambulatory Visit (INDEPENDENT_AMBULATORY_CARE_PROVIDER_SITE_OTHER): Payer: Medicare Other | Admitting: Nurse Practitioner

## 2018-10-02 ENCOUNTER — Encounter: Payer: Self-pay | Admitting: Nurse Practitioner

## 2018-10-02 ENCOUNTER — Telehealth: Payer: Self-pay | Admitting: Medical Oncology

## 2018-10-02 ENCOUNTER — Other Ambulatory Visit: Payer: Self-pay

## 2018-10-02 DIAGNOSIS — C3491 Malignant neoplasm of unspecified part of right bronchus or lung: Secondary | ICD-10-CM | POA: Diagnosis not present

## 2018-10-02 DIAGNOSIS — J90 Pleural effusion, not elsewhere classified: Secondary | ICD-10-CM | POA: Diagnosis not present

## 2018-10-02 DIAGNOSIS — J9611 Chronic respiratory failure with hypoxia: Secondary | ICD-10-CM

## 2018-10-02 NOTE — Telephone Encounter (Signed)
Per Julien Nordmann I reviewed scans with daughter .

## 2018-10-02 NOTE — Patient Instructions (Addendum)
Right sided pleural effusion: At this time there is no indication for further thoracentesis, CT scan this month was stable If you have worsening shortness of breath please let us know  Chronic respiratory failure with hypoxemia: Keep using oxygen continuously when you exert yourself  Stage IV non-small cell lung cancer: Continue follow-up with Dr. Earlie Server  Follow up: Follow up with Dr. Lake Bells in 6 months or sooner if needed

## 2018-10-02 NOTE — Assessment & Plan Note (Signed)
This is been a stable interval for patient.  She did have a CT scan this month that showed stable loculated right pleural effusion.  Patient Instructions  Right sided pleural effusion: At this time there is no indication for further thoracentesis, CT scan this month was stable If you have worsening shortness of breath please let us know  Chronic respiratory failure with hypoxemia: Keep using oxygen continuously when you exert yourself  Stage IV non-small cell lung cancer: Continue follow-up with Dr. Earlie Server  Follow up: Follow up with Dr. Lake Bells in 6 months or sooner if needed

## 2018-10-02 NOTE — Progress Notes (Signed)
@Patient  ID: [name], female    DOB: 05-14-1940, 78 y.o.   MRN: [id number]  Chief Complaint  Patient presents with  . Follow-up    Referring provider: [name], MD  HPI  78 year old female former smoker with stage IV squamous cell carcinoma of right lung, chronic respiratory failure with hypoxia, history of right side pleural effusion who is followed by Dr. Lake Bells.  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 Started on Ketruda.  Tests/recent events:  Chest imaging: 09/16/18 CT chest >> Interval progression of multiple left small pulmonary nodules. Given the generalized trend of increasing size of these small nodules, close attention recommended as metastatic disease a concern. Similar appearance of post treatment changes in the right hemithorax. Similar appearance of loculated right pleural fluid. No findings to suggest metastatic disease in the abdomen/pelvis. Cholelithiasis. Stable appearance bilateral adrenal adenomas. 11/2017 CT chest>> large loculated effusion on the right which has not changed in appearance compared to the 09/2017 CT chest, mild lingular radiation fibrosis, otherwise normal pulmonary parenchyma on left December 2019 CT chest >>showing improved aeration in the right lung, less airspace disease compared to the September study, slightly decreased pleural effusion but some pleural fluid still present.   Records from her visit on 09/17/18 with Dr. Earlie Server reviewed where she was seen for her stage IV non-small cell lung cancer.  Patient underwent systemic chemotherapy with carboplatin, paclitaxel, and Keytruda status post 3 cycles.  After the last cycle she was admitted to the hospital with C. difficile as well as dehydration and renal insufficiency.  She is currently on treatment with single agent  Keytruda status post 23 cycles. It was recommended that patient continue her current treatment with [facility] and proceed  with cycle #24.   OV 10/02/18 - Follow up  Patient presents today for 64-month follow-up.  She states that she has been doing well since her last visit with Dr. Lake Bells on 04/03/2018.  She states that she has not needed to use her Xopenex or Proventil in the past several months.  She states for the past 6 weeks she has not used her oxygen.  She does keep a close eye on her oxygen level at home.  Her O2 sats were 94% on room air in the office today.  She was previously using 2 L of O2 with exertion.  Patient does have a POC at home.  She continues on Keytruda for lung cancer.  She does still get short of breath at times with exertion.  She had a CT scan this month which showed stable loculated pleural effusion.  Patient is active and is able to care for herself independently.  She states that her husband has got early stages of Alzheimer's.  She takes care of him as well.  Denies f/c/s, n/v/d, hemoptysis, PND, leg swelling.      Allergies  Allergen Reactions  . Latex Dermatitis  . Gabapentin Other (See Comments)    Excessive sleep   . Tape Rash    Immunization History  Administered Date(s) Administered  . Influenza Split 12/17/2017  . Influenza, High Dose Seasonal PF 12/26/2016  . Influenza-Unspecified 12/12/2011, 12/21/2014, 12/13/2015  . Pneumococcal Conjugate-13 12/11/2013  . Pneumococcal Polysaccharide-23 12/11/2012  . Td 03/13/1998    Past Medical History:  Diagnosis Date  . Arrhythmia   . Atrial fibrillation ([place])   . Brain tumor (benign) ([place])   . Depression   . Dyspnea    home oxygen  .  Dysrhythmia    a fib  . History of home oxygen therapy 02/2017   2 L/minute  . Hypertension     Tobacco History: Social History   Tobacco Use  Smoking Status Former Smoker  . Packs/day: 1.00  . Years: 59.00  . Pack years: 59.00  . Types: Cigarettes  . Quit date: 06/11/2012  . Years since quitting: 6.3  Smokeless Tobacco Never Used   Counseling given: Yes   Outpatient  Encounter Medications as of 10/02/2018  Medication Sig  . albuterol (PROVENTIL) (2.5 MG/3ML) 0.083% nebulizer solution VVN Q 4 H PRF WHEEZING OR SHORTNESS OF BREATH  . Biotin 2.5 MG TABS Take 2,500 mcg by mouth daily.  . clobetasol cream (TEMOVATE) 2.35 % Apply 1 application topically 2 (two) times daily.  . diphenhydrAMINE (BENADRYL) 25 mg capsule Take 1 capsule (25 mg total) by mouth at bedtime as needed for sleep.  . hydrOXYzine (ATARAX/VISTARIL) 10 MG tablet Take 1 tablet (10 mg total) by mouth 3 (three) times daily as needed for itching.  . levalbuterol (XOPENEX) 0.63 MG/3ML nebulizer solution Take 3 mLs (0.63 mg total) by nebulization every 6 (six) hours as needed for wheezing or shortness of breath.  . levothyroxine (SYNTHROID) 75 MCG tablet Take 1 tablet (75 mcg total) by mouth daily before breakfast.  . loperamide (IMODIUM) 2 MG capsule Take 2 mg by mouth as needed for diarrhea or loose stools.  . metoprolol tartrate (LOPRESSOR) 25 MG tablet Take 25 mg by mouth daily.   . mupirocin ointment (BACTROBAN) 2 % APP EXT AA TID  . oxyCODONE (OXY IR/ROXICODONE) 5 MG immediate release tablet Take 1 tablet by mouth every 2 (two) hours as needed.   Marland Kitchen Respiratory Therapy Supplies (FLUTTER) DEVI Use as directed.  . sertraline (ZOLOFT) 50 MG tablet Take 50 mg by mouth daily.   No facility-administered encounter medications on file as of 10/02/2018.      Review of Systems  Review of Systems  Constitutional: Negative.  Negative for chills and fever.  HENT: Negative.   Respiratory: Positive for cough and shortness of breath. Negative for wheezing.   Cardiovascular: Negative.  Negative for chest pain, palpitations and leg swelling.  Gastrointestinal: Negative.   Allergic/Immunologic: Negative.   Neurological: Negative.   Psychiatric/Behavioral: Negative.      Physical Exam  BP 122/72 (BP Location: Left Arm, Patient Position: Sitting, Cuff Size: Normal)   Pulse 90   Temp 98.3 F (36.8 C)    Ht 5\' 9"  (1.753 m)   Wt 174 lb (78.9 kg)   SpO2 94%   BMI 25.70 kg/m   Wt Readings from Last 5 Encounters:  10/02/18 174 lb (78.9 kg)  09/17/18 174 lb 11.2 oz (79.2 kg)  08/27/18 176 lb (79.8 kg)  08/06/18 176 lb 3.2 oz (79.9 kg)  07/16/18 172 lb 9.6 oz (78.3 kg)     Physical Exam Vitals signs and nursing note reviewed.  Constitutional:      General: She is not in acute distress.    Appearance: She is well-developed.  Cardiovascular:     Rate and Rhythm: Normal rate and regular rhythm.  Pulmonary:     Effort: Pulmonary effort is normal. No respiratory distress.     Breath sounds: Normal breath sounds. No wheezing or rhonchi.  Musculoskeletal:        General: No swelling.  Neurological:     Mental Status: She is alert and oriented to person, place, and time.       Assessment &  Plan:   Pleural effusion on right This is been a stable interval for patient.  She did have a CT scan this month that showed stable loculated right pleural effusion.  Patient Instructions  Right sided pleural effusion: At this time there is no indication for further thoracentesis, CT scan this month was stable If you have worsening shortness of breath please let us know  Chronic respiratory failure with hypoxemia: Keep using oxygen continuously when you exert yourself  Stage IV non-small cell lung cancer: Continue follow-up with Dr. Earlie Server  Follow up: Follow up with Dr. Lake Bells in 6 months or sooner if needed    Chronic respiratory failure with hypoxia [facility]) Patient is doing well.  She has not needed her oxygen in the past few weeks.  She is keeping a close eye on her O2 sats.  Patient Instructions  Right sided pleural effusion: At this time there is no indication for further thoracentesis, CT scan this month was stable If you have worsening shortness of breath please let us know  Chronic respiratory failure with hypoxemia: Keep using oxygen continuously when you exert  yourself  Stage IV non-small cell lung cancer: Continue follow-up with Dr. Earlie Server  Follow up: Follow up with Dr. Lake Bells in 6 months or sooner if needed    Stage IV squamous cell carcinoma of right lung [facility]) Patient is currently on treatment with single agent Keytruda status post 23 cycles.  She is followed by Dr. Julien Nordmann in oncology..  Patient Instructions  Right sided pleural effusion: At this time there is no indication for further thoracentesis, CT scan this month was stable If you have worsening shortness of breath please let us know  Chronic respiratory failure with hypoxemia: Keep using oxygen continuously when you exert yourself  Stage IV non-small cell lung cancer: Continue follow-up with Dr. Earlie Server  Follow up: Follow up with Dr. Lake Bells in 6 months or sooner if needed       Fenton Foy, NP 10/02/2018

## 2018-10-02 NOTE — Assessment & Plan Note (Signed)
Patient is doing well.  She has not needed her oxygen in the past few weeks.  She is keeping a close eye on her O2 sats.  Patient Instructions  Right sided pleural effusion: At this time there is no indication for further thoracentesis, CT scan this month was stable If you have worsening shortness of breath please let us know  Chronic respiratory failure with hypoxemia: Keep using oxygen continuously when you exert yourself  Stage IV non-small cell lung cancer: Continue follow-up with Dr. Earlie Server  Follow up: Follow up with Dr. Lake Bells in 6 months or sooner if needed

## 2018-10-02 NOTE — Assessment & Plan Note (Signed)
Patient is currently on treatment with single agent Keytruda status post 23 cycles.  She is followed by Dr. Julien Nordmann in oncology..  Patient Instructions  Right sided pleural effusion: At this time there is no indication for further thoracentesis, CT scan this month was stable If you have worsening shortness of breath please let us know  Chronic respiratory failure with hypoxemia: Keep using oxygen continuously when you exert yourself  Stage IV non-small cell lung cancer: Continue follow-up with Dr. Earlie Server  Follow up: Follow up with Dr. Lake Bells in 6 months or sooner if needed

## 2018-10-03 NOTE — Progress Notes (Signed)
Reviewed, agree 

## 2018-10-08 ENCOUNTER — Inpatient Hospital Stay: Payer: Medicare Other

## 2018-10-08 ENCOUNTER — Encounter: Payer: Self-pay | Admitting: Internal Medicine

## 2018-10-08 ENCOUNTER — Other Ambulatory Visit: Payer: Self-pay

## 2018-10-08 ENCOUNTER — Inpatient Hospital Stay (HOSPITAL_BASED_OUTPATIENT_CLINIC_OR_DEPARTMENT_OTHER): Payer: Medicare Other | Admitting: Internal Medicine

## 2018-10-08 VITALS — BP 129/80 | HR 85 | Temp 98.7°F | Resp 18 | Ht 69.0 in | Wt 175.4 lb

## 2018-10-08 DIAGNOSIS — C3491 Malignant neoplasm of unspecified part of right bronchus or lung: Secondary | ICD-10-CM | POA: Diagnosis not present

## 2018-10-08 DIAGNOSIS — C382 Malignant neoplasm of posterior mediastinum: Secondary | ICD-10-CM

## 2018-10-08 DIAGNOSIS — R5383 Other fatigue: Secondary | ICD-10-CM

## 2018-10-08 DIAGNOSIS — Z5112 Encounter for antineoplastic immunotherapy: Secondary | ICD-10-CM

## 2018-10-08 DIAGNOSIS — E039 Hypothyroidism, unspecified: Secondary | ICD-10-CM | POA: Diagnosis not present

## 2018-10-08 LAB — CBC WITH DIFFERENTIAL (CANCER CENTER ONLY)
Abs Immature Granulocytes: 0.01 10*3/uL (ref 0.00–0.07)
Basophils Absolute: 0.1 10*3/uL (ref 0.0–0.1)
Basophils Relative: 1 %
Eosinophils Absolute: 0.1 10*3/uL (ref 0.0–0.5)
Eosinophils Relative: 1 %
HCT: 37.7 % (ref 36.0–46.0)
Hemoglobin: 12.4 g/dL (ref 12.0–15.0)
Immature Granulocytes: 0 %
Lymphocytes Relative: 17 %
Lymphs Abs: 1.2 10*3/uL (ref 0.7–4.0)
MCH: 28 pg (ref 26.0–34.0)
MCHC: 32.9 g/dL (ref 30.0–36.0)
MCV: 85.1 fL (ref 80.0–100.0)
Monocytes Absolute: 0.8 10*3/uL (ref 0.1–1.0)
Monocytes Relative: 11 %
Neutro Abs: 4.9 10*3/uL (ref 1.7–7.7)
Neutrophils Relative %: 70 %
Platelet Count: 216 10*3/uL (ref 150–400)
RBC: 4.43 MIL/uL (ref 3.87–5.11)
RDW: 14.5 % (ref 11.5–15.5)
WBC Count: 6.9 10*3/uL (ref 4.0–10.5)
nRBC: 0 % (ref 0.0–0.2)

## 2018-10-08 LAB — CMP (CANCER CENTER ONLY)
ALT: 20 U/L (ref 0–44)
AST: 27 U/L (ref 15–41)
Albumin: 3.1 g/dL — ABNORMAL LOW (ref 3.5–5.0)
Alkaline Phosphatase: 135 U/L — ABNORMAL HIGH (ref 38–126)
Anion gap: 5 (ref 5–15)
BUN: 17 mg/dL (ref 8–23)
CO2: 27 mmol/L (ref 22–32)
Calcium: 9.3 mg/dL (ref 8.9–10.3)
Chloride: 108 mmol/L (ref 98–111)
Creatinine: 0.83 mg/dL (ref 0.44–1.00)
GFR, Est AFR Am: >60 mL/min (ref >60)
GFR, Estimated: >60 mL/min (ref >60)
Glucose, Bld: 100 mg/dL — ABNORMAL HIGH (ref 70–99)
Potassium: 4 mmol/L (ref 3.5–5.1)
Sodium: 140 mmol/L (ref 135–145)
Total Bilirubin: 0.6 mg/dL (ref 0.3–1.2)
Total Protein: 7.5 g/dL (ref 6.5–8.1)

## 2018-10-08 LAB — TSH: TSH: 3.986 u[IU]/mL — ABNORMAL HIGH (ref 0.308–3.960)

## 2018-10-08 MED ORDER — SODIUM CHLORIDE 0.9 % IV SOLN
Freq: Once | INTRAVENOUS | Status: AC
  Administered 2018-10-08: 11:00:00 via INTRAVENOUS
  Filled 2018-10-08: qty 250

## 2018-10-08 MED ORDER — SODIUM CHLORIDE 0.9 % IV SOLN
200.0000 mg | Freq: Once | INTRAVENOUS | Status: AC
  Administered 2018-10-08: 12:00:00 200 mg via INTRAVENOUS
  Filled 2018-10-08: qty 8

## 2018-10-08 NOTE — Patient Instructions (Signed)
[facility] Discharge Instructions for Patients Receiving Chemotherapy  Today you received the following chemotherapy agents Pembrolizumab (KEYTRUDA).  To help prevent nausea and vomiting after your treatment, we encourage you to take your nausea medication as prescribed.   If you develop nausea and vomiting that is not controlled by your nausea medication, call the clinic.   BELOW ARE SYMPTOMS THAT SHOULD BE REPORTED IMMEDIATELY:  *FEVER GREATER THAN 100.5 F  *CHILLS WITH OR WITHOUT FEVER  NAUSEA AND VOMITING THAT IS NOT CONTROLLED WITH YOUR NAUSEA MEDICATION  *UNUSUAL SHORTNESS OF BREATH  *UNUSUAL BRUISING OR BLEEDING  TENDERNESS IN MOUTH AND THROAT WITH OR WITHOUT PRESENCE OF ULCERS  *URINARY PROBLEMS  *BOWEL PROBLEMS  UNUSUAL RASH Items with * indicate a potential emergency and should be followed up as soon as possible.  Feel free to call the clinic should you have any questions or concerns. The clinic phone number is [phone].  Please show the CHEMO ALERT CARD at check-in to the Emergency Department and triage nurse.  Coronavirus (COVID-19) Are you at risk?  Are you at risk for the Coronavirus (COVID-19)?  To be considered HIGH RISK for Coronavirus (COVID-19), you have to meet the following criteria:  . Traveled to [place], [place], [place], [place] or [place]; or in the [place] to [place], [place], [place], or [place]; and have fever, cough, and shortness of breath within the last 2 weeks of travel OR . Been in close contact with a person diagnosed with COVID-19 within the last 2 weeks and have fever, cough, and shortness of breath . IF YOU DO NOT MEET THESE CRITERIA, YOU ARE CONSIDERED LOW RISK FOR COVID-19.  What to do if you are HIGH RISK for COVID-19?  . If you are having a medical emergency, call 911. . Seek medical care right away. Before you go to a doctor's office, urgent care or emergency department, call ahead and tell  them about your recent travel, contact with someone diagnosed with COVID-19, and your symptoms. You should receive instructions from your physician's office regarding next steps of care.  . When you arrive at healthcare provider, tell the healthcare staff immediately you have returned from visiting [place], [place], [place], [place] or [place]; or traveled in the [place] to [place], [place], [place], or [place]; in the last two weeks or you have been in close contact with a person diagnosed with COVID-19 in the last 2 weeks.   . Tell the health care staff about your symptoms: fever, cough and shortness of breath. . After you have been seen by a medical provider, you will be either: o Tested for (COVID-19) and discharged home on quarantine except to seek medical care if symptoms worsen, and asked to  - Stay home and avoid contact with others until you get your results (4-5 days)  - Avoid travel on public transportation if possible (such as bus, train, or airplane) or o Sent to the Emergency Department by EMS for evaluation, COVID-19 testing, and possible admission depending on your condition and test results.  What to do if you are LOW RISK for COVID-19?  Reduce your risk of any infection by using the same precautions used for avoiding the common cold or flu:  . Wash your hands often with soap and warm water for at least 20 seconds.  If soap and water are not readily available, use an alcohol-based hand sanitizer with at least 60% alcohol.  . If coughing or   sneezing, cover your mouth and nose by coughing or sneezing into the elbow areas of your shirt or coat, into a tissue or into your sleeve (not your hands). . Avoid shaking hands with others and consider head nods or verbal greetings only. . Avoid touching your eyes, nose, or mouth with unwashed hands.  . Avoid close contact with people who are sick. . Avoid places or events with large numbers of people in one location, like concerts or  sporting events. . Carefully consider travel plans you have or are making. . If you are planning any travel outside or inside the [place], visit the CDC's Travelers' Health webpage for the latest health notices. . If you have some symptoms but not all symptoms, continue to monitor at home and seek medical attention if your symptoms worsen. . If you are having a medical emergency, call 911.   ADDITIONAL HEALTHCARE OPTIONS FOR PATIENTS  [place] [facility] / e-Visit: [email or web address]         [facility]: [phone]  [facility]: [phone]                   [facility]: [phone]    

## 2018-10-08 NOTE — Progress Notes (Signed)
Cassidy Sanders [phone]   [phone]  OFFICE PROGRESS NOTE  [name], MD [address] [place] [address]  DIAGNOSIS: Metastatic non-small cell carcinoma (T3, N2, M1a) non-small cell lung cancer diagnosed in November 2018 and presented with large right suprahilar and mediastinal mass as well as recent right pleural effusion.  Guardant 360: No actionable mutations.  PRIOR THERAPY:  1) Palliative radiotherapy to the large mediastinal mass under the care of Dr. Lisbeth Renshaw. 2) Systemic chemotherapy with carboplatin for AUC of 5, paclitaxel 175 mg/M2 and Keytruda 200 mg IV every 3 weeks.  First dose expected May 01, 2017.  Status post 3 cycles.  CURRENT THERAPY: Maintenance treatment with immunotherapy with single agent Keytruda 200 mg IV every 3 weeks.  First cycle 07/24/2017.  Status post 24 cycles.  INTERVAL HISTORY: Cassidy Sanders 78 y.o. female returns to the clinic today for follow-up visit.  The patient is feeling fine today with no concerning complaints.  She denied having any chest pain, shortness of breath, cough or hemoptysis.  She denied having any nausea, vomiting, diarrhea or constipation.  She has no headache or visual changes.  She continues to tolerate her treatment with Keytruda fairly well.  She is here for evaluation before starting cycle #25.  MEDICAL HISTORY: Past Medical History:  Diagnosis Date   Arrhythmia    Atrial fibrillation ([place])    Brain tumor (benign) ([place])    Depression    Dyspnea    home oxygen   Dysrhythmia    a fib   History of home oxygen therapy 02/2017   2 L/minute   Hypertension     ALLERGIES:  is allergic to latex; gabapentin; and tape.  MEDICATIONS:  Current Outpatient Medications  Medication Sig Dispense Refill   albuterol (PROVENTIL) (2.5 MG/3ML) 0.083% nebulizer solution VVN Q 4 H PRF WHEEZING OR SHORTNESS OF BREATH 360 mL 2   Biotin 2.5 MG TABS Take 2,500 mcg by mouth  daily.     clobetasol cream (TEMOVATE) 2.35 % Apply 1 application topically 2 (two) times daily. 60 g 0   diphenhydrAMINE (BENADRYL) 25 mg capsule Take 1 capsule (25 mg total) by mouth at bedtime as needed for sleep. 30 capsule 0   hydrOXYzine (ATARAX/VISTARIL) 10 MG tablet Take 1 tablet (10 mg total) by mouth 3 (three) times daily as needed for itching. 30 tablet 1   levalbuterol (XOPENEX) 0.63 MG/3ML nebulizer solution Take 3 mLs (0.63 mg total) by nebulization every 6 (six) hours as needed for wheezing or shortness of breath. 3 mL 12   levothyroxine (SYNTHROID) 75 MCG tablet Take 1 tablet (75 mcg total) by mouth daily before breakfast. 30 tablet 2   loperamide (IMODIUM) 2 MG capsule Take 2 mg by mouth as needed for diarrhea or loose stools.     metoprolol tartrate (LOPRESSOR) 25 MG tablet Take 25 mg by mouth daily.      mupirocin ointment (BACTROBAN) 2 % APP EXT AA TID     oxyCODONE (OXY IR/ROXICODONE) 5 MG immediate release tablet Take 1 tablet by mouth every 2 (two) hours as needed.   0   Respiratory Therapy Supplies (FLUTTER) DEVI Use as directed. 1 each 0   sertraline (ZOLOFT) 50 MG tablet Take 50 mg by mouth daily.     No current facility-administered medications for this visit.     SURGICAL HISTORY:  Past Surgical History:  Procedure Laterality Date   ABDOMINAL HYSTERECTOMY     BLADDER SURGERY  BRAIN SURGERY     ENDOBRONCHIAL ULTRASOUND Bilateral 02/06/2017   Procedure: ENDOBRONCHIAL ULTRASOUND;  Surgeon: Juanito Doom, MD;  Location: [facility];  Service: Cardiopulmonary;  Laterality: Bilateral;   IR GUIDED DRAIN W CATHETER PLACEMENT  06/29/2017   IR REMOVAL OF PLURAL CATH W/CUFF  08/28/2017   IR US GUIDE BX ASP/DRAIN  06/29/2017   VIDEO BRONCHOSCOPY WITH ENDOBRONCHIAL ULTRASOUND N/A 03/23/2017   Procedure: VIDEO BRONCHOSCOPY WITH ENDOBRONCHIAL ULTRASOUND;  Surgeon: Juanito Doom, MD;  Location: [facility];  Service: Thoracic;  Laterality: N/A;     REVIEW OF SYSTEMS:  A comprehensive review of systems was negative except for: Constitutional: positive for fatigue Respiratory: positive for dyspnea on exertion   PHYSICAL EXAMINATION: General appearance: alert, cooperative and no distress Head: Normocephalic, without obvious abnormality, atraumatic Neck: no adenopathy, no JVD, supple, symmetrical, trachea midline and thyroid not enlarged, symmetric, no tenderness/mass/nodules Lymph nodes: Cervical, supraclavicular, and axillary nodes normal. Resp: clear to auscultation bilaterally Back: symmetric, no curvature. ROM normal. No CVA tenderness. Cardio: regular rate and rhythm, S1, S2 normal, no murmur, click, rub or gallop GI: soft, non-tender; bowel sounds normal; no masses,  no organomegaly Extremities: extremities normal, atraumatic, no cyanosis or edema  ECOG PERFORMANCE STATUS: 1 - Symptomatic but completely ambulatory  Blood pressure 129/80, pulse 85, temperature 98.7 F (37.1 C), temperature source Oral, resp. rate 18, height 5\' 9"  (1.753 m), weight 175 lb 6.4 oz (79.6 kg), SpO2 93 %.  LABORATORY DATA: Lab Results  Component Value Date   WBC 6.9 10/08/2018   HGB 12.4 10/08/2018   HCT 37.7 10/08/2018   MCV 85.1 10/08/2018   PLT 216 10/08/2018      Chemistry      Component Value Date/Time   NA 142 09/16/2018 0944   NA 143 03/15/2017 1436   K 3.8 09/16/2018 0944   K 3.7 03/15/2017 1436   CL 107 09/16/2018 0944   CO2 25 09/16/2018 0944   CO2 34 (H) 03/15/2017 1436   BUN 14 09/16/2018 0944   BUN 17.8 03/15/2017 1436   CREATININE 0.75 09/16/2018 0944   CREATININE 0.8 03/15/2017 1436      Component Value Date/Time   CALCIUM 9.1 09/16/2018 0944   CALCIUM 9.6 03/15/2017 1436   ALKPHOS 127 (H) 09/16/2018 0944   ALKPHOS 88 03/15/2017 1436   AST 28 09/16/2018 0944   AST 16 03/15/2017 1436   ALT 25 09/16/2018 0944   ALT 15 03/15/2017 1436   BILITOT 0.6 09/16/2018 0944   BILITOT 0.52 03/15/2017 1436        RADIOGRAPHIC STUDIES: Ct Chest W Contrast  Result Date: 09/16/2018 CLINICAL DATA:  Right-sided lung cancer. EXAM: CT CHEST, ABDOMEN, AND PELVIS WITH CONTRAST TECHNIQUE: Multidetector CT imaging of the chest, abdomen and pelvis was performed following the standard protocol during bolus administration of intravenous contrast. CONTRAST:  172mL OMNIPAQUE IOHEXOL 300 MG/ML  SOLN COMPARISON:  06/24/2018 FINDINGS: CT CHEST FINDINGS Cardiovascular: The heart size is normal. No substantial pericardial effusion. Coronary artery calcification is evident. Atherosclerotic calcification is noted in the wall of the thoracic aorta. Mediastinum/Nodes: No mediastinal lymphadenopathy. Left low-density supraclavicular node described on the previous study not well demonstrated today. No left hilar lymphadenopathy. The esophagus has normal imaging features. There is no axillary lymphadenopathy. Lungs/Pleura: Stable volume loss right hemithorax. Treatment related changes in the parahilar right lung are stable as is the loculated fluid collection in the posterior aspect of the upper right hemithorax. Loculated fluid anterior right lower hemithorax is similar  to prior. 4 mm left lower lobe pulmonary nodule (100/7) is new in the interval. 6 mm left lower lobe nodule on 90/7 has increased from 4 mm previously. A second nodule measuring about 2-3 mm on 90/7 is new since prior. 6 mm perifissural nodule in the left lower lobe (43/7) is unchanged. 5 mm left upper lobe nodule (64/7) has progressed from 3 mm previously. Musculoskeletal: No worrisome lytic or sclerotic osseous abnormality. CT ABDOMEN PELVIS FINDINGS Hepatobiliary: No suspicious focal abnormality within the liver parenchyma. Tiny layering calcified gallstones noted. No intrahepatic or extrahepatic biliary dilation. Pancreas: No focal mass lesion. No dilatation of the main duct. No intraparenchymal cyst. No peripancreatic edema. Spleen: No splenomegaly. No focal mass lesion.  Adrenals/Urinary Tract: Small bilateral adrenal nodules are stable. No suspicious abnormality the kidney. Reflux of contrast into the left renal vein is similar to prior. No evidence for hydroureter. The urinary bladder appears normal for the degree of distention. Stomach/Bowel: Stomach is unremarkable. No gastric wall thickening. No evidence of outlet obstruction. Duodenum is normally positioned as is the ligament of Treitz. No small bowel wall thickening. No small bowel dilatation. The terminal ileum is normal. The appendix is normal. No gross colonic mass. No colonic wall thickening. Vascular/Lymphatic: There is abdominal aortic atherosclerosis without aneurysm. There is no gastrohepatic or hepatoduodenal ligament lymphadenopathy. No intraperitoneal or retroperitoneal lymphadenopathy. No pelvic sidewall lymphadenopathy. Reproductive: Uterus surgically absent.  There is no adnexal mass. Other: No intraperitoneal free fluid. Musculoskeletal: No worrisome lytic or sclerotic osseous abnormality. Stable appearance T12 compression fracture. IMPRESSION: 1. Interval progression of multiple left small pulmonary nodules. Given the generalized trend of increasing size of these small nodules, close attention recommended as metastatic disease a concern. 2. Similar appearance of post treatment changes in the right hemithorax. 3. Similar appearance of loculated right pleural fluid. 4. No findings to suggest metastatic disease in the abdomen/pelvis. 5. Cholelithiasis. 6. Stable appearance bilateral adrenal adenomas. Electronically Signed   By: Misty Stanley M.D.   On: 09/16/2018 13:08   Ct Abdomen Pelvis W Contrast  Result Date: 09/16/2018 CLINICAL DATA:  Right-sided lung cancer. EXAM: CT CHEST, ABDOMEN, AND PELVIS WITH CONTRAST TECHNIQUE: Multidetector CT imaging of the chest, abdomen and pelvis was performed following the standard protocol during bolus administration of intravenous contrast. CONTRAST:  137mL OMNIPAQUE  IOHEXOL 300 MG/ML  SOLN COMPARISON:  06/24/2018 FINDINGS: CT CHEST FINDINGS Cardiovascular: The heart size is normal. No substantial pericardial effusion. Coronary artery calcification is evident. Atherosclerotic calcification is noted in the wall of the thoracic aorta. Mediastinum/Nodes: No mediastinal lymphadenopathy. Left low-density supraclavicular node described on the previous study not well demonstrated today. No left hilar lymphadenopathy. The esophagus has normal imaging features. There is no axillary lymphadenopathy. Lungs/Pleura: Stable volume loss right hemithorax. Treatment related changes in the parahilar right lung are stable as is the loculated fluid collection in the posterior aspect of the upper right hemithorax. Loculated fluid anterior right lower hemithorax is similar to prior. 4 mm left lower lobe pulmonary nodule (100/7) is new in the interval. 6 mm left lower lobe nodule on 90/7 has increased from 4 mm previously. A second nodule measuring about 2-3 mm on 90/7 is new since prior. 6 mm perifissural nodule in the left lower lobe (43/7) is unchanged. 5 mm left upper lobe nodule (64/7) has progressed from 3 mm previously. Musculoskeletal: No worrisome lytic or sclerotic osseous abnormality. CT ABDOMEN PELVIS FINDINGS Hepatobiliary: No suspicious focal abnormality within the liver parenchyma. Tiny layering calcified gallstones noted. No  intrahepatic or extrahepatic biliary dilation. Pancreas: No focal mass lesion. No dilatation of the main duct. No intraparenchymal cyst. No peripancreatic edema. Spleen: No splenomegaly. No focal mass lesion. Adrenals/Urinary Tract: Small bilateral adrenal nodules are stable. No suspicious abnormality the kidney. Reflux of contrast into the left renal vein is similar to prior. No evidence for hydroureter. The urinary bladder appears normal for the degree of distention. Stomach/Bowel: Stomach is unremarkable. No gastric wall thickening. No evidence of outlet  obstruction. Duodenum is normally positioned as is the ligament of Treitz. No small bowel wall thickening. No small bowel dilatation. The terminal ileum is normal. The appendix is normal. No gross colonic mass. No colonic wall thickening. Vascular/Lymphatic: There is abdominal aortic atherosclerosis without aneurysm. There is no gastrohepatic or hepatoduodenal ligament lymphadenopathy. No intraperitoneal or retroperitoneal lymphadenopathy. No pelvic sidewall lymphadenopathy. Reproductive: Uterus surgically absent.  There is no adnexal mass. Other: No intraperitoneal free fluid. Musculoskeletal: No worrisome lytic or sclerotic osseous abnormality. Stable appearance T12 compression fracture. IMPRESSION: 1. Interval progression of multiple left small pulmonary nodules. Given the generalized trend of increasing size of these small nodules, close attention recommended as metastatic disease a concern. 2. Similar appearance of post treatment changes in the right hemithorax. 3. Similar appearance of loculated right pleural fluid. 4. No findings to suggest metastatic disease in the abdomen/pelvis. 5. Cholelithiasis. 6. Stable appearance bilateral adrenal adenomas. Electronically Signed   By: Misty Stanley M.D.   On: 09/16/2018 13:08    ASSESSMENT AND PLAN: This is a very pleasant 78 years old white female recently diagnosed with probably stage IV non-small cell lung cancer presented with large right suprahilar lung mass in addition to mediastinal lymphadenopathy and suspicious malignant right pleural effusion.  The patient underwent systemic chemotherapy with carboplatin, paclitaxel and Keytruda status post 3 cycles.  She was tolerating this treatment well with no concerning complaints except for the last cycle when she was admitted to the hospital with C. difficile as well as dehydration and renal insufficiency.   She is currently on treatment with single agent Ketruda (pembrolizumab) status post 24 cycles.  The  patient has been tolerating her treatment well with no concerning adverse effects. I recommended for her to proceed with cycle #25 today as planned. For the hypothyroidism, she will continue on levothyroxine and we will continue to monitor her TSH closely for adjustment of her medication as needed. I will see her back for follow-up visit in 3 weeks for evaluation before the next cycle of her treatment. She was advised to call immediately if she has any concerning symptoms in the interval. The patient voices understanding of current disease status and treatment options and is in agreement with the current care plan. All questions were answered. The patient knows to call the clinic with any problems, questions or concerns. We can certainly see the patient much sooner if necessary.  Disclaimer: This note was dictated with voice recognition software. Similar sounding words can inadvertently be transcribed and may not be corrected upon review.

## 2018-10-09 DIAGNOSIS — D485 Neoplasm of uncertain behavior of skin: Secondary | ICD-10-CM | POA: Diagnosis not present

## 2018-10-09 DIAGNOSIS — L820 Inflamed seborrheic keratosis: Secondary | ICD-10-CM | POA: Diagnosis not present

## 2018-10-09 DIAGNOSIS — C44622 Squamous cell carcinoma of skin of right upper limb, including shoulder: Secondary | ICD-10-CM | POA: Diagnosis not present

## 2018-10-09 DIAGNOSIS — L218 Other seborrheic dermatitis: Secondary | ICD-10-CM | POA: Diagnosis not present

## 2018-10-29 ENCOUNTER — Encounter: Payer: Self-pay | Admitting: Internal Medicine

## 2018-10-29 ENCOUNTER — Other Ambulatory Visit: Payer: Self-pay

## 2018-10-29 ENCOUNTER — Inpatient Hospital Stay: Payer: Medicare Other | Attending: Internal Medicine | Admitting: Internal Medicine

## 2018-10-29 ENCOUNTER — Inpatient Hospital Stay: Payer: Medicare Other

## 2018-10-29 ENCOUNTER — Other Ambulatory Visit: Payer: Self-pay | Admitting: Medical Oncology

## 2018-10-29 VITALS — BP 110/68 | HR 81 | Temp 98.5°F | Resp 18 | Ht 69.0 in | Wt 175.5 lb

## 2018-10-29 DIAGNOSIS — E039 Hypothyroidism, unspecified: Secondary | ICD-10-CM

## 2018-10-29 DIAGNOSIS — Z79899 Other long term (current) drug therapy: Secondary | ICD-10-CM | POA: Diagnosis not present

## 2018-10-29 DIAGNOSIS — C3491 Malignant neoplasm of unspecified part of right bronchus or lung: Secondary | ICD-10-CM | POA: Insufficient documentation

## 2018-10-29 DIAGNOSIS — Z5112 Encounter for antineoplastic immunotherapy: Secondary | ICD-10-CM | POA: Diagnosis not present

## 2018-10-29 DIAGNOSIS — C382 Malignant neoplasm of posterior mediastinum: Secondary | ICD-10-CM

## 2018-10-29 DIAGNOSIS — I10 Essential (primary) hypertension: Secondary | ICD-10-CM | POA: Diagnosis not present

## 2018-10-29 LAB — CMP (CANCER CENTER ONLY)
ALT: 25 U/L (ref 0–44)
AST: 29 U/L (ref 15–41)
Albumin: 3.3 g/dL — ABNORMAL LOW (ref 3.5–5.0)
Alkaline Phosphatase: 137 U/L — ABNORMAL HIGH (ref 38–126)
Anion gap: 11 (ref 5–15)
BUN: 14 mg/dL (ref 8–23)
CO2: 24 mmol/L (ref 22–32)
Calcium: 9 mg/dL (ref 8.9–10.3)
Chloride: 105 mmol/L (ref 98–111)
Creatinine: 0.89 mg/dL (ref 0.44–1.00)
GFR, Est AFR Am: >60 mL/min (ref >60)
GFR, Estimated: >60 mL/min (ref >60)
Glucose, Bld: 82 mg/dL (ref 70–99)
Potassium: 3.9 mmol/L (ref 3.5–5.1)
Sodium: 140 mmol/L (ref 135–145)
Total Bilirubin: 0.6 mg/dL (ref 0.3–1.2)
Total Protein: 7.7 g/dL (ref 6.5–8.1)

## 2018-10-29 LAB — CBC WITH DIFFERENTIAL (CANCER CENTER ONLY)
Abs Immature Granulocytes: 0.04 10*3/uL (ref 0.00–0.07)
Basophils Absolute: 0 10*3/uL (ref 0.0–0.1)
Basophils Relative: 1 %
Eosinophils Absolute: 0.1 10*3/uL (ref 0.0–0.5)
Eosinophils Relative: 1 %
HCT: 39.3 % (ref 36.0–46.0)
Hemoglobin: 12.6 g/dL (ref 12.0–15.0)
Immature Granulocytes: 1 %
Lymphocytes Relative: 21 %
Lymphs Abs: 1.4 10*3/uL (ref 0.7–4.0)
MCH: 27.3 pg (ref 26.0–34.0)
MCHC: 32.1 g/dL (ref 30.0–36.0)
MCV: 85.2 fL (ref 80.0–100.0)
Monocytes Absolute: 0.7 10*3/uL (ref 0.1–1.0)
Monocytes Relative: 10 %
Neutro Abs: 4.6 10*3/uL (ref 1.7–7.7)
Neutrophils Relative %: 66 %
Platelet Count: 221 10*3/uL (ref 150–400)
RBC: 4.61 MIL/uL (ref 3.87–5.11)
RDW: 14.3 % (ref 11.5–15.5)
WBC Count: 6.9 10*3/uL (ref 4.0–10.5)
nRBC: 0 % (ref 0.0–0.2)

## 2018-10-29 LAB — TSH: TSH: 4.096 u[IU]/mL — ABNORMAL HIGH (ref 0.308–3.960)

## 2018-10-29 MED ORDER — SODIUM CHLORIDE 0.9 % IV SOLN
Freq: Once | INTRAVENOUS | Status: AC
  Administered 2018-10-29: 12:00:00 via INTRAVENOUS
  Filled 2018-10-29: qty 250

## 2018-10-29 MED ORDER — SODIUM CHLORIDE 0.9 % IV SOLN
200.0000 mg | Freq: Once | INTRAVENOUS | Status: AC
  Administered 2018-10-29: 13:00:00 200 mg via INTRAVENOUS
  Filled 2018-10-29: qty 8

## 2018-10-29 NOTE — Progress Notes (Signed)
[facility] Telephone:([phone]   [phone]  OFFICE PROGRESS NOTE  Cassidy Panda, MD [address] [place] [address]  DIAGNOSIS: Metastatic non-small cell carcinoma (T3, N2, M1a) non-small cell lung cancer diagnosed in November 2018 and presented with large right suprahilar and mediastinal mass as well as recent right pleural effusion.  Guardant 360: No actionable mutations.  PRIOR THERAPY:  1) Palliative radiotherapy to the large mediastinal mass under the care of Dr. Lisbeth Renshaw. 2) Systemic chemotherapy with carboplatin for AUC of 5, paclitaxel 175 mg/M2 and Keytruda 200 mg IV every 3 weeks.  First dose expected May 01, 2017.  Status post 3 cycles.  CURRENT THERAPY: Maintenance treatment with immunotherapy with single agent Keytruda 200 mg IV every 3 weeks.  First cycle 07/24/2017.  Status post 25 cycles.  INTERVAL HISTORY: Cassidy Sanders 78 y.o. female returns to the clinic today for follow-up visit.  The patient is feeling fine today with no concerning complaints.  She denied having any chest pain, shortness of breath, cough or hemoptysis.  She denied having any fever or chills.  She has no nausea, vomiting, diarrhea or constipation.  She denied having any significant weight loss or night sweats.  She is here today for evaluation before starting cycle #26.  MEDICAL HISTORY: Past Medical History:  Diagnosis Date  . Arrhythmia   . Atrial fibrillation ([facility])   . Brain tumor (benign) ([facility])   . Depression   . Dyspnea    home oxygen  . Dysrhythmia    a fib  . History of home oxygen therapy 02/2017   2 L/minute  . Hypertension     ALLERGIES:  is allergic to latex; gabapentin; and tape.  MEDICATIONS:  Current Outpatient Medications  Medication Sig Dispense Refill  . albuterol (PROVENTIL) (2.5 MG/3ML) 0.083% nebulizer solution VVN Q 4 H PRF WHEEZING OR SHORTNESS OF BREATH 360 mL 2  . Biotin 2.5 MG TABS Take 2,500 mcg by mouth daily.    .  clobetasol cream (TEMOVATE) 3.71 % Apply 1 application topically 2 (two) times daily. 60 g 0  . diphenhydrAMINE (BENADRYL) 25 mg capsule Take 1 capsule (25 mg total) by mouth at bedtime as needed for sleep. 30 capsule 0  . levalbuterol (XOPENEX) 0.63 MG/3ML nebulizer solution Take 3 mLs (0.63 mg total) by nebulization every 6 (six) hours as needed for wheezing or shortness of breath. 3 mL 12  . levothyroxine (SYNTHROID) 75 MCG tablet Take 1 tablet (75 mcg total) by mouth daily before breakfast. 30 tablet 2  . metoprolol tartrate (LOPRESSOR) 25 MG tablet Take 25 mg by mouth daily.     . mupirocin ointment (BACTROBAN) 2 % APP EXT AA TID    . Respiratory Therapy Supplies (FLUTTER) DEVI Use as directed. 1 each 0  . sertraline (ZOLOFT) 50 MG tablet Take 50 mg by mouth daily.    . hydrOXYzine (ATARAX/VISTARIL) 10 MG tablet Take 1 tablet (10 mg total) by mouth 3 (three) times daily as needed for itching. 30 tablet 1   No current facility-administered medications for this visit.     SURGICAL HISTORY:  Past Surgical History:  Procedure Laterality Date  . ABDOMINAL HYSTERECTOMY    . BLADDER SURGERY    . BRAIN SURGERY    . ENDOBRONCHIAL ULTRASOUND Bilateral 02/06/2017   Procedure: ENDOBRONCHIAL ULTRASOUND;  Surgeon: Juanito Doom, MD;  Location: [facility];  Service: Cardiopulmonary;  Laterality: Bilateral;  . IR GUIDED DRAIN W CATHETER PLACEMENT  06/29/2017  .  IR REMOVAL OF PLURAL CATH W/CUFF  08/28/2017  . IR US GUIDE BX ASP/DRAIN  06/29/2017  . VIDEO BRONCHOSCOPY WITH ENDOBRONCHIAL ULTRASOUND N/A 03/23/2017   Procedure: VIDEO BRONCHOSCOPY WITH ENDOBRONCHIAL ULTRASOUND;  Surgeon: Juanito Doom, MD;  Location: [facility];  Service: Thoracic;  Laterality: N/A;    REVIEW OF SYSTEMS:  A comprehensive review of systems was negative except for: Constitutional: positive for fatigue   PHYSICAL EXAMINATION: General appearance: alert, cooperative and no distress Head: Normocephalic, without obvious  abnormality, atraumatic Neck: no adenopathy, no JVD, supple, symmetrical, trachea midline and thyroid not enlarged, symmetric, no tenderness/mass/nodules Lymph nodes: Cervical, supraclavicular, and axillary nodes normal. Resp: clear to auscultation bilaterally Back: symmetric, no curvature. ROM normal. No CVA tenderness. Cardio: regular rate and rhythm, S1, S2 normal, no murmur, click, rub or gallop GI: soft, non-tender; bowel sounds normal; no masses,  no organomegaly Extremities: extremities normal, atraumatic, no cyanosis or edema  ECOG PERFORMANCE STATUS: 1 - Symptomatic but completely ambulatory  Blood pressure 110/68, pulse 81, temperature 98.5 F (36.9 C), temperature source Oral, resp. rate 18, height 5\' 9"  (1.753 m), weight 175 lb 8 oz (79.6 kg), SpO2 94 %.  LABORATORY DATA: Lab Results  Component Value Date   WBC 6.9 10/29/2018   HGB 12.6 10/29/2018   HCT 39.3 10/29/2018   MCV 85.2 10/29/2018   PLT 221 10/29/2018      Chemistry      Component Value Date/Time   NA 140 10/29/2018 1104   NA 143 03/15/2017 1436   K 3.9 10/29/2018 1104   K 3.7 03/15/2017 1436   CL 105 10/29/2018 1104   CO2 24 10/29/2018 1104   CO2 34 (H) 03/15/2017 1436   BUN 14 10/29/2018 1104   BUN 17.8 03/15/2017 1436   CREATININE 0.89 10/29/2018 1104   CREATININE 0.8 03/15/2017 1436      Component Value Date/Time   CALCIUM 9.0 10/29/2018 1104   CALCIUM 9.6 03/15/2017 1436   ALKPHOS 137 (H) 10/29/2018 1104   ALKPHOS 88 03/15/2017 1436   AST 29 10/29/2018 1104   AST 16 03/15/2017 1436   ALT 25 10/29/2018 1104   ALT 15 03/15/2017 1436   BILITOT 0.6 10/29/2018 1104   BILITOT 0.52 03/15/2017 1436       RADIOGRAPHIC STUDIES: No results found.  ASSESSMENT AND PLAN: This is a very pleasant 78 years old white female recently diagnosed with probably stage IV non-small cell lung cancer presented with large right suprahilar lung mass in addition to mediastinal lymphadenopathy and suspicious  malignant right pleural effusion.  The patient underwent systemic chemotherapy with carboplatin, paclitaxel and Keytruda status post 3 cycles.  She was tolerating this treatment well with no concerning complaints except for the last cycle when she was admitted to the hospital with C. difficile as well as dehydration and renal insufficiency.   She is currently on treatment with single agent Ketruda (pembrolizumab) status post 25 cycles.  The patient continues to tolerate the treatment well with no concerning adverse effects. I recommended for her to proceed with cycle #26 today as planned. For the hypothyroidism, she will continue on levothyroxine and we will continue to monitor her TSH closely for adjustment of her medication as needed. She will come back for follow-up visit in 3 weeks for evaluation before the next cycle of her treatment. The patient was advised to call immediately if she has any concerning symptoms in the interval. The patient voices understanding of current disease status and treatment options and  is in agreement with the current care plan. All questions were answered. The patient knows to call the clinic with any problems, questions or concerns. We can certainly see the patient much sooner if necessary.  Disclaimer: This note was dictated with voice recognition software. Similar sounding words can inadvertently be transcribed and may not be corrected upon review.

## 2018-10-29 NOTE — Patient Instructions (Signed)
[facility] Discharge Instructions for Patients Receiving Chemotherapy  Today you received the following chemotherapy agents:  Keytruda.  To help prevent nausea and vomiting after your treatment, we encourage you to take your nausea medication as directed.   If you develop nausea and vomiting that is not controlled by your nausea medication, call the clinic.   BELOW ARE SYMPTOMS THAT SHOULD BE REPORTED IMMEDIATELY:  *FEVER GREATER THAN 100.5 F  *CHILLS WITH OR WITHOUT FEVER  NAUSEA AND VOMITING THAT IS NOT CONTROLLED WITH YOUR NAUSEA MEDICATION  *UNUSUAL SHORTNESS OF BREATH  *UNUSUAL BRUISING OR BLEEDING  TENDERNESS IN MOUTH AND THROAT WITH OR WITHOUT PRESENCE OF ULCERS  *URINARY PROBLEMS  *BOWEL PROBLEMS  UNUSUAL RASH Items with * indicate a potential emergency and should be followed up as soon as possible.  Feel free to call the clinic should you have any questions or concerns. The clinic phone number is [phone].  Please show the CHEMO ALERT CARD at check-in to the Emergency Department and triage nurse.    

## 2018-11-06 DIAGNOSIS — C44622 Squamous cell carcinoma of skin of right upper limb, including shoulder: Secondary | ICD-10-CM | POA: Diagnosis not present

## 2018-11-15 ENCOUNTER — Other Ambulatory Visit: Payer: Self-pay | Admitting: *Deleted

## 2018-11-15 DIAGNOSIS — E039 Hypothyroidism, unspecified: Secondary | ICD-10-CM

## 2018-11-15 DIAGNOSIS — C3491 Malignant neoplasm of unspecified part of right bronchus or lung: Secondary | ICD-10-CM

## 2018-11-19 ENCOUNTER — Inpatient Hospital Stay: Payer: Medicare Other

## 2018-11-19 ENCOUNTER — Encounter: Payer: Self-pay | Admitting: Internal Medicine

## 2018-11-19 ENCOUNTER — Inpatient Hospital Stay (HOSPITAL_BASED_OUTPATIENT_CLINIC_OR_DEPARTMENT_OTHER): Payer: Medicare Other | Admitting: Internal Medicine

## 2018-11-19 ENCOUNTER — Inpatient Hospital Stay: Payer: Medicare Other | Attending: Internal Medicine

## 2018-11-19 ENCOUNTER — Other Ambulatory Visit: Payer: Self-pay

## 2018-11-19 VITALS — BP 120/66 | HR 93 | Temp 98.7°F | Resp 18 | Ht 69.0 in | Wt 173.4 lb

## 2018-11-19 DIAGNOSIS — Z79899 Other long term (current) drug therapy: Secondary | ICD-10-CM | POA: Diagnosis not present

## 2018-11-19 DIAGNOSIS — I10 Essential (primary) hypertension: Secondary | ICD-10-CM | POA: Diagnosis not present

## 2018-11-19 DIAGNOSIS — C3490 Malignant neoplasm of unspecified part of unspecified bronchus or lung: Secondary | ICD-10-CM

## 2018-11-19 DIAGNOSIS — C3491 Malignant neoplasm of unspecified part of right bronchus or lung: Secondary | ICD-10-CM

## 2018-11-19 DIAGNOSIS — C382 Malignant neoplasm of posterior mediastinum: Secondary | ICD-10-CM

## 2018-11-19 DIAGNOSIS — Z5112 Encounter for antineoplastic immunotherapy: Secondary | ICD-10-CM | POA: Insufficient documentation

## 2018-11-19 DIAGNOSIS — E039 Hypothyroidism, unspecified: Secondary | ICD-10-CM

## 2018-11-19 LAB — CMP (CANCER CENTER ONLY)
ALT: 23 U/L (ref 0–44)
AST: 30 U/L (ref 15–41)
Albumin: 3.3 g/dL — ABNORMAL LOW (ref 3.5–5.0)
Alkaline Phosphatase: 144 U/L — ABNORMAL HIGH (ref 38–126)
Anion gap: 9 (ref 5–15)
BUN: 18 mg/dL (ref 8–23)
CO2: 27 mmol/L (ref 22–32)
Calcium: 9.1 mg/dL (ref 8.9–10.3)
Chloride: 105 mmol/L (ref 98–111)
Creatinine: 0.86 mg/dL (ref 0.44–1.00)
GFR, Est AFR Am: >60 mL/min (ref >60)
GFR, Estimated: >60 mL/min (ref >60)
Glucose, Bld: 109 mg/dL — ABNORMAL HIGH (ref 70–99)
Potassium: 3.8 mmol/L (ref 3.5–5.1)
Sodium: 141 mmol/L (ref 135–145)
Total Bilirubin: 0.6 mg/dL (ref 0.3–1.2)
Total Protein: 7.8 g/dL (ref 6.5–8.1)

## 2018-11-19 LAB — CBC WITH DIFFERENTIAL (CANCER CENTER ONLY)
Abs Immature Granulocytes: 0.03 10*3/uL (ref 0.00–0.07)
Basophils Absolute: 0.1 10*3/uL (ref 0.0–0.1)
Basophils Relative: 1 %
Eosinophils Absolute: 0.1 10*3/uL (ref 0.0–0.5)
Eosinophils Relative: 1 %
HCT: 40.1 % (ref 36.0–46.0)
Hemoglobin: 12.7 g/dL (ref 12.0–15.0)
Immature Granulocytes: 0 %
Lymphocytes Relative: 17 %
Lymphs Abs: 1.2 10*3/uL (ref 0.7–4.0)
MCH: 27.5 pg (ref 26.0–34.0)
MCHC: 31.7 g/dL (ref 30.0–36.0)
MCV: 87 fL (ref 80.0–100.0)
Monocytes Absolute: 0.7 10*3/uL (ref 0.1–1.0)
Monocytes Relative: 10 %
Neutro Abs: 4.9 10*3/uL (ref 1.7–7.7)
Neutrophils Relative %: 71 %
Platelet Count: 237 10*3/uL (ref 150–400)
RBC: 4.61 MIL/uL (ref 3.87–5.11)
RDW: 14.1 % (ref 11.5–15.5)
WBC Count: 6.9 10*3/uL (ref 4.0–10.5)
nRBC: 0 % (ref 0.0–0.2)

## 2018-11-19 LAB — TSH: TSH: 3.591 u[IU]/mL (ref 0.308–3.960)

## 2018-11-19 MED ORDER — SODIUM CHLORIDE 0.9 % IV SOLN
Freq: Once | INTRAVENOUS | Status: AC
  Administered 2018-11-19: 15:00:00 via INTRAVENOUS
  Filled 2018-11-19: qty 250

## 2018-11-19 MED ORDER — SODIUM CHLORIDE 0.9 % IV SOLN
200.0000 mg | Freq: Once | INTRAVENOUS | Status: AC
  Administered 2018-11-19: 200 mg via INTRAVENOUS
  Filled 2018-11-19: qty 8

## 2018-11-19 NOTE — Progress Notes (Signed)
[facility] [phone]   [phone]  OFFICE PROGRESS NOTE  Cassidy Panda, MD [address] [place] [address]  DIAGNOSIS: Metastatic non-small cell carcinoma (T3, N2, M1a) non-small cell lung cancer diagnosed in November 2018 and presented with large right suprahilar and mediastinal mass as well as recent right pleural effusion.  Guardant 360: No actionable mutations.  PRIOR THERAPY:  1) Palliative radiotherapy to the large mediastinal mass under the care of Dr. Lisbeth Renshaw. 2) Systemic chemotherapy with carboplatin for AUC of 5, paclitaxel 175 mg/M2 and Keytruda 200 mg IV every 3 weeks.  First dose expected May 01, 2017.  Status post 3 cycles.  CURRENT THERAPY: Maintenance treatment with immunotherapy with single agent Keytruda 200 mg IV every 3 weeks.  First cycle 07/24/2017.  Status post 26 cycles.  INTERVAL HISTORY: Cassidy Sanders 78 y.o. female returns to the clinic today for follow-up visit.  The patient is feeling fine today with no concerning complaints except for mild shortness of breath with exertion.  She denied having any chest pain, cough or hemoptysis.  She denied having any fever or chills.  She has no nausea, vomiting, diarrhea or constipation.  She has no headache or visual changes.  She continues to tolerate her treatment with Keytruda fairly well.  The patient is here today for evaluation before starting cycle #27.  MEDICAL HISTORY: Past Medical History:  Diagnosis Date  . Arrhythmia   . Atrial fibrillation ([place])   . Brain tumor (benign) ([place])   . Depression   . Dyspnea    home oxygen  . Dysrhythmia    a fib  . History of home oxygen therapy 02/2017   2 L/minute  . Hypertension     ALLERGIES:  is allergic to latex; gabapentin; and tape.  MEDICATIONS:  Current Outpatient Medications  Medication Sig Dispense Refill  . albuterol (PROVENTIL) (2.5 MG/3ML) 0.083% nebulizer solution VVN Q 4 H PRF WHEEZING OR SHORTNESS  OF BREATH 360 mL 2  . Biotin 2.5 MG TABS Take 2,500 mcg by mouth daily.    . clobetasol cream (TEMOVATE) 3.14 % Apply 1 application topically 2 (two) times daily. 60 g 0  . diphenhydrAMINE (BENADRYL) 25 mg capsule Take 1 capsule (25 mg total) by mouth at bedtime as needed for sleep. 30 capsule 0  . hydrOXYzine (ATARAX/VISTARIL) 10 MG tablet Take 1 tablet (10 mg total) by mouth 3 (three) times daily as needed for itching. 30 tablet 1  . levalbuterol (XOPENEX) 0.63 MG/3ML nebulizer solution Take 3 mLs (0.63 mg total) by nebulization every 6 (six) hours as needed for wheezing or shortness of breath. 3 mL 12  . levothyroxine (SYNTHROID) 75 MCG tablet Take 1 tablet (75 mcg total) by mouth daily before breakfast. 30 tablet 2  . metoprolol tartrate (LOPRESSOR) 25 MG tablet Take 25 mg by mouth daily.     . mupirocin ointment (BACTROBAN) 2 % APP EXT AA TID    . Respiratory Therapy Supplies (FLUTTER) DEVI Use as directed. 1 each 0  . sertraline (ZOLOFT) 50 MG tablet Take 50 mg by mouth daily.     No current facility-administered medications for this visit.     SURGICAL HISTORY:  Past Surgical History:  Procedure Laterality Date  . ABDOMINAL HYSTERECTOMY    . BLADDER SURGERY    . BRAIN SURGERY    . ENDOBRONCHIAL ULTRASOUND Bilateral 02/06/2017   Procedure: ENDOBRONCHIAL ULTRASOUND;  Surgeon: Juanito Doom, MD;  Location: [facility];  Service:  Cardiopulmonary;  Laterality: Bilateral;  . IR GUIDED DRAIN W CATHETER PLACEMENT  06/29/2017  . IR REMOVAL OF PLURAL CATH W/CUFF  08/28/2017  . IR US GUIDE BX ASP/DRAIN  06/29/2017  . VIDEO BRONCHOSCOPY WITH ENDOBRONCHIAL ULTRASOUND N/A 03/23/2017   Procedure: VIDEO BRONCHOSCOPY WITH ENDOBRONCHIAL ULTRASOUND;  Surgeon: Juanito Doom, MD;  Location: [facility] OR;  Service: Thoracic;  Laterality: N/A;    REVIEW OF SYSTEMS:  A comprehensive review of systems was negative except for: Respiratory: positive for dyspnea on exertion   PHYSICAL EXAMINATION: General  appearance: alert, cooperative and no distress Head: Normocephalic, without obvious abnormality, atraumatic Neck: no adenopathy, no JVD, supple, symmetrical, trachea midline and thyroid not enlarged, symmetric, no tenderness/mass/nodules Lymph nodes: Cervical, supraclavicular, and axillary nodes normal. Resp: clear to auscultation bilaterally Back: symmetric, no curvature. ROM normal. No CVA tenderness. Cardio: regular rate and rhythm, S1, S2 normal, no murmur, click, rub or gallop GI: soft, non-tender; bowel sounds normal; no masses,  no organomegaly Extremities: extremities normal, atraumatic, no cyanosis or edema  ECOG PERFORMANCE STATUS: 1 - Symptomatic but completely ambulatory  Blood pressure 120/66, pulse 93, temperature 98.7 F (37.1 C), temperature source Oral, resp. rate 18, height 5\' 9"  (1.753 m), weight 173 lb 6.4 oz (78.7 kg), SpO2 94 %.  LABORATORY DATA: Lab Results  Component Value Date   WBC 6.9 11/19/2018   HGB 12.7 11/19/2018   HCT 40.1 11/19/2018   MCV 87.0 11/19/2018   PLT 237 11/19/2018      Chemistry      Component Value Date/Time   NA 140 10/29/2018 1104   NA 143 03/15/2017 1436   K 3.9 10/29/2018 1104   K 3.7 03/15/2017 1436   CL 105 10/29/2018 1104   CO2 24 10/29/2018 1104   CO2 34 (H) 03/15/2017 1436   BUN 14 10/29/2018 1104   BUN 17.8 03/15/2017 1436   CREATININE 0.89 10/29/2018 1104   CREATININE 0.8 03/15/2017 1436      Component Value Date/Time   CALCIUM 9.0 10/29/2018 1104   CALCIUM 9.6 03/15/2017 1436   ALKPHOS 137 (H) 10/29/2018 1104   ALKPHOS 88 03/15/2017 1436   AST 29 10/29/2018 1104   AST 16 03/15/2017 1436   ALT 25 10/29/2018 1104   ALT 15 03/15/2017 1436   BILITOT 0.6 10/29/2018 1104   BILITOT 0.52 03/15/2017 1436       RADIOGRAPHIC STUDIES: No results found.  ASSESSMENT AND PLAN: This is a very pleasant 78 years old white female recently diagnosed with probably stage IV non-small cell lung cancer presented with large  right suprahilar lung mass in addition to mediastinal lymphadenopathy and suspicious malignant right pleural effusion.  The patient underwent systemic chemotherapy with carboplatin, paclitaxel and Keytruda status post 3 cycles.  She was tolerating this treatment well with no concerning complaints except for the last cycle when she was admitted to the hospital with C. difficile as well as dehydration and renal insufficiency.   She is currently on treatment with single agent Ketruda (pembrolizumab) status post 26 cycles.  The patient has been tolerating her treatment well with no concerning adverse effects. I recommended for her to proceed with cycle #27 today as planned. I will see her back for follow-up visit in 3 weeks for evaluation with repeat CT scan of the chest, abdomen pelvis for restaging of her disease. For the hypothyroidism, she will continue on levothyroxine and we will continue to monitor her TSH closely for adjustment of her medication as needed. She was  advised to call immediately if she has any concerning symptoms in the interval. The patient voices understanding of current disease status and treatment options and is in agreement with the current care plan. All questions were answered. The patient knows to call the clinic with any problems, questions or concerns. We can certainly see the patient much sooner if necessary.  Disclaimer: This note was dictated with voice recognition software. Similar sounding words can inadvertently be transcribed and may not be corrected upon review.

## 2018-11-19 NOTE — Patient Instructions (Signed)
[facility] Discharge Instructions for Patients Receiving Chemotherapy  Today you received the following chemotherapy agents:  Keytruda.  To help prevent nausea and vomiting after your treatment, we encourage you to take your nausea medication as directed.   If you develop nausea and vomiting that is not controlled by your nausea medication, call the clinic.   BELOW ARE SYMPTOMS THAT SHOULD BE REPORTED IMMEDIATELY:  *FEVER GREATER THAN 100.5 F  *CHILLS WITH OR WITHOUT FEVER  NAUSEA AND VOMITING THAT IS NOT CONTROLLED WITH YOUR NAUSEA MEDICATION  *UNUSUAL SHORTNESS OF BREATH  *UNUSUAL BRUISING OR BLEEDING  TENDERNESS IN MOUTH AND THROAT WITH OR WITHOUT PRESENCE OF ULCERS  *URINARY PROBLEMS  *BOWEL PROBLEMS  UNUSUAL RASH Items with * indicate a potential emergency and should be followed up as soon as possible.  Feel free to call the clinic should you have any questions or concerns. The clinic phone number is [phone].  Please show the CHEMO ALERT CARD at check-in to the Emergency Department and triage nurse.    

## 2018-11-21 DIAGNOSIS — C44622 Squamous cell carcinoma of skin of right upper limb, including shoulder: Secondary | ICD-10-CM | POA: Diagnosis not present

## 2018-11-21 DIAGNOSIS — Z483 Aftercare following surgery for neoplasm: Secondary | ICD-10-CM | POA: Diagnosis not present

## 2018-11-21 DIAGNOSIS — Z4802 Encounter for removal of sutures: Secondary | ICD-10-CM | POA: Diagnosis not present

## 2018-11-22 ENCOUNTER — Other Ambulatory Visit: Payer: Self-pay | Admitting: Internal Medicine

## 2018-12-09 ENCOUNTER — Other Ambulatory Visit: Payer: Self-pay | Admitting: *Deleted

## 2018-12-09 ENCOUNTER — Other Ambulatory Visit: Payer: Self-pay

## 2018-12-09 ENCOUNTER — Ambulatory Visit (HOSPITAL_COMMUNITY)
Admission: RE | Admit: 2018-12-09 | Discharge: 2018-12-09 | Disposition: A | Discharge status: Home / Self Care | Payer: Medicare Other | Source: Ambulatory Visit | Attending: Internal Medicine | Admitting: Internal Medicine

## 2018-12-09 DIAGNOSIS — C3490 Malignant neoplasm of unspecified part of unspecified bronchus or lung: Secondary | ICD-10-CM

## 2018-12-09 MED ORDER — IOHEXOL 300 MG/ML  SOLN
100.0000 mL | Freq: Once | INTRAMUSCULAR | Status: AC | PRN
  Administered 2018-12-09: 09:00:00 100 mL via INTRAVENOUS

## 2018-12-09 MED ORDER — SODIUM CHLORIDE (PF) 0.9 % IJ SOLN
INTRAMUSCULAR | Status: AC
  Filled 2018-12-09: qty 50

## 2018-12-10 ENCOUNTER — Inpatient Hospital Stay: Payer: Medicare Other

## 2018-12-10 ENCOUNTER — Other Ambulatory Visit: Payer: Self-pay

## 2018-12-10 ENCOUNTER — Encounter: Payer: Self-pay | Admitting: Internal Medicine

## 2018-12-10 ENCOUNTER — Inpatient Hospital Stay (HOSPITAL_BASED_OUTPATIENT_CLINIC_OR_DEPARTMENT_OTHER): Payer: Medicare Other | Admitting: Internal Medicine

## 2018-12-10 VITALS — BP 120/74 | HR 92 | Temp 98.3°F | Resp 18 | Ht 69.0 in | Wt 173.4 lb

## 2018-12-10 DIAGNOSIS — C382 Malignant neoplasm of posterior mediastinum: Secondary | ICD-10-CM

## 2018-12-10 DIAGNOSIS — I10 Essential (primary) hypertension: Secondary | ICD-10-CM

## 2018-12-10 DIAGNOSIS — E039 Hypothyroidism, unspecified: Secondary | ICD-10-CM | POA: Diagnosis not present

## 2018-12-10 DIAGNOSIS — C3490 Malignant neoplasm of unspecified part of unspecified bronchus or lung: Secondary | ICD-10-CM

## 2018-12-10 DIAGNOSIS — Z5112 Encounter for antineoplastic immunotherapy: Secondary | ICD-10-CM

## 2018-12-10 DIAGNOSIS — Z79899 Other long term (current) drug therapy: Secondary | ICD-10-CM | POA: Diagnosis not present

## 2018-12-10 DIAGNOSIS — C3491 Malignant neoplasm of unspecified part of right bronchus or lung: Secondary | ICD-10-CM | POA: Diagnosis not present

## 2018-12-10 LAB — CBC WITH DIFFERENTIAL (CANCER CENTER ONLY)
Abs Immature Granulocytes: 0.03 10*3/uL (ref 0.00–0.07)
Basophils Absolute: 0 10*3/uL (ref 0.0–0.1)
Basophils Relative: 1 %
Eosinophils Absolute: 0.1 10*3/uL (ref 0.0–0.5)
Eosinophils Relative: 2 %
HCT: 40.3 % (ref 36.0–46.0)
Hemoglobin: 12.7 g/dL (ref 12.0–15.0)
Immature Granulocytes: 0 %
Lymphocytes Relative: 18 %
Lymphs Abs: 1.2 10*3/uL (ref 0.7–4.0)
MCH: 27.3 pg (ref 26.0–34.0)
MCHC: 31.5 g/dL (ref 30.0–36.0)
MCV: 86.7 fL (ref 80.0–100.0)
Monocytes Absolute: 0.7 10*3/uL (ref 0.1–1.0)
Monocytes Relative: 11 %
Neutro Abs: 4.6 10*3/uL (ref 1.7–7.7)
Neutrophils Relative %: 68 %
Platelet Count: 221 10*3/uL (ref 150–400)
RBC: 4.65 MIL/uL (ref 3.87–5.11)
RDW: 14.4 % (ref 11.5–15.5)
WBC Count: 6.7 10*3/uL (ref 4.0–10.5)
nRBC: 0 % (ref 0.0–0.2)

## 2018-12-10 LAB — CMP (CANCER CENTER ONLY)
ALT: 19 U/L (ref 0–44)
AST: 25 U/L (ref 15–41)
Albumin: 3.2 g/dL — ABNORMAL LOW (ref 3.5–5.0)
Alkaline Phosphatase: 143 U/L — ABNORMAL HIGH (ref 38–126)
Anion gap: 9 (ref 5–15)
BUN: 13 mg/dL (ref 8–23)
CO2: 26 mmol/L (ref 22–32)
Calcium: 9.2 mg/dL (ref 8.9–10.3)
Chloride: 106 mmol/L (ref 98–111)
Creatinine: 0.9 mg/dL (ref 0.44–1.00)
GFR, Est AFR Am: >60 mL/min (ref >60)
GFR, Estimated: >60 mL/min (ref >60)
Glucose, Bld: 89 mg/dL (ref 70–99)
Potassium: 3.8 mmol/L (ref 3.5–5.1)
Sodium: 141 mmol/L (ref 135–145)
Total Bilirubin: 0.6 mg/dL (ref 0.3–1.2)
Total Protein: 7.7 g/dL (ref 6.5–8.1)

## 2018-12-10 LAB — TSH: TSH: 5.06 u[IU]/mL — ABNORMAL HIGH (ref 0.308–3.960)

## 2018-12-10 MED ORDER — SODIUM CHLORIDE 0.9 % IV SOLN
200.0000 mg | Freq: Once | INTRAVENOUS | Status: AC
  Administered 2018-12-10: 14:00:00 200 mg via INTRAVENOUS
  Filled 2018-12-10: qty 8

## 2018-12-10 MED ORDER — SODIUM CHLORIDE 0.9 % IV SOLN
Freq: Once | INTRAVENOUS | Status: AC
  Administered 2018-12-10: 13:00:00 via INTRAVENOUS
  Filled 2018-12-10: qty 250

## 2018-12-10 NOTE — Patient Instructions (Signed)
[facility] Discharge Instructions for Patients Receiving Immunotherapy  Today you received the following immunootherapy agents :  Keytruda/Pembrolizumab  To help prevent nausea and vomiting after your treatment, we encourage you to take your nausea medication if needed If you develop nausea and vomiting that is not controlled by your nausea medication, call the clinic.   BELOW ARE SYMPTOMS THAT SHOULD BE REPORTED IMMEDIATELY:  *FEVER GREATER THAN 100.5 F  *CHILLS WITH OR WITHOUT FEVER  NAUSEA AND VOMITING THAT IS NOT CONTROLLED WITH YOUR NAUSEA MEDICATION  *UNUSUAL SHORTNESS OF BREATH  *UNUSUAL BRUISING OR BLEEDING  TENDERNESS IN MOUTH AND THROAT WITH OR WITHOUT PRESENCE OF ULCERS  *URINARY PROBLEMS  *BOWEL PROBLEMS  UNUSUAL RASH Items with * indicate a potential emergency and should be followed up as soon as possible.  Feel free to call the clinic should you have any questions or concerns. The clinic phone number is [phone].  Please show the [facility] at check-in to the Emergency Department and triage nurse.

## 2018-12-10 NOTE — Progress Notes (Signed)
[place] [phone]   [phone]  OFFICE PROGRESS NOTE  [name], MD [address] [place] [address]  DIAGNOSIS: Metastatic non-small cell carcinoma (T3, N2, M1a) non-small cell lung cancer diagnosed in November 2018 and presented with large right suprahilar and mediastinal mass as well as recent right pleural effusion.  Guardant 360: No actionable mutations.  PRIOR THERAPY:  1) Palliative radiotherapy to the large mediastinal mass under the care of Dr. Lisbeth Renshaw. 2) Systemic chemotherapy with carboplatin for AUC of 5, paclitaxel 175 mg/M2 and Keytruda 200 mg IV every 3 weeks.  First dose expected May 01, 2017.  Status post 3 cycles.  CURRENT THERAPY: Maintenance treatment with immunotherapy with single agent Keytruda 200 mg IV every 3 weeks.  First cycle 07/24/2017.  Status post 27 cycles.  INTERVAL HISTORY: Cassidy Sanders 78 y.o. female returns to the clinic today for follow-up visit.  The patient is feeling fine today with no concerning complaints.  She denied having any chest pain, shortness of breath except with exertion with no cough or hemoptysis.  She denied having any fever or chills.  She has no nausea, vomiting, diarrhea or constipation.  She denied having any headache or visual changes.  She had repeat CT scan of the chest, abdomen pelvis performed recently and she is here for evaluation and discussion of her scan results.  MEDICAL HISTORY: Past Medical History:  Diagnosis Date   Arrhythmia    Atrial fibrillation ([place] [facility])    Brain tumor (benign) ([place])    Depression    Dyspnea    home oxygen   Dysrhythmia    a fib   History of home oxygen therapy 02/2017   2 L/minute   Hypertension     ALLERGIES:  is allergic to latex; gabapentin; and tape.  MEDICATIONS:  Current Outpatient Medications  Medication Sig Dispense Refill   albuterol (PROVENTIL) (2.5 MG/3ML) 0.083% nebulizer solution VVN Q 4 H PRF WHEEZING  OR SHORTNESS OF BREATH 360 mL 2   Biotin 2.5 MG TABS Take 2,500 mcg by mouth daily.     clobetasol cream (TEMOVATE) 1.47 % Apply 1 application topically 2 (two) times daily. 60 g 0   diphenhydrAMINE (BENADRYL) 25 mg capsule Take 1 capsule (25 mg total) by mouth at bedtime as needed for sleep. 30 capsule 0   hydrOXYzine (ATARAX/VISTARIL) 10 MG tablet Take 1 tablet (10 mg total) by mouth 3 (three) times daily as needed for itching. 30 tablet 1   levalbuterol (XOPENEX) 0.63 MG/3ML nebulizer solution Take 3 mLs (0.63 mg total) by nebulization every 6 (six) hours as needed for wheezing or shortness of breath. 3 mL 12   levothyroxine (SYNTHROID) 75 MCG tablet TAKE 1 TABLET(75 MCG) BY MOUTH DAILY BEFORE BREAKFAST 30 tablet 2   metoprolol tartrate (LOPRESSOR) 25 MG tablet Take 25 mg by mouth daily.      mupirocin ointment (BACTROBAN) 2 % APP EXT AA TID     Respiratory Therapy Supplies (FLUTTER) DEVI Use as directed. 1 each 0   sertraline (ZOLOFT) 50 MG tablet Take 50 mg by mouth daily.     No current facility-administered medications for this visit.     SURGICAL HISTORY:  Past Surgical History:  Procedure Laterality Date   ABDOMINAL HYSTERECTOMY     BLADDER SURGERY     BRAIN SURGERY     ENDOBRONCHIAL ULTRASOUND Bilateral 02/06/2017   Procedure: ENDOBRONCHIAL ULTRASOUND;  Surgeon: Juanito Doom, MD;  Location: [facility];  Service: Cardiopulmonary;  Laterality: Bilateral;   IR GUIDED DRAIN W CATHETER PLACEMENT  06/29/2017   IR REMOVAL OF PLURAL CATH W/CUFF  08/28/2017   IR US GUIDE BX ASP/DRAIN  06/29/2017   VIDEO BRONCHOSCOPY WITH ENDOBRONCHIAL ULTRASOUND N/A 03/23/2017   Procedure: VIDEO BRONCHOSCOPY WITH ENDOBRONCHIAL ULTRASOUND;  Surgeon: Juanito Doom, MD;  Location: [facility];  Service: Thoracic;  Laterality: N/A;    REVIEW OF SYSTEMS:  Constitutional: negative Eyes: negative Ears, nose, mouth, throat, and face: negative Respiratory: positive for dyspnea on  exertion Cardiovascular: negative Gastrointestinal: negative Genitourinary:negative Integument/breast: negative Hematologic/lymphatic: negative Musculoskeletal:negative Neurological: negative Behavioral/Psych: negative Endocrine: negative Allergic/Immunologic: negative   PHYSICAL EXAMINATION: General appearance: alert, cooperative and no distress Head: Normocephalic, without obvious abnormality, atraumatic Neck: no adenopathy, no JVD, supple, symmetrical, trachea midline and thyroid not enlarged, symmetric, no tenderness/mass/nodules Lymph nodes: Cervical, supraclavicular, and axillary nodes normal. Resp: clear to auscultation bilaterally Back: symmetric, no curvature. ROM normal. No CVA tenderness. Cardio: regular rate and rhythm, S1, S2 normal, no murmur, click, rub or gallop GI: soft, non-tender; bowel sounds normal; no masses,  no organomegaly Extremities: extremities normal, atraumatic, no cyanosis or edema Neurologic: Alert and oriented X 3, normal strength and tone. Normal symmetric reflexes. Normal coordination and gait  ECOG PERFORMANCE STATUS: 1 - Symptomatic but completely ambulatory  Blood pressure 120/74, pulse 92, temperature 98.3 F (36.8 C), temperature source Oral, resp. rate 18, height 5\' 9"  (1.753 m), weight 173 lb 6.4 oz (78.7 kg), SpO2 93 %.  LABORATORY DATA: Lab Results  Component Value Date   WBC 6.7 12/10/2018   HGB 12.7 12/10/2018   HCT 40.3 12/10/2018   MCV 86.7 12/10/2018   PLT 221 12/10/2018      Chemistry      Component Value Date/Time   NA 141 11/19/2018 1316   NA 143 03/15/2017 1436   K 3.8 11/19/2018 1316   K 3.7 03/15/2017 1436   CL 105 11/19/2018 1316   CO2 27 11/19/2018 1316   CO2 34 (H) 03/15/2017 1436   BUN 18 11/19/2018 1316   BUN 17.8 03/15/2017 1436   CREATININE 0.86 11/19/2018 1316   CREATININE 0.8 03/15/2017 1436      Component Value Date/Time   CALCIUM 9.1 11/19/2018 1316   CALCIUM 9.6 03/15/2017 1436   ALKPHOS 144 (H)  11/19/2018 1316   ALKPHOS 88 03/15/2017 1436   AST 30 11/19/2018 1316   AST 16 03/15/2017 1436   ALT 23 11/19/2018 1316   ALT 15 03/15/2017 1436   BILITOT 0.6 11/19/2018 1316   BILITOT 0.52 03/15/2017 1436       RADIOGRAPHIC STUDIES: Ct Chest W Contrast  Result Date: 12/09/2018 CLINICAL DATA:  Lung cancer. EXAM: CT CHEST, ABDOMEN, AND PELVIS WITH CONTRAST TECHNIQUE: Multidetector CT imaging of the chest, abdomen and pelvis was performed following the standard protocol during bolus administration of intravenous contrast. CONTRAST:  170mL OMNIPAQUE IOHEXOL 300 MG/ML  SOLN COMPARISON:  09/16/2018. FINDINGS: CT CHEST FINDINGS Cardiovascular: Atherosclerotic calcification of the aorta, aortic valve and coronary arteries. Pulmonic trunk is enlarged. Heart is at the upper limits of normal in size. No pericardial effusion. Extensive collateral venous flow is noted. Mediastinum/Nodes: Low-attenuation right thyroid nodule measures 13 mm, similar. Low left internal jugular lymph node measures 11 mm, stable. There is ill-defined soft tissue in the low right paratracheal and subcarinal stations, as before. No left hilar or axillary adenopathy. Esophagus is grossly unremarkable. Lungs/Pleura: Extensive parenchymal retraction and bronchiectasis in the upper/mid aspect of the medial  right hemithorax, similar to the prior exam. There is a partially loculated moderate right pleural effusion, decreased in size from the prior. Extensive pleural thickening is seen in association with complex pleural fluid in the posterior mid right hemithorax. Multiple new hematogenously distributed pulmonary nodules bilaterally with an index lesion measuring 8 mm in the left lower lobe (4/85). No left pleural fluid. There is narrowing of the bronchus intermedius, similar to the prior exam. Airway is otherwise unremarkable. Musculoskeletal: T12 compression fracture is unchanged. No worrisome lytic or sclerotic lesions. CT ABDOMEN PELVIS  FINDINGS Hepatobiliary: Liver measures 19.9 cm liver is otherwise unremarkable. Stones layer in the gallbladder. No biliary ductal dilatation. Pancreas: Negative. Spleen: Negative. Adrenals/Urinary Tract: Fluid density adrenal nodules measure 1.6 cm on the right and 2.3 cm on the left, as before. Low-attenuation lesions in the kidneys are too small to characterize but statistically, cysts are likely. Ureters are decompressed. Bladder is grossly unremarkable. Stomach/Bowel: Stomach, small bowel and colon are unremarkable. Appendix is not readily visualized. Vascular/Lymphatic: Atherosclerotic calcification of the aorta. Infrarenal aorta measures up to 2.7 cm. No pathologically enlarged lymph nodes. Reproductive: Hysterectomy.  No adnexal mass. Other: No free fluid. Small right inguinal hernia contains fat. Mesenteries and peritoneum are unremarkable. Musculoskeletal: Degenerative changes in the spine. No worrisome lytic or sclerotic lesions. IMPRESSION: 1. Worsening pulmonary metastatic disease. 2. Post treatment parenchymal retraction and bronchiectasis in the right hemithorax with narrowing of the bronchus intermedius, stable. Associated moderate loculated right pleural effusion, decreased in size. 3. Hepatomegaly. 4. Cholelithiasis. 5. Bilateral adrenal adenomas. 6. Aortic atherosclerosis (ICD10-170.0). Coronary artery calcification. 7. Ectatic abdominal aorta at risk for aneurysm development. Recommend followup by ultrasound in 5 years. This recommendation follows ACR consensus guidelines: White Paper of the ACR Incidental Findings Committee II on Vascular Findings. J Am Coll Radiol 2013; 10:789-794. 8. Enlarged pulmonic trunk, indicative of pulmonary arterial hypertension. Electronically Signed   By: Lorin Picket M.D.   On: 12/09/2018 13:36   Ct Abdomen Pelvis W Contrast  Result Date: 12/09/2018 CLINICAL DATA:  Lung cancer. EXAM: CT CHEST, ABDOMEN, AND PELVIS WITH CONTRAST TECHNIQUE: Multidetector CT  imaging of the chest, abdomen and pelvis was performed following the standard protocol during bolus administration of intravenous contrast. CONTRAST:  161mL OMNIPAQUE IOHEXOL 300 MG/ML  SOLN COMPARISON:  09/16/2018. FINDINGS: CT CHEST FINDINGS Cardiovascular: Atherosclerotic calcification of the aorta, aortic valve and coronary arteries. Pulmonic trunk is enlarged. Heart is at the upper limits of normal in size. No pericardial effusion. Extensive collateral venous flow is noted. Mediastinum/Nodes: Low-attenuation right thyroid nodule measures 13 mm, similar. Low left internal jugular lymph node measures 11 mm, stable. There is ill-defined soft tissue in the low right paratracheal and subcarinal stations, as before. No left hilar or axillary adenopathy. Esophagus is grossly unremarkable. Lungs/Pleura: Extensive parenchymal retraction and bronchiectasis in the upper/mid aspect of the medial right hemithorax, similar to the prior exam. There is a partially loculated moderate right pleural effusion, decreased in size from the prior. Extensive pleural thickening is seen in association with complex pleural fluid in the posterior mid right hemithorax. Multiple new hematogenously distributed pulmonary nodules bilaterally with an index lesion measuring 8 mm in the left lower lobe (4/85). No left pleural fluid. There is narrowing of the bronchus intermedius, similar to the prior exam. Airway is otherwise unremarkable. Musculoskeletal: T12 compression fracture is unchanged. No worrisome lytic or sclerotic lesions. CT ABDOMEN PELVIS FINDINGS Hepatobiliary: Liver measures 19.9 cm liver is otherwise unremarkable. Stones layer in the gallbladder. No biliary  ductal dilatation. Pancreas: Negative. Spleen: Negative. Adrenals/Urinary Tract: Fluid density adrenal nodules measure 1.6 cm on the right and 2.3 cm on the left, as before. Low-attenuation lesions in the kidneys are too small to characterize but statistically, cysts are  likely. Ureters are decompressed. Bladder is grossly unremarkable. Stomach/Bowel: Stomach, small bowel and colon are unremarkable. Appendix is not readily visualized. Vascular/Lymphatic: Atherosclerotic calcification of the aorta. Infrarenal aorta measures up to 2.7 cm. No pathologically enlarged lymph nodes. Reproductive: Hysterectomy.  No adnexal mass. Other: No free fluid. Small right inguinal hernia contains fat. Mesenteries and peritoneum are unremarkable. Musculoskeletal: Degenerative changes in the spine. No worrisome lytic or sclerotic lesions. IMPRESSION: 1. Worsening pulmonary metastatic disease. 2. Post treatment parenchymal retraction and bronchiectasis in the right hemithorax with narrowing of the bronchus intermedius, stable. Associated moderate loculated right pleural effusion, decreased in size. 3. Hepatomegaly. 4. Cholelithiasis. 5. Bilateral adrenal adenomas. 6. Aortic atherosclerosis (ICD10-170.0). Coronary artery calcification. 7. Ectatic abdominal aorta at risk for aneurysm development. Recommend followup by ultrasound in 5 years. This recommendation follows ACR consensus guidelines: White Paper of the ACR Incidental Findings Committee II on Vascular Findings. J Am Coll Radiol 2013; 10:789-794. 8. Enlarged pulmonic trunk, indicative of pulmonary arterial hypertension. Electronically Signed   By: Lorin Picket M.D.   On: 12/09/2018 13:36    ASSESSMENT AND PLAN: This is a very pleasant 79 years old white female recently diagnosed with probably stage IV non-small cell lung cancer presented with large right suprahilar lung mass in addition to mediastinal lymphadenopathy and suspicious malignant right pleural effusion.  The patient underwent systemic chemotherapy with carboplatin, paclitaxel and Keytruda status post 3 cycles.  She was tolerating this treatment well with no concerning complaints except for the last cycle when she was admitted to the hospital with C. difficile as well as  dehydration and renal insufficiency.   She is currently on treatment with single agent Ketruda (pembrolizumab) status post 27 cycles.  The patient continues to tolerate her treatment fairly well with no concerning adverse effects. She had repeat CT scan of the chest, abdomen pelvis performed recently.  I personally and independently reviewed the scan images and also discussed it with radiology.  She has mild increase in some of the pulmonary nodules in the left lung but the biggest one is no larger than 8 mm in size. I also showed the images to the patient today. I recommended for her to continue her current treatment with immunotherapy with close monitoring of the pulmonary nodules on the upcoming imaging studies.  The patient indicated in the past that she would not be interested in proceeding with systemic chemotherapy.  I think the best option for her is to continue with the immunotherapy with the same regimen and close monitoring of the nodule and consideration of stereotactic radiotherapy to the most progressive one in the future. For the hypothyroidism, she will continue on levothyroxine and we will continue to monitor her TSH closely for adjustment of her medication as needed. The patient will proceed with cycle #28 today. She will come back for follow-up visit in 3 weeks for evaluation before the next cycle of her treatment. She was advised to call immediately if she has any concerning symptoms in the interval. The patient voices understanding of current disease status and treatment options and is in agreement with the current care plan. All questions were answered. The patient knows to call the clinic with any problems, questions or concerns. We can certainly see the patient much sooner  if necessary.  Disclaimer: This note was dictated with voice recognition software. Similar sounding words can inadvertently be transcribed and may not be corrected upon review.

## 2018-12-18 DIAGNOSIS — C3491 Malignant neoplasm of unspecified part of right bronchus or lung: Secondary | ICD-10-CM | POA: Diagnosis not present

## 2018-12-18 DIAGNOSIS — E039 Hypothyroidism, unspecified: Secondary | ICD-10-CM | POA: Diagnosis not present

## 2018-12-18 DIAGNOSIS — I119 Hypertensive heart disease without heart failure: Secondary | ICD-10-CM | POA: Diagnosis not present

## 2018-12-23 ENCOUNTER — Other Ambulatory Visit: Payer: Self-pay | Admitting: Internal Medicine

## 2018-12-31 ENCOUNTER — Inpatient Hospital Stay: Payer: Medicare Other | Attending: Internal Medicine

## 2018-12-31 ENCOUNTER — Inpatient Hospital Stay (HOSPITAL_BASED_OUTPATIENT_CLINIC_OR_DEPARTMENT_OTHER): Payer: Medicare Other | Admitting: Internal Medicine

## 2018-12-31 ENCOUNTER — Other Ambulatory Visit: Payer: Self-pay

## 2018-12-31 ENCOUNTER — Encounter: Payer: Self-pay | Admitting: Internal Medicine

## 2018-12-31 ENCOUNTER — Inpatient Hospital Stay: Payer: Medicare Other

## 2018-12-31 VITALS — BP 133/81 | HR 86 | Temp 98.5°F | Resp 18 | Ht 69.0 in | Wt 173.5 lb

## 2018-12-31 DIAGNOSIS — Z5112 Encounter for antineoplastic immunotherapy: Secondary | ICD-10-CM | POA: Insufficient documentation

## 2018-12-31 DIAGNOSIS — C382 Malignant neoplasm of posterior mediastinum: Secondary | ICD-10-CM | POA: Diagnosis not present

## 2018-12-31 DIAGNOSIS — Z79899 Other long term (current) drug therapy: Secondary | ICD-10-CM | POA: Insufficient documentation

## 2018-12-31 DIAGNOSIS — E039 Hypothyroidism, unspecified: Secondary | ICD-10-CM

## 2018-12-31 DIAGNOSIS — C3491 Malignant neoplasm of unspecified part of right bronchus or lung: Secondary | ICD-10-CM | POA: Diagnosis not present

## 2018-12-31 DIAGNOSIS — I10 Essential (primary) hypertension: Secondary | ICD-10-CM | POA: Diagnosis not present

## 2018-12-31 DIAGNOSIS — C3490 Malignant neoplasm of unspecified part of unspecified bronchus or lung: Secondary | ICD-10-CM

## 2018-12-31 LAB — CBC WITH DIFFERENTIAL (CANCER CENTER ONLY)
Abs Immature Granulocytes: 0.02 10*3/uL (ref 0.00–0.07)
Basophils Absolute: 0.1 10*3/uL (ref 0.0–0.1)
Basophils Relative: 1 %
Eosinophils Absolute: 0.1 10*3/uL (ref 0.0–0.5)
Eosinophils Relative: 1 %
HCT: 41.1 % (ref 36.0–46.0)
Hemoglobin: 12.9 g/dL (ref 12.0–15.0)
Immature Granulocytes: 0 %
Lymphocytes Relative: 19 %
Lymphs Abs: 1.5 10*3/uL (ref 0.7–4.0)
MCH: 27.3 pg (ref 26.0–34.0)
MCHC: 31.4 g/dL (ref 30.0–36.0)
MCV: 87.1 fL (ref 80.0–100.0)
Monocytes Absolute: 0.8 10*3/uL (ref 0.1–1.0)
Monocytes Relative: 10 %
Neutro Abs: 5.3 10*3/uL (ref 1.7–7.7)
Neutrophils Relative %: 69 %
Platelet Count: 240 10*3/uL (ref 150–400)
RBC: 4.72 MIL/uL (ref 3.87–5.11)
RDW: 14.6 % (ref 11.5–15.5)
WBC Count: 7.7 10*3/uL (ref 4.0–10.5)
nRBC: 0 % (ref 0.0–0.2)

## 2018-12-31 LAB — CMP (CANCER CENTER ONLY)
ALT: 19 U/L (ref 0–44)
AST: 24 U/L (ref 15–41)
Albumin: 3.3 g/dL — ABNORMAL LOW (ref 3.5–5.0)
Alkaline Phosphatase: 139 U/L — ABNORMAL HIGH (ref 38–126)
Anion gap: 11 (ref 5–15)
BUN: 23 mg/dL (ref 8–23)
CO2: 24 mmol/L (ref 22–32)
Calcium: 9.6 mg/dL (ref 8.9–10.3)
Chloride: 106 mmol/L (ref 98–111)
Creatinine: 0.84 mg/dL (ref 0.44–1.00)
GFR, Est AFR Am: >60 mL/min (ref >60)
GFR, Estimated: >60 mL/min (ref >60)
Glucose, Bld: 78 mg/dL (ref 70–99)
Potassium: 3.7 mmol/L (ref 3.5–5.1)
Sodium: 141 mmol/L (ref 135–145)
Total Bilirubin: 0.7 mg/dL (ref 0.3–1.2)
Total Protein: 8.1 g/dL (ref 6.5–8.1)

## 2018-12-31 LAB — TSH: TSH: 4.482 u[IU]/mL — ABNORMAL HIGH (ref 0.308–3.960)

## 2018-12-31 MED ORDER — SODIUM CHLORIDE 0.9 % IV SOLN
Freq: Once | INTRAVENOUS | Status: AC
  Administered 2018-12-31: 12:00:00 via INTRAVENOUS
  Filled 2018-12-31: qty 250

## 2018-12-31 MED ORDER — SODIUM CHLORIDE 0.9 % IV SOLN
200.0000 mg | Freq: Once | INTRAVENOUS | Status: AC
  Administered 2018-12-31: 200 mg via INTRAVENOUS
  Filled 2018-12-31: qty 8

## 2018-12-31 NOTE — Progress Notes (Signed)
[facility] [phone]   [phone]  OFFICE PROGRESS NOTE  [name], MD [address] [place] [address]  DIAGNOSIS: Metastatic non-small cell carcinoma (T3, N2, M1a) non-small cell lung cancer diagnosed in November 2018 and presented with large right suprahilar and mediastinal mass as well as recent right pleural effusion.  Guardant 360: No actionable mutations.  PRIOR THERAPY:  1) Palliative radiotherapy to the large mediastinal mass under the care of Dr. Lisbeth Renshaw. 2) Systemic chemotherapy with carboplatin for AUC of 5, paclitaxel 175 mg/M2 and Keytruda 200 mg IV every 3 weeks.  First dose expected May 01, 2017.  Status post 3 cycles.  CURRENT THERAPY: Maintenance treatment with immunotherapy with single agent Keytruda 200 mg IV every 3 weeks.  First cycle 07/24/2017.  Status post 28 cycles.  INTERVAL HISTORY: Faelynn Wynder 78 y.o. female returns to the clinic today for follow-up visit.  The patient is feeling fine today with no concerning complaints.  She denied having any chest pain, shortness of breath, cough or hemoptysis.  She denied having any fever or chills.  She has no nausea, vomiting, diarrhea or constipation.  She has no headache or visual changes.  She has no recent weight loss or night sweats.  She continues to tolerate her treatment with maintenance Keytruda fairly well.  She is here today for evaluation before starting cycle #29.  MEDICAL HISTORY: Past Medical History:  Diagnosis Date  . Arrhythmia   . Atrial fibrillation ([place])   . Brain tumor (benign) ([place])   . Depression   . Dyspnea    home oxygen  . Dysrhythmia    a fib  . History of home oxygen therapy 02/2017   2 L/minute  . Hypertension     ALLERGIES:  is allergic to latex; gabapentin; and tape.  MEDICATIONS:  Current Outpatient Medications  Medication Sig Dispense Refill  . albuterol (PROVENTIL) (2.5 MG/3ML) 0.083% nebulizer solution VVN Q 4 H  PRF WHEEZING OR SHORTNESS OF BREATH 360 mL 2  . Biotin 2.5 MG TABS Take 2,500 mcg by mouth daily.    . clobetasol cream (TEMOVATE) 7.34 % Apply 1 application topically 2 (two) times daily. 60 g 0  . diphenhydrAMINE (BENADRYL) 25 mg capsule Take 1 capsule (25 mg total) by mouth at bedtime as needed for sleep. 30 capsule 0  . hydrOXYzine (ATARAX/VISTARIL) 10 MG tablet Take 1 tablet (10 mg total) by mouth 3 (three) times daily as needed for itching. 30 tablet 1  . levalbuterol (XOPENEX) 0.63 MG/3ML nebulizer solution Take 3 mLs (0.63 mg total) by nebulization every 6 (six) hours as needed for wheezing or shortness of breath. 3 mL 12  . levothyroxine (SYNTHROID) 75 MCG tablet TAKE 1 TABLET(75 MCG) BY MOUTH DAILY BEFORE BREAKFAST 30 tablet 2  . metoprolol tartrate (LOPRESSOR) 25 MG tablet Take 25 mg by mouth daily.     . mupirocin ointment (BACTROBAN) 2 % APP EXT AA TID    . Respiratory Therapy Supplies (FLUTTER) DEVI Use as directed. 1 each 0  . sertraline (ZOLOFT) 50 MG tablet Take 50 mg by mouth daily.     No current facility-administered medications for this visit.     SURGICAL HISTORY:  Past Surgical History:  Procedure Laterality Date  . ABDOMINAL HYSTERECTOMY    . BLADDER SURGERY    . BRAIN SURGERY    . ENDOBRONCHIAL ULTRASOUND Bilateral 02/06/2017   Procedure: ENDOBRONCHIAL ULTRASOUND;  Surgeon: Juanito Doom, MD;  Location: [facility]  ENDOSCOPY;  Service: Cardiopulmonary;  Laterality: Bilateral;  . IR GUIDED DRAIN W CATHETER PLACEMENT  06/29/2017  . IR REMOVAL OF PLURAL CATH W/CUFF  08/28/2017  . IR US GUIDE BX ASP/DRAIN  06/29/2017  . VIDEO BRONCHOSCOPY WITH ENDOBRONCHIAL ULTRASOUND N/A 03/23/2017   Procedure: VIDEO BRONCHOSCOPY WITH ENDOBRONCHIAL ULTRASOUND;  Surgeon: Juanito Doom, MD;  Location: [facility] OR;  Service: Thoracic;  Laterality: N/A;    REVIEW OF SYSTEMS:  A comprehensive review of systems was negative.   PHYSICAL EXAMINATION: General appearance: alert, cooperative and no  distress Head: Normocephalic, without obvious abnormality, atraumatic Neck: no adenopathy, no JVD, supple, symmetrical, trachea midline and thyroid not enlarged, symmetric, no tenderness/mass/nodules Lymph nodes: Cervical, supraclavicular, and axillary nodes normal. Resp: clear to auscultation bilaterally Back: symmetric, no curvature. ROM normal. No CVA tenderness. Cardio: regular rate and rhythm, S1, S2 normal, no murmur, click, rub or gallop GI: soft, non-tender; bowel sounds normal; no masses,  no organomegaly Extremities: extremities normal, atraumatic, no cyanosis or edema  ECOG PERFORMANCE STATUS: 1 - Symptomatic but completely ambulatory  Blood pressure 133/81, pulse 86, temperature 98.5 F (36.9 C), temperature source Oral, resp. rate 18, height 5\' 9"  (1.753 m), weight 173 lb 8 oz (78.7 kg), SpO2 99 %.  LABORATORY DATA: Lab Results  Component Value Date   WBC 7.7 12/31/2018   HGB 12.9 12/31/2018   HCT 41.1 12/31/2018   MCV 87.1 12/31/2018   PLT 240 12/31/2018      Chemistry      Component Value Date/Time   NA 141 12/31/2018 1100   NA 143 03/15/2017 1436   K 3.7 12/31/2018 1100   K 3.7 03/15/2017 1436   CL 106 12/31/2018 1100   CO2 24 12/31/2018 1100   CO2 34 (H) 03/15/2017 1436   BUN 23 12/31/2018 1100   BUN 17.8 03/15/2017 1436   CREATININE 0.84 12/31/2018 1100   CREATININE 0.8 03/15/2017 1436      Component Value Date/Time   CALCIUM 9.6 12/31/2018 1100   CALCIUM 9.6 03/15/2017 1436   ALKPHOS 139 (H) 12/31/2018 1100   ALKPHOS 88 03/15/2017 1436   AST 24 12/31/2018 1100   AST 16 03/15/2017 1436   ALT 19 12/31/2018 1100   ALT 15 03/15/2017 1436   BILITOT 0.7 12/31/2018 1100   BILITOT 0.52 03/15/2017 1436       RADIOGRAPHIC STUDIES: Ct Chest W Contrast  Result Date: 12/09/2018 CLINICAL DATA:  Lung cancer. EXAM: CT CHEST, ABDOMEN, AND PELVIS WITH CONTRAST TECHNIQUE: Multidetector CT imaging of the chest, abdomen and pelvis was performed following the  standard protocol during bolus administration of intravenous contrast. CONTRAST:  140mL OMNIPAQUE IOHEXOL 300 MG/ML  SOLN COMPARISON:  09/16/2018. FINDINGS: CT CHEST FINDINGS Cardiovascular: Atherosclerotic calcification of the aorta, aortic valve and coronary arteries. Pulmonic trunk is enlarged. Heart is at the upper limits of normal in size. No pericardial effusion. Extensive collateral venous flow is noted. Mediastinum/Nodes: Low-attenuation right thyroid nodule measures 13 mm, similar. Low left internal jugular lymph node measures 11 mm, stable. There is ill-defined soft tissue in the low right paratracheal and subcarinal stations, as before. No left hilar or axillary adenopathy. Esophagus is grossly unremarkable. Lungs/Pleura: Extensive parenchymal retraction and bronchiectasis in the upper/mid aspect of the medial right hemithorax, similar to the prior exam. There is a partially loculated moderate right pleural effusion, decreased in size from the prior. Extensive pleural thickening is seen in association with complex pleural fluid in the posterior mid right hemithorax. Multiple new hematogenously distributed  pulmonary nodules bilaterally with an index lesion measuring 8 mm in the left lower lobe (4/85). No left pleural fluid. There is narrowing of the bronchus intermedius, similar to the prior exam. Airway is otherwise unremarkable. Musculoskeletal: T12 compression fracture is unchanged. No worrisome lytic or sclerotic lesions. CT ABDOMEN PELVIS FINDINGS Hepatobiliary: Liver measures 19.9 cm liver is otherwise unremarkable. Stones layer in the gallbladder. No biliary ductal dilatation. Pancreas: Negative. Spleen: Negative. Adrenals/Urinary Tract: Fluid density adrenal nodules measure 1.6 cm on the right and 2.3 cm on the left, as before. Low-attenuation lesions in the kidneys are too small to characterize but statistically, cysts are likely. Ureters are decompressed. Bladder is grossly unremarkable.  Stomach/Bowel: Stomach, small bowel and colon are unremarkable. Appendix is not readily visualized. Vascular/Lymphatic: Atherosclerotic calcification of the aorta. Infrarenal aorta measures up to 2.7 cm. No pathologically enlarged lymph nodes. Reproductive: Hysterectomy.  No adnexal mass. Other: No free fluid. Small right inguinal hernia contains fat. Mesenteries and peritoneum are unremarkable. Musculoskeletal: Degenerative changes in the spine. No worrisome lytic or sclerotic lesions. IMPRESSION: 1. Worsening pulmonary metastatic disease. 2. Post treatment parenchymal retraction and bronchiectasis in the right hemithorax with narrowing of the bronchus intermedius, stable. Associated moderate loculated right pleural effusion, decreased in size. 3. Hepatomegaly. 4. Cholelithiasis. 5. Bilateral adrenal adenomas. 6. Aortic atherosclerosis (ICD10-170.0). Coronary artery calcification. 7. Ectatic abdominal aorta at risk for aneurysm development. Recommend followup by ultrasound in 5 years. This recommendation follows ACR consensus guidelines: White Paper of the ACR Incidental Findings Committee II on Vascular Findings. J Am Coll Radiol 2013; 10:789-794. 8. Enlarged pulmonic trunk, indicative of pulmonary arterial hypertension. Electronically Signed   By: Lorin Picket M.D.   On: 12/09/2018 13:36   Ct Abdomen Pelvis W Contrast  Result Date: 12/09/2018 CLINICAL DATA:  Lung cancer. EXAM: CT CHEST, ABDOMEN, AND PELVIS WITH CONTRAST TECHNIQUE: Multidetector CT imaging of the chest, abdomen and pelvis was performed following the standard protocol during bolus administration of intravenous contrast. CONTRAST:  140mL OMNIPAQUE IOHEXOL 300 MG/ML  SOLN COMPARISON:  09/16/2018. FINDINGS: CT CHEST FINDINGS Cardiovascular: Atherosclerotic calcification of the aorta, aortic valve and coronary arteries. Pulmonic trunk is enlarged. Heart is at the upper limits of normal in size. No pericardial effusion. Extensive collateral  venous flow is noted. Mediastinum/Nodes: Low-attenuation right thyroid nodule measures 13 mm, similar. Low left internal jugular lymph node measures 11 mm, stable. There is ill-defined soft tissue in the low right paratracheal and subcarinal stations, as before. No left hilar or axillary adenopathy. Esophagus is grossly unremarkable. Lungs/Pleura: Extensive parenchymal retraction and bronchiectasis in the upper/mid aspect of the medial right hemithorax, similar to the prior exam. There is a partially loculated moderate right pleural effusion, decreased in size from the prior. Extensive pleural thickening is seen in association with complex pleural fluid in the posterior mid right hemithorax. Multiple new hematogenously distributed pulmonary nodules bilaterally with an index lesion measuring 8 mm in the left lower lobe (4/85). No left pleural fluid. There is narrowing of the bronchus intermedius, similar to the prior exam. Airway is otherwise unremarkable. Musculoskeletal: T12 compression fracture is unchanged. No worrisome lytic or sclerotic lesions. CT ABDOMEN PELVIS FINDINGS Hepatobiliary: Liver measures 19.9 cm liver is otherwise unremarkable. Stones layer in the gallbladder. No biliary ductal dilatation. Pancreas: Negative. Spleen: Negative. Adrenals/Urinary Tract: Fluid density adrenal nodules measure 1.6 cm on the right and 2.3 cm on the left, as before. Low-attenuation lesions in the kidneys are too small to characterize but statistically, cysts are likely. Ureters are  decompressed. Bladder is grossly unremarkable. Stomach/Bowel: Stomach, small bowel and colon are unremarkable. Appendix is not readily visualized. Vascular/Lymphatic: Atherosclerotic calcification of the aorta. Infrarenal aorta measures up to 2.7 cm. No pathologically enlarged lymph nodes. Reproductive: Hysterectomy.  No adnexal mass. Other: No free fluid. Small right inguinal hernia contains fat. Mesenteries and peritoneum are unremarkable.  Musculoskeletal: Degenerative changes in the spine. No worrisome lytic or sclerotic lesions. IMPRESSION: 1. Worsening pulmonary metastatic disease. 2. Post treatment parenchymal retraction and bronchiectasis in the right hemithorax with narrowing of the bronchus intermedius, stable. Associated moderate loculated right pleural effusion, decreased in size. 3. Hepatomegaly. 4. Cholelithiasis. 5. Bilateral adrenal adenomas. 6. Aortic atherosclerosis (ICD10-170.0). Coronary artery calcification. 7. Ectatic abdominal aorta at risk for aneurysm development. Recommend followup by ultrasound in 5 years. This recommendation follows ACR consensus guidelines: White Paper of the ACR Incidental Findings Committee II on Vascular Findings. J Am Coll Radiol 2013; 10:789-794. 8. Enlarged pulmonic trunk, indicative of pulmonary arterial hypertension. Electronically Signed   By: Lorin Picket M.D.   On: 12/09/2018 13:36    ASSESSMENT AND PLAN: This is a very pleasant 78 years old white female recently diagnosed with probably stage IV non-small cell lung cancer presented with large right suprahilar lung mass in addition to mediastinal lymphadenopathy and suspicious malignant right pleural effusion.  The patient underwent systemic chemotherapy with carboplatin, paclitaxel and Keytruda status post 3 cycles.  She was tolerating this treatment well with no concerning complaints except for the last cycle when she was admitted to the hospital with C. difficile as well as dehydration and renal insufficiency.   She is currently on treatment with single agent Ketruda (pembrolizumab) status post 28 cycles.  The patient has been tolerating this treatment well with no concerning adverse effects. I recommended for her to proceed with cycle #29 today as planned. She will come back for follow-up visit in 3 weeks for evaluation before the next cycle of her treatment. For the hypothyroidism, she will continue on levothyroxine and we will  continue to monitor her TSH closely for adjustment of her medication as needed. The patient was advised to call immediately if she has any concerning symptoms in the interval. The patient voices understanding of current disease status and treatment options and is in agreement with the current care plan. All questions were answered. The patient knows to call the clinic with any problems, questions or concerns. We can certainly see the patient much sooner if necessary.  Disclaimer: This note was dictated with voice recognition software. Similar sounding words can inadvertently be transcribed and may not be corrected upon review.

## 2018-12-31 NOTE — Patient Instructions (Signed)
[facility] Discharge Instructions for Patients Receiving Immunotherapy  Today you received the following immunootherapy agents :  Keytruda/Pembrolizumab  To help prevent nausea and vomiting after your treatment, we encourage you to take your nausea medication if needed If you develop nausea and vomiting that is not controlled by your nausea medication, call the clinic.   BELOW ARE SYMPTOMS THAT SHOULD BE REPORTED IMMEDIATELY:  *FEVER GREATER THAN 100.5 F  *CHILLS WITH OR WITHOUT FEVER  NAUSEA AND VOMITING THAT IS NOT CONTROLLED WITH YOUR NAUSEA MEDICATION  *UNUSUAL SHORTNESS OF BREATH  *UNUSUAL BRUISING OR BLEEDING  TENDERNESS IN MOUTH AND THROAT WITH OR WITHOUT PRESENCE OF ULCERS  *URINARY PROBLEMS  *BOWEL PROBLEMS  UNUSUAL RASH Items with * indicate a potential emergency and should be followed up as soon as possible.  Feel free to call the clinic should you have any questions or concerns. The clinic phone number is [phone].  Please show the [facility] at check-in to the Emergency Department and triage nurse.

## 2019-01-01 ENCOUNTER — Telehealth: Payer: Self-pay | Admitting: Internal Medicine

## 2019-01-01 NOTE — Telephone Encounter (Signed)
Per 10/20 los appt already scheduled.

## 2019-01-21 ENCOUNTER — Other Ambulatory Visit: Payer: Self-pay

## 2019-01-21 ENCOUNTER — Encounter: Payer: Self-pay | Admitting: Internal Medicine

## 2019-01-21 ENCOUNTER — Inpatient Hospital Stay (HOSPITAL_BASED_OUTPATIENT_CLINIC_OR_DEPARTMENT_OTHER): Payer: Medicare Other | Admitting: Internal Medicine

## 2019-01-21 ENCOUNTER — Inpatient Hospital Stay: Payer: Medicare Other

## 2019-01-21 ENCOUNTER — Inpatient Hospital Stay: Payer: Medicare Other | Attending: Internal Medicine

## 2019-01-21 VITALS — BP 146/87 | HR 88 | Temp 97.9°F | Resp 19 | Ht 69.0 in | Wt 176.4 lb

## 2019-01-21 DIAGNOSIS — C3491 Malignant neoplasm of unspecified part of right bronchus or lung: Secondary | ICD-10-CM | POA: Insufficient documentation

## 2019-01-21 DIAGNOSIS — Z5112 Encounter for antineoplastic immunotherapy: Secondary | ICD-10-CM | POA: Insufficient documentation

## 2019-01-21 DIAGNOSIS — C382 Malignant neoplasm of posterior mediastinum: Secondary | ICD-10-CM

## 2019-01-21 DIAGNOSIS — Z79899 Other long term (current) drug therapy: Secondary | ICD-10-CM | POA: Insufficient documentation

## 2019-01-21 DIAGNOSIS — C3490 Malignant neoplasm of unspecified part of unspecified bronchus or lung: Secondary | ICD-10-CM

## 2019-01-21 LAB — CBC WITH DIFFERENTIAL (CANCER CENTER ONLY)
Abs Immature Granulocytes: 0.04 10*3/uL (ref 0.00–0.07)
Basophils Absolute: 0 10*3/uL (ref 0.0–0.1)
Basophils Relative: 1 %
Eosinophils Absolute: 0.1 10*3/uL (ref 0.0–0.5)
Eosinophils Relative: 1 %
HCT: 38.3 % (ref 36.0–46.0)
Hemoglobin: 12.1 g/dL (ref 12.0–15.0)
Immature Granulocytes: 1 %
Lymphocytes Relative: 20 %
Lymphs Abs: 1.3 10*3/uL (ref 0.7–4.0)
MCH: 27.2 pg (ref 26.0–34.0)
MCHC: 31.6 g/dL (ref 30.0–36.0)
MCV: 86.1 fL (ref 80.0–100.0)
Monocytes Absolute: 0.6 10*3/uL (ref 0.1–1.0)
Monocytes Relative: 10 %
Neutro Abs: 4.4 10*3/uL (ref 1.7–7.7)
Neutrophils Relative %: 67 %
Platelet Count: 222 10*3/uL (ref 150–400)
RBC: 4.45 MIL/uL (ref 3.87–5.11)
RDW: 14.6 % (ref 11.5–15.5)
WBC Count: 6.4 10*3/uL (ref 4.0–10.5)
nRBC: 0 % (ref 0.0–0.2)

## 2019-01-21 LAB — CMP (CANCER CENTER ONLY)
ALT: 19 U/L (ref 0–44)
AST: 24 U/L (ref 15–41)
Albumin: 3.3 g/dL — ABNORMAL LOW (ref 3.5–5.0)
Alkaline Phosphatase: 138 U/L — ABNORMAL HIGH (ref 38–126)
Anion gap: 9 (ref 5–15)
BUN: 19 mg/dL (ref 8–23)
CO2: 27 mmol/L (ref 22–32)
Calcium: 9.1 mg/dL (ref 8.9–10.3)
Chloride: 106 mmol/L (ref 98–111)
Creatinine: 0.76 mg/dL (ref 0.44–1.00)
GFR, Est AFR Am: >60 mL/min (ref >60)
GFR, Estimated: >60 mL/min (ref >60)
Glucose, Bld: 91 mg/dL (ref 70–99)
Potassium: 3.5 mmol/L (ref 3.5–5.1)
Sodium: 142 mmol/L (ref 135–145)
Total Bilirubin: 0.5 mg/dL (ref 0.3–1.2)
Total Protein: 7.9 g/dL (ref 6.5–8.1)

## 2019-01-21 LAB — TSH: TSH: 3.246 u[IU]/mL (ref 0.308–3.960)

## 2019-01-21 MED ORDER — SODIUM CHLORIDE 0.9 % IV SOLN
200.0000 mg | Freq: Once | INTRAVENOUS | Status: AC
  Administered 2019-01-21: 200 mg via INTRAVENOUS
  Filled 2019-01-21: qty 8

## 2019-01-21 MED ORDER — SODIUM CHLORIDE 0.9 % IV SOLN
Freq: Once | INTRAVENOUS | Status: AC
  Administered 2019-01-21: 12:00:00 via INTRAVENOUS
  Filled 2019-01-21: qty 250

## 2019-01-21 NOTE — Progress Notes (Signed)
[facility] [phone]   [phone]  OFFICE PROGRESS NOTE  [name], MD [address] [place] [address]  DIAGNOSIS: Metastatic non-small cell carcinoma (T3, N2, M1a) non-small cell lung cancer diagnosed in November 2018 and presented with large right suprahilar and mediastinal mass as well as recent right pleural effusion.  Guardant 360: No actionable mutations.  PRIOR THERAPY:  1) Palliative radiotherapy to the large mediastinal mass under the care of Dr. Lisbeth Renshaw. 2) Systemic chemotherapy with carboplatin for AUC of 5, paclitaxel 175 mg/M2 and Keytruda 200 mg IV every 3 weeks.  First dose expected May 01, 2017.  Status post 3 cycles.  CURRENT THERAPY: Maintenance treatment with immunotherapy with single agent Keytruda 200 mg IV every 3 weeks.  First cycle 07/24/2017.  Status post 29 cycles.  INTERVAL HISTORY: Cassidy Sanders 78 y.o. female returns to the clinic today for follow-up visit.  The patient is feeling fine today with no concerning complaints except for the rash on the left side of the chest and arm.  She applied hydrocortisone cream to it and use Atarax with some mild improvement but she has not been doing it regularly.  The patient denied having any current chest pain, shortness of breath, cough or hemoptysis.  She denied having any fever or chills.  She has no nausea, vomiting, diarrhea or constipation.  She has no headache or visual changes.  She is here today for evaluation before starting cycle #30 of her treatment.  MEDICAL HISTORY: Past Medical History:  Diagnosis Date  . Arrhythmia   . Atrial fibrillation ([place])   . Brain tumor (benign) ([place])   . Depression   . Dyspnea    home oxygen  . Dysrhythmia    a fib  . History of home oxygen therapy 02/2017   2 L/minute  . Hypertension     ALLERGIES:  is allergic to latex; gabapentin; and tape.  MEDICATIONS:  Current Outpatient Medications  Medication Sig  Dispense Refill  . albuterol (PROVENTIL) (2.5 MG/3ML) 0.083% nebulizer solution VVN Q 4 H PRF WHEEZING OR SHORTNESS OF BREATH 360 mL 2  . Biotin 2.5 MG TABS Take 2,500 mcg by mouth daily.    . clobetasol cream (TEMOVATE) 2.68 % Apply 1 application topically 2 (two) times daily. 60 g 0  . Dermatological Products, Misc. (GENADUR) KIT Take by mouth.    . diphenhydrAMINE (BENADRYL) 25 mg capsule Take 1 capsule (25 mg total) by mouth at bedtime as needed for sleep. 30 capsule 0  . hydrOXYzine (ATARAX/VISTARIL) 10 MG tablet Take 1 tablet (10 mg total) by mouth 3 (three) times daily as needed for itching. 30 tablet 1  . ketoconazole (NIZORAL) 2 % shampoo APP EXT 3 TIMES A WK    . levalbuterol (XOPENEX) 0.63 MG/3ML nebulizer solution Take 3 mLs (0.63 mg total) by nebulization every 6 (six) hours as needed for wheezing or shortness of breath. 3 mL 12  . levothyroxine (SYNTHROID) 75 MCG tablet TAKE 1 TABLET(75 MCG) BY MOUTH DAILY BEFORE BREAKFAST 30 tablet 2  . metoprolol tartrate (LOPRESSOR) 25 MG tablet Take 25 mg by mouth daily.     . mupirocin ointment (BACTROBAN) 2 % APP EXT AA TID    . Respiratory Therapy Supplies (FLUTTER) DEVI Use as directed. 1 each 0  . sertraline (ZOLOFT) 50 MG tablet Take 50 mg by mouth daily.     No current facility-administered medications for this visit.     SURGICAL HISTORY:  Past Surgical History:  Procedure Laterality Date  . ABDOMINAL HYSTERECTOMY    . BLADDER SURGERY    . BRAIN SURGERY    . ENDOBRONCHIAL ULTRASOUND Bilateral 02/06/2017   Procedure: ENDOBRONCHIAL ULTRASOUND;  Surgeon: Juanito Doom, MD;  Location: [facility] ENDOSCOPY;  Service: Cardiopulmonary;  Laterality: Bilateral;  . IR GUIDED DRAIN W CATHETER PLACEMENT  06/29/2017  . IR REMOVAL OF PLURAL CATH W/CUFF  08/28/2017  . IR US GUIDE BX ASP/DRAIN  06/29/2017  . VIDEO BRONCHOSCOPY WITH ENDOBRONCHIAL ULTRASOUND N/A 03/23/2017   Procedure: VIDEO BRONCHOSCOPY WITH ENDOBRONCHIAL ULTRASOUND;  Surgeon:  Juanito Doom, MD;  Location: [facility] OR;  Service: Thoracic;  Laterality: N/A;    REVIEW OF SYSTEMS:  A comprehensive review of systems was negative except for: Integument/breast: positive for rash   PHYSICAL EXAMINATION: General appearance: alert, cooperative and no distress Head: Normocephalic, without obvious abnormality, atraumatic Neck: no adenopathy, no JVD, supple, symmetrical, trachea midline and thyroid not enlarged, symmetric, no tenderness/mass/nodules Lymph nodes: Cervical, supraclavicular, and axillary nodes normal. Resp: clear to auscultation bilaterally Back: symmetric, no curvature. ROM normal. No CVA tenderness. Cardio: regular rate and rhythm, S1, S2 normal, no murmur, click, rub or gallop GI: soft, non-tender; bowel sounds normal; no masses,  no organomegaly Extremities: extremities normal, atraumatic, no cyanosis or edema  ECOG PERFORMANCE STATUS: 1 - Symptomatic but completely ambulatory  Blood pressure (!) 146/87, pulse 88, temperature 97.9 F (36.6 C), temperature source Temporal, resp. rate 19, height _0  (1.753 m), weight 176 lb 6.4 oz (80 kg), SpO2 96 %.  LABORATORY DATA: Lab Results  Component Value Date   WBC 6.4 01/21/2019   HGB 12.1 01/21/2019   HCT 38.3 01/21/2019   MCV 86.1 01/21/2019   PLT 222 01/21/2019      Chemistry      Component Value Date/Time   NA 141 12/31/2018 1100   NA 143 03/15/2017 1436   K 3.7 12/31/2018 1100   K 3.7 03/15/2017 1436   CL 106 12/31/2018 1100   CO2 24 12/31/2018 1100   CO2 34 (H) 03/15/2017 1436   BUN 23 12/31/2018 1100   BUN 17.8 03/15/2017 1436   CREATININE 0.84 12/31/2018 1100   CREATININE 0.8 03/15/2017 1436      Component Value Date/Time   CALCIUM 9.6 12/31/2018 1100   CALCIUM 9.6 03/15/2017 1436   ALKPHOS 139 (H) 12/31/2018 1100   ALKPHOS 88 03/15/2017 1436   AST 24 12/31/2018 1100   AST 16 03/15/2017 1436   ALT 19 12/31/2018 1100   ALT 15 03/15/2017 1436   BILITOT 0.7 12/31/2018 1100    BILITOT 0.52 03/15/2017 1436       RADIOGRAPHIC STUDIES: No results found.  ASSESSMENT AND PLAN: This is a very pleasant 78 years old white female recently diagnosed with probably stage IV non-small cell lung cancer presented with large right suprahilar lung mass in addition to mediastinal lymphadenopathy and suspicious malignant right pleural effusion.  The patient underwent systemic chemotherapy with carboplatin, paclitaxel and Keytruda status post 3 cycles.  She was tolerating this treatment well with no concerning complaints except for the last cycle when she was admitted to the hospital with C. difficile as well as dehydration and renal insufficiency.   She is currently on treatment with single agent Ketruda (pembrolizumab) status post 29 cycles.  She has been tolerating this treatment well with no concerning adverse effect except for the itching and rash on the left side of the chest as well as arm.  I  had a lengthy discussion with the patient about her condition.  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  I gave the patient the option of holding her treatment at this point with [facility] and starting her on high-dose steroid but the patient declined this option and she would like to continue with her treatment and handled the rash with the topical hydrocortisone and Atarax. I will see her back for follow-up visit in 3 weeks for evaluation before the next cycle of her treatment. For the hypothyroidism, she will continue on levothyroxine and we will continue to monitor her TSH closely for adjustment of her medication as needed. She was advised to call immediately if she has any concerning symptoms in the interval. The patient voices understanding of current disease status and treatment options and is in agreement with the current care plan. All questions were answered. The patient knows to call the clinic with any problems, questions or concerns. We can certainly see the patient much  sooner if necessary.  Disclaimer: This note was dictated with voice recognition software. Similar sounding words can inadvertently be transcribed and may not be corrected upon review.

## 2019-01-21 NOTE — Patient Instructions (Signed)
[facility] Discharge Instructions for Patients Receiving Chemotherapy  Today you received the following Immunotherapy: Pembrolizumab  To help prevent nausea and vomiting after your treatment, we encourage you to take your nausea medication as directed by your MD.   If you develop nausea and vomiting that is not controlled by your nausea medication, call the clinic.   BELOW ARE SYMPTOMS THAT SHOULD BE REPORTED IMMEDIATELY:  *FEVER GREATER THAN 100.5 F  *CHILLS WITH OR WITHOUT FEVER  NAUSEA AND VOMITING THAT IS NOT CONTROLLED WITH YOUR NAUSEA MEDICATION  *UNUSUAL SHORTNESS OF BREATH  *UNUSUAL BRUISING OR BLEEDING  TENDERNESS IN MOUTH AND THROAT WITH OR WITHOUT PRESENCE OF ULCERS  *URINARY PROBLEMS  *BOWEL PROBLEMS  UNUSUAL RASH Items with * indicate a potential emergency and should be followed up as soon as possible.  Feel free to call the clinic should you have any questions or concerns. The clinic phone number is [phone].  Please show the [facility] at check-in to the Emergency Department and triage nurse. Coronavirus (COVID-19) Are you at risk?  Are you at risk for the Coronavirus (COVID-19)?  To be considered HIGH RISK for Coronavirus (COVID-19), you have to meet the following criteria:  . Traveled to [place], [place], [place], [place] or [place]; or in the [place] to [place], [place], [place], or [place]; and have fever, cough, and shortness of breath within the last 2 weeks of travel OR . Been in close contact with a person diagnosed with COVID-19 within the last 2 weeks and have fever, cough, and shortness of breath . IF YOU DO NOT MEET THESE CRITERIA, YOU ARE CONSIDERED LOW RISK FOR COVID-19.  What to do if you are HIGH RISK for COVID-19?  Marland Kitchen If you are having a medical emergency, call 911. . Seek medical care right away. Before you go to a doctor's office, urgent care or emergency department, call ahead and tell them about  your recent travel, contact with someone diagnosed with COVID-19, and your symptoms. You should receive instructions from your physician's office regarding next steps of care.  . When you arrive at healthcare provider, tell the healthcare staff immediately you have returned from visiting [place], [place], [place], [place] or [place]; or traveled in the [place] to [place], [place], [place], or [place]; in the last two weeks or you have been in close contact with a person diagnosed with COVID-19 in the last 2 weeks.   . Tell the health care staff about your symptoms: fever, cough and shortness of breath. . After you have been seen by a medical provider, you will be either: o Tested for (COVID-19) and discharged home on quarantine except to seek medical care if symptoms worsen, and asked to  - Stay home and avoid contact with others until you get your results (4-5 days)  - Avoid travel on public transportation if possible (such as bus, train, or airplane) or o Sent to the Emergency Department by EMS for evaluation, COVID-19 testing, and possible admission depending on your condition and test results.  What to do if you are LOW RISK for COVID-19?  Reduce your risk of any infection by using the same precautions used for avoiding the common cold or flu:  Marland Kitchen Wash your hands often with soap and warm water for at least 20 seconds.  If soap and water are not readily available, use an alcohol-based hand sanitizer with at least 60% alcohol.  . If coughing or  sneezing, cover your mouth and nose by coughing or sneezing into the elbow areas of your shirt or coat, into a tissue or into your sleeve (not your hands). . Avoid shaking hands with others and consider head nods or verbal greetings only. . Avoid touching your eyes, nose, or mouth with unwashed hands.  . Avoid close contact with people who are sick. . Avoid places or events with large numbers of people in one location, like concerts or sporting  events. . Carefully consider travel plans you have or are making. . If you are planning any travel outside or inside the [place], visit the CDC's Travelers' Health webpage for the latest health notices. . If you have some symptoms but not all symptoms, continue to monitor at home and seek medical attention if your symptoms worsen. . If you are having a medical emergency, call 911.   [place] / e-Visit: [email or web address]         [facility]: [facility]: [phone]                   [facility]: [phone]

## 2019-02-10 ENCOUNTER — Inpatient Hospital Stay: Payer: Medicare Other

## 2019-02-10 ENCOUNTER — Encounter: Payer: Self-pay | Admitting: Internal Medicine

## 2019-02-10 ENCOUNTER — Other Ambulatory Visit: Payer: Self-pay

## 2019-02-10 ENCOUNTER — Inpatient Hospital Stay (HOSPITAL_BASED_OUTPATIENT_CLINIC_OR_DEPARTMENT_OTHER): Payer: Medicare Other | Admitting: Internal Medicine

## 2019-02-10 VITALS — BP 135/84 | HR 87 | Temp 98.7°F | Resp 18 | Ht 69.0 in | Wt 174.4 lb

## 2019-02-10 DIAGNOSIS — L270 Generalized skin eruption due to drugs and medicaments taken internally: Secondary | ICD-10-CM | POA: Diagnosis not present

## 2019-02-10 DIAGNOSIS — Z5112 Encounter for antineoplastic immunotherapy: Secondary | ICD-10-CM | POA: Diagnosis not present

## 2019-02-10 DIAGNOSIS — C382 Malignant neoplasm of posterior mediastinum: Secondary | ICD-10-CM

## 2019-02-10 DIAGNOSIS — C3490 Malignant neoplasm of unspecified part of unspecified bronchus or lung: Secondary | ICD-10-CM

## 2019-02-10 DIAGNOSIS — C3491 Malignant neoplasm of unspecified part of right bronchus or lung: Secondary | ICD-10-CM

## 2019-02-10 DIAGNOSIS — E039 Hypothyroidism, unspecified: Secondary | ICD-10-CM

## 2019-02-10 DIAGNOSIS — Z79899 Other long term (current) drug therapy: Secondary | ICD-10-CM | POA: Diagnosis not present

## 2019-02-10 LAB — CBC WITH DIFFERENTIAL (CANCER CENTER ONLY)
Abs Immature Granulocytes: 0.05 10*3/uL (ref 0.00–0.07)
Basophils Absolute: 0.1 10*3/uL (ref 0.0–0.1)
Basophils Relative: 1 %
Eosinophils Absolute: 0 10*3/uL (ref 0.0–0.5)
Eosinophils Relative: 1 %
HCT: 39.1 % (ref 36.0–46.0)
Hemoglobin: 12.1 g/dL (ref 12.0–15.0)
Immature Granulocytes: 1 %
Lymphocytes Relative: 14 %
Lymphs Abs: 1.2 10*3/uL (ref 0.7–4.0)
MCH: 26.7 pg (ref 26.0–34.0)
MCHC: 30.9 g/dL (ref 30.0–36.0)
MCV: 86.1 fL (ref 80.0–100.0)
Monocytes Absolute: 0.9 10*3/uL (ref 0.1–1.0)
Monocytes Relative: 11 %
Neutro Abs: 6.3 10*3/uL (ref 1.7–7.7)
Neutrophils Relative %: 72 %
Platelet Count: 251 10*3/uL (ref 150–400)
RBC: 4.54 MIL/uL (ref 3.87–5.11)
RDW: 14.5 % (ref 11.5–15.5)
WBC Count: 8.5 10*3/uL (ref 4.0–10.5)
nRBC: 0 % (ref 0.0–0.2)

## 2019-02-10 LAB — TSH: TSH: 4.531 u[IU]/mL — ABNORMAL HIGH (ref 0.308–3.960)

## 2019-02-10 LAB — CMP (CANCER CENTER ONLY)
ALT: 23 U/L (ref 0–44)
AST: 25 U/L (ref 15–41)
Albumin: 3.2 g/dL — ABNORMAL LOW (ref 3.5–5.0)
Alkaline Phosphatase: 143 U/L — ABNORMAL HIGH (ref 38–126)
Anion gap: 10 (ref 5–15)
BUN: 16 mg/dL (ref 8–23)
CO2: 26 mmol/L (ref 22–32)
Calcium: 9.1 mg/dL (ref 8.9–10.3)
Chloride: 105 mmol/L (ref 98–111)
Creatinine: 0.79 mg/dL (ref 0.44–1.00)
GFR, Est AFR Am: >60 mL/min (ref >60)
GFR, Estimated: >60 mL/min (ref >60)
Glucose, Bld: 77 mg/dL (ref 70–99)
Potassium: 3.9 mmol/L (ref 3.5–5.1)
Sodium: 141 mmol/L (ref 135–145)
Total Bilirubin: 0.7 mg/dL (ref 0.3–1.2)
Total Protein: 8 g/dL (ref 6.5–8.1)

## 2019-02-10 MED ORDER — SODIUM CHLORIDE 0.9 % IV SOLN
Freq: Once | INTRAVENOUS | Status: AC
  Administered 2019-02-10: 13:00:00 via INTRAVENOUS
  Filled 2019-02-10: qty 250

## 2019-02-10 MED ORDER — SODIUM CHLORIDE 0.9 % IV SOLN
200.0000 mg | Freq: Once | INTRAVENOUS | Status: AC
  Administered 2019-02-10: 200 mg via INTRAVENOUS
  Filled 2019-02-10: qty 8

## 2019-02-10 MED ORDER — METHYLPREDNISOLONE 4 MG PO TBPK: ORAL_TABLET | ORAL | 0 refills | Status: DC

## 2019-02-10 NOTE — Patient Instructions (Signed)
[facility] Discharge Instructions for Patients Receiving Chemotherapy  Today you received the following Immunotherapy: Pembrolizumab  To help prevent nausea and vomiting after your treatment, we encourage you to take your nausea medication as directed by your MD.   If you develop nausea and vomiting that is not controlled by your nausea medication, call the clinic.   BELOW ARE SYMPTOMS THAT SHOULD BE REPORTED IMMEDIATELY:  *FEVER GREATER THAN 100.5 F  *CHILLS WITH OR WITHOUT FEVER  NAUSEA AND VOMITING THAT IS NOT CONTROLLED WITH YOUR NAUSEA MEDICATION  *UNUSUAL SHORTNESS OF BREATH  *UNUSUAL BRUISING OR BLEEDING  TENDERNESS IN MOUTH AND THROAT WITH OR WITHOUT PRESENCE OF ULCERS  *URINARY PROBLEMS  *BOWEL PROBLEMS  UNUSUAL RASH Items with * indicate a potential emergency and should be followed up as soon as possible.  Feel free to call the clinic should you have any questions or concerns. The clinic phone number is [phone].  Please show the [facility] at check-in to the Emergency Department and triage nurse. Coronavirus (COVID-19) Are you at risk?  Are you at risk for the Coronavirus (COVID-19)?  To be considered HIGH RISK for Coronavirus (COVID-19), you have to meet the following criteria:  . Traveled to [place], [place], [place], [place] or [place]; or in the [place] to [place], [place], [place], or [place]; and have fever, cough, and shortness of breath within the last 2 weeks of travel OR . Been in close contact with a person diagnosed with COVID-19 within the last 2 weeks and have fever, cough, and shortness of breath . IF YOU DO NOT MEET THESE CRITERIA, YOU ARE CONSIDERED LOW RISK FOR COVID-19.  What to do if you are HIGH RISK for COVID-19?  Marland Kitchen If you are having a medical emergency, call 911. . Seek medical care right away. Before you go to a doctor's office, urgent care or emergency department, call ahead and tell them about  your recent travel, contact with someone diagnosed with COVID-19, and your symptoms. You should receive instructions from your physician's office regarding next steps of care.  . When you arrive at healthcare provider, tell the healthcare staff immediately you have returned from visiting [place], [place], [place], [place] or [place]; or traveled in the [place] to [place], [place], [place], or [place]; in the last two weeks or you have been in close contact with a person diagnosed with COVID-19 in the last 2 weeks.   . Tell the health care staff about your symptoms: fever, cough and shortness of breath. . After you have been seen by a medical provider, you will be either: o Tested for (COVID-19) and discharged home on quarantine except to seek medical care if symptoms worsen, and asked to  - Stay home and avoid contact with others until you get your results (4-5 days)  - Avoid travel on public transportation if possible (such as bus, train, or airplane) or o Sent to the Emergency Department by EMS for evaluation, COVID-19 testing, and possible admission depending on your condition and test results.  What to do if you are LOW RISK for COVID-19?  Reduce your risk of any infection by using the same precautions used for avoiding the common cold or flu:  Marland Kitchen Wash your hands often with soap and warm water for at least 20 seconds.  If soap and water are not readily available, use an alcohol-based hand sanitizer with at least 60% alcohol.  . If coughing or  sneezing, cover your mouth and nose by coughing or sneezing into the elbow areas of your shirt or coat, into a tissue or into your sleeve (not your hands). . Avoid shaking hands with others and consider head nods or verbal greetings only. . Avoid touching your eyes, nose, or mouth with unwashed hands.  . Avoid close contact with people who are sick. . Avoid places or events with large numbers of people in one location, like concerts or sporting  events. . Carefully consider travel plans you have or are making. . If you are planning any travel outside or inside the [place], visit the CDC's Travelers' Health webpage for the latest health notices. . If you have some symptoms but not all symptoms, continue to monitor at home and seek medical attention if your symptoms worsen. . If you are having a medical emergency, call 911.   [facility] / e-Visit: [email or web address]         [facility]: [facility]: [phone]                   [facility]: [phone]

## 2019-02-10 NOTE — Progress Notes (Signed)
02/10/19  Confirmed dose of Keytruda to be given today.  T.O. Dr Margarito Courser Felton Clinton RN/Kalei Mckillop Ronnald Ramp, PharmD

## 2019-02-10 NOTE — Progress Notes (Signed)
[facility] [phone]   [phone]  OFFICE PROGRESS NOTE  [name], MD [address] [place] [place] [address]  DIAGNOSIS: Metastatic non-small cell carcinoma (T3, N2, M1a) non-small cell lung cancer diagnosed in November 2018 and presented with large right suprahilar and mediastinal mass as well as recent right pleural effusion.  Guardant 360: No actionable mutations.  PRIOR THERAPY:  1) Palliative radiotherapy to the large mediastinal mass under the care of Dr. Lisbeth Renshaw. 2) Systemic chemotherapy with carboplatin for AUC of 5, paclitaxel 175 mg/M2 and Keytruda 200 mg IV every 3 weeks.  First dose expected May 01, 2017.  Status post 3 cycles.  CURRENT THERAPY: Maintenance treatment with immunotherapy with single agent Keytruda 200 mg IV every 3 weeks.  First cycle 07/24/2017.  Status post 30 cycles.  INTERVAL HISTORY: Cassidy Sanders 78 y.o. female returns to the clinic today for follow-up visit.  The patient is feeling fine today with no concerning complaints except for persistent mild cough and shortness of breath especially with exertion.  She denied having any current chest pain or hemoptysis.  She denied having any fever or chills.  She has no nausea, vomiting, diarrhea or constipation.  She denied having any headache or visual changes.  She continues to complain of rash on the left breast area and she is now interested in some form of treatment with steroid if needed.  She is here today for evaluation before starting cycle #30 one of her treatment.  MEDICAL HISTORY: Past Medical History:  Diagnosis Date  . Arrhythmia   . Atrial fibrillation ([facility])   . Brain tumor (benign) ([facility])   . Depression   . Dyspnea    home oxygen  . Dysrhythmia    a fib  . History of home oxygen therapy 02/2017   2 L/minute  . Hypertension     ALLERGIES:  is allergic to latex; gabapentin; and tape.  MEDICATIONS:  Current Outpatient Medications   Medication Sig Dispense Refill  . albuterol (PROVENTIL) (2.5 MG/3ML) 0.083% nebulizer solution VVN Q 4 H PRF WHEEZING OR SHORTNESS OF BREATH 360 mL 2  . Biotin 2.5 MG TABS Take 2,500 mcg by mouth daily.    . clobetasol cream (TEMOVATE) 1.97 % Apply 1 application topically 2 (two) times daily. 60 g 0  . diphenhydrAMINE (BENADRYL) 25 mg capsule Take 1 capsule (25 mg total) by mouth at bedtime as needed for sleep. 30 capsule 0  . hydrOXYzine (ATARAX/VISTARIL) 10 MG tablet Take 1 tablet (10 mg total) by mouth 3 (three) times daily as needed for itching. 30 tablet 1  . ketoconazole (NIZORAL) 2 % shampoo APP EXT 3 TIMES A WK    . levalbuterol (XOPENEX) 0.63 MG/3ML nebulizer solution Take 3 mLs (0.63 mg total) by nebulization every 6 (six) hours as needed for wheezing or shortness of breath. 3 mL 12  . levothyroxine (SYNTHROID) 75 MCG tablet TAKE 1 TABLET(75 MCG) BY MOUTH DAILY BEFORE BREAKFAST 30 tablet 2  . metoprolol tartrate (LOPRESSOR) 25 MG tablet Take 25 mg by mouth daily.     . mupirocin ointment (BACTROBAN) 2 % APP EXT AA TID    . Respiratory Therapy Supplies (FLUTTER) DEVI Use as directed. 1 each 0  . sertraline (ZOLOFT) 50 MG tablet Take 50 mg by mouth daily.     No current facility-administered medications for this visit.     SURGICAL HISTORY:  Past Surgical History:  Procedure Laterality Date  . ABDOMINAL HYSTERECTOMY    .  BLADDER SURGERY    . BRAIN SURGERY    . ENDOBRONCHIAL ULTRASOUND Bilateral 02/06/2017   Procedure: ENDOBRONCHIAL ULTRASOUND;  Surgeon: Juanito Doom, MD;  Location: [facility];  Service: Cardiopulmonary;  Laterality: Bilateral;  . IR GUIDED DRAIN W CATHETER PLACEMENT  06/29/2017  . IR REMOVAL OF PLURAL CATH W/CUFF  08/28/2017  . IR US GUIDE BX ASP/DRAIN  06/29/2017  . VIDEO BRONCHOSCOPY WITH ENDOBRONCHIAL ULTRASOUND N/A 03/23/2017   Procedure: VIDEO BRONCHOSCOPY WITH ENDOBRONCHIAL ULTRASOUND;  Surgeon: Juanito Doom, MD;  Location: [facility];  Service:  Thoracic;  Laterality: N/A;    REVIEW OF SYSTEMS:  Constitutional: negative Eyes: negative Ears, nose, mouth, throat, and face: negative Respiratory: positive for dyspnea on exertion Cardiovascular: negative Gastrointestinal: negative Genitourinary:negative Integument/breast: positive for rash Hematologic/lymphatic: negative Musculoskeletal:negative Neurological: negative Behavioral/Psych: negative Endocrine: negative Allergic/Immunologic: negative   PHYSICAL EXAMINATION: General appearance: alert, cooperative and no distress Head: Normocephalic, without obvious abnormality, atraumatic Neck: no adenopathy, no JVD, supple, symmetrical, trachea midline and thyroid not enlarged, symmetric, no tenderness/mass/nodules Lymph nodes: Cervical, supraclavicular, and axillary nodes normal. Resp: clear to auscultation bilaterally Back: symmetric, no curvature. ROM normal. No CVA tenderness. Cardio: regular rate and rhythm, S1, S2 normal, no murmur, click, rub or gallop GI: soft, non-tender; bowel sounds normal; no masses,  no organomegaly Extremities: extremities normal, atraumatic, no cyanosis or edema Neurologic: Alert and oriented X 3, normal strength and tone. Normal symmetric reflexes. Normal coordination and gait  ECOG PERFORMANCE STATUS: 1 - Symptomatic but completely ambulatory  Blood pressure 135/84, pulse 87, temperature 98.7 F (37.1 C), temperature source Oral, resp. rate 18, height 5\' 9"  (1.753 m), weight 174 lb 6.4 oz (79.1 kg), SpO2 95 %.  LABORATORY DATA: Lab Results  Component Value Date   WBC 8.5 02/10/2019   HGB 12.1 02/10/2019   HCT 39.1 02/10/2019   MCV 86.1 02/10/2019   PLT 251 02/10/2019      Chemistry      Component Value Date/Time   NA 141 02/10/2019 1123   NA 143 03/15/2017 1436   K 3.9 02/10/2019 1123   K 3.7 03/15/2017 1436   CL 105 02/10/2019 1123   CO2 26 02/10/2019 1123   CO2 34 (H) 03/15/2017 1436   BUN 16 02/10/2019 1123   BUN 17.8  03/15/2017 1436   CREATININE 0.79 02/10/2019 1123   CREATININE 0.8 03/15/2017 1436      Component Value Date/Time   CALCIUM 9.1 02/10/2019 1123   CALCIUM 9.6 03/15/2017 1436   ALKPHOS 143 (H) 02/10/2019 1123   ALKPHOS 88 03/15/2017 1436   AST 25 02/10/2019 1123   AST 16 03/15/2017 1436   ALT 23 02/10/2019 1123   ALT 15 03/15/2017 1436   BILITOT 0.7 02/10/2019 1123   BILITOT 0.52 03/15/2017 1436       RADIOGRAPHIC STUDIES: No results found.  ASSESSMENT AND PLAN: This is a very pleasant 78 years old white female recently diagnosed with probably stage IV non-small cell lung cancer presented with large right suprahilar lung mass in addition to mediastinal lymphadenopathy and suspicious malignant right pleural effusion.  The patient underwent systemic chemotherapy with carboplatin, paclitaxel and Keytruda status post 3 cycles.  She was tolerating this treatment well with no concerning complaints except for the last cycle when she was admitted to the hospital with C. difficile as well as dehydration and renal insufficiency.   She is currently on treatment with single agent Ketruda (pembrolizumab) status post 30 cycles.  The patient continues to tolerate her  treatment well except for the persistent rash on the left side of the chest and left arm.  She is now interested in considering some form of treatment with prednisone and I will start her on Medrol Dosepak for the rash. She will proceed with cycle #31 today as planned. I will see her back for follow-up visit in 3 weeks for evaluation with repeat CT scan of the chest, abdomen and pelvis for restaging of her disease. For the hypothyroidism, she will continue her current treatment with levothyroxine and will continue to monitor her TSH closely and adjust her dose as needed. The patient was advised to call immediately if she has any concerning symptoms in the interval.  The patient voices understanding of current disease status and treatment  options and is in agreement with the current care plan. All questions were answered. The patient knows to call the clinic with any problems, questions or concerns. We can certainly see the patient much sooner if necessary.  Disclaimer: This note was dictated with voice recognition software. Similar sounding words can inadvertently be transcribed and may not be corrected upon review.

## 2019-02-11 ENCOUNTER — Telehealth: Payer: Self-pay | Admitting: Internal Medicine

## 2019-02-11 NOTE — Telephone Encounter (Signed)
Scheduled per los. Called and spoke with patient. Confirmed appt 

## 2019-02-18 ENCOUNTER — Other Ambulatory Visit: Payer: Self-pay | Admitting: Internal Medicine

## 2019-02-28 ENCOUNTER — Other Ambulatory Visit: Payer: Self-pay

## 2019-02-28 ENCOUNTER — Encounter (HOSPITAL_COMMUNITY): Payer: Self-pay

## 2019-02-28 ENCOUNTER — Ambulatory Visit (HOSPITAL_COMMUNITY)
Admission: RE | Admit: 2019-02-28 | Discharge: 2019-02-28 | Disposition: A | Discharge status: Home / Self Care | Payer: Medicare Other | Source: Ambulatory Visit | Attending: Internal Medicine | Admitting: Internal Medicine

## 2019-02-28 DIAGNOSIS — C3490 Malignant neoplasm of unspecified part of unspecified bronchus or lung: Secondary | ICD-10-CM | POA: Diagnosis present

## 2019-02-28 MED ORDER — IOHEXOL 300 MG/ML  SOLN
100.0000 mL | Freq: Once | INTRAMUSCULAR | Status: AC | PRN
  Administered 2019-02-28: 100 mL via INTRAVENOUS

## 2019-02-28 MED ORDER — SODIUM CHLORIDE (PF) 0.9 % IJ SOLN
INTRAMUSCULAR | Status: AC
  Filled 2019-02-28: qty 50

## 2019-03-03 NOTE — Progress Notes (Signed)
[facility] OFFICE PROGRESS NOTE  Cassidy Panda, MD [address] [place] [address]  DIAGNOSIS: Metastatic non-small cell carcinoma (T3, N2, M1a) non-small cell lung cancer diagnosed in November 2018 and presented with large right suprahilar and mediastinal mass as well as recent right pleural effusion.  Guardant 360: No actionable mutations.  PRIOR THERAPY:  1) Palliative radiotherapy to the large mediastinal mass under the care of Dr. Lisbeth Renshaw. 2) Systemic chemotherapy with carboplatin for AUC of 5, paclitaxel 175 mg/M2 and Keytruda 200 mg IV every 3 weeks.  First dose expected May 01, 2017.  Status post 3 cycles.  CURRENT THERAPY: Maintenance treatment with immunotherapy with single agent Keytruda 200 mg IV every 3 weeks.  First cycle 07/24/2017.  Status post 31 cycles  INTERVAL HISTORY: Cassidy Sanders 78 y.o. female returns to the clinic for a follow up visit. The patient is feeling well today without any concerning complaints except for a persistent skin rash on her anterior chest wall which has been present for several months. She recently completed a medrol dose pak which helped her symptoms; however, she reported that as soon as she completed her taper, her symptoms reoccurred. She also has itching on her scalp. She has seen dermatology in the past. She has used prescription shampoo as well as head and shoulders without relief of her scalp itching. She uses atarax and topical steroids for her itching. Otherwise, she is feeling well. She denies any fever, chills, night sweats, or weight loss. Denies any chest pain or hemoptysis. She reports her baseline productive cough that produces "yellowish" sputum. She states that she experiences wheezing once every few weeks which is managed with her nebulizer. Denies any nausea, vomiting, diarrhea, or constipation. Denies any headache or visual changes. The patient recently had a restaging CT scan performed. The patient is here  today for evaluation prior to starting cycle # 32   MEDICAL HISTORY: Past Medical History:  Diagnosis Date  . Arrhythmia   . Atrial fibrillation ([place])   . Brain tumor (benign) ([place])   . Depression   . Dyspnea    home oxygen  . Dysrhythmia    a fib  . History of home oxygen therapy 02/2017   2 L/minute  . Hypertension     ALLERGIES:  is allergic to latex; gabapentin; and tape.  MEDICATIONS:  Current Outpatient Medications  Medication Sig Dispense Refill  . albuterol (PROVENTIL) (2.5 MG/3ML) 0.083% nebulizer solution VVN Q 4 H PRF WHEEZING OR SHORTNESS OF BREATH 360 mL 2  . Biotin 2.5 MG TABS Take 2,500 mcg by mouth daily.    . clobetasol cream (TEMOVATE) 3.15 % Apply 1 application topically 2 (two) times daily. 60 g 0  . diphenhydrAMINE (BENADRYL) 25 mg capsule Take 1 capsule (25 mg total) by mouth at bedtime as needed for sleep. 30 capsule 0  . hydrOXYzine (ATARAX/VISTARIL) 10 MG tablet Take 1 tablet (10 mg total) by mouth 3 (three) times daily as needed for itching. 90 tablet 1  . ketoconazole (NIZORAL) 2 % shampoo APP EXT 3 TIMES A WK    . levalbuterol (XOPENEX) 0.63 MG/3ML nebulizer solution Take 3 mLs (0.63 mg total) by nebulization every 6 (six) hours as needed for wheezing or shortness of breath. 3 mL 12  . levothyroxine (SYNTHROID) 75 MCG tablet TAKE 1 TABLET(75 MCG) BY MOUTH DAILY BEFORE BREAKFAST 30 tablet 2  . metoprolol tartrate (LOPRESSOR) 25 MG tablet Take 25 mg by mouth daily.     . mupirocin  ointment (BACTROBAN) 2 % APP EXT AA TID    . Respiratory Therapy Supplies (FLUTTER) DEVI Use as directed. 1 each 0  . sertraline (ZOLOFT) 50 MG tablet Take 50 mg by mouth daily.    . hydrocortisone cream 0.5 % Apply 1 application topically 2 (two) times daily as needed for itching. 30 g 0  . predniSONE (DELTASONE) 20 MG tablet Take 4 tablets (80 mg) daily for 1 week, take 3 tablets (60 mg) daily on week 2, take two tablets (40 mg) daily on week 3, and 1 tablet (20 mg) daily on  week 4 70 tablet 0   No current facility-administered medications for this visit.    SURGICAL HISTORY:  Past Surgical History:  Procedure Laterality Date  . ABDOMINAL HYSTERECTOMY    . BLADDER SURGERY    . BRAIN SURGERY    . ENDOBRONCHIAL ULTRASOUND Bilateral 02/06/2017   Procedure: ENDOBRONCHIAL ULTRASOUND;  Surgeon: Juanito Doom, MD;  Location: [facility];  Service: Cardiopulmonary;  Laterality: Bilateral;  . IR GUIDED DRAIN W CATHETER PLACEMENT  06/29/2017  . IR REMOVAL OF PLURAL CATH W/CUFF  08/28/2017  . IR US GUIDE BX ASP/DRAIN  06/29/2017  . VIDEO BRONCHOSCOPY WITH ENDOBRONCHIAL ULTRASOUND N/A 03/23/2017   Procedure: VIDEO BRONCHOSCOPY WITH ENDOBRONCHIAL ULTRASOUND;  Surgeon: Juanito Doom, MD;  Location: [facility];  Service: Thoracic;  Laterality: N/A;    REVIEW OF SYSTEMS:   Review of Systems  Constitutional: Negative for appetite change, chills, fatigue, fever and unexpected weight change.  HENT: Negative for mouth sores, nosebleeds, sore throat and trouble swallowing.   Eyes: Negative for eye problems and icterus.  Respiratory: Positive for baseline productive cough and wheezing once ever few weeks. Negative for hemoptysis or shortness of breath Cardiovascular: Negative for chest pain and leg swelling.  Gastrointestinal: Negative for abdominal pain, constipation, diarrhea, nausea and vomiting.  Genitourinary: Negative for bladder incontinence, difficulty urinating, dysuria, frequency and hematuria.   Musculoskeletal: Negative for back pain, gait problem, neck pain and neck stiffness.  Skin: Positive for rash on anterior chest with associated itching. Positive for dry scalp. Neurological: Negative for dizziness, extremity weakness, gait problem, headaches, light-headedness and seizures.  Hematological: Negative for adenopathy. Does not bruise/bleed easily.  Psychiatric/Behavioral: Negative for confusion, depression and sleep disturbance. The patient is not  nervous/anxious.     PHYSICAL EXAMINATION:  Blood pressure 104/68, pulse 89, temperature 97.8 F (36.6 C), temperature source Temporal, resp. rate 18, height 5\' 9"  (1.753 m), weight 173 lb 9.6 oz (78.7 kg), SpO2 92 %.  ECOG PERFORMANCE STATUS: 1 - Symptomatic but completely ambulatory  Physical Exam  Constitutional: Oriented to person, place, and time and well-developed, well-nourished, and in no distress.  HENT:  Head: Normocephalic and atraumatic.  Mouth/Throat: Oropharynx is clear and moist. No oropharyngeal exudate.  Eyes: Conjunctivae are normal. Right eye exhibits no discharge. Left eye exhibits no discharge. No scleral icterus.  Neck: Normal range of motion. Neck supple.  Cardiovascular: Normal rate, regular rhythm, normal heart sounds and intact distal pulses.   Pulmonary/Chest: Effort normal. Decreased breath sounds in right upper lobe.. No respiratory distress. No wheezes. No rales.  Abdominal: Soft. Bowel sounds are normal. Exhibits no distension and no mass. There is no tenderness.  Musculoskeletal: Normal range of motion. Exhibits no edema.  Lymphadenopathy:    No cervical adenopathy.  Neurological: Alert and oriented to person, place, and time. Exhibits normal muscle tone. Gait normal. Coordination normal.  Skin: Skin is warm and dry. Dry scaly rash noted  on left anterior chest/shoulder. No pallor.  Psychiatric: Mood, memory and judgment normal.  Vitals reviewed.  LABORATORY DATA: Lab Results  Component Value Date   WBC 6.5 03/04/2019   HGB 11.9 (L) 03/04/2019   HCT 39.0 03/04/2019   MCV 86.5 03/04/2019   PLT 257 03/04/2019      Chemistry      Component Value Date/Time   NA 143 03/04/2019 1402   NA 143 03/15/2017 1436   K 3.8 03/04/2019 1402   K 3.7 03/15/2017 1436   CL 106 03/04/2019 1402   CO2 27 03/04/2019 1402   CO2 34 (H) 03/15/2017 1436   BUN 18 03/04/2019 1402   BUN 17.8 03/15/2017 1436   CREATININE 0.82 03/04/2019 1402   CREATININE 0.8  03/15/2017 1436      Component Value Date/Time   CALCIUM 8.8 (L) 03/04/2019 1402   CALCIUM 9.6 03/15/2017 1436   ALKPHOS 135 (H) 03/04/2019 1402   ALKPHOS 88 03/15/2017 1436   AST 19 03/04/2019 1402   AST 16 03/15/2017 1436   ALT 14 03/04/2019 1402   ALT 15 03/15/2017 1436   BILITOT 0.5 03/04/2019 1402   BILITOT 0.52 03/15/2017 1436       RADIOGRAPHIC STUDIES:  CT Chest W Contrast  Result Date: 02/28/2019 CLINICAL DATA:  Restaging non-small cell lung cancer. EXAM: CT CHEST, ABDOMEN, AND PELVIS WITH CONTRAST TECHNIQUE: Multidetector CT imaging of the chest, abdomen and pelvis was performed following the standard protocol during bolus administration of intravenous contrast. CONTRAST:  121mL OMNIPAQUE IOHEXOL 300 MG/ML  SOLN COMPARISON:  CT scan 12/09/2018 FINDINGS: CT CHEST FINDINGS Cardiovascular: The heart is normal in size. Stable advanced atherosclerotic calcifications, tortuosity and ectasia of the thoracic aorta. Stable extensive three-vessel coronary artery calcifications. Mediastinum/Nodes: Stable ill-defined low-attenuation soft tissue density in the precarinal and subcarinal regions, likely treated tumor. No obvious new or progressive findings. Stable 7.5 mm left hilar lymph node. The esophagus is grossly normal. Several small retrocrural lymph nodes have enlarged slightly since the prior study. Lungs/Pleura: Stable extensive right upper lobe and right middle lobe scarring changes and atelectasis with further reduction in aeration. Associated bronchiectasis. Stable loculated pleural fluid collection in the right upper thorax. Stable right lower lobe pleural and parenchymal scarring changes. No pulmonary nodules are identified in the aerated right lung base. The left lung demonstrates persistent numerous pulmonary metastatic nodules. Most of these demonstrate slight interval enlargement when compared to the prior study. 8.5 mm subpleural left upper lobe pulmonary nodule on image 75/4  previously measured 6 mm. 9 mm left upper lobe nodule on image 82/for previously measured 7 mm. 10 mm left lower lobe nodule on image 99/for previously measured 8 mm. 8 mm left lower lobe nodule on image 100/4 previously measured 5 mm. Chest wall/musculoskeletal: No breast masses are identified. No axillary adenopathy. There is a left supraclavicular lymph node on image 11/2 which measures 18 mm and previously measured 11 mm. Stable right lobe thyroid lesion measuring 16 mm. No destructive bony changes or worrisome bone lesions. Stable T12 compression fracture. CT ABDOMEN PELVIS FINDINGS Hepatobiliary: No focal hepatic lesions to suggest metastatic disease. The gallbladder demonstrates numerous layering gallstones. No common bile duct dilatation. Pancreas: No mass, inflammation or ductal dilatation. Spleen: Normal size.  No focal lesions. Adrenals/Urinary Tract: Stable bilateral adrenal gland adenomas. No worrisome renal lesions or hydronephrosis. The bladder is unremarkable. Stomach/Bowel: The stomach, duodenum, small bowel and colon are unremarkable. No acute inflammatory changes,, mass lesions or obstructive findings. Vascular/Lymphatic: Stable advanced  atherosclerotic calcifications involving the aorta and iliac arteries and branch vessels. The major venous structures are patent. Small scattered mesenteric and retroperitoneal lymph nodes are stable. No pelvic mass or adenopathy. Reproductive: The uterus is surgically absent. Both ovaries are still present and appear normal. Other: No pelvic mass or adenopathy. No free pelvic fluid collections. No inguinal mass or adenopathy. No abdominal wall hernia or subcutaneous lesions. Musculoskeletal: No worrisome bone lesions. IMPRESSION: 1. Progressive scarring, atelectasis and consolidation in the right upper lobe and right middle lobe. Persistent loculated pleural fluid collections also. 2. Stable diffuse ill-defined low-attenuation soft tissue density in the  mediastinum and surrounding the bronchus intermedius. No new or progressive findings. 3. Interval enlargement of a dominant left supraclavicular lymph node. 4. Progressive pulmonary metastatic nodules. 5. No findings for abdominal/pelvic metastatic disease. 6. Stable incidental findings including severe atherosclerotic disease, bilateral adrenal gland adenomas, right thyroid nodule and cholelithiasis. Electronically Signed   By: Marijo Sanes M.D.   On: 02/28/2019 08:44   CT Abdomen Pelvis W Contrast  Result Date: 02/28/2019 CLINICAL DATA:  Restaging non-small cell lung cancer. EXAM: CT CHEST, ABDOMEN, AND PELVIS WITH CONTRAST TECHNIQUE: Multidetector CT imaging of the chest, abdomen and pelvis was performed following the standard protocol during bolus administration of intravenous contrast. CONTRAST:  142mL OMNIPAQUE IOHEXOL 300 MG/ML  SOLN COMPARISON:  CT scan 12/09/2018 FINDINGS: CT CHEST FINDINGS Cardiovascular: The heart is normal in size. Stable advanced atherosclerotic calcifications, tortuosity and ectasia of the thoracic aorta. Stable extensive three-vessel coronary artery calcifications. Mediastinum/Nodes: Stable ill-defined low-attenuation soft tissue density in the precarinal and subcarinal regions, likely treated tumor. No obvious new or progressive findings. Stable 7.5 mm left hilar lymph node. The esophagus is grossly normal. Several small retrocrural lymph nodes have enlarged slightly since the prior study. Lungs/Pleura: Stable extensive right upper lobe and right middle lobe scarring changes and atelectasis with further reduction in aeration. Associated bronchiectasis. Stable loculated pleural fluid collection in the right upper thorax. Stable right lower lobe pleural and parenchymal scarring changes. No pulmonary nodules are identified in the aerated right lung base. The left lung demonstrates persistent numerous pulmonary metastatic nodules. Most of these demonstrate slight interval  enlargement when compared to the prior study. 8.5 mm subpleural left upper lobe pulmonary nodule on image 75/4 previously measured 6 mm. 9 mm left upper lobe nodule on image 82/for previously measured 7 mm. 10 mm left lower lobe nodule on image 99/for previously measured 8 mm. 8 mm left lower lobe nodule on image 100/4 previously measured 5 mm. Chest wall/musculoskeletal: No breast masses are identified. No axillary adenopathy. There is a left supraclavicular lymph node on image 11/2 which measures 18 mm and previously measured 11 mm. Stable right lobe thyroid lesion measuring 16 mm. No destructive bony changes or worrisome bone lesions. Stable T12 compression fracture. CT ABDOMEN PELVIS FINDINGS Hepatobiliary: No focal hepatic lesions to suggest metastatic disease. The gallbladder demonstrates numerous layering gallstones. No common bile duct dilatation. Pancreas: No mass, inflammation or ductal dilatation. Spleen: Normal size.  No focal lesions. Adrenals/Urinary Tract: Stable bilateral adrenal gland adenomas. No worrisome renal lesions or hydronephrosis. The bladder is unremarkable. Stomach/Bowel: The stomach, duodenum, small bowel and colon are unremarkable. No acute inflammatory changes,, mass lesions or obstructive findings. Vascular/Lymphatic: Stable advanced atherosclerotic calcifications involving the aorta and iliac arteries and branch vessels. The major venous structures are patent. Small scattered mesenteric and retroperitoneal lymph nodes are stable. No pelvic mass or adenopathy. Reproductive: The uterus is surgically absent. Both ovaries  are still present and appear normal. Other: No pelvic mass or adenopathy. No free pelvic fluid collections. No inguinal mass or adenopathy. No abdominal wall hernia or subcutaneous lesions. Musculoskeletal: No worrisome bone lesions. IMPRESSION: 1. Progressive scarring, atelectasis and consolidation in the right upper lobe and right middle lobe. Persistent loculated  pleural fluid collections also. 2. Stable diffuse ill-defined low-attenuation soft tissue density in the mediastinum and surrounding the bronchus intermedius. No new or progressive findings. 3. Interval enlargement of a dominant left supraclavicular lymph node. 4. Progressive pulmonary metastatic nodules. 5. No findings for abdominal/pelvic metastatic disease. 6. Stable incidental findings including severe atherosclerotic disease, bilateral adrenal gland adenomas, right thyroid nodule and cholelithiasis. Electronically Signed   By: Marijo Sanes M.D.   On: 02/28/2019 08:44     ASSESSMENT/PLAN:  This is a very pleasant 78 year old Caucasian female diagnosed with probable stage IV non-small cell lung cancer. She presented with a large right suprahilar lung mass in addition to mediastinal lymphadenopathy and a suspicious malignant right pleural effusion.   The patient underwent systemic chemotherapy with carboplatin, paclitaxel and Keytruda. She is status post 3 cycles. During the last cycle of treatment, she developed C. difficile, dehydration, and renal insufficiency.  He is currently undergoing immunotherapy with Keytruda 200 mg IV every 3 weeks. She is status post 31 cycles. She is tolerating treatment fairly well except for a persistent rash.   The patient recently had a restaging CT scan performed.  Dr. Julien Nordmann personally and independently reviewed the scan and discussed the results with the patient today.  The scan showed progressive interval increase in the metastatic pulmonary nodules.   Dr. Julien Nordmann had a lengthy discussion today with the patient about her current condition and treatment options.  Dr. Julien Nordmann recommends holding the patient's treatment until improvement of her rash.  I will send a prescription to the patient's pharmacy for a high-dose steroid taper.   The patient was instructed to take 80 mg p.o. daily for 1 week followed by 60 mg p.o. daily for 1 week followed by 40 mg  daily for 1 week followed by 20 mg daily for 1 week.  I have also sent a refill to her pharmacy of the patient's hydrocortisone cream as well as her Atarax for her itching.  We will see the patient back for a follow-up visit in 1 month for evaluation and to discuss her treatment options at that time such as possibly switching treatment to gemcitabine. The patient had a challenging time in the past with chemotherapy and may not be a candidate for aggressive chemotherapy.   The patient was advised to call immediately if she has any concerning symptoms in the interval. The patient voices understanding of current disease status and treatment options and is in agreement with the current care plan. All questions were answered. The patient knows to call the clinic with any problems, questions or concerns. We can certainly see the patient much sooner if necessary   Orders Placed This Encounter  Procedures  . CBC with Differential (Cancer Center Only)    Standing Status:   Future    Standing Expiration Date:   03/03/2020  . CMP ([facility] only)    Standing Status:   Future    Standing Expiration Date:   03/03/2020  . TSH    Standing Status:   Future    Standing Expiration Date:   03/03/2020     Tobe Sos Johnn Krasowski, PA-C 03/04/19  ADDENDUM: Hematology/Oncology Attending: I had a face-to-face encounter  with the patient today.  I recommended her care plan.  This is a very pleasant 78 years old white female with metastatic non-small cell lung cancer, squamous cell carcinoma initially started on systemic chemotherapy with carboplatin, paclitaxel and Keytruda but she has a rough time with the chemotherapy and this was discontinued.  The patient continued her maintenance treatment with single agent Keytruda status post 31 cycles.  She has been tolerating this treatment well except for significant skin rash on the left anterior chest and arms as well as the back of the neck.  She has been  applying hydrocortisone cream as well as Atarax orally with minimal improvement of her condition. She had repeat CT scan of the chest, abdomen and pelvis performed recently.  I personally and independently reviewed the scans and discussed the results with the patient today. Her scan showed mild but persistent disease progression with multiple pulmonary nodules as well as increase in left supraclavicular lymphadenopathy. I recommended for the patient to hold her treatment with [facility] for now because of the significant skin rash. We will start her on a tapered dose of prednisone over the next 4-5 weeks. We will see the patient back for follow-up visit in 4 weeks for evaluation and discussion of her treatment options including resuming treatment with Keytruda and acceptance of the mild disease progression versus consideration of systemic chemotherapy with single agent gemcitabine. The patient agreed to the current plan. For the itching she will continue on Atarax for now. She was advised to call immediately if she has any other concerning symptoms in the interval.  Disclaimer: This note was dictated with voice recognition software. Similar sounding words can inadvertently be transcribed and may be missed upon review. [name], MD 03/04/19

## 2019-03-04 ENCOUNTER — Inpatient Hospital Stay: Payer: Medicare Other

## 2019-03-04 ENCOUNTER — Other Ambulatory Visit: Payer: Self-pay

## 2019-03-04 ENCOUNTER — Encounter: Payer: Self-pay | Admitting: Physician Assistant

## 2019-03-04 ENCOUNTER — Inpatient Hospital Stay: Payer: Medicare Other | Attending: Internal Medicine | Admitting: Physician Assistant

## 2019-03-04 VITALS — BP 104/68 | HR 89 | Temp 97.8°F | Resp 18 | Ht 69.0 in | Wt 173.6 lb

## 2019-03-04 DIAGNOSIS — C3491 Malignant neoplasm of unspecified part of right bronchus or lung: Secondary | ICD-10-CM | POA: Diagnosis present

## 2019-03-04 DIAGNOSIS — R5383 Other fatigue: Secondary | ICD-10-CM

## 2019-03-04 DIAGNOSIS — J910 Malignant pleural effusion: Secondary | ICD-10-CM | POA: Diagnosis not present

## 2019-03-04 DIAGNOSIS — L299 Pruritus, unspecified: Secondary | ICD-10-CM | POA: Diagnosis not present

## 2019-03-04 DIAGNOSIS — C3490 Malignant neoplasm of unspecified part of unspecified bronchus or lung: Secondary | ICD-10-CM

## 2019-03-04 DIAGNOSIS — R21 Rash and other nonspecific skin eruption: Secondary | ICD-10-CM | POA: Diagnosis not present

## 2019-03-04 DIAGNOSIS — Z79899 Other long term (current) drug therapy: Secondary | ICD-10-CM | POA: Diagnosis not present

## 2019-03-04 DIAGNOSIS — Z7189 Other specified counseling: Secondary | ICD-10-CM

## 2019-03-04 LAB — CBC WITH DIFFERENTIAL (CANCER CENTER ONLY)
Abs Immature Granulocytes: 0.01 10*3/uL (ref 0.00–0.07)
Basophils Absolute: 0 10*3/uL (ref 0.0–0.1)
Basophils Relative: 1 %
Eosinophils Absolute: 0.1 10*3/uL (ref 0.0–0.5)
Eosinophils Relative: 1 %
HCT: 39 % (ref 36.0–46.0)
Hemoglobin: 11.9 g/dL — ABNORMAL LOW (ref 12.0–15.0)
Immature Granulocytes: 0 %
Lymphocytes Relative: 14 %
Lymphs Abs: 0.9 10*3/uL (ref 0.7–4.0)
MCH: 26.4 pg (ref 26.0–34.0)
MCHC: 30.5 g/dL (ref 30.0–36.0)
MCV: 86.5 fL (ref 80.0–100.0)
Monocytes Absolute: 0.5 10*3/uL (ref 0.1–1.0)
Monocytes Relative: 8 %
Neutro Abs: 5 10*3/uL (ref 1.7–7.7)
Neutrophils Relative %: 76 %
Platelet Count: 257 10*3/uL (ref 150–400)
RBC: 4.51 MIL/uL (ref 3.87–5.11)
RDW: 14.5 % (ref 11.5–15.5)
WBC Count: 6.5 10*3/uL (ref 4.0–10.5)
nRBC: 0 % (ref 0.0–0.2)

## 2019-03-04 LAB — CMP (CANCER CENTER ONLY)
ALT: 14 U/L (ref 0–44)
AST: 19 U/L (ref 15–41)
Albumin: 2.9 g/dL — ABNORMAL LOW (ref 3.5–5.0)
Alkaline Phosphatase: 135 U/L — ABNORMAL HIGH (ref 38–126)
Anion gap: 10 (ref 5–15)
BUN: 18 mg/dL (ref 8–23)
CO2: 27 mmol/L (ref 22–32)
Calcium: 8.8 mg/dL — ABNORMAL LOW (ref 8.9–10.3)
Chloride: 106 mmol/L (ref 98–111)
Creatinine: 0.82 mg/dL (ref 0.44–1.00)
GFR, Est AFR Am: >60 mL/min (ref >60)
GFR, Estimated: >60 mL/min (ref >60)
Glucose, Bld: 119 mg/dL — ABNORMAL HIGH (ref 70–99)
Potassium: 3.8 mmol/L (ref 3.5–5.1)
Sodium: 143 mmol/L (ref 135–145)
Total Bilirubin: 0.5 mg/dL (ref 0.3–1.2)
Total Protein: 7.5 g/dL (ref 6.5–8.1)

## 2019-03-04 LAB — TSH: TSH: 6.315 u[IU]/mL — ABNORMAL HIGH (ref 0.308–3.960)

## 2019-03-04 MED ORDER — PREDNISONE 20 MG PO TABS: ORAL_TABLET | ORAL | 0 refills | Status: DC

## 2019-03-04 MED ORDER — HYDROCORTISONE 0.5 % EX CREA: 1.0000 "application " | TOPICAL_CREAM | Freq: Two times a day (BID) | CUTANEOUS | 0 refills | Status: DC | PRN

## 2019-03-04 MED ORDER — HYDROXYZINE HCL 10 MG PO TABS: 10.0000 mg | ORAL_TABLET | Freq: Three times a day (TID) | ORAL | 1 refills | Status: DC | PRN

## 2019-03-06 ENCOUNTER — Other Ambulatory Visit: Payer: Self-pay | Admitting: Physician Assistant

## 2019-03-06 DIAGNOSIS — L299 Pruritus, unspecified: Secondary | ICD-10-CM

## 2019-03-06 DIAGNOSIS — R21 Rash and other nonspecific skin eruption: Secondary | ICD-10-CM

## 2019-03-06 MED ORDER — HYDROXYZINE HCL 10 MG PO TABS: 10.0000 mg | ORAL_TABLET | Freq: Three times a day (TID) | ORAL | 1 refills | Status: DC | PRN

## 2019-03-10 ENCOUNTER — Telehealth: Payer: Self-pay | Admitting: Emergency Medicine

## 2019-03-10 NOTE — Telephone Encounter (Signed)
Received call from pt's daughter [name] who states that pt is being offered Covid-19 vaccine at her facility and wanted to know what Dr. Worthy Flank policy was on her receiving it.  Spoke with RN Diane, daughter advised to continue forward with vaccine as long as patient has had the flu vaccine in the last few years and has tolerated it well.  Verbalized understanding, denies any further questions or concerns at this time.

## 2019-03-11 DIAGNOSIS — Z23 Encounter for immunization: Secondary | ICD-10-CM | POA: Diagnosis not present

## 2019-03-23 NOTE — Progress Notes (Deleted)
[facility] OFFICE PROGRESS NOTE  [name], MD [address] [place] [address]  DIAGNOSIS: Metastatic non-small cell carcinoma (T3, N2, M1a) non-small cell lung cancer diagnosed in November 2018 and presented with large right suprahilar and mediastinal mass as well as recent right pleural effusion.  Guardant 360:No actionable mutations.  PRIOR THERAPY:  1) Palliative radiotherapy to the large mediastinal mass under the care of Dr. Lisbeth Renshaw. 2) Systemic chemotherapy with carboplatin for AUC of 5, paclitaxel 175 mg/M2 and Keytruda 200 mg IV every 3 weeks. First dose expected May 01, 2017. Status post 3 cycles. 3) Maintenance treatment with immunotherapy with single agent Keytruda 200 mg IV every 3 weeks. First cycle 07/24/2017. Status post 30cycles. Discontinued due to evidence of disease progression  CURRENT THERAPY:  INTERVAL HISTORY: Cassidy Sanders 79 y.o. female returns To clinic for follow-up visit.  At the patient's last appointment she had a restaging CT scan performed which showed evidence of disease progression with an increase in metastatic pulmonary nodules.  She also had persistent and a continuous rash.  She was placed on a high-dose prednisone taper.  She is currently on Blanca milligrams p.o. daily.  Since her last appointment she is feeling __and her rash has__at this time.  She is also taking Atarax for itching and using topical hydrocortisone cream.  Today the patient denies any recent fever, chills, night sweats, or weight loss.  She denies any chest pain, shortness of breath, cough, or hemoptysis.  She denies any nausea, vomiting, diarrhea, or constipation.  She denies any headache or visual changes.  She is here today for evaluation to further discuss her treatment options.     MEDICAL HISTORY: Past Medical History:  Diagnosis Date  . Arrhythmia   . Atrial fibrillation ([place])   . Brain tumor (benign) ([place])   . Depression   . Dyspnea     home oxygen  . Dysrhythmia    a fib  . History of home oxygen therapy 02/2017   2 L/minute  . Hypertension     ALLERGIES:  is allergic to latex; gabapentin; and tape.  MEDICATIONS:  Current Outpatient Medications  Medication Sig Dispense Refill  . albuterol (PROVENTIL) (2.5 MG/3ML) 0.083% nebulizer solution VVN Q 4 H PRF WHEEZING OR SHORTNESS OF BREATH 360 mL 2  . Biotin 2.5 MG TABS Take 2,500 mcg by mouth daily.    . clobetasol cream (TEMOVATE) 7.65 % Apply 1 application topically 2 (two) times daily. 60 g 0  . diphenhydrAMINE (BENADRYL) 25 mg capsule Take 1 capsule (25 mg total) by mouth at bedtime as needed for sleep. 30 capsule 0  . hydrocortisone cream 0.5 % Apply 1 application topically 2 (two) times daily as needed for itching. 30 g 0  . hydrOXYzine (ATARAX/VISTARIL) 10 MG tablet Take 1 tablet (10 mg total) by mouth 3 (three) times daily as needed for itching. 90 tablet 1  . ketoconazole (NIZORAL) 2 % shampoo APP EXT 3 TIMES A WK    . levalbuterol (XOPENEX) 0.63 MG/3ML nebulizer solution Take 3 mLs (0.63 mg total) by nebulization every 6 (six) hours as needed for wheezing or shortness of breath. 3 mL 12  . levothyroxine (SYNTHROID) 75 MCG tablet TAKE 1 TABLET(75 MCG) BY MOUTH DAILY BEFORE BREAKFAST 30 tablet 2  . metoprolol tartrate (LOPRESSOR) 25 MG tablet Take 25 mg by mouth daily.     . mupirocin ointment (BACTROBAN) 2 % APP EXT AA TID    . predniSONE (DELTASONE) 20 MG tablet  Take 4 tablets (80 mg) daily for 1 week, take 3 tablets (60 mg) daily on week 2, take two tablets (40 mg) daily on week 3, and 1 tablet (20 mg) daily on week 4 70 tablet 0  . Respiratory Therapy Supplies (FLUTTER) DEVI Use as directed. 1 each 0  . sertraline (ZOLOFT) 50 MG tablet Take 50 mg by mouth daily.     No current facility-administered medications for this visit.    SURGICAL HISTORY:  Past Surgical History:  Procedure Laterality Date  . ABDOMINAL HYSTERECTOMY    . BLADDER SURGERY    .  BRAIN SURGERY    . ENDOBRONCHIAL ULTRASOUND Bilateral 02/06/2017   Procedure: ENDOBRONCHIAL ULTRASOUND;  Surgeon: Juanito Doom, MD;  Location: [facility] END[facility]COPY;  Service: Cardiopulmonary;  Laterality: Bilateral;  . IR GUIDED DRAIN W CATHETER PLACEMENT  06/29/2017  . IR REMOVAL OF PLURAL CATH W/CUFF  08/28/2017  . IR US GUIDE BX ASP/DRAIN  06/29/2017  . VIDEO BRONCHOSCOPY WITH ENDOBRONCHIAL ULTRASOUND N/A 03/23/2017   Procedure: VIDEO BRONCHOSCOPY WITH ENDOBRONCHIAL ULTRASOUND;  Surgeon: Juanito Doom, MD;  Location: [facility] OR;  Service: Thoracic;  Laterality: N/A;    REVIEW OF SYSTEMS:   Review of Systems  Constitutional: Negative for appetite change, chills, fatigue, fever and unexpected weight change.  HENT:   Negative for mouth sores, nosebleeds, sore throat and trouble swallowing.   Eyes: Negative for eye problems and icterus.  Respiratory: Negative for cough, hemoptysis, shortness of breath and wheezing.   Cardiovascular: Negative for chest pain and leg swelling.  Gastrointestinal: Negative for abdominal pain, constipation, diarrhea, nausea and vomiting.  Genitourinary: Negative for bladder incontinence, difficulty urinating, dysuria, frequency and hematuria.   Musculoskeletal: Negative for back pain, gait problem, neck pain and neck stiffness.  Skin: Negative for itching and rash.  Neurological: Negative for dizziness, extremity weakness, gait problem, headaches, light-headedness and seizures.  Hematological: Negative for adenopathy. Does not bruise/bleed easily.  Psychiatric/Behavioral: Negative for confusion, depression and sleep disturbance. The patient is not nervous/anxious.     PHYSICAL EXAMINATION:  There were no vitals taken for this visit.  ECOG PERFORMANCE STATUS: {CHL ONC ECOG [id number]  Physical Exam  Constitutional: Oriented to person, place, and time and well-developed, well-nourished, and in no distress. No distress.  HENT:  Head: Normocephalic and  atraumatic.  Mouth/Throat: Oropharynx is clear and moist. No oropharyngeal exudate.  Eyes: Conjunctivae are normal. Right eye exhibits no discharge. Left eye exhibits no discharge. No scleral icterus.  Neck: Normal range of motion. Neck supple.  Cardiovascular: Normal rate, regular rhythm, normal heart sounds and intact distal pulses.   Pulmonary/Chest: Effort normal and breath sounds normal. No respiratory distress. No wheezes. No rales.  Abdominal: Soft. Bowel sounds are normal. Exhibits no distension and no mass. There is no tenderness.  Musculoskeletal: Normal range of motion. Exhibits no edema.  Lymphadenopathy:    No cervical adenopathy.  Neurological: Alert and oriented to person, place, and time. Exhibits normal muscle tone. Gait normal. Coordination normal.  Skin: Skin is warm and dry. No rash noted. Not diaphoretic. No erythema. No pallor.  Psychiatric: Mood, memory and judgment normal.  Vitals reviewed.  LABORATORY DATA: Lab Results  Component Value Date   WBC 6.5 03/04/2019   HGB 11.9 (L) 03/04/2019   HCT 39.0 03/04/2019   MCV 86.5 03/04/2019   PLT 257 03/04/2019      Chemistry      Component Value Date/Time   NA 143 03/04/2019 1402   NA 143 03/15/2017 1436  K 3.8 03/04/2019 1402   K 3.7 03/15/2017 1436   CL 106 03/04/2019 1402   CO2 27 03/04/2019 1402   CO2 34 (H) 03/15/2017 1436   BUN 18 03/04/2019 1402   BUN 17.8 03/15/2017 1436   CREATININE 0.82 03/04/2019 1402   CREATININE 0.8 03/15/2017 1436      Component Value Date/Time   CALCIUM 8.8 (L) 03/04/2019 1402   CALCIUM 9.6 03/15/2017 1436   ALKPHOS 135 (H) 03/04/2019 1402   ALKPHOS 88 03/15/2017 1436   AST 19 03/04/2019 1402   AST 16 03/15/2017 1436   ALT 14 03/04/2019 1402   ALT 15 03/15/2017 1436   BILITOT 0.5 03/04/2019 1402   BILITOT 0.52 03/15/2017 1436       RADIOGRAPHIC STUDIES:  CT Chest W Contrast  Result Date: 02/28/2019 CLINICAL DATA:  Restaging non-small cell lung cancer. EXAM: CT  CHEST, ABDOMEN, AND PELVIS WITH CONTRAST TECHNIQUE: Multidetector CT imaging of the chest, abdomen and pelvis was performed following the standard protocol during bolus administration of intravenous contrast. CONTRAST:  1105mL OMNIPAQUE IOHEXOL 300 MG/ML  SOLN COMPARISON:  CT scan 12/09/2018 FINDINGS: CT CHEST FINDINGS Cardiovascular: The heart is normal in size. Stable advanced atherosclerotic calcifications, tortuosity and ectasia of the thoracic aorta. Stable extensive three-vessel coronary artery calcifications. Mediastinum/Nodes: Stable ill-defined low-attenuation soft tissue density in the precarinal and subcarinal regions, likely treated tumor. No obvious new or progressive findings. Stable 7.5 mm left hilar lymph node. The esophagus is grossly normal. Several small retrocrural lymph nodes have enlarged slightly since the prior study. Lungs/Pleura: Stable extensive right upper lobe and right middle lobe scarring changes and atelectasis with further reduction in aeration. Associated bronchiectasis. Stable loculated pleural fluid collection in the right upper thorax. Stable right lower lobe pleural and parenchymal scarring changes. No pulmonary nodules are identified in the aerated right lung base. The left lung demonstrates persistent numerous pulmonary metastatic nodules. Most of these demonstrate slight interval enlargement when compared to the prior study. 8.5 mm subpleural left upper lobe pulmonary nodule on image 75/4 previously measured 6 mm. 9 mm left upper lobe nodule on image 82/for previously measured 7 mm. 10 mm left lower lobe nodule on image 99/for previously measured 8 mm. 8 mm left lower lobe nodule on image 100/4 previously measured 5 mm. Chest wall/musculoskeletal: No breast masses are identified. No axillary adenopathy. There is a left supraclavicular lymph node on image 11/2 which measures 18 mm and previously measured 11 mm. Stable right lobe thyroid lesion measuring 16 mm. No destructive  bony changes or worrisome bone lesions. Stable T12 compression fracture. CT ABDOMEN PELVIS FINDINGS Hepatobiliary: No focal hepatic lesions to suggest metastatic disease. The gallbladder demonstrates numerous layering gallstones. No common bile duct dilatation. Pancreas: No mass, inflammation or ductal dilatation. Spleen: Normal size.  No focal lesions. Adrenals/Urinary Tract: Stable bilateral adrenal gland adenomas. No worrisome renal lesions or hydronephrosis. The bladder is unremarkable. Stomach/Bowel: The stomach, duodenum, small bowel and colon are unremarkable. No acute inflammatory changes,, mass lesions or obstructive findings. Vascular/Lymphatic: Stable advanced atherosclerotic calcifications involving the aorta and iliac arteries and branch vessels. The major venous structures are patent. Small scattered mesenteric and retroperitoneal lymph nodes are stable. No pelvic mass or adenopathy. Reproductive: The uterus is surgically absent. Both ovaries are still present and appear normal. Other: No pelvic mass or adenopathy. No free pelvic fluid collections. No inguinal mass or adenopathy. No abdominal wall hernia or subcutaneous lesions. Musculoskeletal: No worrisome bone lesions. IMPRESSION: 1. Progressive scarring, atelectasis and  consolidation in the right upper lobe and right middle lobe. Persistent loculated pleural fluid collections also. 2. Stable diffuse ill-defined low-attenuation soft tissue density in the mediastinum and surrounding the bronchus intermedius. No new or progressive findings. 3. Interval enlargement of a dominant left supraclavicular lymph node. 4. Progressive pulmonary metastatic nodules. 5. No findings for abdominal/pelvic metastatic disease. 6. Stable incidental findings including severe atherosclerotic disease, bilateral adrenal gland adenomas, right thyroid nodule and cholelithiasis. Electronically Signed   By: Marijo Sanes M.D.   On: 02/28/2019 08:44   CT Abdomen Pelvis W  Contrast  Result Date: 02/28/2019 CLINICAL DATA:  Restaging non-small cell lung cancer. EXAM: CT CHEST, ABDOMEN, AND PELVIS WITH CONTRAST TECHNIQUE: Multidetector CT imaging of the chest, abdomen and pelvis was performed following the standard protocol during bolus administration of intravenous contrast. CONTRAST:  115mL OMNIPAQUE IOHEXOL 300 MG/ML  SOLN COMPARISON:  CT scan 12/09/2018 FINDINGS: CT CHEST FINDINGS Cardiovascular: The heart is normal in size. Stable advanced atherosclerotic calcifications, tortuosity and ectasia of the thoracic aorta. Stable extensive three-vessel coronary artery calcifications. Mediastinum/Nodes: Stable ill-defined low-attenuation soft tissue density in the precarinal and subcarinal regions, likely treated tumor. No obvious new or progressive findings. Stable 7.5 mm left hilar lymph node. The esophagus is grossly normal. Several small retrocrural lymph nodes have enlarged slightly since the prior study. Lungs/Pleura: Stable extensive right upper lobe and right middle lobe scarring changes and atelectasis with further reduction in aeration. Associated bronchiectasis. Stable loculated pleural fluid collection in the right upper thorax. Stable right lower lobe pleural and parenchymal scarring changes. No pulmonary nodules are identified in the aerated right lung base. The left lung demonstrates persistent numerous pulmonary metastatic nodules. Most of these demonstrate slight interval enlargement when compared to the prior study. 8.5 mm subpleural left upper lobe pulmonary nodule on image 75/4 previously measured 6 mm. 9 mm left upper lobe nodule on image 82/for previously measured 7 mm. 10 mm left lower lobe nodule on image 99/for previously measured 8 mm. 8 mm left lower lobe nodule on image 100/4 previously measured 5 mm. Chest wall/musculoskeletal: No breast masses are identified. No axillary adenopathy. There is a left supraclavicular lymph node on image 11/2 which measures 18  mm and previously measured 11 mm. Stable right lobe thyroid lesion measuring 16 mm. No destructive bony changes or worrisome bone lesions. Stable T12 compression fracture. CT ABDOMEN PELVIS FINDINGS Hepatobiliary: No focal hepatic lesions to suggest metastatic disease. The gallbladder demonstrates numerous layering gallstones. No common bile duct dilatation. Pancreas: No mass, inflammation or ductal dilatation. Spleen: Normal size.  No focal lesions. Adrenals/Urinary Tract: Stable bilateral adrenal gland adenomas. No worrisome renal lesions or hydronephrosis. The bladder is unremarkable. Stomach/Bowel: The stomach, duodenum, small bowel and colon are unremarkable. No acute inflammatory changes,, mass lesions or obstructive findings. Vascular/Lymphatic: Stable advanced atherosclerotic calcifications involving the aorta and iliac arteries and branch vessels. The major venous structures are patent. Small scattered mesenteric and retroperitoneal lymph nodes are stable. No pelvic mass or adenopathy. Reproductive: The uterus is surgically absent. Both ovaries are still present and appear normal. Other: No pelvic mass or adenopathy. No free pelvic fluid collections. No inguinal mass or adenopathy. No abdominal wall hernia or subcutaneous lesions. Musculoskeletal: No worrisome bone lesions. IMPRESSION: 1. Progressive scarring, atelectasis and consolidation in the right upper lobe and right middle lobe. Persistent loculated pleural fluid collections also. 2. Stable diffuse ill-defined low-attenuation soft tissue density in the mediastinum and surrounding the bronchus intermedius. No new or progressive findings. 3. Interval  enlargement of a dominant left supraclavicular lymph node. 4. Progressive pulmonary metastatic nodules. 5. No findings for abdominal/pelvic metastatic disease. 6. Stable incidental findings including severe atherosclerotic disease, bilateral adrenal gland adenomas, right thyroid nodule and cholelithiasis.  Electronically Signed   By: Marijo Sanes M.D.   On: 02/28/2019 08:44     ASSESSMENT/PLAN:  This is a very pleasant 79 year old Caucasian female diagnosed with probable stage IV non-small cell lung cancer. She presented with a large right suprahilar lung mass in addition to mediastinal lymphadenopathy and asuspicious malignantright pleural effusion.   The patient underwent systemic chemotherapy with carboplatin, paclitaxel and Keytruda. She is status post 3 cycles. During the last cycle of treatment, she developed C. difficile, dehydration, and renal insufficiency.  He is currently undergoing immunotherapy with Keytruda 200 mg IV every 3 weeks. She is status post31cycles. She is tolerating treatment fairly well except for a persistent rash.  Her most recent CT scan showed evidence of disease progression.  She is currently taking a high-dose prednisone taper for her rash.  The patient was seen with Dr. Earlie Server today.  Dr. Earlie Server had a lengthy discussion today about her current condition and treatment options.  Dr. Earlie Server recommended that the patient start systemic chemotherapy with gemcitabine 1000 mg/m.  Dr. Earlie Server does not believe that the patient will be a good candidate for docetaxel and Cyramza.  The patient is interested in this option she is expected to start her first dose on __.  FU  RX  The patient was advised to call immediately if she has any concerning symptoms in the interval. The patient voices understanding of current disease status and treatment options and is in agreement with the current care plan. All questions were answered. The patient knows to call the clinic with any problems, questions or concerns. We can certainly see the patient much sooner if necessary .     No orders of the defined types were placed in this encounter.    Cherene Dobbins L Capers Hagmann, PA-C 03/23/19

## 2019-03-24 ENCOUNTER — Inpatient Hospital Stay: Payer: Medicare Other

## 2019-03-24 ENCOUNTER — Inpatient Hospital Stay: Payer: Medicare Other | Admitting: Physician Assistant

## 2019-03-25 ENCOUNTER — Ambulatory Visit: Payer: Medicare Other | Admitting: Critical Care Medicine

## 2019-04-01 ENCOUNTER — Encounter: Payer: Self-pay | Admitting: Internal Medicine

## 2019-04-01 ENCOUNTER — Inpatient Hospital Stay (HOSPITAL_BASED_OUTPATIENT_CLINIC_OR_DEPARTMENT_OTHER): Payer: Medicare Other | Admitting: Internal Medicine

## 2019-04-01 ENCOUNTER — Other Ambulatory Visit: Payer: Self-pay

## 2019-04-01 ENCOUNTER — Inpatient Hospital Stay: Payer: Medicare Other | Attending: Internal Medicine

## 2019-04-01 VITALS — BP 119/77 | HR 102 | Temp 98.3°F | Resp 20 | Ht 69.0 in | Wt 176.1 lb

## 2019-04-01 DIAGNOSIS — R21 Rash and other nonspecific skin eruption: Secondary | ICD-10-CM | POA: Insufficient documentation

## 2019-04-01 DIAGNOSIS — R5383 Other fatigue: Secondary | ICD-10-CM | POA: Diagnosis not present

## 2019-04-01 DIAGNOSIS — L299 Pruritus, unspecified: Secondary | ICD-10-CM | POA: Insufficient documentation

## 2019-04-01 DIAGNOSIS — Z5111 Encounter for antineoplastic chemotherapy: Secondary | ICD-10-CM | POA: Insufficient documentation

## 2019-04-01 DIAGNOSIS — I10 Essential (primary) hypertension: Secondary | ICD-10-CM | POA: Diagnosis not present

## 2019-04-01 DIAGNOSIS — E039 Hypothyroidism, unspecified: Secondary | ICD-10-CM | POA: Diagnosis not present

## 2019-04-01 DIAGNOSIS — C3491 Malignant neoplasm of unspecified part of right bronchus or lung: Secondary | ICD-10-CM | POA: Diagnosis not present

## 2019-04-01 DIAGNOSIS — Z79899 Other long term (current) drug therapy: Secondary | ICD-10-CM | POA: Insufficient documentation

## 2019-04-01 DIAGNOSIS — C382 Malignant neoplasm of posterior mediastinum: Secondary | ICD-10-CM | POA: Diagnosis not present

## 2019-04-01 DIAGNOSIS — Z7189 Other specified counseling: Secondary | ICD-10-CM

## 2019-04-01 DIAGNOSIS — R0609 Other forms of dyspnea: Secondary | ICD-10-CM | POA: Insufficient documentation

## 2019-04-01 LAB — CMP (CANCER CENTER ONLY)
ALT: 20 U/L (ref 0–44)
AST: 18 U/L (ref 15–41)
Albumin: 3.3 g/dL — ABNORMAL LOW (ref 3.5–5.0)
Alkaline Phosphatase: 93 U/L (ref 38–126)
Anion gap: 9 (ref 5–15)
BUN: 21 mg/dL (ref 8–23)
CO2: 28 mmol/L (ref 22–32)
Calcium: 8.5 mg/dL — ABNORMAL LOW (ref 8.9–10.3)
Chloride: 106 mmol/L (ref 98–111)
Creatinine: 0.83 mg/dL (ref 0.44–1.00)
GFR, Est AFR Am: >60 mL/min (ref >60)
GFR, Estimated: >60 mL/min (ref >60)
Glucose, Bld: 91 mg/dL (ref 70–99)
Potassium: 3.3 mmol/L — ABNORMAL LOW (ref 3.5–5.1)
Sodium: 143 mmol/L (ref 135–145)
Total Bilirubin: 0.5 mg/dL (ref 0.3–1.2)
Total Protein: 6.9 g/dL (ref 6.5–8.1)

## 2019-04-01 LAB — CBC WITH DIFFERENTIAL (CANCER CENTER ONLY)
Abs Immature Granulocytes: 0.07 10*3/uL (ref 0.00–0.07)
Basophils Absolute: 0 10*3/uL (ref 0.0–0.1)
Basophils Relative: 0 %
Eosinophils Absolute: 0.1 10*3/uL (ref 0.0–0.5)
Eosinophils Relative: 1 %
HCT: 42.5 % (ref 36.0–46.0)
Hemoglobin: 13.1 g/dL (ref 12.0–15.0)
Immature Granulocytes: 1 %
Lymphocytes Relative: 10 %
Lymphs Abs: 1 10*3/uL (ref 0.7–4.0)
MCH: 26.4 pg (ref 26.0–34.0)
MCHC: 30.8 g/dL (ref 30.0–36.0)
MCV: 85.5 fL (ref 80.0–100.0)
Monocytes Absolute: 0.6 10*3/uL (ref 0.1–1.0)
Monocytes Relative: 6 %
Neutro Abs: 8 10*3/uL — ABNORMAL HIGH (ref 1.7–7.7)
Neutrophils Relative %: 82 %
Platelet Count: 180 10*3/uL (ref 150–400)
RBC: 4.97 MIL/uL (ref 3.87–5.11)
RDW: 15.5 % (ref 11.5–15.5)
WBC Count: 9.6 10*3/uL (ref 4.0–10.5)
nRBC: 0 % (ref 0.0–0.2)

## 2019-04-01 LAB — TSH: TSH: 4.132 u[IU]/mL — ABNORMAL HIGH (ref 0.308–3.960)

## 2019-04-01 NOTE — Progress Notes (Signed)
DISCONTINUE ON PATHWAY REGIMEN - Non-Small Cell Lung     A cycle is every 21 days:     Pembrolizumab      Paclitaxel      Carboplatin   **Always confirm dose/schedule in your pharmacy ordering system**  REASON: Disease Progression PRIOR TREATMENT: [id number]: Pembrolizumab 200 mg + Carboplatin AUC=6 + Paclitaxel 200 mg/m2 q21 Days x 4 Cycles TREATMENT RESPONSE: Progressive Disease (PD)  START OFF PATHWAY REGIMEN - Non-Small Cell Lung   [id number]:Gemcitabine 1,000 mg/m2 D1, 8  q21 Days:   A cycle is every 21 days:     Gemcitabine   **Always confirm dose/schedule in your pharmacy ordering system**  Patient Characteristics: Stage IV Metastatic, Squamous, PS = 0, 1, Second Line, Prior PD-1/PD-L1 Inhibitor + Chemotherapy or No Prior PD-1/PD-L1 Inhibitor, and Not a Candidate for Immunotherapy AJCC T Category: T3 Current Disease Status: Distant Metastases AJCC N Category: N2 AJCC M Category: M1a AJCC 8 Stage Grouping: IVA Histology: Squamous Cell Line of therapy: Second Line ECOG Performance Status: 1 PD-L1 Expression Status: Did Not Order Test Immunotherapy Candidate Status: Not a Candidate for Immunotherapy Prior Immunotherapy Status: Prior PD-1/PD-L1 Inhibitor + Chemotherapy Intent of Therapy: Non-Curative / Palliative Intent, Discussed with Patient

## 2019-04-01 NOTE — Patient Instructions (Signed)
Gemcitabine injection What is this medicine? GEMCITABINE (jem SYE ta been) is a chemotherapy drug. This medicine is used to treat many types of cancer like breast cancer, lung cancer, pancreatic cancer, and ovarian cancer. This medicine may be used for other purposes; ask your health care provider or pharmacist if you have questions. COMMON BRAND NAME(S): Gemzar, Infugem What should I tell my health care provider before I take this medicine? They need to know if you have any of these conditions:  blood disorders  infection  kidney disease  liver disease  lung or breathing disease, like asthma  recent or ongoing radiation therapy  an unusual or allergic reaction to gemcitabine, other chemotherapy, other medicines, foods, dyes, or preservatives  pregnant or trying to get pregnant  breast-feeding How should I use this medicine? This drug is given as an infusion into a vein. It is administered in a hospital or clinic by a specially trained health care professional. Talk to your pediatrician regarding the use of this medicine in children. Special care may be needed. Overdosage: If you think you have taken too much of this medicine contact a poison control center or emergency room at once. NOTE: This medicine is only for you. Do not share this medicine with others. What if I miss a dose? It is important not to miss your dose. Call your doctor or health care professional if you are unable to keep an appointment. What may interact with this medicine?  medicines to increase blood counts like filgrastim, pegfilgrastim, sargramostim  some other chemotherapy drugs like cisplatin  vaccines Talk to your doctor or health care professional before taking any of these medicines:  acetaminophen  aspirin  ibuprofen  ketoprofen  naproxen This list may not describe all possible interactions. Give your health care provider a list of all the medicines, herbs, non-prescription drugs, or  dietary supplements you use. Also tell them if you smoke, drink alcohol, or use illegal drugs. Some items may interact with your medicine. What should I watch for while using this medicine? Visit your doctor for checks on your progress. This drug may make you feel generally unwell. This is not uncommon, as chemotherapy can affect healthy cells as well as cancer cells. Report any side effects. Continue your course of treatment even though you feel ill unless your doctor tells you to stop. In some cases, you may be given additional medicines to help with side effects. Follow all directions for their use. Call your doctor or health care professional for advice if you get a fever, chills or sore throat, or other symptoms of a cold or flu. Do not treat yourself. This drug decreases your body's ability to fight infections. Try to avoid being around people who are sick. This medicine may increase your risk to bruise or bleed. Call your doctor or health care professional if you notice any unusual bleeding. Be careful brushing and flossing your teeth or using a toothpick because you may get an infection or bleed more easily. If you have any dental work done, tell your dentist you are receiving this medicine. Avoid taking products that contain aspirin, acetaminophen, ibuprofen, naproxen, or ketoprofen unless instructed by your doctor. These medicines may hide a fever. Do not become pregnant while taking this medicine or for 6 months after stopping it. Women should inform their doctor if they wish to become pregnant or think they might be pregnant. Men should not father a child while taking this medicine and for 3 months after stopping it.  There is a potential for serious side effects to an unborn child. Talk to your health care professional or pharmacist for more information. Do not breast-feed an infant while taking this medicine or for at least 1 week after stopping it. Men should inform their doctors if they wish  to father a child. This medicine may lower sperm counts. Talk with your doctor or health care professional if you are concerned about your fertility. What side effects may I notice from receiving this medicine? Side effects that you should report to your doctor or health care professional as soon as possible:  allergic reactions like skin rash, itching or hives, swelling of the face, lips, or tongue  breathing problems  pain, redness, or irritation at site where injected  signs and symptoms of a dangerous change in heartbeat or heart rhythm like chest pain; dizziness; fast or irregular heartbeat; palpitations; feeling faint or lightheaded, falls; breathing problems  signs of decreased platelets or bleeding - bruising, pinpoint red spots on the skin, black, tarry stools, blood in the urine  signs of decreased red blood cells - unusually weak or tired, feeling faint or lightheaded, falls  signs of infection - fever or chills, cough, sore throat, pain or difficulty passing urine  signs and symptoms of kidney injury like trouble passing urine or change in the amount of urine  signs and symptoms of liver injury like dark yellow or brown urine; general ill feeling or flu-like symptoms; light-colored stools; loss of appetite; nausea; right upper belly pain; unusually weak or tired; yellowing of the eyes or skin  swelling of ankles, feet, hands Side effects that usually do not require medical attention (report to your doctor or health care professional if they continue or are bothersome):  constipation  diarrhea  hair loss  loss of appetite  nausea  rash  vomiting This list may not describe all possible side effects. Call your doctor for medical advice about side effects. You may report side effects to FDA at 1-800-FDA-1088. Where should I keep my medicine? This drug is given in a hospital or clinic and will not be stored at home. NOTE: This sheet is a summary. It may not cover all  possible information. If you have questions about this medicine, talk to your doctor, pharmacist, or health care provider.  2020 Elsevier/Gold Standard (2017-05-23 18:06:11)

## 2019-04-01 NOTE — Progress Notes (Signed)
[facility] [phone]   Fax:([phone]) [phone]  OFFICE PROGRESS NOTE  [name], MD [address] [place] [address]  DIAGNOSIS: Metastatic non-small cell carcinoma (T3, N2, M1a) non-small cell lung cancer diagnosed in November 2018 and presented with large right suprahilar and mediastinal mass as well as recent right pleural effusion.  Guardant 360: No actionable mutations.  PRIOR THERAPY:  1) Palliative radiotherapy to the large mediastinal mass under the care of Dr. Lisbeth Renshaw. 2) Systemic chemotherapy with carboplatin for AUC of 5, paclitaxel 175 mg/M2 and Keytruda 200 mg IV every 3 weeks.  First dose expected May 01, 2017.  Status post 3 cycles. 3) Maintenance treatment with immunotherapy with single agent Keytruda 200 mg IV every 3 weeks.  First cycle 07/24/2017.  Status post 31 cycles.  This was discontinued secondary to disease progression.  CURRENT THERAPY: Systemic chemotherapy with single agent gemcitabine 1000 mg/M2 on days 1 and 8 every 3 weeks.  First dose April 08, 2019.  INTERVAL HISTORY: Cassidy Sanders 79 y.o. female returns to the clinic today for follow-up visit.  She was accompanied today by her daughter [name].  The patient is feeling fine today with no concerning complaints except for fatigue and shortness of breath with exertion.  She completed a tapered dose of prednisone and tolerating it fairly well.  She has almost complete resolution of the skin rash on the chest and back.  She also use hydroxyzine as needed for itching.  She denied having any current chest pain, cough or hemoptysis.  She denied having any weight loss or night sweats.  She has no nausea, vomiting, diarrhea or constipation.  She has no headache or visual changes.  The patient is here today for evaluation and discussion of her treatment options.  MEDICAL HISTORY: Past Medical History:  Diagnosis Date  . Arrhythmia   . Atrial fibrillation ([place])   . Brain  tumor (benign) ([place])   . Depression   . Dyspnea    home oxygen  . Dysrhythmia    a fib  . History of home oxygen therapy 02/2017   2 L/minute  . Hypertension     ALLERGIES:  is allergic to latex; gabapentin; and tape.  MEDICATIONS:  Current Outpatient Medications  Medication Sig Dispense Refill  . albuterol (PROVENTIL) (2.5 MG/3ML) 0.083% nebulizer solution VVN Q 4 H PRF WHEEZING OR SHORTNESS OF BREATH 360 mL 2  . Biotin 2.5 MG TABS Take 2,500 mcg by mouth daily.    . clobetasol cream (TEMOVATE) 7.82 % Apply 1 application topically 2 (two) times daily. 60 g 0  . diphenhydrAMINE (BENADRYL) 25 mg capsule Take 1 capsule (25 mg total) by mouth at bedtime as needed for sleep. 30 capsule 0  . hydrocortisone cream 0.5 % Apply 1 application topically 2 (two) times daily as needed for itching. 30 g 0  . hydrOXYzine (ATARAX/VISTARIL) 10 MG tablet Take 1 tablet (10 mg total) by mouth 3 (three) times daily as needed for itching. 90 tablet 1  . ketoconazole (NIZORAL) 2 % shampoo APP EXT 3 TIMES A WK    . levalbuterol (XOPENEX) 0.63 MG/3ML nebulizer solution Take 3 mLs (0.63 mg total) by nebulization every 6 (six) hours as needed for wheezing or shortness of breath. 3 mL 12  . levothyroxine (SYNTHROID) 75 MCG tablet TAKE 1 TABLET(75 MCG) BY MOUTH DAILY BEFORE BREAKFAST 30 tablet 2  . metoprolol tartrate (LOPRESSOR) 25 MG tablet Take 25 mg by mouth daily.     Marland Kitchen  mupirocin ointment (BACTROBAN) 2 % APP EXT AA TID    . predniSONE (DELTASONE) 20 MG tablet Take 4 tablets (80 mg) daily for 1 week, take 3 tablets (60 mg) daily on week 2, take two tablets (40 mg) daily on week 3, and 1 tablet (20 mg) daily on week 4 70 tablet 0  . Respiratory Therapy Supplies (FLUTTER) DEVI Use as directed. 1 each 0  . sertraline (ZOLOFT) 50 MG tablet Take 50 mg by mouth daily.     No current facility-administered medications for this visit.    SURGICAL HISTORY:  Past Surgical History:  Procedure Laterality Date  .  ABDOMINAL HYSTERECTOMY    . BLADDER SURGERY    . BRAIN SURGERY    . ENDOBRONCHIAL ULTRASOUND Bilateral 02/06/2017   Procedure: ENDOBRONCHIAL ULTRASOUND;  Surgeon: Juanito Doom, MD;  Location: [facility];  Service: Cardiopulmonary;  Laterality: Bilateral;  . IR GUIDED DRAIN W CATHETER PLACEMENT  06/29/2017  . IR REMOVAL OF PLURAL CATH W/CUFF  08/28/2017  . IR US GUIDE BX ASP/DRAIN  06/29/2017  . VIDEO BRONCHOSCOPY WITH ENDOBRONCHIAL ULTRASOUND N/A 03/23/2017   Procedure: VIDEO BRONCHOSCOPY WITH ENDOBRONCHIAL ULTRASOUND;  Surgeon: Juanito Doom, MD;  Location: [facility] OR;  Service: Thoracic;  Laterality: N/A;    REVIEW OF SYSTEMS:  Constitutional: positive for fatigue Eyes: negative Ears, nose, mouth, throat, and face: negative Respiratory: positive for dyspnea on exertion Cardiovascular: negative Gastrointestinal: negative Genitourinary:negative Integument/breast: positive for pruritus and skin lesion(s) Hematologic/lymphatic: negative Musculoskeletal:negative Neurological: negative Behavioral/Psych: negative Endocrine: negative Allergic/Immunologic: negative   PHYSICAL EXAMINATION: General appearance: alert, cooperative, fatigued and no distress Head: Normocephalic, without obvious abnormality, atraumatic Neck: no adenopathy, no JVD, supple, symmetrical, trachea midline and thyroid not enlarged, symmetric, no tenderness/mass/nodules Lymph nodes: Cervical, supraclavicular, and axillary nodes normal. Resp: clear to auscultation bilaterally Back: symmetric, no curvature. ROM normal. No CVA tenderness. Cardio: regular rate and rhythm, S1, S2 normal, no murmur, click, rub or gallop GI: soft, non-tender; bowel sounds normal; no masses,  no organomegaly Extremities: extremities normal, atraumatic, no cyanosis or edema Neurologic: Alert and oriented X 3, normal strength and tone. Normal symmetric reflexes. Normal coordination and gait  ECOG PERFORMANCE STATUS: 1 - Symptomatic but  completely ambulatory  Blood pressure 119/77, pulse (!) 102, temperature 98.3 F (36.8 C), temperature source Temporal, resp. rate 20, height 5\' 9"  (1.753 m), weight 176 lb 1.6 oz (79.9 kg), SpO2 98 %.  LABORATORY DATA: Lab Results  Component Value Date   WBC 9.6 04/01/2019   HGB 13.1 04/01/2019   HCT 42.5 04/01/2019   MCV 85.5 04/01/2019   PLT 180 04/01/2019      Chemistry      Component Value Date/Time   NA 143 03/04/2019 1402   NA 143 03/15/2017 1436   K 3.8 03/04/2019 1402   K 3.7 03/15/2017 1436   CL 106 03/04/2019 1402   CO2 27 03/04/2019 1402   CO2 34 (H) 03/15/2017 1436   BUN 18 03/04/2019 1402   BUN 17.8 03/15/2017 1436   CREATININE 0.82 03/04/2019 1402   CREATININE 0.8 03/15/2017 1436      Component Value Date/Time   CALCIUM 8.8 (L) 03/04/2019 1402   CALCIUM 9.6 03/15/2017 1436   ALKPHOS 135 (H) 03/04/2019 1402   ALKPHOS 88 03/15/2017 1436   AST 19 03/04/2019 1402   AST 16 03/15/2017 1436   ALT 14 03/04/2019 1402   ALT 15 03/15/2017 1436   BILITOT 0.5 03/04/2019 1402   BILITOT 0.52 03/15/2017 1436  RADIOGRAPHIC STUDIES: No results found.  ASSESSMENT AND PLAN: This is a very pleasant 79 years old white female recently diagnosed with probably stage IV non-small cell lung cancer presented with large right suprahilar lung mass in addition to mediastinal lymphadenopathy and suspicious malignant right pleural effusion.  The patient underwent systemic chemotherapy with carboplatin, paclitaxel and Keytruda status post 3 cycles.  She was tolerating this treatment well with no concerning complaints except for the last cycle when she was admitted to the hospital with C. difficile as well as dehydration and renal insufficiency.   She started treatment with single agent Ketruda (pembrolizumab) status post 31 cycles.  This treatment was discontinued secondary to slow but persistent disease progression I had a lengthy discussion with the patient and her daughter  today about her current disease,, prognosis and treatment options. I discussed with the patient several options for management of her condition including continuation of treatment with Keytruda and acceptance of the slow disease progression versus discontinuing her treatment with immunotherapy and considering the patient for treatment with single agent gemcitabine 1000 mg/M2 on days 1 and 8 every 3 weeks versus palliative care.  The patient is interested in proceeding with systemic chemotherapy with gemcitabine.  I discussed with her the adverse effect of this treatment.  She is expected to start the first cycle of this treatment on April 08, 2019. For the skin rash, the patient has almost complete resolution of the skin rash after the treatment with a tapered dose of prednisone. For the pruritus, she will continue on treatment with hydroxyzine. For the hypothyroidism, she will continue her current treatment with levothyroxine and will continue to monitor her TSH closely and adjust her dose as needed. The patient will come back for follow-up visit in 2 weeks for evaluation and management of any adverse effect of her treatment. The patient was advised to call immediately if she has any other concerning symptoms in the interval. The patient voices understanding of current disease status and treatment options and is in agreement with the current care plan. All questions were answered. The patient knows to call the clinic with any problems, questions or concerns. We can certainly see the patient much sooner if necessary.  Disclaimer: This note was dictated with voice recognition software. Similar sounding words can inadvertently be transcribed and may not be corrected upon review.

## 2019-04-03 ENCOUNTER — Telehealth: Payer: Self-pay | Admitting: Internal Medicine

## 2019-04-03 NOTE — Telephone Encounter (Signed)
Scheduled per los. Called patient. Issues with phone, could not hear. Will try to call again and mail printout.

## 2019-04-08 ENCOUNTER — Encounter: Payer: Self-pay | Admitting: Critical Care Medicine

## 2019-04-08 ENCOUNTER — Other Ambulatory Visit: Payer: Self-pay

## 2019-04-08 ENCOUNTER — Ambulatory Visit (INDEPENDENT_AMBULATORY_CARE_PROVIDER_SITE_OTHER): Payer: Medicare Other | Admitting: Critical Care Medicine

## 2019-04-08 VITALS — BP 90/60 | HR 82 | Temp 97.3°F | Ht 69.0 in | Wt 174.6 lb

## 2019-04-08 DIAGNOSIS — C3491 Malignant neoplasm of unspecified part of right bronchus or lung: Secondary | ICD-10-CM

## 2019-04-08 DIAGNOSIS — J9611 Chronic respiratory failure with hypoxia: Secondary | ICD-10-CM

## 2019-04-08 DIAGNOSIS — J90 Pleural effusion, not elsewhere classified: Secondary | ICD-10-CM

## 2019-04-08 DIAGNOSIS — Z23 Encounter for immunization: Secondary | ICD-10-CM | POA: Diagnosis not present

## 2019-04-08 MED ORDER — ALBUTEROL SULFATE (2.5 MG/3ML) 0.083% IN NEBU: INHALATION_SOLUTION | RESPIRATORY_TRACT | 2 refills | Status: AC

## 2019-04-08 NOTE — Progress Notes (Signed)
Synopsis: Referred in January 2019 for lung mass by Cassidy Panda, MD.  Previously patient of Cassidy Sanders.  Subjective:   PATIENT ID: Cassidy Sanders GENDER: female DOB: 1941/02/03, MRN: [id number]  Chief Complaint  Patient presents with  . Follow-up    Patient is here to establish care. Patient has shortness of breath with exertion.     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  She was diagnosed in November 2018 when she presented acutely with SVC syndrome.  She underwent emergent radiotherapy of her mediastinal mass per Dr. Lisbeth Renshaw.  She completed a treatment course of carboplatin, paclitaxel, Keytruda x3 cycles, subsequently followed with Keytruda maintenance therapy from 2019 until January 2021.  Recent imaging showed disease progression.  She had a rash from [facility], which is slowly improving with the steroid cream.  She will start therapy with gemcitabine next week.  She was recently seen on 04/01/2019 by Dr. Onalee Hua reviewed.  Has a history of chronic respiratory failure requiring 2 L of oxygen, which she no longer needs at all.  Her saturations have been fine at home.  She has noticed more shortness of breath for the past few days because she has not been using breathing treatments, but she does not want to be on a maintenance inhaler.  In the past when her symptoms have been worse it with short acting bronchodilators.  She denies wheezing.  She has had some productive coughing due to postnasal drip.  She feels that overall she is doing very well.  Her weight is stable and she denies fever, chills, sweats.  She and her husband live in independent living in a senior community in [place].  Her husband has dementia, but is still able to live with her.  They have both received their Covid vaccine; she had her second dose of her maternal vaccine today.  They have continued to isolate at their senior  living community.      Past Medical History:  Diagnosis Date  . Arrhythmia   . Atrial fibrillation ([place])   . Brain tumor (benign) ([place])   . Depression   . Dyspnea    home oxygen  . Dysrhythmia    a fib  . History of home oxygen therapy 02/2017   2 L/minute  . Hypertension      Family History  Family history unknown: Yes     Past Surgical History:  Procedure Laterality Date  . ABDOMINAL HYSTERECTOMY    . BLADDER SURGERY    . BRAIN SURGERY    . ENDOBRONCHIAL ULTRASOUND Bilateral 02/06/2017   Procedure: ENDOBRONCHIAL ULTRASOUND;  Surgeon: Juanito Doom, MD;  Location: [facility] ENDOSCOPY;  Service: Cardiopulmonary;  Laterality: Bilateral;  . IR GUIDED DRAIN W CATHETER PLACEMENT  06/29/2017  . IR REMOVAL OF PLURAL CATH W/CUFF  08/28/2017  . IR US GUIDE BX ASP/DRAIN  06/29/2017  . VIDEO BRONCHOSCOPY WITH ENDOBRONCHIAL ULTRASOUND N/A 03/23/2017   Procedure: VIDEO BRONCHOSCOPY WITH ENDOBRONCHIAL ULTRASOUND;  Surgeon: Juanito Doom, MD;  Location: [facility] OR;  Service: Thoracic;  Laterality: N/A;    Social History   Socioeconomic History  . Marital status: Married    Spouse name: Not on file  . Number of children: Not on file  . Years of education: Not on file  . Highest education level: Not on file  Occupational History  . Not on file  Tobacco Use  . Smoking status: Former Smoker  Packs/day: 1.00    Years: 59.00    Pack years: 59.00    Types: Cigarettes    Quit date: 06/11/2012    Years since quitting: 6.8  . Smokeless tobacco: Never Used  Substance and Sexual Activity  . Alcohol use: Yes    Comment: Weekly   . Drug use: No  . Sexual activity: Not on file  Other Topics Concern  . Not on file  Social History Narrative  . Not on file   Social Determinants of Health   Financial Resource Strain:   . Difficulty of Paying Living Expenses: Not on file  Food Insecurity:   . Worried About Charity fundraiser in the Last Year: Not on file  . Ran Out of Food in the  Last Year: Not on file  Transportation Needs:   . Lack of Transportation (Medical): Not on file  . Lack of Transportation (Non-Medical): Not on file  Physical Activity:   . Days of Exercise per Week: Not on file  . Minutes of Exercise per Session: Not on file  Stress:   . Feeling of Stress : Not on file  Social Connections:   . Frequency of Communication with Friends and Family: Not on file  . Frequency of Social Gatherings with Friends and Family: Not on file  . Attends Religious Services: Not on file  . Active Member of Clubs or Organizations: Not on file  . Attends Archivist Meetings: Not on file  . Marital Status: Not on file  Intimate Partner Violence:   . Fear of Current or Ex-Partner: Not on file  . Emotionally Abused: Not on file  . Physically Abused: Not on file  . Sexually Abused: Not on file     Allergies  Allergen Reactions  . Latex Dermatitis  . Gabapentin Other (See Comments)    Excessive sleep   . Tape Rash     Immunization History  Administered Date(s) Administered  . Influenza Split 12/17/2017  . Influenza, High Dose Seasonal PF 12/26/2016, 12/12/2018, 12/17/2018  . Influenza-Unspecified 12/12/2011, 12/21/2014, 12/13/2015  . Moderna SARS-COVID-2 Vaccination 02/26/2019, 04/08/2019  . Pneumococcal Conjugate-13 12/11/2013  . Pneumococcal Polysaccharide-23 12/11/2012  . Td 03/13/1998    Outpatient Medications Prior to Visit  Medication Sig Dispense Refill  . albuterol (PROVENTIL) (2.5 MG/3ML) 0.083% nebulizer solution VVN Q 4 H PRF WHEEZING OR SHORTNESS OF BREATH 360 mL 2  . Biotin 2.5 MG TABS Take 2,500 mcg by mouth daily.    . clobetasol cream (TEMOVATE) 1.61 % Apply 1 application topically 2 (two) times daily. 60 g 0  . diphenhydrAMINE (BENADRYL) 25 mg capsule Take 1 capsule (25 mg total) by mouth at bedtime as needed for sleep. 30 capsule 0  . levalbuterol (XOPENEX) 0.63 MG/3ML nebulizer solution Take 3 mLs (0.63 mg total) by nebulization  every 6 (six) hours as needed for wheezing or shortness of breath. 3 mL 12  . levothyroxine (SYNTHROID) 75 MCG tablet TAKE 1 TABLET(75 MCG) BY MOUTH DAILY BEFORE BREAKFAST 30 tablet 2  . metoprolol tartrate (LOPRESSOR) 25 MG tablet Take 25 mg by mouth daily.     Marland Kitchen Respiratory Therapy Supplies (FLUTTER) DEVI Use as directed. 1 each 0  . sertraline (ZOLOFT) 50 MG tablet Take 50 mg by mouth daily.    . hydrocortisone cream 0.5 % Apply 1 application topically 2 (two) times daily as needed for itching. 30 g 0  . hydrOXYzine (ATARAX/VISTARIL) 10 MG tablet Take 1 tablet (10 mg total) by mouth  3 (three) times daily as needed for itching. 90 tablet 1  . ketoconazole (NIZORAL) 2 % shampoo APP EXT 3 TIMES A WK    . mupirocin ointment (BACTROBAN) 2 % APP EXT AA TID    . predniSONE (DELTASONE) 20 MG tablet Take 4 tablets (80 mg) daily for 1 week, take 3 tablets (60 mg) daily on week 2, take two tablets (40 mg) daily on week 3, and 1 tablet (20 mg) daily on week 4 70 tablet 0   No facility-administered medications prior to visit.    Review of Systems  Constitutional: Negative for chills, fever and weight loss.  HENT:       Postnasal drip  Respiratory: Positive for cough, sputum production and shortness of breath. Negative for wheezing.   Skin: Positive for rash.     Objective:   Vitals:   04/08/19 1129  BP: 90/60  Pulse: 82  Temp: (!) 97.3 F (36.3 C)  TempSrc: Temporal  SpO2: 96%  Weight: 174 lb 9.6 oz (79.2 kg)  Height: 5\' 9"  (1.753 m)   96% on   RA BMI Readings from Last 3 Encounters:  04/08/19 25.78 kg/m  04/01/19 26.01 kg/m  03/04/19 25.64 kg/m   Wt Readings from Last 3 Encounters:  04/08/19 174 lb 9.6 oz (79.2 kg)  04/01/19 176 lb 1.6 oz (79.9 kg)  03/04/19 173 lb 9.6 oz (78.7 kg)    Physical Exam Vitals reviewed.  Constitutional:      General: She is not in acute distress.    Appearance: Normal appearance. She is not ill-appearing.  HENT:     Head: Normocephalic and  atraumatic.     Nose:     Comments: Deferred due to masking requirement.    Mouth/Throat:     Comments: Deferred due to masking requirement. Eyes:     General: No scleral icterus. Cardiovascular:     Rate and Rhythm: Normal rate and regular rhythm.     Heart sounds: No murmur.  Pulmonary:     Comments: Wheezing comfortably on room air, clear to auscultation bilaterally.  No witnessed coughing.  No conversational dyspnea. Abdominal:     General: There is no distension.     Palpations: Abdomen is soft.     Tenderness: There is no abdominal tenderness.  Musculoskeletal:        General: No swelling or deformity.     Cervical back: Neck supple.  Lymphadenopathy:     Cervical: No cervical adenopathy.  Skin:    General: Skin is warm and dry.     Comments: Minimal erythematous plaques on chest  Neurological:     General: No focal deficit present.     Mental Status: She is alert.     Motor: No weakness.     Coordination: Coordination normal.  Psychiatric:        Mood and Affect: Mood normal.        Behavior: Behavior normal.      CBC    Component Value Date/Time   WBC 9.6 04/01/2019 0931   WBC 7.1 08/28/2017 1239   RBC 4.97 04/01/2019 0931   HGB 13.1 04/01/2019 0931   HGB 11.8 03/15/2017 1436   HCT 42.5 04/01/2019 0931   HCT 37.8 03/15/2017 1436   PLT 180 04/01/2019 0931   PLT 215 03/15/2017 1436   MCV 85.5 04/01/2019 0931   MCV 92.4 03/15/2017 1436   MCH 26.4 04/01/2019 0931   MCHC 30.8 04/01/2019 0931   RDW 15.5 04/01/2019 0931  RDW 15.3 (H) 03/15/2017 1436   LYMPHSABS 1.0 04/01/2019 0931   LYMPHSABS 1.8 03/15/2017 1436   MONOABS 0.6 04/01/2019 0931   MONOABS 0.7 03/15/2017 1436   EOSABS 0.1 04/01/2019 0931   EOSABS 0.1 03/15/2017 1436   BASOSABS 0.0 04/01/2019 0931   BASOSABS 0.0 03/15/2017 1436    CHEMISTRY No results for input(s): NA, K, CL, CO2, GLUCOSE, BUN, CREATININE, CALCIUM, MG, PHOS in the last 168 hours. Estimated Creatinine Clearance: 56.5  mL/min (by C-G formula based on SCr of 0.83 mg/dL).   Chest Imaging- films reviewed: CT chest 02/28/2019- progressive mediastinal shift to the right, near obliteration of the right upper lobe.  Persistent loculated pleural effusion on the right.  Multiple new nodules in the left lung.  Persistent collateral circulation from the upper extremity and neck.  Compared to September 2020 images.  Pulmonary Functions Testing Results: No flowsheet data found.   Echocardiogram 06/2017: LVEF 55 to 60%, moderate LVH, grade 2 diastolic dysfunction.  Mild to moderately calcified aortic and mitral valve annuli with thickened leaflets.  Severely dilated left atrium, dilated RV and RA.  Elevated PASP.     Assessment & Plan:     ICD-10-CM   1. Stage IV squamous cell carcinoma of right lung (HCC)  C34.91   2. Pleural effusion  J90   3. Chronic respiratory failure with hypoxia (HCC)  J96.11     Metastatic squamous cell carcinoma (T3, N2, M1 a) with mets to pleura.  Pleural effusion remains stable.  Unfortunately having recent progression with left lung mets on maintenance Keytruda. 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  Dyspnea on exertion -Discussed the potential benefits of long-acting inhalers, which she declines -Continue short acting bronchodilators as needed -Up-to-date on Covid, flu, pneumonia vaccines -Continue Covid precautions-social distancing, mask wearing, handwashing   RTC in 6 months.   Current Outpatient Medications:  .  albuterol (PROVENTIL) (2.5 MG/3ML) 0.083% nebulizer solution, VVN Q 4 H PRF WHEEZING OR SHORTNESS OF BREATH, Disp: 360 mL, Rfl: 2 .  Biotin 2.5 MG TABS, Take 2,500 mcg by mouth daily., Disp: , Rfl:  .  clobetasol cream (TEMOVATE) 1.19 %, Apply 1 application topically 2 (two) times daily., Disp: 60 g, Rfl: 0 .  diphenhydrAMINE (BENADRYL) 25 mg capsule, Take 1 capsule (25 mg total) by mouth at bedtime as needed for  sleep., Disp: 30 capsule, Rfl: 0 .  levalbuterol (XOPENEX) 0.63 MG/3ML nebulizer solution, Take 3 mLs (0.63 mg total) by nebulization every 6 (six) hours as needed for wheezing or shortness of breath., Disp: 3 mL, Rfl: 12 .  levothyroxine (SYNTHROID) 75 MCG tablet, TAKE 1 TABLET(75 MCG) BY MOUTH DAILY BEFORE BREAKFAST, Disp: 30 tablet, Rfl: 2 .  metoprolol tartrate (LOPRESSOR) 25 MG tablet, Take 25 mg by mouth daily. , Disp: , Rfl:  .  Respiratory Therapy Supplies (FLUTTER) DEVI, Use as directed., Disp: 1 each, Rfl: 0 .  sertraline (ZOLOFT) 50 MG tablet, Take 50 mg by mouth daily., Disp: , Rfl:    I spent 35 minutes on this encounter, including face to face time and non-face to face time spent reviewing records, charting, coordinating care, etc.   Julian Hy, DO [facility] 04/08/2019 7:15 PM

## 2019-04-08 NOTE — Patient Instructions (Addendum)
Thank you for visiting Dr. Carlis Abbott at [facility]. We recommend the following:  Return in about 6 months (around 10/06/2019).    Please do your part to reduce the spread of COVID-19.

## 2019-04-09 ENCOUNTER — Inpatient Hospital Stay: Payer: Medicare Other

## 2019-04-09 ENCOUNTER — Other Ambulatory Visit: Payer: Self-pay | Admitting: Medical Oncology

## 2019-04-09 ENCOUNTER — Other Ambulatory Visit: Payer: Self-pay

## 2019-04-09 VITALS — BP 159/86 | HR 95 | Temp 98.0°F | Resp 18 | Wt 176.2 lb

## 2019-04-09 DIAGNOSIS — C3491 Malignant neoplasm of unspecified part of right bronchus or lung: Secondary | ICD-10-CM

## 2019-04-09 DIAGNOSIS — R0609 Other forms of dyspnea: Secondary | ICD-10-CM | POA: Diagnosis not present

## 2019-04-09 DIAGNOSIS — R21 Rash and other nonspecific skin eruption: Secondary | ICD-10-CM | POA: Diagnosis not present

## 2019-04-09 DIAGNOSIS — Z5111 Encounter for antineoplastic chemotherapy: Secondary | ICD-10-CM | POA: Diagnosis not present

## 2019-04-09 DIAGNOSIS — C382 Malignant neoplasm of posterior mediastinum: Secondary | ICD-10-CM

## 2019-04-09 DIAGNOSIS — E039 Hypothyroidism, unspecified: Secondary | ICD-10-CM | POA: Diagnosis not present

## 2019-04-09 DIAGNOSIS — L299 Pruritus, unspecified: Secondary | ICD-10-CM | POA: Diagnosis not present

## 2019-04-09 DIAGNOSIS — C3490 Malignant neoplasm of unspecified part of unspecified bronchus or lung: Secondary | ICD-10-CM

## 2019-04-09 LAB — CMP (CANCER CENTER ONLY)
ALT: 19 U/L (ref 0–44)
AST: 22 U/L (ref 15–41)
Albumin: 3.1 g/dL — ABNORMAL LOW (ref 3.5–5.0)
Alkaline Phosphatase: 122 U/L (ref 38–126)
Anion gap: 8 (ref 5–15)
BUN: 15 mg/dL (ref 8–23)
CO2: 25 mmol/L (ref 22–32)
Calcium: 8.8 mg/dL — ABNORMAL LOW (ref 8.9–10.3)
Chloride: 108 mmol/L (ref 98–111)
Creatinine: 0.74 mg/dL (ref 0.44–1.00)
GFR, Est AFR Am: >60 mL/min (ref >60)
GFR, Estimated: >60 mL/min (ref >60)
Glucose, Bld: 87 mg/dL (ref 70–99)
Potassium: 3.9 mmol/L (ref 3.5–5.1)
Sodium: 141 mmol/L (ref 135–145)
Total Bilirubin: 0.8 mg/dL (ref 0.3–1.2)
Total Protein: 7 g/dL (ref 6.5–8.1)

## 2019-04-09 LAB — CBC WITH DIFFERENTIAL (CANCER CENTER ONLY)
Abs Immature Granulocytes: 0.04 10*3/uL (ref 0.00–0.07)
Basophils Absolute: 0 10*3/uL (ref 0.0–0.1)
Basophils Relative: 0 %
Eosinophils Absolute: 0.1 10*3/uL (ref 0.0–0.5)
Eosinophils Relative: 1 %
HCT: 39.9 % (ref 36.0–46.0)
Hemoglobin: 12.7 g/dL (ref 12.0–15.0)
Immature Granulocytes: 1 %
Lymphocytes Relative: 12 %
Lymphs Abs: 0.9 10*3/uL (ref 0.7–4.0)
MCH: 26.6 pg (ref 26.0–34.0)
MCHC: 31.8 g/dL (ref 30.0–36.0)
MCV: 83.6 fL (ref 80.0–100.0)
Monocytes Absolute: 0.7 10*3/uL (ref 0.1–1.0)
Monocytes Relative: 10 %
Neutro Abs: 5.6 10*3/uL (ref 1.7–7.7)
Neutrophils Relative %: 76 %
Platelet Count: 176 10*3/uL (ref 150–400)
RBC: 4.77 MIL/uL (ref 3.87–5.11)
RDW: 15.5 % (ref 11.5–15.5)
WBC Count: 7.4 10*3/uL (ref 4.0–10.5)
nRBC: 0 % (ref 0.0–0.2)

## 2019-04-09 LAB — TSH: TSH: 1.532 u[IU]/mL (ref 0.308–3.960)

## 2019-04-09 MED ORDER — SODIUM CHLORIDE 0.9 % IV SOLN
2000.0000 mg | Freq: Once | INTRAVENOUS | Status: AC
  Administered 2019-04-09: 2000 mg via INTRAVENOUS
  Filled 2019-04-09: qty 52.6

## 2019-04-09 MED ORDER — PROCHLORPERAZINE MALEATE 10 MG PO TABS
10.0000 mg | ORAL_TABLET | Freq: Once | ORAL | Status: AC
  Administered 2019-04-09: 10 mg via ORAL

## 2019-04-09 MED ORDER — SODIUM CHLORIDE 0.9 % IV SOLN
Freq: Once | INTRAVENOUS | Status: AC
  Filled 2019-04-09: qty 250

## 2019-04-09 MED ORDER — PROCHLORPERAZINE MALEATE 10 MG PO TABS
ORAL_TABLET | ORAL | Status: AC
  Filled 2019-04-09: qty 1

## 2019-04-09 MED ORDER — PROCHLORPERAZINE MALEATE 10 MG PO TABS: 10.0000 mg | ORAL_TABLET | Freq: Four times a day (QID) | ORAL | 0 refills | Status: DC | PRN

## 2019-04-09 NOTE — Patient Instructions (Signed)
[facility] Discharge Instructions for Patients Receiving Chemotherapy  Today you received the following chemotherapy agents gemcitabine  To help prevent nausea and vomiting after your treatment, we encourage you to take your nausea medication as prescribed by your physician.    If you develop nausea and vomiting that is not controlled by your nausea medication, call the clinic.   BELOW ARE SYMPTOMS THAT SHOULD BE REPORTED IMMEDIATELY:  *FEVER GREATER THAN 100.5 F  *CHILLS WITH OR WITHOUT FEVER  NAUSEA AND VOMITING THAT IS NOT CONTROLLED WITH YOUR NAUSEA MEDICATION  *UNUSUAL SHORTNESS OF BREATH  *UNUSUAL BRUISING OR BLEEDING  TENDERNESS IN MOUTH AND THROAT WITH OR WITHOUT PRESENCE OF ULCERS  *URINARY PROBLEMS  *BOWEL PROBLEMS  UNUSUAL RASH Items with * indicate a potential emergency and should be followed up as soon as possible.  Feel free to call the clinic should you have any questions or concerns. The clinic phone number is [phone].  Please show the [place] at check-in to the Emergency Department and triage nurse.  Gemcitabine injection What is this medicine? GEMCITABINE (jem SYE ta been) is a chemotherapy drug. This medicine is used to treat many types of cancer like breast cancer, lung cancer, pancreatic cancer, and ovarian cancer. This medicine may be used for other purposes; ask your health care provider or pharmacist if you have questions. COMMON BRAND NAME(S): Gemzar, Infugem What should I tell my health care provider before I take this medicine? They need to know if you have any of these conditions:  blood disorders  infection  kidney disease  liver disease  lung or breathing disease, like asthma  recent or ongoing radiation therapy  an unusual or allergic reaction to gemcitabine, other chemotherapy, other medicines, foods, dyes, or preservatives  pregnant or trying to get pregnant  breast-feeding How should I use this  medicine? This drug is given as an infusion into a vein. It is administered in a hospital or clinic by a specially trained health care professional. Talk to your pediatrician regarding the use of this medicine in children. Special care may be needed. Overdosage: If you think you have taken too much of this medicine contact a poison control center or emergency room at once. NOTE: This medicine is only for you. Do not share this medicine with others. What if I miss a dose? It is important not to miss your dose. Call your doctor or health care professional if you are unable to keep an appointment. What may interact with this medicine?  medicines to increase blood counts like filgrastim, pegfilgrastim, sargramostim  some other chemotherapy drugs like cisplatin  vaccines Talk to your doctor or health care professional before taking any of these medicines:  acetaminophen  aspirin  ibuprofen  ketoprofen  naproxen This list may not describe all possible interactions. Give your health care provider a list of all the medicines, herbs, non-prescription drugs, or dietary supplements you use. Also tell them if you smoke, drink alcohol, or use illegal drugs. Some items may interact with your medicine. What should I watch for while using this medicine? Visit your doctor for checks on your progress. This drug may make you feel generally unwell. This is not uncommon, as chemotherapy can affect healthy cells as well as cancer cells. Report any side effects. Continue your course of treatment even though you feel ill unless your doctor tells you to stop. In some cases, you may be given additional medicines to help with side effects. Follow all directions  for their use. Call your doctor or health care professional for advice if you get a fever, chills or sore throat, or other symptoms of a cold or flu. Do not treat yourself. This drug decreases your body's ability to fight infections. Try to avoid being  around people who are sick. This medicine may increase your risk to bruise or bleed. Call your doctor or health care professional if you notice any unusual bleeding. Be careful brushing and flossing your teeth or using a toothpick because you may get an infection or bleed more easily. If you have any dental work done, tell your dentist you are receiving this medicine. Avoid taking products that contain aspirin, acetaminophen, ibuprofen, naproxen, or ketoprofen unless instructed by your doctor. These medicines may hide a fever. Do not become pregnant while taking this medicine or for 6 months after stopping it. Women should inform their doctor if they wish to become pregnant or think they might be pregnant. Men should not father a child while taking this medicine and for 3 months after stopping it. There is a potential for serious side effects to an unborn child. Talk to your health care professional or pharmacist for more information. Do not breast-feed an infant while taking this medicine or for at least 1 week after stopping it. Men should inform their doctors if they wish to father a child. This medicine may lower sperm counts. Talk with your doctor or health care professional if you are concerned about your fertility. What side effects may I notice from receiving this medicine? Side effects that you should report to your doctor or health care professional as soon as possible:  allergic reactions like skin rash, itching or hives, swelling of the face, lips, or tongue  breathing problems  pain, redness, or irritation at site where injected  signs and symptoms of a dangerous change in heartbeat or heart rhythm like chest pain; dizziness; fast or irregular heartbeat; palpitations; feeling faint or lightheaded, falls; breathing problems  signs of decreased platelets or bleeding - bruising, pinpoint red spots on the skin, black, tarry stools, blood in the urine  signs of decreased red blood cells -  unusually weak or tired, feeling faint or lightheaded, falls  signs of infection - fever or chills, cough, sore throat, pain or difficulty passing urine  signs and symptoms of kidney injury like trouble passing urine or change in the amount of urine  signs and symptoms of liver injury like dark yellow or brown urine; general ill feeling or flu-like symptoms; light-colored stools; loss of appetite; nausea; right upper belly pain; unusually weak or tired; yellowing of the eyes or skin  swelling of ankles, feet, hands Side effects that usually do not require medical attention (report to your doctor or health care professional if they continue or are bothersome):  constipation  diarrhea  hair loss  loss of appetite  nausea  rash  vomiting This list may not describe all possible side effects. Call your doctor for medical advice about side effects. You may report side effects to FDA at 1-800-FDA-1088. Where should I keep my medicine? This drug is given in a hospital or clinic and will not be stored at home. NOTE: This sheet is a summary. It may not cover all possible information. If you have questions about this medicine, talk to your doctor, pharmacist, or health care provider.  2020 Elsevier/Gold Standard (2017-05-23 18:06:11)

## 2019-04-10 ENCOUNTER — Telehealth: Payer: Self-pay | Admitting: *Deleted

## 2019-04-10 NOTE — Telephone Encounter (Signed)
-----   Message from Teodoro Spray, RN sent at 04/10/2019  9:59 AM EST ----- Regarding: Dr. Blima Rich first time chemo follow-up Dr. Julien Nordmann patient first time gemzar on 04/09/19. Patient tolerated chemo well. Please call for follow-up.  Thank you

## 2019-04-10 NOTE — Telephone Encounter (Signed)
Called pt to discuss gemzar treatment received yest.  She reports that she has done well except vomited twice out of nowhere when she got home & then OK.  She denied nausea. She did say that she had some chills last hs but didn't take temp & that also went away. She knows to take her nausea meds as needed & to take temp next time she has chills & will call with concerns or questions.

## 2019-04-13 ENCOUNTER — Other Ambulatory Visit: Payer: Self-pay | Admitting: Internal Medicine

## 2019-04-13 DIAGNOSIS — Z5111 Encounter for antineoplastic chemotherapy: Secondary | ICD-10-CM

## 2019-04-14 ENCOUNTER — Other Ambulatory Visit: Payer: Self-pay

## 2019-04-14 ENCOUNTER — Other Ambulatory Visit: Payer: Self-pay | Admitting: Medical Oncology

## 2019-04-14 ENCOUNTER — Telehealth: Payer: Self-pay | Admitting: Medical Oncology

## 2019-04-14 ENCOUNTER — Other Ambulatory Visit: Payer: Self-pay | Admitting: Emergency Medicine

## 2019-04-14 ENCOUNTER — Inpatient Hospital Stay: Payer: Medicare Other | Attending: Internal Medicine | Admitting: Medical

## 2019-04-14 ENCOUNTER — Inpatient Hospital Stay: Payer: Medicare Other

## 2019-04-14 VITALS — BP 130/85 | HR 92 | Temp 97.8°F | Resp 18 | Ht 69.0 in | Wt 177.6 lb

## 2019-04-14 DIAGNOSIS — C3491 Malignant neoplasm of unspecified part of right bronchus or lung: Secondary | ICD-10-CM

## 2019-04-14 DIAGNOSIS — R350 Frequency of micturition: Secondary | ICD-10-CM | POA: Diagnosis not present

## 2019-04-14 DIAGNOSIS — R112 Nausea with vomiting, unspecified: Secondary | ICD-10-CM | POA: Diagnosis not present

## 2019-04-14 DIAGNOSIS — L299 Pruritus, unspecified: Secondary | ICD-10-CM | POA: Diagnosis not present

## 2019-04-14 DIAGNOSIS — Z5111 Encounter for antineoplastic chemotherapy: Secondary | ICD-10-CM | POA: Insufficient documentation

## 2019-04-14 DIAGNOSIS — R319 Hematuria, unspecified: Secondary | ICD-10-CM

## 2019-04-14 DIAGNOSIS — R6883 Chills (without fever): Secondary | ICD-10-CM | POA: Insufficient documentation

## 2019-04-14 DIAGNOSIS — Z79899 Other long term (current) drug therapy: Secondary | ICD-10-CM | POA: Insufficient documentation

## 2019-04-14 DIAGNOSIS — Z87891 Personal history of nicotine dependence: Secondary | ICD-10-CM | POA: Insufficient documentation

## 2019-04-14 DIAGNOSIS — R0609 Other forms of dyspnea: Secondary | ICD-10-CM | POA: Diagnosis not present

## 2019-04-14 LAB — CBC WITH DIFFERENTIAL (CANCER CENTER ONLY)
Abs Immature Granulocytes: 0.03 10*3/uL (ref 0.00–0.07)
Basophils Absolute: 0 10*3/uL (ref 0.0–0.1)
Basophils Relative: 0 %
Eosinophils Absolute: 0 10*3/uL (ref 0.0–0.5)
Eosinophils Relative: 1 %
HCT: 35.2 % — ABNORMAL LOW (ref 36.0–46.0)
Hemoglobin: 11.1 g/dL — ABNORMAL LOW (ref 12.0–15.0)
Immature Granulocytes: 1 %
Lymphocytes Relative: 17 %
Lymphs Abs: 0.6 10*3/uL — ABNORMAL LOW (ref 0.7–4.0)
MCH: 26.9 pg (ref 26.0–34.0)
MCHC: 31.5 g/dL (ref 30.0–36.0)
MCV: 85.4 fL (ref 80.0–100.0)
Monocytes Absolute: 0.2 10*3/uL (ref 0.1–1.0)
Monocytes Relative: 7 %
Neutro Abs: 2.4 10*3/uL (ref 1.7–7.7)
Neutrophils Relative %: 74 %
Platelet Count: 180 10*3/uL (ref 150–400)
RBC: 4.12 MIL/uL (ref 3.87–5.11)
RDW: 15.2 % (ref 11.5–15.5)
WBC Count: 3.2 10*3/uL — ABNORMAL LOW (ref 4.0–10.5)
nRBC: 0 % (ref 0.0–0.2)

## 2019-04-14 LAB — CMP (CANCER CENTER ONLY)
ALT: 23 U/L (ref 0–44)
AST: 30 U/L (ref 15–41)
Albumin: 2.9 g/dL — ABNORMAL LOW (ref 3.5–5.0)
Alkaline Phosphatase: 134 U/L — ABNORMAL HIGH (ref 38–126)
Anion gap: 8 (ref 5–15)
BUN: 13 mg/dL (ref 8–23)
CO2: 29 mmol/L (ref 22–32)
Calcium: 8.8 mg/dL — ABNORMAL LOW (ref 8.9–10.3)
Chloride: 105 mmol/L (ref 98–111)
Creatinine: 0.8 mg/dL (ref 0.44–1.00)
GFR, Est AFR Am: >60 mL/min (ref >60)
GFR, Estimated: >60 mL/min (ref >60)
Glucose, Bld: 84 mg/dL (ref 70–99)
Potassium: 3.6 mmol/L (ref 3.5–5.1)
Sodium: 142 mmol/L (ref 135–145)
Total Bilirubin: 0.7 mg/dL (ref 0.3–1.2)
Total Protein: 7 g/dL (ref 6.5–8.1)

## 2019-04-14 LAB — SAMPLE TO BLOOD BANK

## 2019-04-14 MED ORDER — PREDNISONE 5 MG PO TABS: ORAL_TABLET | ORAL | 0 refills | Status: DC

## 2019-04-14 NOTE — Patient Instructions (Signed)

## 2019-04-14 NOTE — Progress Notes (Signed)
Pt taking urine cup home to provide sample, verbalized understanding of home collection instructions and to bring sample back to [facility] lab.  Lab made aware.

## 2019-04-14 NOTE — Telephone Encounter (Signed)
Symptomatic - Pt not doing well. "' swollen legs, urine red, chills" Gemzar 1/27.PEr Dr Julien Nordmann pt may seen Lucianne Lei today-schedule message sent .

## 2019-04-16 ENCOUNTER — Other Ambulatory Visit: Payer: Self-pay

## 2019-04-16 ENCOUNTER — Inpatient Hospital Stay: Payer: Medicare Other

## 2019-04-16 ENCOUNTER — Encounter: Payer: Self-pay | Admitting: Internal Medicine

## 2019-04-16 ENCOUNTER — Inpatient Hospital Stay (HOSPITAL_BASED_OUTPATIENT_CLINIC_OR_DEPARTMENT_OTHER): Payer: Medicare Other | Admitting: Internal Medicine

## 2019-04-16 VITALS — BP 156/106 | HR 95 | Temp 97.8°F | Resp 19 | Ht 69.0 in | Wt 176.5 lb

## 2019-04-16 DIAGNOSIS — R0609 Other forms of dyspnea: Secondary | ICD-10-CM | POA: Diagnosis not present

## 2019-04-16 DIAGNOSIS — I10 Essential (primary) hypertension: Secondary | ICD-10-CM

## 2019-04-16 DIAGNOSIS — Z5111 Encounter for antineoplastic chemotherapy: Secondary | ICD-10-CM | POA: Diagnosis not present

## 2019-04-16 DIAGNOSIS — R112 Nausea with vomiting, unspecified: Secondary | ICD-10-CM | POA: Diagnosis not present

## 2019-04-16 DIAGNOSIS — L299 Pruritus, unspecified: Secondary | ICD-10-CM | POA: Diagnosis not present

## 2019-04-16 DIAGNOSIS — C3491 Malignant neoplasm of unspecified part of right bronchus or lung: Secondary | ICD-10-CM

## 2019-04-16 DIAGNOSIS — C382 Malignant neoplasm of posterior mediastinum: Secondary | ICD-10-CM

## 2019-04-16 DIAGNOSIS — C3490 Malignant neoplasm of unspecified part of unspecified bronchus or lung: Secondary | ICD-10-CM

## 2019-04-16 DIAGNOSIS — R350 Frequency of micturition: Secondary | ICD-10-CM | POA: Diagnosis not present

## 2019-04-16 LAB — CBC WITH DIFFERENTIAL (CANCER CENTER ONLY)
Abs Immature Granulocytes: 0.08 10*3/uL — ABNORMAL HIGH (ref 0.00–0.07)
Basophils Absolute: 0 10*3/uL (ref 0.0–0.1)
Basophils Relative: 0 %
Eosinophils Absolute: 0 10*3/uL (ref 0.0–0.5)
Eosinophils Relative: 1 %
HCT: 34.8 % — ABNORMAL LOW (ref 36.0–46.0)
Hemoglobin: 11.2 g/dL — ABNORMAL LOW (ref 12.0–15.0)
Immature Granulocytes: 2 %
Lymphocytes Relative: 11 %
Lymphs Abs: 0.6 10*3/uL — ABNORMAL LOW (ref 0.7–4.0)
MCH: 26.9 pg (ref 26.0–34.0)
MCHC: 32.2 g/dL (ref 30.0–36.0)
MCV: 83.7 fL (ref 80.0–100.0)
Monocytes Absolute: 0.6 10*3/uL (ref 0.1–1.0)
Monocytes Relative: 11 %
Neutro Abs: 4.1 10*3/uL (ref 1.7–7.7)
Neutrophils Relative %: 75 %
Platelet Count: 157 10*3/uL (ref 150–400)
RBC: 4.16 MIL/uL (ref 3.87–5.11)
RDW: 14.7 % (ref 11.5–15.5)
WBC Count: 5.3 10*3/uL (ref 4.0–10.5)
nRBC: 0 % (ref 0.0–0.2)

## 2019-04-16 LAB — CMP (CANCER CENTER ONLY)
ALT: 21 U/L (ref 0–44)
AST: 29 U/L (ref 15–41)
Albumin: 3 g/dL — ABNORMAL LOW (ref 3.5–5.0)
Alkaline Phosphatase: 148 U/L — ABNORMAL HIGH (ref 38–126)
Anion gap: 7 (ref 5–15)
BUN: 11 mg/dL (ref 8–23)
CO2: 27 mmol/L (ref 22–32)
Calcium: 8.9 mg/dL (ref 8.9–10.3)
Chloride: 107 mmol/L (ref 98–111)
Creatinine: 0.74 mg/dL (ref 0.44–1.00)
GFR, Est AFR Am: >60 mL/min (ref >60)
GFR, Estimated: >60 mL/min (ref >60)
Glucose, Bld: 104 mg/dL — ABNORMAL HIGH (ref 70–99)
Potassium: 3.4 mmol/L — ABNORMAL LOW (ref 3.5–5.1)
Sodium: 141 mmol/L (ref 135–145)
Total Bilirubin: 0.4 mg/dL (ref 0.3–1.2)
Total Protein: 7.2 g/dL (ref 6.5–8.1)

## 2019-04-16 LAB — TSH: TSH: 2.88 u[IU]/mL (ref 0.308–3.960)

## 2019-04-16 MED ORDER — PROCHLORPERAZINE MALEATE 10 MG PO TABS
10.0000 mg | ORAL_TABLET | Freq: Once | ORAL | Status: AC
  Administered 2019-04-16: 13:00:00 10 mg via ORAL

## 2019-04-16 MED ORDER — PROCHLORPERAZINE MALEATE 10 MG PO TABS
ORAL_TABLET | ORAL | Status: AC
  Filled 2019-04-16: qty 1

## 2019-04-16 MED ORDER — FUROSEMIDE 20 MG PO TABS: ORAL_TABLET | ORAL | 0 refills | Status: DC

## 2019-04-16 MED ORDER — SODIUM CHLORIDE 0.9 % IV SOLN
Freq: Once | INTRAVENOUS | Status: AC
  Filled 2019-04-16: qty 250

## 2019-04-16 MED ORDER — SODIUM CHLORIDE 0.9 % IV SOLN
2000.0000 mg | Freq: Once | INTRAVENOUS | Status: AC
  Administered 2019-04-16: 2000 mg via INTRAVENOUS
  Filled 2019-04-16: qty 52.6

## 2019-04-16 NOTE — Patient Instructions (Signed)
[facility] Discharge Instructions for Patients Receiving Chemotherapy  Today you received the following chemotherapy agents gemcitabine  To help prevent nausea and vomiting after your treatment, we encourage you to take your nausea medication as prescribed by your physician.    If you develop nausea and vomiting that is not controlled by your nausea medication, call the clinic.   BELOW ARE SYMPTOMS THAT SHOULD BE REPORTED IMMEDIATELY:  *FEVER GREATER THAN 100.5 F  *CHILLS WITH OR WITHOUT FEVER  NAUSEA AND VOMITING THAT IS NOT CONTROLLED WITH YOUR NAUSEA MEDICATION  *UNUSUAL SHORTNESS OF BREATH  *UNUSUAL BRUISING OR BLEEDING  TENDERNESS IN MOUTH AND THROAT WITH OR WITHOUT PRESENCE OF ULCERS  *URINARY PROBLEMS  *BOWEL PROBLEMS  UNUSUAL RASH Items with * indicate a potential emergency and should be followed up as soon as possible.  Feel free to call the clinic should you have any questions or concerns. The clinic phone number is [phone].  Please show the [facility] at check-in to the Emergency Department and triage nurse.  Gemcitabine injection What is this medicine? GEMCITABINE (jem SYE ta been) is a chemotherapy drug. This medicine is used to treat many types of cancer like breast cancer, lung cancer, pancreatic cancer, and ovarian cancer. This medicine may be used for other purposes; ask your health care provider or pharmacist if you have questions. COMMON BRAND NAME(S): Gemzar, Infugem What should I tell my health care provider before I take this medicine? They need to know if you have any of these conditions:  blood disorders  infection  kidney disease  liver disease  lung or breathing disease, like asthma  recent or ongoing radiation therapy  an unusual or allergic reaction to gemcitabine, other chemotherapy, other medicines, foods, dyes, or preservatives  pregnant or trying to get pregnant  breast-feeding How should I use this  medicine? This drug is given as an infusion into a vein. It is administered in a hospital or clinic by a specially trained health care professional. Talk to your pediatrician regarding the use of this medicine in children. Special care may be needed. Overdosage: If you think you have taken too much of this medicine contact a poison control center or emergency room at once. NOTE: This medicine is only for you. Do not share this medicine with others. What if I miss a dose? It is important not to miss your dose. Call your doctor or health care professional if you are unable to keep an appointment. What may interact with this medicine?  medicines to increase blood counts like filgrastim, pegfilgrastim, sargramostim  some other chemotherapy drugs like cisplatin  vaccines Talk to your doctor or health care professional before taking any of these medicines:  acetaminophen  aspirin  ibuprofen  ketoprofen  naproxen This list may not describe all possible interactions. Give your health care provider a list of all the medicines, herbs, non-prescription drugs, or dietary supplements you use. Also tell them if you smoke, drink alcohol, or use illegal drugs. Some items may interact with your medicine. What should I watch for while using this medicine? Visit your doctor for checks on your progress. This drug may make you feel generally unwell. This is not uncommon, as chemotherapy can affect healthy cells as well as cancer cells. Report any side effects. Continue your course of treatment even though you feel ill unless your doctor tells you to stop. In some cases, you may be given additional medicines to help with side effects. Follow all directions  for their use. Call your doctor or health care professional for advice if you get a fever, chills or sore throat, or other symptoms of a cold or flu. Do not treat yourself. This drug decreases your body's ability to fight infections. Try to avoid being  around people who are sick. This medicine may increase your risk to bruise or bleed. Call your doctor or health care professional if you notice any unusual bleeding. Be careful brushing and flossing your teeth or using a toothpick because you may get an infection or bleed more easily. If you have any dental work done, tell your dentist you are receiving this medicine. Avoid taking products that contain aspirin, acetaminophen, ibuprofen, naproxen, or ketoprofen unless instructed by your doctor. These medicines may hide a fever. Do not become pregnant while taking this medicine or for 6 months after stopping it. Women should inform their doctor if they wish to become pregnant or think they might be pregnant. Men should not father a child while taking this medicine and for 3 months after stopping it. There is a potential for serious side effects to an unborn child. Talk to your health care professional or pharmacist for more information. Do not breast-feed an infant while taking this medicine or for at least 1 week after stopping it. Men should inform their doctors if they wish to father a child. This medicine may lower sperm counts. Talk with your doctor or health care professional if you are concerned about your fertility. What side effects may I notice from receiving this medicine? Side effects that you should report to your doctor or health care professional as soon as possible:  allergic reactions like skin rash, itching or hives, swelling of the face, lips, or tongue  breathing problems  pain, redness, or irritation at site where injected  signs and symptoms of a dangerous change in heartbeat or heart rhythm like chest pain; dizziness; fast or irregular heartbeat; palpitations; feeling faint or lightheaded, falls; breathing problems  signs of decreased platelets or bleeding - bruising, pinpoint red spots on the skin, black, tarry stools, blood in the urine  signs of decreased red blood cells -  unusually weak or tired, feeling faint or lightheaded, falls  signs of infection - fever or chills, cough, sore throat, pain or difficulty passing urine  signs and symptoms of kidney injury like trouble passing urine or change in the amount of urine  signs and symptoms of liver injury like dark yellow or brown urine; general ill feeling or flu-like symptoms; light-colored stools; loss of appetite; nausea; right upper belly pain; unusually weak or tired; yellowing of the eyes or skin  swelling of ankles, feet, hands Side effects that usually do not require medical attention (report to your doctor or health care professional if they continue or are bothersome):  constipation  diarrhea  hair loss  loss of appetite  nausea  rash  vomiting This list may not describe all possible side effects. Call your doctor for medical advice about side effects. You may report side effects to FDA at 1-800-FDA-1088. Where should I keep my medicine? This drug is given in a hospital or clinic and will not be stored at home. NOTE: This sheet is a summary. It may not cover all possible information. If you have questions about this medicine, talk to your doctor, pharmacist, or health care provider.  2020 Elsevier/Gold Standard (2017-05-23 18:06:11)

## 2019-04-16 NOTE — Progress Notes (Signed)
[facility] [phone]   Fax:([phone]) [phone]  OFFICE PROGRESS NOTE  Patient, No Pcp Per No address on file  DIAGNOSIS: Metastatic non-small cell carcinoma (T3, N2, M1a) non-small cell lung cancer diagnosed in November 2018 and presented with large right suprahilar and mediastinal mass as well as recent right pleural effusion.  Guardant 360: No actionable mutations.  PRIOR THERAPY:  1) Palliative radiotherapy to the large mediastinal mass under the care of Dr. Lisbeth Renshaw. 2) Systemic chemotherapy with carboplatin for AUC of 5, paclitaxel 175 mg/M2 and Keytruda 200 mg IV every 3 weeks.  First dose expected May 01, 2017.  Status post 3 cycles. 3) Maintenance treatment with immunotherapy with single agent Keytruda 200 mg IV every 3 weeks.  First cycle 07/24/2017.  Status post 31 cycles.  This was discontinued secondary to disease progression.  CURRENT THERAPY: Systemic chemotherapy with single agent gemcitabine 1000 mg/M2 on days 1 and 8 every 3 weeks.  First dose April 08, 2019.  Status post day 1 of cycle #1.  INTERVAL HISTORY: Cassidy Sanders 79 y.o. female returns to the clinic today for follow-up visit accompanied by her daughter [name].  The patient is feeling fine today with no concerning complaints except for mild fatigue as well as mild swelling of the lower extremities and rash on the chest.  She mentioned that the swelling and rash started after her first dose of treatment with gemcitabine.  She denied having any current chest pain, shortness of breath, cough or hemoptysis.  She denied having any fever or chills.  She has no nausea, vomiting, diarrhea or constipation.  She denied having any headache or visual changes.  She is here today for evaluation before starting day 8 of cycle #1.   MEDICAL HISTORY: Past Medical History:  Diagnosis Date  . Arrhythmia   . Atrial fibrillation ([place])   . Brain tumor (benign) ([place])   . Depression   . Dyspnea      home oxygen  . Dysrhythmia    a fib  . History of home oxygen therapy 02/2017   2 L/minute  . Hypertension     ALLERGIES:  is allergic to latex; gabapentin; and tape.  MEDICATIONS:  Current Outpatient Medications  Medication Sig Dispense Refill  . [START ON 05/08/2019] albuterol (PROVENTIL) (2.5 MG/3ML) 0.083% nebulizer solution VVN Q 4 H PRF WHEEZING OR SHORTNESS OF BREATH 360 mL 2  . Biotin 2.5 MG TABS Take 2,500 mcg by mouth daily.    . clobetasol cream (TEMOVATE) 5.27 % Apply 1 application topically 2 (two) times daily. 60 g 0  . diphenhydrAMINE (BENADRYL) 25 mg capsule Take 1 capsule (25 mg total) by mouth at bedtime as needed for sleep. 30 capsule 0  . levalbuterol (XOPENEX) 0.63 MG/3ML nebulizer solution Take 3 mLs (0.63 mg total) by nebulization every 6 (six) hours as needed for wheezing or shortness of breath. 3 mL 12  . levothyroxine (SYNTHROID) 75 MCG tablet TAKE 1 TABLET(75 MCG) BY MOUTH DAILY BEFORE BREAKFAST 30 tablet 2  . metoprolol tartrate (LOPRESSOR) 25 MG tablet Take 25 mg by mouth daily.     . predniSONE (DELTASONE) 5 MG tablet 6 tab x 1 day, 5 tab x 1 day, 4 tab x 1 day, 3 tab x 1 day, 2 tab x 1 day, 1 tab x 1 day, stop 21 tablet 0  . prochlorperazine (COMPAZINE) 10 MG tablet TAKE 1 TABLET(10 MG) BY MOUTH EVERY 6 HOURS AS NEEDED FOR NAUSEA OR  VOMITING 30 tablet 0  . Respiratory Therapy Supplies (FLUTTER) DEVI Use as directed. 1 each 0  . sertraline (ZOLOFT) 50 MG tablet Take 50 mg by mouth daily.     No current facility-administered medications for this visit.    SURGICAL HISTORY:  Past Surgical History:  Procedure Laterality Date  . ABDOMINAL HYSTERECTOMY    . BLADDER SURGERY    . BRAIN SURGERY    . ENDOBRONCHIAL ULTRASOUND Bilateral 02/06/2017   Procedure: ENDOBRONCHIAL ULTRASOUND;  Surgeon: Juanito Doom, MD;  Location: [facility];  Service: Cardiopulmonary;  Laterality: Bilateral;  . IR GUIDED DRAIN W CATHETER PLACEMENT  06/29/2017  . IR REMOVAL  OF PLURAL CATH W/CUFF  08/28/2017  . IR US GUIDE BX ASP/DRAIN  06/29/2017  . VIDEO BRONCHOSCOPY WITH ENDOBRONCHIAL ULTRASOUND N/A 03/23/2017   Procedure: VIDEO BRONCHOSCOPY WITH ENDOBRONCHIAL ULTRASOUND;  Surgeon: Juanito Doom, MD;  Location: [facility] OR;  Service: Thoracic;  Laterality: N/A;    REVIEW OF SYSTEMS:  Constitutional: positive for fatigue Eyes: negative Ears, nose, mouth, throat, and face: negative Respiratory: positive for dyspnea on exertion Cardiovascular: negative Gastrointestinal: negative Genitourinary:negative Integument/breast: positive for rash Hematologic/lymphatic: negative Musculoskeletal:negative Neurological: negative Behavioral/Psych: negative Endocrine: negative Allergic/Immunologic: negative   PHYSICAL EXAMINATION: General appearance: alert, cooperative, fatigued and no distress Head: Normocephalic, without obvious abnormality, atraumatic Neck: no adenopathy, no JVD, supple, symmetrical, trachea midline and thyroid not enlarged, symmetric, no tenderness/mass/nodules Lymph nodes: Cervical, supraclavicular, and axillary nodes normal. Resp: clear to auscultation bilaterally Back: symmetric, no curvature. ROM normal. No CVA tenderness. Cardio: regular rate and rhythm, S1, S2 normal, no murmur, click, rub or gallop GI: soft, non-tender; bowel sounds normal; no masses,  no organomegaly Extremities: edema Trace edema bilateral Neurologic: Alert and oriented X 3, normal strength and tone. Normal symmetric reflexes. Normal coordination and gait  ECOG PERFORMANCE STATUS: 1 - Symptomatic but completely ambulatory  Blood pressure (!) 156/106, pulse 95, temperature 97.8 F (36.6 C), temperature source Temporal, resp. rate 19, height 5\' 9"  (1.753 m), weight 176 lb 8 oz (80.1 kg), SpO2 95 %.  LABORATORY DATA: Lab Results  Component Value Date   WBC 5.3 04/16/2019   HGB 11.2 (L) 04/16/2019   HCT 34.8 (L) 04/16/2019   MCV 83.7 04/16/2019   PLT 157 04/16/2019       Chemistry      Component Value Date/Time   NA 142 04/14/2019 1354   NA 143 03/15/2017 1436   K 3.6 04/14/2019 1354   K 3.7 03/15/2017 1436   CL 105 04/14/2019 1354   CO2 29 04/14/2019 1354   CO2 34 (H) 03/15/2017 1436   BUN 13 04/14/2019 1354   BUN 17.8 03/15/2017 1436   CREATININE 0.80 04/14/2019 1354   CREATININE 0.8 03/15/2017 1436      Component Value Date/Time   CALCIUM 8.8 (L) 04/14/2019 1354   CALCIUM 9.6 03/15/2017 1436   ALKPHOS 134 (H) 04/14/2019 1354   ALKPHOS 88 03/15/2017 1436   AST 30 04/14/2019 1354   AST 16 03/15/2017 1436   ALT 23 04/14/2019 1354   ALT 15 03/15/2017 1436   BILITOT 0.7 04/14/2019 1354   BILITOT 0.52 03/15/2017 1436       RADIOGRAPHIC STUDIES: No results found.  ASSESSMENT AND PLAN: This is a very pleasant 79 years old white female recently diagnosed with probably stage IV non-small cell lung cancer presented with large right suprahilar lung mass in addition to mediastinal lymphadenopathy and suspicious malignant right pleural effusion.  The patient underwent systemic  chemotherapy with carboplatin, paclitaxel and Keytruda status post 3 cycles.  She was tolerating this treatment well with no concerning complaints except for the last cycle when she was admitted to the hospital with C. difficile as well as dehydration and renal insufficiency.   She started treatment with single agent Ketruda (pembrolizumab) status post 31 cycles.  This treatment was discontinued secondary to slow but persistent disease progression The patient is currently undergoing systemic chemotherapy with single agent gemcitabine status post day 1 of cycle #1.  She tolerated the first week of her treatment well except for mild fatigue as well as mild swelling of the lower extremities and rash on the chest. Her symptoms are much better now.  I recommended for her to proceed with day 8 of cycle #1 today as planned. For the hypertension, I strongly encouraged the patient to  continue her current treatment with metoprolol and to monitor it closely at home.  She will need to discuss with her primary care physician adjustment of her medication if it persisted to be high at home. For the lower extremity edema, I started the patient on Lasix 20 mg p.o. daily as needed for the swelling.  She was also encouraged to increase her potassium rich diet when she takes the diuretic. She will come back for follow-up visit in 2 weeks for evaluation before starting day 1 of cycle #2. She was advised to call immediately if she has any concerning symptoms in the interval. The patient voices understanding of current disease status and treatment options and is in agreement with the current care plan. All questions were answered. The patient knows to call the clinic with any problems, questions or concerns. We can certainly see the patient much sooner if necessary.  Disclaimer: This note was dictated with voice recognition software. Similar sounding words can inadvertently be transcribed and may not be corrected upon review.

## 2019-04-16 NOTE — Progress Notes (Signed)
[facility] Progress Note   Cassidy Sanders [id number] [birth year] 79 y.o.  Cassidy Sanders is managed by Dr. Fanny Bien. Mohamed  Actively treated with chemotherapy/immunotherapy/hormonal therapy: yes  Current therapy: Gemcitabine  Last treated: 04/09/2019 (cycle 1, day 1)  Next scheduled appointment with provider: 04/16/2019 Assessment: Plan:    Pruritus  Stage IV squamous cell carcinoma of right lung (HCC)   Pruritus: The patient was told to use Zyrtec 10 mg p.o. once daily and Allegra 60 mg p.o. twice daily.  She was also given a 6-day prednisone taper.  I discussed with her that she could use oatmeal soap and oatmeal lotion.  Stage IV squamous cell carcinoma of the right lung: Patient continues to be followed by Dr. Fanny Bien. Mohamed and is status post cycle 1, day 1 of gemcitabine.  This was dosed on 04/09/2019.  She is scheduled to be seen in follow-up on 04/16/2019.  She states that she does not wish to have any additional doses of gemcitabine given its side effects.   Please see After Visit Summary for patient specific instructions.  Future Appointments  Date Time Provider [place]  04/16/2019 11:30 AM Curt Bears, MD [facility] None  04/16/2019 12:45 PM [facility] INFUSION [facility] None  04/29/2019 10:15 AM [facility] LAB 1 [facility] None  04/29/2019 10:45 AM Curt Bears, MD [facility] None  04/29/2019 11:45 AM [facility] INFUSION [facility] None  05/06/2019  8:45 AM [facility] LAB/FLUSH [facility] None  05/06/2019  9:30 AM [facility] INFUSION [facility] None  05/20/2019  9:00 AM [facility] LAB 4 [facility] None  05/20/2019  9:30 AM Heilingoetter, Cassandra L, PA-C [facility] None  05/20/2019 10:30 AM [facility] INFUSION [facility] None  05/27/2019  8:45 AM [facility] LAB 1 [facility] None  05/27/2019  9:30 AM [facility] INFUSION [facility] None    No orders of the defined types were placed in this encounter.       Subjective:   Patient ID:  Cassidy Sanders is a 79 y.o. (DOB [birth year]) female.  Chief Complaint:  Chief Complaint  Patient presents with  . Leg Swelling    HPI Cassidy Sanders Is a 79 y.o. female with a diagnosis of a stage IV squamous cell carcinoma of the right lung.  She continues to be followed by Dr. Julien Nordmann and is status post cycle 1, day 1 of gemcitabine.  This was dosed on 04/09/2019.  She presents to the clinic today with a report of intermittent nausea and vomiting, chills, shortness of breath and dyspnea on exertion, a bloody urine with some urinary frequency, bilateral lower extremity edema, an itching dry red rash all over her body, and fatigue.  She denies fevers back pain, pelvic pain, or flank pain.  She states that she is eating and drinking well.  She is scheduled to be seen in follow-up on 04/16/2019.  She says that she does not wish to have any additional doses of gemcitabine given its side effects.  Medications: I have reviewed the patient's current medications.  Allergies:  Allergies  Allergen Reactions  . Latex Dermatitis  . Gabapentin Other (See Comments)    Excessive sleep   . Tape Rash    Past Medical History:  Diagnosis Date  . Arrhythmia   . Atrial fibrillation ([place])   . Brain tumor (benign) ([place])   . Depression   . Dyspnea    home oxygen  . Dysrhythmia    a fib  . History of home oxygen therapy 02/2017   2 L/minute  . Hypertension  Past Surgical History:  Procedure Laterality Date  . ABDOMINAL HYSTERECTOMY    . BLADDER SURGERY    . BRAIN SURGERY    . ENDOBRONCHIAL ULTRASOUND Bilateral 02/06/2017   Procedure: ENDOBRONCHIAL ULTRASOUND;  Surgeon: Juanito Doom, MD;  Location: [facility];  Service: Cardiopulmonary;  Laterality: Bilateral;  . IR GUIDED DRAIN W CATHETER PLACEMENT  06/29/2017  . IR REMOVAL OF PLURAL CATH W/CUFF  08/28/2017  . IR US GUIDE BX ASP/DRAIN  06/29/2017  . VIDEO BRONCHOSCOPY WITH ENDOBRONCHIAL ULTRASOUND  N/A 03/23/2017   Procedure: VIDEO BRONCHOSCOPY WITH ENDOBRONCHIAL ULTRASOUND;  Surgeon: Juanito Doom, MD;  Location: [facility] OR;  Service: Thoracic;  Laterality: N/A;    Family History  Family history unknown: Yes    Social History   Socioeconomic History  . Marital status: Married    Spouse name: Not on file  . Number of children: Not on file  . Years of education: Not on file  . Highest education level: Not on file  Occupational History  . Not on file  Tobacco Use  . Smoking status: Former Smoker    Packs/day: 1.00    Years: 59.00    Pack years: 59.00    Types: Cigarettes    Quit date: 06/11/2012    Years since quitting: 6.8  . Smokeless tobacco: Never Used  Substance and Sexual Activity  . Alcohol use: Yes    Comment: Weekly   . Drug use: No  . Sexual activity: Not on file  Other Topics Concern  . Not on file  Social History Narrative  . Not on file   Social Determinants of Health   Financial Resource Strain:   . Difficulty of Paying Living Expenses: Not on file  Food Insecurity:   . Worried About Charity fundraiser in the Last Year: Not on file  . Ran Out of Food in the Last Year: Not on file  Transportation Needs:   . Lack of Transportation (Medical): Not on file  . Lack of Transportation (Non-Medical): Not on file  Physical Activity:   . Days of Exercise per Week: Not on file  . Minutes of Exercise per Session: Not on file  Stress:   . Feeling of Stress : Not on file  Social Connections:   . Frequency of Communication with Friends and Family: Not on file  . Frequency of Social Gatherings with Friends and Family: Not on file  . Attends Religious Services: Not on file  . Active Member of Clubs or Organizations: Not on file  . Attends Archivist Meetings: Not on file  . Marital Status: Not on file  Intimate Partner Violence:   . Fear of Current or Ex-Partner: Not on file  . Emotionally Abused: Not on file  . Physically Abused: Not on file  .  Sexually Abused: Not on file    Past Medical History, Surgical history, Social history, and Family history were reviewed and updated as appropriate.   Please see review of systems for further details on the patient's review from today.   Review of Systems:  Review of Systems  Constitutional: Positive for chills and fatigue. Negative for diaphoresis and fever.  HENT: Negative for facial swelling and trouble swallowing.   Respiratory: Positive for shortness of breath. Negative for cough and chest tightness.   Cardiovascular: Positive for leg swelling. Negative for chest pain.  Gastrointestinal: Positive for nausea and vomiting.  Genitourinary: Positive for hematuria.  Skin: Positive for rash.    Objective:  Physical Exam:  BP 130/85 (BP Location: Left Arm, Patient Position: Sitting)   Pulse 92   Temp 97.8 F (36.6 C) (Temporal)   Resp 18   Ht 5\' 9"  (1.753 m)   Wt 177 lb 9.6 oz (80.6 kg)   SpO2 95%   BMI 26.23 kg/m  ECOG: 1  Physical Exam Constitutional:      General: She is not in acute distress.    Appearance: She is not diaphoretic.  HENT:     Head: Normocephalic and atraumatic.  Cardiovascular:     Rate and Rhythm: Normal rate and regular rhythm.     Heart sounds: Normal heart sounds. No murmur. No friction rub. No gallop.   Pulmonary:     Effort: Pulmonary effort is normal. No respiratory distress.     Breath sounds: Normal breath sounds. No wheezing or rales.  Skin:    General: Skin is warm and dry.     Findings: Erythema and rash present.     Comments: A diffuse erythematous rash was noted over the patient's upper extremities and trunk.  Neurological:     Mental Status: She is alert.     Gait: Gait normal.  Psychiatric:        Mood and Affect: Mood normal.        Behavior: Behavior normal.        Thought Content: Thought content normal.        Judgment: Judgment normal.     Lab Review:     Component Value Date/Time   NA 142 04/14/2019 1354   NA 143  03/15/2017 1436   K 3.6 04/14/2019 1354   K 3.7 03/15/2017 1436   CL 105 04/14/2019 1354   CO2 29 04/14/2019 1354   CO2 34 (H) 03/15/2017 1436   GLUCOSE 84 04/14/2019 1354   GLUCOSE 92 03/15/2017 1436   BUN 13 04/14/2019 1354   BUN 17.8 03/15/2017 1436   CREATININE 0.80 04/14/2019 1354   CREATININE 0.8 03/15/2017 1436   CALCIUM 8.8 (L) 04/14/2019 1354   CALCIUM 9.6 03/15/2017 1436   PROT 7.0 04/14/2019 1354   PROT 6.9 03/15/2017 1436   ALBUMIN 2.9 (L) 04/14/2019 1354   ALBUMIN 3.3 (L) 03/15/2017 1436   AST 30 04/14/2019 1354   AST 16 03/15/2017 1436   ALT 23 04/14/2019 1354   ALT 15 03/15/2017 1436   ALKPHOS 134 (H) 04/14/2019 1354   ALKPHOS 88 03/15/2017 1436   BILITOT 0.7 04/14/2019 1354   BILITOT 0.52 03/15/2017 1436   GFRNONAA >60 04/14/2019 1354   GFRAA >60 04/14/2019 1354       Component Value Date/Time   WBC 3.2 (L) 04/14/2019 1354   WBC 7.1 08/28/2017 1239   RBC 4.12 04/14/2019 1354   HGB 11.1 (L) 04/14/2019 1354   HGB 11.8 03/15/2017 1436   HCT 35.2 (L) 04/14/2019 1354   HCT 37.8 03/15/2017 1436   PLT 180 04/14/2019 1354   PLT 215 03/15/2017 1436   MCV 85.4 04/14/2019 1354   MCV 92.4 03/15/2017 1436   MCH 26.9 04/14/2019 1354   MCHC 31.5 04/14/2019 1354   RDW 15.2 04/14/2019 1354   RDW 15.3 (H) 03/15/2017 1436   LYMPHSABS 0.6 (L) 04/14/2019 1354   LYMPHSABS 1.8 03/15/2017 1436   MONOABS 0.2 04/14/2019 1354   MONOABS 0.7 03/15/2017 1436   EOSABS 0.0 04/14/2019 1354   EOSABS 0.1 03/15/2017 1436   BASOSABS 0.0 04/14/2019 1354   BASOSABS 0.0 03/15/2017 1436   -------------------------------  Imaging  from last 24 hours (if applicable):  Radiology interpretation: No results found.

## 2019-04-23 ENCOUNTER — Other Ambulatory Visit: Payer: Self-pay | Admitting: Internal Medicine

## 2019-04-23 DIAGNOSIS — Z5111 Encounter for antineoplastic chemotherapy: Secondary | ICD-10-CM

## 2019-04-24 ENCOUNTER — Telehealth: Payer: Self-pay | Admitting: *Deleted

## 2019-04-24 NOTE — Telephone Encounter (Signed)
Received call from pt stating that her BP is low.  She reports that systolic is 40-814 & diastolic is 48-18.  She has stopped her BP med & states I don't know what else to do. She reports not feeling well-swimmy-headed. Message routed to Dr Julien Nordmann.

## 2019-04-25 ENCOUNTER — Telehealth: Payer: Self-pay | Admitting: Medical Oncology

## 2019-04-25 NOTE — Telephone Encounter (Signed)
She feels better today . She called her PCP who told her to stop her BP medication and to increase fluids. She declined coming in today for [facility]. "I cannot take the chemo anymore if it is going to make me feel so bad."

## 2019-04-27 ENCOUNTER — Other Ambulatory Visit: Payer: Self-pay | Admitting: Internal Medicine

## 2019-04-27 DIAGNOSIS — Z5111 Encounter for antineoplastic chemotherapy: Secondary | ICD-10-CM

## 2019-04-29 ENCOUNTER — Inpatient Hospital Stay: Payer: Medicare Other

## 2019-04-29 ENCOUNTER — Encounter: Payer: Self-pay | Admitting: Internal Medicine

## 2019-04-29 ENCOUNTER — Inpatient Hospital Stay (HOSPITAL_BASED_OUTPATIENT_CLINIC_OR_DEPARTMENT_OTHER): Payer: Medicare Other | Admitting: Internal Medicine

## 2019-04-29 ENCOUNTER — Other Ambulatory Visit: Payer: Self-pay

## 2019-04-29 ENCOUNTER — Other Ambulatory Visit: Payer: Self-pay | Admitting: Lab

## 2019-04-29 VITALS — BP 141/86 | HR 107 | Temp 98.7°F | Resp 17 | Ht 69.0 in | Wt 170.8 lb

## 2019-04-29 VITALS — HR 99

## 2019-04-29 DIAGNOSIS — C3490 Malignant neoplasm of unspecified part of unspecified bronchus or lung: Secondary | ICD-10-CM | POA: Diagnosis not present

## 2019-04-29 DIAGNOSIS — R350 Frequency of micturition: Secondary | ICD-10-CM | POA: Diagnosis not present

## 2019-04-29 DIAGNOSIS — C382 Malignant neoplasm of posterior mediastinum: Secondary | ICD-10-CM

## 2019-04-29 DIAGNOSIS — Z5111 Encounter for antineoplastic chemotherapy: Secondary | ICD-10-CM | POA: Diagnosis not present

## 2019-04-29 DIAGNOSIS — C3491 Malignant neoplasm of unspecified part of right bronchus or lung: Secondary | ICD-10-CM

## 2019-04-29 DIAGNOSIS — R112 Nausea with vomiting, unspecified: Secondary | ICD-10-CM | POA: Diagnosis not present

## 2019-04-29 DIAGNOSIS — R0609 Other forms of dyspnea: Secondary | ICD-10-CM | POA: Diagnosis not present

## 2019-04-29 DIAGNOSIS — L299 Pruritus, unspecified: Secondary | ICD-10-CM | POA: Diagnosis not present

## 2019-04-29 LAB — TSH: TSH: 6.13 u[IU]/mL — ABNORMAL HIGH (ref 0.308–3.960)

## 2019-04-29 LAB — CBC WITH DIFFERENTIAL (CANCER CENTER ONLY)
Abs Immature Granulocytes: 0.09 10*3/uL — ABNORMAL HIGH (ref 0.00–0.07)
Basophils Absolute: 0 10*3/uL (ref 0.0–0.1)
Basophils Relative: 1 %
Eosinophils Absolute: 0 10*3/uL (ref 0.0–0.5)
Eosinophils Relative: 0 %
HCT: 36.3 % (ref 36.0–46.0)
Hemoglobin: 11.5 g/dL — ABNORMAL LOW (ref 12.0–15.0)
Immature Granulocytes: 1 %
Lymphocytes Relative: 10 %
Lymphs Abs: 0.7 10*3/uL (ref 0.7–4.0)
MCH: 27.1 pg (ref 26.0–34.0)
MCHC: 31.7 g/dL (ref 30.0–36.0)
MCV: 85.4 fL (ref 80.0–100.0)
Monocytes Absolute: 1 10*3/uL (ref 0.1–1.0)
Monocytes Relative: 15 %
Neutro Abs: 4.8 10*3/uL (ref 1.7–7.7)
Neutrophils Relative %: 73 %
Platelet Count: 393 10*3/uL (ref 150–400)
RBC: 4.25 MIL/uL (ref 3.87–5.11)
RDW: 15.9 % — ABNORMAL HIGH (ref 11.5–15.5)
WBC Count: 6.6 10*3/uL (ref 4.0–10.5)
nRBC: 0 % (ref 0.0–0.2)

## 2019-04-29 LAB — CMP (CANCER CENTER ONLY)
ALT: 20 U/L (ref 0–44)
AST: 28 U/L (ref 15–41)
Albumin: 2.7 g/dL — ABNORMAL LOW (ref 3.5–5.0)
Alkaline Phosphatase: 174 U/L — ABNORMAL HIGH (ref 38–126)
Anion gap: 9 (ref 5–15)
BUN: 13 mg/dL (ref 8–23)
CO2: 30 mmol/L (ref 22–32)
Calcium: 8.8 mg/dL — ABNORMAL LOW (ref 8.9–10.3)
Chloride: 104 mmol/L (ref 98–111)
Creatinine: 0.83 mg/dL (ref 0.44–1.00)
GFR, Est AFR Am: >60 mL/min (ref >60)
GFR, Estimated: >60 mL/min (ref >60)
Glucose, Bld: 117 mg/dL — ABNORMAL HIGH (ref 70–99)
Potassium: 3.6 mmol/L (ref 3.5–5.1)
Sodium: 143 mmol/L (ref 135–145)
Total Bilirubin: 0.3 mg/dL (ref 0.3–1.2)
Total Protein: 6.9 g/dL (ref 6.5–8.1)

## 2019-04-29 MED ORDER — SODIUM CHLORIDE 0.9 % IV SOLN
Freq: Once | INTRAVENOUS | Status: AC
  Filled 2019-04-29: qty 250

## 2019-04-29 MED ORDER — SODIUM CHLORIDE 0.9 % IV SOLN
Freq: Once | INTRAVENOUS | Status: DC
  Filled 2019-04-29: qty 250

## 2019-04-29 MED ORDER — PROCHLORPERAZINE MALEATE 10 MG PO TABS
ORAL_TABLET | ORAL | Status: AC
  Filled 2019-04-29: qty 1

## 2019-04-29 MED ORDER — SODIUM CHLORIDE 0.9 % IV SOLN
800.0000 mg/m2 | Freq: Once | INTRAVENOUS | Status: AC
  Administered 2019-04-29: 1558 mg via INTRAVENOUS
  Filled 2019-04-29: qty 40.98

## 2019-04-29 MED ORDER — PROCHLORPERAZINE MALEATE 10 MG PO TABS
10.0000 mg | ORAL_TABLET | Freq: Once | ORAL | Status: AC
  Administered 2019-04-29: 10 mg via ORAL

## 2019-04-29 NOTE — Patient Instructions (Signed)
[facility] Discharge Instructions for Patients Receiving Chemotherapy  Today you received the following chemotherapy agents gemcitabine  To help prevent nausea and vomiting after your treatment, we encourage you to take your nausea medication as prescribed by your physician.    If you develop nausea and vomiting that is not controlled by your nausea medication, call the clinic.   BELOW ARE SYMPTOMS THAT SHOULD BE REPORTED IMMEDIATELY:  *FEVER GREATER THAN 100.5 F  *CHILLS WITH OR WITHOUT FEVER  NAUSEA AND VOMITING THAT IS NOT CONTROLLED WITH YOUR NAUSEA MEDICATION  *UNUSUAL SHORTNESS OF BREATH  *UNUSUAL BRUISING OR BLEEDING  TENDERNESS IN MOUTH AND THROAT WITH OR WITHOUT PRESENCE OF ULCERS  *URINARY PROBLEMS  *BOWEL PROBLEMS  UNUSUAL RASH Items with * indicate a potential emergency and should be followed up as soon as possible.  Feel free to call the clinic should you have any questions or concerns. The clinic phone number is [phone].  Please show the [place] at check-in to the Emergency Department and triage nurse.

## 2019-04-29 NOTE — Progress Notes (Signed)
[facility] [phone]   [phone]  OFFICE PROGRESS NOTE  [name], MD [address] [place] [address]  DIAGNOSIS: Metastatic non-small cell carcinoma (T3, N2, M1a) non-small cell lung cancer diagnosed in November 2018 and presented with large right suprahilar and mediastinal mass as well as recent right pleural effusion.  Guardant 360: No actionable mutations.  PRIOR THERAPY:  1) Palliative radiotherapy to the large mediastinal mass under the care of Dr. Lisbeth Renshaw. 2) Systemic chemotherapy with carboplatin for AUC of 5, paclitaxel 175 mg/M2 and Keytruda 200 mg IV every 3 weeks.  First dose expected May 01, 2017.  Status post 3 cycles. 3) Maintenance treatment with immunotherapy with single agent Keytruda 200 mg IV every 3 weeks.  First cycle 07/24/2017.  Status post 31 cycles.  This was discontinued secondary to disease progression.  CURRENT THERAPY: Systemic chemotherapy with single agent gemcitabine 1000 mg/M2 on days 1 and 8 every 3 weeks.  First dose April 08, 2019.  Status post 1 cycle.  Starting from cycle #2 I will reduce the dose of gemcitabine to 800 mg/M2 on days 1 and 8 every 3 weeks.  INTERVAL HISTORY: Cassidy Sanders 79 y.o. female returns to the clinic today for follow-up visit accompanied by her daughter [name].  The patient is complaining of intermittent dizzy spells and generalized fatigue.  Her blood pressure medication was discontinued by her primary care physician because of the dizzy spells and hypotension.  She denied having any current chest pain, shortness of breath, cough or hemoptysis.  She has no nausea, vomiting, diarrhea or constipation.  She denied having any headache or visual changes.  She is here today for evaluation before starting cycle #2 of her treatment.   MEDICAL HISTORY: Past Medical History:  Diagnosis Date  . Arrhythmia   . Atrial fibrillation ([place])   . Brain tumor (benign) ([place])   .  Depression   . Dyspnea    home oxygen  . Dysrhythmia    a fib  . History of home oxygen therapy 02/2017   2 L/minute  . Hypertension     ALLERGIES:  is allergic to latex; gabapentin; and tape.  MEDICATIONS:  Current Outpatient Medications  Medication Sig Dispense Refill  . [START ON 05/08/2019] albuterol (PROVENTIL) (2.5 MG/3ML) 0.083% nebulizer solution VVN Q 4 H PRF WHEEZING OR SHORTNESS OF BREATH 360 mL 2  . Biotin 2.5 MG TABS Take 2,500 mcg by mouth daily.    . clobetasol cream (TEMOVATE) 1.69 % Apply 1 application topically 2 (two) times daily. 60 g 0  . diphenhydrAMINE (BENADRYL) 25 mg capsule Take 1 capsule (25 mg total) by mouth at bedtime as needed for sleep. 30 capsule 0  . furosemide (LASIX) 20 MG tablet TAKE 1 TABLET BY MOUTH DAILY AS NEEDED FOR SWELLING 10 tablet 0  . levalbuterol (XOPENEX) 0.63 MG/3ML nebulizer solution Take 3 mLs (0.63 mg total) by nebulization every 6 (six) hours as needed for wheezing or shortness of breath. 3 mL 12  . levothyroxine (SYNTHROID) 75 MCG tablet TAKE 1 TABLET(75 MCG) BY MOUTH DAILY BEFORE BREAKFAST 30 tablet 2  . metoprolol tartrate (LOPRESSOR) 25 MG tablet Take 25 mg by mouth daily.     . predniSONE (DELTASONE) 5 MG tablet 6 tab x 1 day, 5 tab x 1 day, 4 tab x 1 day, 3 tab x 1 day, 2 tab x 1 day, 1 tab x 1 day, stop 21 tablet 0  . prochlorperazine (COMPAZINE)  10 MG tablet TAKE 1 TABLET(10 MG) BY MOUTH EVERY 6 HOURS AS NEEDED FOR NAUSEA OR VOMITING 30 tablet 0  . Respiratory Therapy Supplies (FLUTTER) DEVI Use as directed. 1 each 0  . sertraline (ZOLOFT) 50 MG tablet Take 50 mg by mouth daily.     No current facility-administered medications for this visit.    SURGICAL HISTORY:  Past Surgical History:  Procedure Laterality Date  . ABDOMINAL HYSTERECTOMY    . BLADDER SURGERY    . BRAIN SURGERY    . ENDOBRONCHIAL ULTRASOUND Bilateral 02/06/2017   Procedure: ENDOBRONCHIAL ULTRASOUND;  Surgeon: Juanito Doom, MD;  Location: [facility]  ENDOSCOPY;  Service: Cardiopulmonary;  Laterality: Bilateral;  . IR GUIDED DRAIN W CATHETER PLACEMENT  06/29/2017  . IR REMOVAL OF PLURAL CATH W/CUFF  08/28/2017  . IR US GUIDE BX ASP/DRAIN  06/29/2017  . VIDEO BRONCHOSCOPY WITH ENDOBRONCHIAL ULTRASOUND N/A 03/23/2017   Procedure: VIDEO BRONCHOSCOPY WITH ENDOBRONCHIAL ULTRASOUND;  Surgeon: Juanito Doom, MD;  Location: [facility] OR;  Service: Thoracic;  Laterality: N/A;    REVIEW OF SYSTEMS:  A comprehensive review of systems was negative except for: Constitutional: positive for fatigue Neurological: positive for dizziness   PHYSICAL EXAMINATION: General appearance: alert, cooperative, fatigued and no distress Head: Normocephalic, without obvious abnormality, atraumatic Neck: no adenopathy, no JVD, supple, symmetrical, trachea midline and thyroid not enlarged, symmetric, no tenderness/mass/nodules Lymph nodes: Cervical, supraclavicular, and axillary nodes normal. Resp: clear to auscultation bilaterally Back: symmetric, no curvature. ROM normal. No CVA tenderness. Cardio: regular rate and rhythm, S1, S2 normal, no murmur, click, rub or gallop GI: soft, non-tender; bowel sounds normal; no masses,  no organomegaly Extremities: edema Trace edema bilateral  ECOG PERFORMANCE STATUS: 1 - Symptomatic but completely ambulatory  Blood pressure (!) 141/86, pulse (!) 107, temperature 98.7 F (37.1 C), temperature source Temporal, resp. rate 17, height 5\' 9"  (1.753 m), weight 170 lb 12.8 oz (77.5 kg).  LABORATORY DATA: Lab Results  Component Value Date   WBC 6.6 04/29/2019   HGB 11.5 (L) 04/29/2019   HCT 36.3 04/29/2019   MCV 85.4 04/29/2019   PLT 393 04/29/2019      Chemistry      Component Value Date/Time   NA 141 04/16/2019 1126   NA 143 03/15/2017 1436   K 3.4 (L) 04/16/2019 1126   K 3.7 03/15/2017 1436   CL 107 04/16/2019 1126   CO2 27 04/16/2019 1126   CO2 34 (H) 03/15/2017 1436   BUN 11 04/16/2019 1126   BUN 17.8 03/15/2017 1436    CREATININE 0.74 04/16/2019 1126   CREATININE 0.8 03/15/2017 1436      Component Value Date/Time   CALCIUM 8.9 04/16/2019 1126   CALCIUM 9.6 03/15/2017 1436   ALKPHOS 148 (H) 04/16/2019 1126   ALKPHOS 88 03/15/2017 1436   AST 29 04/16/2019 1126   AST 16 03/15/2017 1436   ALT 21 04/16/2019 1126   ALT 15 03/15/2017 1436   BILITOT 0.4 04/16/2019 1126   BILITOT 0.52 03/15/2017 1436       RADIOGRAPHIC STUDIES: No results found.  ASSESSMENT AND PLAN: This is a very pleasant 79 years old white female recently diagnosed with probably stage IV non-small cell lung cancer presented with large right suprahilar lung mass in addition to mediastinal lymphadenopathy and suspicious malignant right pleural effusion.  The patient underwent systemic chemotherapy with carboplatin, paclitaxel and Keytruda status post 3 cycles.  She was tolerating this treatment well with no concerning complaints except for the last  cycle when she was admitted to the hospital with C. difficile as well as dehydration and renal insufficiency.   She started treatment with single agent Ketruda (pembrolizumab) status post 31 cycles.  This treatment was discontinued secondary to slow but persistent disease progression The patient is currently undergoing systemic chemotherapy with single agent gemcitabine status post 1 cycle.  She has a rough time with the first cycle of her treatment with dizzy spells as well as fatigue. I had a lengthy discussion with the patient and her daughter about her condition and treatment options.  We discussed the option of reducing her dose of gemcitabine to 800 mg/M2 on days 1 and 8 every 3 weeks versus discontinuing her treatment and consider palliative care option. The patient agreed to proceed with a reduced dose gemcitabine for now.  I will arrange for her to have repeat CT scan of the chest, abdomen pelvis after the cycle for evaluation of her disease and to be able to make further recommendation  regarding future treatment.  I will see her back for follow-up visit for evaluation before cycle #3. She was advised to call immediately if she has any other concerning symptoms in the interval.  The patient voices understanding of current disease status and treatment options and is in agreement with the current care plan. All questions were answered. The patient knows to call the clinic with any problems, questions or concerns. We can certainly see the patient much sooner if necessary.  Disclaimer: This note was dictated with voice recognition software. Similar sounding words can inadvertently be transcribed and may not be corrected upon review.

## 2019-04-29 NOTE — Progress Notes (Signed)
Diane notified infusion of gemcitabine dose reduction, Dr. Julien Nordmann has updated the orders. CMET pending at this time.   Demetrius Charity, PharmD, [facility] Pharmacist Pharmacy Phone: [phone] 04/29/2019

## 2019-05-02 ENCOUNTER — Other Ambulatory Visit: Payer: Self-pay | Admitting: Internal Medicine

## 2019-05-06 ENCOUNTER — Inpatient Hospital Stay: Payer: Medicare Other

## 2019-05-06 ENCOUNTER — Other Ambulatory Visit: Payer: Self-pay

## 2019-05-06 VITALS — BP 148/95 | HR 105 | Temp 97.8°F | Resp 18 | Wt 171.0 lb

## 2019-05-06 DIAGNOSIS — R350 Frequency of micturition: Secondary | ICD-10-CM | POA: Diagnosis not present

## 2019-05-06 DIAGNOSIS — C3491 Malignant neoplasm of unspecified part of right bronchus or lung: Secondary | ICD-10-CM | POA: Diagnosis not present

## 2019-05-06 DIAGNOSIS — R0609 Other forms of dyspnea: Secondary | ICD-10-CM | POA: Diagnosis not present

## 2019-05-06 DIAGNOSIS — Z5111 Encounter for antineoplastic chemotherapy: Secondary | ICD-10-CM | POA: Diagnosis not present

## 2019-05-06 DIAGNOSIS — R112 Nausea with vomiting, unspecified: Secondary | ICD-10-CM | POA: Diagnosis not present

## 2019-05-06 DIAGNOSIS — C382 Malignant neoplasm of posterior mediastinum: Secondary | ICD-10-CM

## 2019-05-06 DIAGNOSIS — C3490 Malignant neoplasm of unspecified part of unspecified bronchus or lung: Secondary | ICD-10-CM

## 2019-05-06 DIAGNOSIS — L299 Pruritus, unspecified: Secondary | ICD-10-CM | POA: Diagnosis not present

## 2019-05-06 LAB — CBC WITH DIFFERENTIAL (CANCER CENTER ONLY)
Abs Immature Granulocytes: 0.12 10*3/uL — ABNORMAL HIGH (ref 0.00–0.07)
Basophils Absolute: 0 10*3/uL (ref 0.0–0.1)
Basophils Relative: 1 %
Eosinophils Absolute: 0 10*3/uL (ref 0.0–0.5)
Eosinophils Relative: 0 %
HCT: 35.3 % — ABNORMAL LOW (ref 36.0–46.0)
Hemoglobin: 11.2 g/dL — ABNORMAL LOW (ref 12.0–15.0)
Immature Granulocytes: 4 %
Lymphocytes Relative: 28 %
Lymphs Abs: 0.9 10*3/uL (ref 0.7–4.0)
MCH: 26.8 pg (ref 26.0–34.0)
MCHC: 31.7 g/dL (ref 30.0–36.0)
MCV: 84.4 fL (ref 80.0–100.0)
Monocytes Absolute: 0.7 10*3/uL (ref 0.1–1.0)
Monocytes Relative: 21 %
Neutro Abs: 1.4 10*3/uL — ABNORMAL LOW (ref 1.7–7.7)
Neutrophils Relative %: 46 %
Platelet Count: 358 10*3/uL (ref 150–400)
RBC: 4.18 MIL/uL (ref 3.87–5.11)
RDW: 16 % — ABNORMAL HIGH (ref 11.5–15.5)
WBC Count: 3.1 10*3/uL — ABNORMAL LOW (ref 4.0–10.5)
nRBC: 0 % (ref 0.0–0.2)

## 2019-05-06 LAB — CMP (CANCER CENTER ONLY)
ALT: 52 U/L — ABNORMAL HIGH (ref 0–44)
AST: 89 U/L — ABNORMAL HIGH (ref 15–41)
Albumin: 2.9 g/dL — ABNORMAL LOW (ref 3.5–5.0)
Alkaline Phosphatase: 161 U/L — ABNORMAL HIGH (ref 38–126)
Anion gap: 11 (ref 5–15)
BUN: 13 mg/dL (ref 8–23)
CO2: 25 mmol/L (ref 22–32)
Calcium: 9.2 mg/dL (ref 8.9–10.3)
Chloride: 107 mmol/L (ref 98–111)
Creatinine: 0.73 mg/dL (ref 0.44–1.00)
GFR, Est AFR Am: >60 mL/min (ref >60)
GFR, Estimated: >60 mL/min (ref >60)
Glucose, Bld: 102 mg/dL — ABNORMAL HIGH (ref 70–99)
Potassium: 3.6 mmol/L (ref 3.5–5.1)
Sodium: 143 mmol/L (ref 135–145)
Total Bilirubin: 0.5 mg/dL (ref 0.3–1.2)
Total Protein: 7.3 g/dL (ref 6.5–8.1)

## 2019-05-06 LAB — TSH: TSH: 6.772 u[IU]/mL — ABNORMAL HIGH (ref 0.308–3.960)

## 2019-05-06 MED ORDER — SODIUM CHLORIDE 0.9 % IV SOLN
800.0000 mg/m2 | Freq: Once | INTRAVENOUS | Status: AC
  Administered 2019-05-06: 1558 mg via INTRAVENOUS
  Filled 2019-05-06: qty 40.98

## 2019-05-06 MED ORDER — SODIUM CHLORIDE 0.9 % IV SOLN
Freq: Once | INTRAVENOUS | Status: AC
  Filled 2019-05-06: qty 250

## 2019-05-06 MED ORDER — PROCHLORPERAZINE MALEATE 10 MG PO TABS
ORAL_TABLET | ORAL | Status: AC
  Filled 2019-05-06: qty 1

## 2019-05-06 MED ORDER — PROCHLORPERAZINE MALEATE 10 MG PO TABS
10.0000 mg | ORAL_TABLET | Freq: Once | ORAL | Status: AC
  Administered 2019-05-06: 10 mg via ORAL

## 2019-05-06 NOTE — Progress Notes (Signed)
Per Dr. Julien Nordmann, labs and vitals reviewed today and are sufficient to treat today.

## 2019-05-06 NOTE — Patient Instructions (Signed)
[facility] Discharge Instructions for Patients Receiving Chemotherapy  Today you received the following chemotherapy agents gemcitabine  To help prevent nausea and vomiting after your treatment, we encourage you to take your nausea medication as prescribed by your physician.    If you develop nausea and vomiting that is not controlled by your nausea medication, call the clinic.   BELOW ARE SYMPTOMS THAT SHOULD BE REPORTED IMMEDIATELY:  *FEVER GREATER THAN 100.5 F  *CHILLS WITH OR WITHOUT FEVER  NAUSEA AND VOMITING THAT IS NOT CONTROLLED WITH YOUR NAUSEA MEDICATION  *UNUSUAL SHORTNESS OF BREATH  *UNUSUAL BRUISING OR BLEEDING  TENDERNESS IN MOUTH AND THROAT WITH OR WITHOUT PRESENCE OF ULCERS  *URINARY PROBLEMS  *BOWEL PROBLEMS  UNUSUAL RASH Items with * indicate a potential emergency and should be followed up as soon as possible.  Feel free to call the clinic should you have any questions or concerns. The clinic phone number is [phone].  Please show the [place] at check-in to the Emergency Department and triage nurse.

## 2019-05-13 ENCOUNTER — Other Ambulatory Visit: Payer: Self-pay | Admitting: Internal Medicine

## 2019-05-13 DIAGNOSIS — E039 Hypothyroidism, unspecified: Secondary | ICD-10-CM | POA: Diagnosis not present

## 2019-05-13 DIAGNOSIS — C3491 Malignant neoplasm of unspecified part of right bronchus or lung: Secondary | ICD-10-CM | POA: Diagnosis not present

## 2019-05-13 DIAGNOSIS — I119 Hypertensive heart disease without heart failure: Secondary | ICD-10-CM | POA: Diagnosis not present

## 2019-05-19 ENCOUNTER — Other Ambulatory Visit: Payer: Self-pay

## 2019-05-19 ENCOUNTER — Ambulatory Visit (HOSPITAL_COMMUNITY)
Admission: RE | Admit: 2019-05-19 | Discharge: 2019-05-19 | Disposition: A | Discharge status: Home / Self Care | Payer: Medicare Other | Source: Ambulatory Visit | Attending: Internal Medicine | Admitting: Internal Medicine

## 2019-05-19 DIAGNOSIS — C3490 Malignant neoplasm of unspecified part of unspecified bronchus or lung: Secondary | ICD-10-CM | POA: Diagnosis not present

## 2019-05-19 MED ORDER — SODIUM CHLORIDE (PF) 0.9 % IJ SOLN
INTRAMUSCULAR | Status: AC
  Filled 2019-05-19: qty 50

## 2019-05-19 MED ORDER — IOHEXOL 300 MG/ML  SOLN
100.0000 mL | Freq: Once | INTRAMUSCULAR | Status: AC | PRN
  Administered 2019-05-19: 100 mL via INTRAVENOUS

## 2019-05-19 NOTE — Progress Notes (Signed)
[facility] OFFICE PROGRESS NOTE  [name], MD [address] [place] [address]  DIAGNOSIS: Metastatic non-small cell carcinoma (T3, N2, M1a) non-small cell lung cancer diagnosed in November 2018 and presented with large right suprahilar and mediastinal mass as well as recent right pleural effusion.  Guardant 360: No actionable mutations.  PRIOR THERAPY: 1) Palliative radiotherapy to the large mediastinal mass under the care of Dr. Lisbeth Renshaw. 2) Systemic chemotherapy with carboplatin for AUC of 5, paclitaxel 175 mg/M2 and Keytruda 200 mg IV every 3 weeks.  First dose expected May 01, 2017.  Status post 3 cycles. 3) Maintenance treatment with immunotherapy with single agent Keytruda 200 mg IV every 3 weeks.  First cycle 07/24/2017.  Status post 31 cycles.  This was discontinued secondary to disease progression.  CURRENT THERAPY: Systemic chemotherapy with single agent gemcitabine 1000 mg/M2 on days 1 and 8 every 3 weeks.  First dose April 08, 2019.  Status post 2 cycles.  Starting from cycle #2, she will be on reduced dose gemcitabine to 800 mg/M2 on days 1 and 8 every 3 weeks.  INTERVAL HISTORY: Cassidy Sanders 79 y.o. female returns to the clinic today for a follow-up visit accompanied by her daughter. The patient has been experiencing intermittent dizziness as well as generalized fatigue and weakness with treatment. She only began to recover from her fatigue about 1-2 days ago.  Dr. Julien Nordmann reduced her dose of gemcitabine to 800 mg/m starting with cycle #2 due to her symptoms.  She continued to experience these symptoms despite her dose reduced to 800 mg/m2.  Today, she is also reporting bleeding from her gums and intermittent mild nose bleeds upon blowing her nose. Denies hemoptysis, hematemesis, or melena. She is reporting orange urine. Denies dysuria. Of note, her platelets are WNL. Denies blood thinner use. Denies any fevers, chills, night sweats, or weight loss.  She has a decreased appetite and drinks boosts if she is not hungry.  She denies any chest pain, shortness of breath, cough, or hemoptysis. She denies any nausea, vomiting, diarrhea, or constipation.  She denies any headache or visual changes.  The patient recently had a restaging CT scan performed to evaluate her current condition and response to therapy.  He is here today to review her scan results before starting cycle #3.   MEDICAL HISTORY: Past Medical History:  Diagnosis Date  . Arrhythmia   . Atrial fibrillation ([place])   . Brain tumor (benign) ([place])   . Depression   . Dyspnea    home oxygen  . Dysrhythmia    a fib  . History of home oxygen therapy 02/2017   2 L/minute  . Hypertension     ALLERGIES:  is allergic to latex; gabapentin; and tape.  MEDICATIONS:  Current Outpatient Medications  Medication Sig Dispense Refill  . albuterol (PROVENTIL) (2.5 MG/3ML) 0.083% nebulizer solution VVN Q 4 H PRF WHEEZING OR SHORTNESS OF BREATH 360 mL 2  . Biotin 2.5 MG TABS Take 2,500 mcg by mouth daily.    . clobetasol cream (TEMOVATE) 6.96 % Apply 1 application topically 2 (two) times daily. 60 g 0  . diphenhydrAMINE (BENADRYL) 25 mg capsule Take 1 capsule (25 mg total) by mouth at bedtime as needed for sleep. 30 capsule 0  . furosemide (LASIX) 20 MG tablet TAKE 1 TABLET BY MOUTH DAILY AS NEEDED FOR SWELLING 10 tablet 0  . levalbuterol (XOPENEX) 0.63 MG/3ML nebulizer solution Take 3 mLs (0.63 mg total) by nebulization every 6 (six)  hours as needed for wheezing or shortness of breath. 3 mL 12  . levothyroxine (SYNTHROID) 75 MCG tablet TAKE 1 TABLET(75 MCG) BY MOUTH DAILY BEFORE BREAKFAST 30 tablet 2  . metoprolol tartrate (LOPRESSOR) 25 MG tablet Take 25 mg by mouth daily.     . predniSONE (DELTASONE) 5 MG tablet 6 tab x 1 day, 5 tab x 1 day, 4 tab x 1 day, 3 tab x 1 day, 2 tab x 1 day, 1 tab x 1 day, stop 21 tablet 0  . prochlorperazine (COMPAZINE) 10 MG tablet TAKE 1 TABLET(10 MG) BY MOUTH  EVERY 6 HOURS AS NEEDED FOR NAUSEA OR VOMITING 30 tablet 0  . Respiratory Therapy Supplies (FLUTTER) DEVI Use as directed. 1 each 0  . sertraline (ZOLOFT) 50 MG tablet Take 50 mg by mouth daily.     No current facility-administered medications for this visit.   Facility-Administered Medications Ordered in Other Visits  Medication Dose Route Frequency Provider Last Rate Last Admin  . sodium chloride (PF) 0.9 % injection             SURGICAL HISTORY:  Past Surgical History:  Procedure Laterality Date  . ABDOMINAL HYSTERECTOMY    . BLADDER SURGERY    . BRAIN SURGERY    . ENDOBRONCHIAL ULTRASOUND Bilateral 02/06/2017   Procedure: ENDOBRONCHIAL ULTRASOUND;  Surgeon: Juanito Doom, MD;  Location: [facility] ENDOSCOPY;  Service: Cardiopulmonary;  Laterality: Bilateral;  . IR GUIDED DRAIN W CATHETER PLACEMENT  06/29/2017  . IR REMOVAL OF PLURAL CATH W/CUFF  08/28/2017  . IR US GUIDE BX ASP/DRAIN  06/29/2017  . VIDEO BRONCHOSCOPY WITH ENDOBRONCHIAL ULTRASOUND N/A 03/23/2017   Procedure: VIDEO BRONCHOSCOPY WITH ENDOBRONCHIAL ULTRASOUND;  Surgeon: Juanito Doom, MD;  Location: [facility] OR;  Service: Thoracic;  Laterality: N/A;    REVIEW OF SYSTEMS:   Review of Systems  Constitutional: Positive for fatigue, decreased appetite, generalized weakness. Negative for chills, fever and unexpected weight change.  HENT: Positive for bleeding gums and mild nose bleeds. Negative for mouth sores, sore throat and trouble swallowing.   Eyes: Negative for eye problems and icterus.  Respiratory: Negative for cough, hemoptysis, shortness of breath and wheezing.   Cardiovascular: Negative for chest pain and leg swelling.  Gastrointestinal: Negative for abdominal pain, constipation, diarrhea, nausea and vomiting.  Genitourinary: Positive for darker urine. Negative for bladder incontinence, difficulty urinating, dysuria, frequency and hematuria.   Musculoskeletal: Negative for back pain, gait problem, neck pain and neck  stiffness.  Skin: Negative for itching and rash.  Neurological: Negative for dizziness, extremity weakness, gait problem, headaches, light-headedness and seizures.  Hematological: Negative for adenopathy. Does not bruise/bleed easily.  Psychiatric/Behavioral: Negative for confusion, depression and sleep disturbance. The patient is not nervous/anxious.     PHYSICAL EXAMINATION:  There were no vitals taken for this visit.  ECOG PERFORMANCE STATUS: 1 - Symptomatic but completely ambulatory  Physical Exam  Constitutional: Oriented to person, place, and time and well-developed, well-nourished, and in no distress.  HENT:  Head: Normocephalic and atraumatic.  Mouth/Throat: Oropharynx is clear and moist. No oropharyngeal exudate.  Eyes: Conjunctivae are normal. Right eye exhibits no discharge. Left eye exhibits no discharge. No scleral icterus.  Neck: Normal range of motion. Neck supple.  Cardiovascular: Normal rate, regular rhythm, normal heart sounds and intact distal pulses.   Pulmonary/Chest: Effort normal and breath sounds normal. No respiratory distress. No wheezes. No rales.  Abdominal: Soft. Bowel sounds are normal. Exhibits no distension and no mass. There is no  tenderness.  Musculoskeletal: Normal range of motion. Exhibits no edema.  Lymphadenopathy:    No cervical adenopathy.  Neurological: Alert and oriented to person, place, and time. Exhibits normal muscle tone. Gait normal. Coordination normal.  Skin: Skin is warm and dry. No rash noted. Not diaphoretic. No erythema. No pallor.  Psychiatric: Mood, memory and judgment normal.  Vitals reviewed.  LABORATORY DATA: Lab Results  Component Value Date   WBC 3.1 (L) 05/06/2019   HGB 11.2 (L) 05/06/2019   HCT 35.3 (L) 05/06/2019   MCV 84.4 05/06/2019   PLT 358 05/06/2019      Chemistry      Component Value Date/Time   NA 143 05/06/2019 0837   NA 143 03/15/2017 1436   K 3.6 05/06/2019 0837   K 3.7 03/15/2017 1436   CL 107  05/06/2019 0837   CO2 25 05/06/2019 0837   CO2 34 (H) 03/15/2017 1436   BUN 13 05/06/2019 0837   BUN 17.8 03/15/2017 1436   CREATININE 0.73 05/06/2019 0837   CREATININE 0.8 03/15/2017 1436      Component Value Date/Time   CALCIUM 9.2 05/06/2019 0837   CALCIUM 9.6 03/15/2017 1436   ALKPHOS 161 (H) 05/06/2019 0837   ALKPHOS 88 03/15/2017 1436   AST 89 (H) 05/06/2019 0837   AST 16 03/15/2017 1436   ALT 52 (H) 05/06/2019 0837   ALT 15 03/15/2017 1436   BILITOT 0.5 05/06/2019 0837   BILITOT 0.52 03/15/2017 1436       RADIOGRAPHIC STUDIES:  No results found.   ASSESSMENT/PLAN:  This is a very pleasant 79 year old Caucasian female diagnosed with probable stage IV non-small cell lung cancer. She presented with a large right suprahilar lung mass in addition to mediastinal lymphadenopathy and asuspicious malignantright pleural effusion.   The patient underwent systemic chemotherapy with carboplatin, paclitaxel and Keytruda. She is status post 3 cycles. During the last cycle of treatment, she developed C. difficile, dehydration, and renal insufficiency.  He then underwent immunotherapy with Keytruda 200 mg IV every 3 weeks. She is status post31cycles. She is tolerating treatment fairly well except for a persistent rash. This was discontinued due to evidence of disease progression.   She is currently undergoing treatment with gemcitabine.  Starting from cycle #2, her gemcitabine was reduced from 1000 mg/m to 800 mg/m on days 1 and 8 every 3 weeks. She is experiencing significant fatigue and generalized weakness with this treatment.   The patient recently underwent a CT scan of the chest, abdomen, and pelvis to evaluate her disease to be able to make further recommendations regarding future treatment.  Dr. Julien Nordmann personally and independently reviewed her scan and discussed the results with the patient and her daughter today, the scan showed slight increase in left  supraclavicular lymphadenopathy and mild mixed changes within the left pulmonary metastases, few of which have mildly increased in size.  Dr. Earlie Server had a lengthy discussion with the patient about her current condition and treatment options.  Dr. Earlie Server discussed continuing on the same treatment versus referral to palliative care/hospice versus consideration of treatment with immunotherapy with Opdivo.  The patient is not a good candidate for aggressive chemotherapy with docetaxel and Cyramza given her age and poor tolerance to milder chemotherapy with gemcitabine. She is not interested in continuing gemcitabine due to her intolerance.   Dr. Julien Nordmann discussed that we can try treatment with immunotherapy with nivolumab 240 mg IV every 2 weeks. The patient is interested in this option she is expected to start  her first dose next week.    I discussed with her the adverse effect of the immunotherapy including but not limited to immunotherapy mediated skin rash, diarrhea, inflammation of the lung, kidney, liver, thyroid or other endocrine dysfunction  We will see her back for follow-up visit in 3 weeks for evaluation before starting cycle #2.  The patient was advised to call immediately if she has any concerning symptoms in the interval. The patient voices understanding of current disease status and treatment options and is in agreement with the current care plan. All questions were answered. The patient knows to call the clinic with any problems, questions or concerns. We can certainly see the patient much sooner if necessary  No orders of the defined types were placed in this encounter.    Marva Hendryx L Tijuan Dantes, PA-C 05/19/19   ADDENDUM: Hematology/Oncology Attending: I had a face-to-face encounter with the patient today.  I recommended her care plan.  This is a very pleasant 79 years old white female with metastatic non-small cell lung cancer, squamous cell carcinoma initially started on  treatment with carboplatin, paclitaxel and Keytruda but she was unable to tolerate the systemic chemotherapy and she continued on single agent Keytruda for 31 cycles.  She has been tolerating the treatment well except for development of significant skin rash requiring treatment with a tapered dose of steroids.  She also had mild disease progression on her treatment at that time. The patient was switched to systemic chemotherapy with single agent gemcitabine status post 2 cycles and she has a rough time with this treatment with significant fatigue and weakness even after dose reduction. She had repeat CT scan of the chest, abdomen pelvis performed recently.  I personally and independently reviewed the scans and discussed the results with the patient today. Her scan showed very mild increase in some of the pulmonary nodules and lymph nodes but still consider within the range of stable disease.  The patient declined to continue with any further treatment with gemcitabine and she would like to return back to treatment with immunotherapy if possible. I discussed with the patient several options for her treatment including treatment with Keytruda again but we declined this option because of the significant skin rash she developed in the past. I discussed with the patient treatment with single agent nivolumab 240 mg IV every 2 weeks initially with close monitoring.  If the patient does well with nivolumab every 2 weeks, we may consider changing the regimen to 480 mg IV every 4 weeks in the future. Other option for her treatment included docetaxel and Cyramza but knowing the patient and her tolerance to chemotherapy I felt this will be rough on her taking into consideration her previous reaction to carboplatin and paclitaxel as well as gemcitabine. The patient is expected to start the first dose of her nivolumab next week. We will see her back for follow-up visit in 3 weeks for evaluation with the start of cycle  #2. She was advised to call immediately if she has any other concerning symptoms in the interval.  Disclaimer: This note was dictated with voice recognition software. Similar sounding words can inadvertently be transcribed and may be missed upon review. [name], MD 05/20/19

## 2019-05-20 ENCOUNTER — Telehealth: Payer: Self-pay | Admitting: Internal Medicine

## 2019-05-20 ENCOUNTER — Inpatient Hospital Stay: Payer: Medicare Other

## 2019-05-20 ENCOUNTER — Encounter: Payer: Self-pay | Admitting: Physician Assistant

## 2019-05-20 ENCOUNTER — Other Ambulatory Visit: Payer: Self-pay | Admitting: Internal Medicine

## 2019-05-20 ENCOUNTER — Inpatient Hospital Stay: Payer: Medicare Other | Attending: Internal Medicine | Admitting: Physician Assistant

## 2019-05-20 ENCOUNTER — Other Ambulatory Visit: Payer: Self-pay

## 2019-05-20 VITALS — BP 164/86 | HR 94 | Temp 97.8°F | Resp 20 | Ht 69.0 in | Wt 171.0 lb

## 2019-05-20 DIAGNOSIS — C382 Malignant neoplasm of posterior mediastinum: Secondary | ICD-10-CM

## 2019-05-20 DIAGNOSIS — R5383 Other fatigue: Secondary | ICD-10-CM | POA: Diagnosis not present

## 2019-05-20 DIAGNOSIS — Z7189 Other specified counseling: Secondary | ICD-10-CM

## 2019-05-20 DIAGNOSIS — Z79899 Other long term (current) drug therapy: Secondary | ICD-10-CM | POA: Insufficient documentation

## 2019-05-20 DIAGNOSIS — C3491 Malignant neoplasm of unspecified part of right bronchus or lung: Secondary | ICD-10-CM | POA: Insufficient documentation

## 2019-05-20 DIAGNOSIS — R040 Epistaxis: Secondary | ICD-10-CM | POA: Diagnosis not present

## 2019-05-20 DIAGNOSIS — Z5112 Encounter for antineoplastic immunotherapy: Secondary | ICD-10-CM | POA: Diagnosis not present

## 2019-05-20 DIAGNOSIS — E039 Hypothyroidism, unspecified: Secondary | ICD-10-CM | POA: Insufficient documentation

## 2019-05-20 DIAGNOSIS — R531 Weakness: Secondary | ICD-10-CM | POA: Insufficient documentation

## 2019-05-20 DIAGNOSIS — R42 Dizziness and giddiness: Secondary | ICD-10-CM | POA: Diagnosis not present

## 2019-05-20 DIAGNOSIS — I10 Essential (primary) hypertension: Secondary | ICD-10-CM | POA: Diagnosis not present

## 2019-05-20 DIAGNOSIS — G620 Drug-induced polyneuropathy: Secondary | ICD-10-CM | POA: Diagnosis not present

## 2019-05-20 DIAGNOSIS — K068 Other specified disorders of gingiva and edentulous alveolar ridge: Secondary | ICD-10-CM | POA: Insufficient documentation

## 2019-05-20 DIAGNOSIS — C3490 Malignant neoplasm of unspecified part of unspecified bronchus or lung: Secondary | ICD-10-CM

## 2019-05-20 LAB — CMP (CANCER CENTER ONLY)
ALT: 21 U/L (ref 0–44)
AST: 29 U/L (ref 15–41)
Albumin: 2.9 g/dL — ABNORMAL LOW (ref 3.5–5.0)
Alkaline Phosphatase: 157 U/L — ABNORMAL HIGH (ref 38–126)
Anion gap: 8 (ref 5–15)
BUN: 12 mg/dL (ref 8–23)
CO2: 27 mmol/L (ref 22–32)
Calcium: 8.8 mg/dL — ABNORMAL LOW (ref 8.9–10.3)
Chloride: 107 mmol/L (ref 98–111)
Creatinine: 0.77 mg/dL (ref 0.44–1.00)
GFR, Est AFR Am: >60 mL/min (ref >60)
GFR, Estimated: >60 mL/min (ref >60)
Glucose, Bld: 147 mg/dL — ABNORMAL HIGH (ref 70–99)
Potassium: 3.4 mmol/L — ABNORMAL LOW (ref 3.5–5.1)
Sodium: 142 mmol/L (ref 135–145)
Total Bilirubin: 0.6 mg/dL (ref 0.3–1.2)
Total Protein: 7 g/dL (ref 6.5–8.1)

## 2019-05-20 LAB — CBC WITH DIFFERENTIAL (CANCER CENTER ONLY)
Abs Immature Granulocytes: 0.04 10*3/uL (ref 0.00–0.07)
Basophils Absolute: 0.1 10*3/uL (ref 0.0–0.1)
Basophils Relative: 1 %
Eosinophils Absolute: 0 10*3/uL (ref 0.0–0.5)
Eosinophils Relative: 1 %
HCT: 35.5 % — ABNORMAL LOW (ref 36.0–46.0)
Hemoglobin: 11.1 g/dL — ABNORMAL LOW (ref 12.0–15.0)
Immature Granulocytes: 1 %
Lymphocytes Relative: 9 %
Lymphs Abs: 0.5 10*3/uL — ABNORMAL LOW (ref 0.7–4.0)
MCH: 27.5 pg (ref 26.0–34.0)
MCHC: 31.3 g/dL (ref 30.0–36.0)
MCV: 87.9 fL (ref 80.0–100.0)
Monocytes Absolute: 0.8 10*3/uL (ref 0.1–1.0)
Monocytes Relative: 14 %
Neutro Abs: 4.5 10*3/uL (ref 1.7–7.7)
Neutrophils Relative %: 74 %
Platelet Count: 261 10*3/uL (ref 150–400)
RBC: 4.04 MIL/uL (ref 3.87–5.11)
RDW: 19.2 % — ABNORMAL HIGH (ref 11.5–15.5)
WBC Count: 6 10*3/uL (ref 4.0–10.5)
nRBC: 0 % (ref 0.0–0.2)

## 2019-05-20 LAB — TSH: TSH: 3.686 u[IU]/mL (ref 0.308–3.960)

## 2019-05-20 NOTE — Progress Notes (Signed)
DISCONTINUE OFF PATHWAY REGIMEN - Non-Small Cell Lung   [id number]:Gemcitabine 1,000 mg/m2 D1, 8  q21 Days:   A cycle is every 21 days:     Gemcitabine   **Always confirm dose/schedule in your pharmacy ordering system**  REASON: Disease Progression PRIOR TREATMENT: Off Pathway: Gemcitabine 1,000 mg/m2 D1, 8  q21 Days TREATMENT RESPONSE: Progressive Disease (PD)  START OFF PATHWAY REGIMEN - Non-Small Cell Lung   [id number]:Nivolumab 240 mg q14 Days:   A cycle is every 14 days:     Nivolumab   **Always confirm dose/schedule in your pharmacy ordering system**  Patient Characteristics: Stage IV Metastatic, Squamous, PS = 0, 1, Third Line, Prior PD-1/PD-L1 Inhibitor or No Prior PD-1/PD-L1 Inhibitor and Not a Candidate for Immunotherapy Therapeutic Status: Stage IV Metastatic Histology: Squamous Cell Line of therapy: Third Engineer, maintenance (IT) Status: 1 PD-L1 Expression Status: Did Not Order Test Immunotherapy Candidate Status: Not a Candidate for Immunotherapy Prior Immunotherapy Status: Prior PD-1/PD-L1 Inhibitor Intent of Therapy: Non-Curative / Palliative Intent, Discussed with Patient

## 2019-05-20 NOTE — Telephone Encounter (Signed)
Scheduled appts per 3/9 los. Gave pt an print out of AVS.

## 2019-05-27 ENCOUNTER — Ambulatory Visit: Payer: Medicare Other

## 2019-05-27 ENCOUNTER — Other Ambulatory Visit: Payer: Medicare Other

## 2019-05-28 ENCOUNTER — Other Ambulatory Visit: Payer: Self-pay

## 2019-05-28 ENCOUNTER — Inpatient Hospital Stay: Payer: Medicare Other

## 2019-05-28 ENCOUNTER — Other Ambulatory Visit: Payer: Self-pay | Admitting: Physician Assistant

## 2019-05-28 DIAGNOSIS — R5383 Other fatigue: Secondary | ICD-10-CM

## 2019-05-28 DIAGNOSIS — R040 Epistaxis: Secondary | ICD-10-CM | POA: Diagnosis not present

## 2019-05-28 DIAGNOSIS — C3491 Malignant neoplasm of unspecified part of right bronchus or lung: Secondary | ICD-10-CM | POA: Diagnosis not present

## 2019-05-28 DIAGNOSIS — K068 Other specified disorders of gingiva and edentulous alveolar ridge: Secondary | ICD-10-CM | POA: Diagnosis not present

## 2019-05-28 DIAGNOSIS — R42 Dizziness and giddiness: Secondary | ICD-10-CM | POA: Diagnosis not present

## 2019-05-28 DIAGNOSIS — E039 Hypothyroidism, unspecified: Secondary | ICD-10-CM | POA: Diagnosis not present

## 2019-05-28 DIAGNOSIS — G620 Drug-induced polyneuropathy: Secondary | ICD-10-CM | POA: Diagnosis not present

## 2019-05-28 LAB — CMP (CANCER CENTER ONLY)
ALT: 13 U/L (ref 0–44)
AST: 27 U/L (ref 15–41)
Albumin: 3 g/dL — ABNORMAL LOW (ref 3.5–5.0)
Alkaline Phosphatase: 143 U/L — ABNORMAL HIGH (ref 38–126)
Anion gap: 9 (ref 5–15)
BUN: 13 mg/dL (ref 8–23)
CO2: 27 mmol/L (ref 22–32)
Calcium: 8.9 mg/dL (ref 8.9–10.3)
Chloride: 104 mmol/L (ref 98–111)
Creatinine: 0.8 mg/dL (ref 0.44–1.00)
GFR, Est AFR Am: >60 mL/min (ref >60)
GFR, Estimated: >60 mL/min (ref >60)
Glucose, Bld: 100 mg/dL — ABNORMAL HIGH (ref 70–99)
Potassium: 3.5 mmol/L (ref 3.5–5.1)
Sodium: 140 mmol/L (ref 135–145)
Total Bilirubin: 0.7 mg/dL (ref 0.3–1.2)
Total Protein: 7.3 g/dL (ref 6.5–8.1)

## 2019-05-28 LAB — TSH: TSH: 7.895 u[IU]/mL — ABNORMAL HIGH (ref 0.308–3.960)

## 2019-05-28 LAB — CBC WITH DIFFERENTIAL (CANCER CENTER ONLY)
Abs Immature Granulocytes: 0.05 10*3/uL (ref 0.00–0.07)
Basophils Absolute: 0.1 10*3/uL (ref 0.0–0.1)
Basophils Relative: 1 %
Eosinophils Absolute: 0.1 10*3/uL (ref 0.0–0.5)
Eosinophils Relative: 1 %
HCT: 38.3 % (ref 36.0–46.0)
Hemoglobin: 12.1 g/dL (ref 12.0–15.0)
Immature Granulocytes: 1 %
Lymphocytes Relative: 11 %
Lymphs Abs: 0.8 10*3/uL (ref 0.7–4.0)
MCH: 27.7 pg (ref 26.0–34.0)
MCHC: 31.6 g/dL (ref 30.0–36.0)
MCV: 87.6 fL (ref 80.0–100.0)
Monocytes Absolute: 1 10*3/uL (ref 0.1–1.0)
Monocytes Relative: 14 %
Neutro Abs: 5.3 10*3/uL (ref 1.7–7.7)
Neutrophils Relative %: 72 %
Platelet Count: 344 10*3/uL (ref 150–400)
RBC: 4.37 MIL/uL (ref 3.87–5.11)
RDW: 18.3 % — ABNORMAL HIGH (ref 11.5–15.5)
WBC Count: 7.3 10*3/uL (ref 4.0–10.5)
nRBC: 0 % (ref 0.0–0.2)

## 2019-05-28 MED ORDER — LEVOTHYROXINE SODIUM 88 MCG PO TABS: 88.0000 ug | ORAL_TABLET | Freq: Every day | ORAL | 2 refills | Status: DC

## 2019-05-28 NOTE — Progress Notes (Signed)
Pt was brought back to the infusion area for her appointment, and has expressed to this writer that she no longer wants any more treatment. She states that she is numb from her feet up to her knees, and her hands up to her wrists, and states "It's just not worth it to be tired and have to lay in bed all the time, and have to go through all this, so I've decided that I just don't want anymore treatment at all". Pt stated that she wished to remain comfortable and pursue palliative care or hospice. Dr. Julien Nordmann made aware of patient decision.

## 2019-05-30 ENCOUNTER — Telehealth: Payer: Self-pay | Admitting: Medical Oncology

## 2019-05-30 NOTE — Telephone Encounter (Signed)
Confirmed appts  for march 30th -she will keep appt.

## 2019-06-10 ENCOUNTER — Inpatient Hospital Stay (HOSPITAL_BASED_OUTPATIENT_CLINIC_OR_DEPARTMENT_OTHER): Payer: Medicare Other | Admitting: Internal Medicine

## 2019-06-10 ENCOUNTER — Inpatient Hospital Stay: Payer: Medicare Other

## 2019-06-10 ENCOUNTER — Other Ambulatory Visit: Payer: Self-pay

## 2019-06-10 ENCOUNTER — Encounter: Payer: Self-pay | Admitting: Internal Medicine

## 2019-06-10 ENCOUNTER — Telehealth: Payer: Self-pay | Admitting: Internal Medicine

## 2019-06-10 VITALS — BP 137/74 | HR 113 | Temp 98.2°F | Resp 20 | Ht 69.0 in | Wt 167.5 lb

## 2019-06-10 DIAGNOSIS — C3490 Malignant neoplasm of unspecified part of unspecified bronchus or lung: Secondary | ICD-10-CM

## 2019-06-10 DIAGNOSIS — C382 Malignant neoplasm of posterior mediastinum: Secondary | ICD-10-CM

## 2019-06-10 DIAGNOSIS — Z7189 Other specified counseling: Secondary | ICD-10-CM | POA: Diagnosis not present

## 2019-06-10 DIAGNOSIS — C3491 Malignant neoplasm of unspecified part of right bronchus or lung: Secondary | ICD-10-CM

## 2019-06-10 DIAGNOSIS — K068 Other specified disorders of gingiva and edentulous alveolar ridge: Secondary | ICD-10-CM | POA: Diagnosis not present

## 2019-06-10 DIAGNOSIS — E039 Hypothyroidism, unspecified: Secondary | ICD-10-CM | POA: Diagnosis not present

## 2019-06-10 DIAGNOSIS — I4891 Unspecified atrial fibrillation: Secondary | ICD-10-CM

## 2019-06-10 DIAGNOSIS — R040 Epistaxis: Secondary | ICD-10-CM | POA: Diagnosis not present

## 2019-06-10 DIAGNOSIS — G620 Drug-induced polyneuropathy: Secondary | ICD-10-CM | POA: Diagnosis not present

## 2019-06-10 DIAGNOSIS — R5383 Other fatigue: Secondary | ICD-10-CM

## 2019-06-10 DIAGNOSIS — R42 Dizziness and giddiness: Secondary | ICD-10-CM | POA: Diagnosis not present

## 2019-06-10 LAB — CMP (CANCER CENTER ONLY)
ALT: 15 U/L (ref 0–44)
AST: 27 U/L (ref 15–41)
Albumin: 2.9 g/dL — ABNORMAL LOW (ref 3.5–5.0)
Alkaline Phosphatase: 130 U/L — ABNORMAL HIGH (ref 38–126)
Anion gap: 10 (ref 5–15)
BUN: 14 mg/dL (ref 8–23)
CO2: 24 mmol/L (ref 22–32)
Calcium: 9.2 mg/dL (ref 8.9–10.3)
Chloride: 107 mmol/L (ref 98–111)
Creatinine: 0.81 mg/dL (ref 0.44–1.00)
GFR, Est AFR Am: >60 mL/min (ref >60)
GFR, Estimated: >60 mL/min (ref >60)
Glucose, Bld: 140 mg/dL — ABNORMAL HIGH (ref 70–99)
Potassium: 3.7 mmol/L (ref 3.5–5.1)
Sodium: 141 mmol/L (ref 135–145)
Total Bilirubin: 0.6 mg/dL (ref 0.3–1.2)
Total Protein: 7.3 g/dL (ref 6.5–8.1)

## 2019-06-10 LAB — CBC WITH DIFFERENTIAL (CANCER CENTER ONLY)
Abs Immature Granulocytes: 0.01 10*3/uL (ref 0.00–0.07)
Basophils Absolute: 0.1 10*3/uL (ref 0.0–0.1)
Basophils Relative: 1 %
Eosinophils Absolute: 0.1 10*3/uL (ref 0.0–0.5)
Eosinophils Relative: 1 %
HCT: 38.1 % (ref 36.0–46.0)
Hemoglobin: 11.9 g/dL — ABNORMAL LOW (ref 12.0–15.0)
Immature Granulocytes: 0 %
Lymphocytes Relative: 14 %
Lymphs Abs: 1 10*3/uL (ref 0.7–4.0)
MCH: 27.1 pg (ref 26.0–34.0)
MCHC: 31.2 g/dL (ref 30.0–36.0)
MCV: 86.8 fL (ref 80.0–100.0)
Monocytes Absolute: 0.6 10*3/uL (ref 0.1–1.0)
Monocytes Relative: 9 %
Neutro Abs: 5.5 10*3/uL (ref 1.7–7.7)
Neutrophils Relative %: 75 %
Platelet Count: 239 10*3/uL (ref 150–400)
RBC: 4.39 MIL/uL (ref 3.87–5.11)
RDW: 16.8 % — ABNORMAL HIGH (ref 11.5–15.5)
WBC Count: 7.3 10*3/uL (ref 4.0–10.5)
nRBC: 0 % (ref 0.0–0.2)

## 2019-06-10 LAB — TSH: TSH: 5.738 u[IU]/mL — ABNORMAL HIGH (ref 0.308–3.960)

## 2019-06-10 NOTE — Telephone Encounter (Signed)
Scheduled appts per 3/30 los. Gave pt a print out of appt calendar.

## 2019-06-10 NOTE — Progress Notes (Signed)
[place] [phone]   [phone]  OFFICE PROGRESS NOTE  Cassidy Panda, MD [address] [place] [address]  DIAGNOSIS: Metastatic non-small cell carcinoma (T3, N2, M1a) non-small cell lung cancer diagnosed in November 2018 and presented with large right suprahilar and mediastinal mass as well as recent right pleural effusion.  Guardant 360: No actionable mutations.  PRIOR THERAPY:  1) Palliative radiotherapy to the large mediastinal mass under the care of Dr. Lisbeth Sanders. 2) Systemic chemotherapy with carboplatin for AUC of 5, paclitaxel 175 mg/M2 and Keytruda 200 mg IV every 3 weeks.  First dose expected May 01, 2017.  Status post 3 cycles. 3) Maintenance treatment with immunotherapy with single agent Keytruda 200 mg IV every 3 weeks.  First cycle 07/24/2017.  Status post 31 cycles.  This was discontinued secondary to disease progression. 4)  Systemic chemotherapy with single agent gemcitabine 1000 mg/M2 on days 1 and 8 every 3 weeks.  First dose April 08, 2019.  Status post 2 cycle.  Starting from cycle #2 I will reduce the dose of gemcitabine to 800 mg/M2 on days 1 and 8 every 3 weeks.  This was discontinued secondary to disease progression.   CURRENT THERAPY: Observation.  INTERVAL HISTORY: Cassidy Sanders 79 y.o. female returns to the clinic today for follow-up visit accompanied by her daughter Cassidy Sanders.  The patient is doing fine today with no concerning complaints except for fatigue and neuropathy in the lower extremities.  She was offered treatment with gabapentin or Lyrica but she declined.  She denied having any chest pain, shortness of breath except with exertion with no cough or hemoptysis.  She denied having any fever or chills.  She has no nausea, vomiting, diarrhea or constipation.  She denied having any headache or visual changes.  She was supposed to start treatment with immunotherapy with nivolumab but the patient decided  against the treatment as she is taking care of her husband with dementia.  She is here today for reevaluation and discussion of her treatment options.  MEDICAL HISTORY: Past Medical History:  Diagnosis Date  . Arrhythmia   . Atrial fibrillation (Cassidy Sanders)   . Brain tumor (benign) (Cassidy Sanders)   . Depression   . Dyspnea    home oxygen  . Dysrhythmia    a fib  . History of home oxygen therapy 02/2017   2 L/minute  . Hypertension     ALLERGIES:  is allergic to latex; gabapentin; and tape.  MEDICATIONS:  Current Outpatient Medications  Medication Sig Dispense Refill  . albuterol (PROVENTIL) (2.5 MG/3ML) 0.083% nebulizer solution VVN Q 4 H PRF WHEEZING OR SHORTNESS OF BREATH 360 mL 2  . Biotin 2.5 MG TABS Take 2,500 mcg by mouth daily.    . clobetasol cream (TEMOVATE) 0.96 % Apply 1 application topically 2 (two) times daily. 60 g 0  . diphenhydrAMINE (BENADRYL) 25 mg capsule Take 1 capsule (25 mg total) by mouth at bedtime as needed for sleep. 30 capsule 0  . furosemide (LASIX) 20 MG tablet TAKE 1 TABLET BY MOUTH DAILY AS NEEDED FOR SWELLING 10 tablet 0  . levalbuterol (XOPENEX) 0.63 MG/3ML nebulizer solution Take 3 mLs (0.63 mg total) by nebulization every 6 (six) hours as needed for wheezing or shortness of breath. 3 mL 12  . levothyroxine (SYNTHROID) 88 MCG tablet Take 1 tablet (88 mcg total) by mouth daily before breakfast. 30 tablet 2  . metoprolol tartrate (LOPRESSOR) 25 MG tablet Take 25 mg by  mouth daily.     . predniSONE (DELTASONE) 5 MG tablet 6 tab x 1 day, 5 tab x 1 day, 4 tab x 1 day, 3 tab x 1 day, 2 tab x 1 day, 1 tab x 1 day, stop 21 tablet 0  . prochlorperazine (COMPAZINE) 10 MG tablet TAKE 1 TABLET(10 MG) BY MOUTH EVERY 6 HOURS AS NEEDED FOR NAUSEA OR VOMITING 30 tablet 0  . Respiratory Therapy Supplies (FLUTTER) DEVI Use as directed. 1 each 0  . sertraline (ZOLOFT) 50 MG tablet Take 50 mg by mouth daily.     No current facility-administered medications for this visit.     SURGICAL HISTORY:  Past Surgical History:  Procedure Laterality Date  . ABDOMINAL HYSTERECTOMY    . BLADDER SURGERY    . BRAIN SURGERY    . ENDOBRONCHIAL ULTRASOUND Bilateral 02/06/2017   Procedure: ENDOBRONCHIAL ULTRASOUND;  Surgeon: Cassidy Doom, MD;  Location: [facility];  Service: Cardiopulmonary;  Laterality: Bilateral;  . IR GUIDED DRAIN W CATHETER PLACEMENT  06/29/2017  . IR REMOVAL OF PLURAL CATH W/CUFF  08/28/2017  . IR US GUIDE BX ASP/DRAIN  06/29/2017  . VIDEO BRONCHOSCOPY WITH ENDOBRONCHIAL ULTRASOUND N/A 03/23/2017   Procedure: VIDEO BRONCHOSCOPY WITH ENDOBRONCHIAL ULTRASOUND;  Surgeon: Cassidy Doom, MD;  Location: [facility];  Service: Thoracic;  Laterality: N/A;    REVIEW OF SYSTEMS:  Constitutional: positive for fatigue Eyes: negative Ears, nose, mouth, throat, and face: negative Respiratory: positive for dyspnea on exertion Cardiovascular: negative Gastrointestinal: negative Genitourinary:negative Integument/breast: negative Hematologic/lymphatic: negative Musculoskeletal:negative Neurological: positive for paresthesia Behavioral/Psych: negative Endocrine: negative Allergic/Immunologic: negative   PHYSICAL EXAMINATION: General appearance: alert, cooperative, fatigued and no distress Head: Normocephalic, without obvious abnormality, atraumatic Neck: no adenopathy, no JVD, supple, symmetrical, trachea midline and thyroid not enlarged, symmetric, no tenderness/mass/nodules Lymph nodes: Cervical, supraclavicular, and axillary nodes normal. Resp: clear to auscultation bilaterally Back: symmetric, no curvature. ROM normal. No CVA tenderness. Cardio: regular rate and rhythm, S1, S2 normal, no murmur, click, rub or gallop GI: soft, non-tender; bowel sounds normal; no masses,  no organomegaly Extremities: edema Trace edema bilateral  ECOG PERFORMANCE STATUS: 1 - Symptomatic but completely ambulatory  There were no vitals taken for this visit.  LABORATORY  DATA: Lab Results  Component Value Date   WBC 7.3 06/10/2019   HGB 11.9 (L) 06/10/2019   HCT 38.1 06/10/2019   MCV 86.8 06/10/2019   PLT 239 06/10/2019      Chemistry      Component Value Date/Time   NA 140 05/28/2019 1054   NA 143 03/15/2017 1436   K 3.5 05/28/2019 1054   K 3.7 03/15/2017 1436   CL 104 05/28/2019 1054   CO2 27 05/28/2019 1054   CO2 34 (H) 03/15/2017 1436   BUN 13 05/28/2019 1054   BUN 17.8 03/15/2017 1436   CREATININE 0.80 05/28/2019 1054   CREATININE 0.8 03/15/2017 1436      Component Value Date/Time   CALCIUM 8.9 05/28/2019 1054   CALCIUM 9.6 03/15/2017 1436   ALKPHOS 143 (H) 05/28/2019 1054   ALKPHOS 88 03/15/2017 1436   AST 27 05/28/2019 1054   AST 16 03/15/2017 1436   ALT 13 05/28/2019 1054   ALT 15 03/15/2017 1436   BILITOT 0.7 05/28/2019 1054   BILITOT 0.52 03/15/2017 1436       RADIOGRAPHIC STUDIES: CT Chest W Contrast  Result Date: 05/19/2019 CLINICAL DATA:  Stage IV right lung non-small cell carcinoma diagnosed November 2018 status post chemoradiation therapy and  maintenance immunotherapy, with interval systemic chemotherapy due to disease progression. Restaging. EXAM: CT CHEST, ABDOMEN, AND PELVIS WITH CONTRAST TECHNIQUE: Multidetector CT imaging of the chest, abdomen and pelvis was performed following the standard protocol during bolus administration of intravenous contrast. CONTRAST:  174mL OMNIPAQUE IOHEXOL 300 MG/ML  SOLN COMPARISON:  02/28/2019 CT chest, abdomen and pelvis. FINDINGS: CT CHEST FINDINGS Cardiovascular: Top-normal heart size. No significant pericardial effusion/thickening. Three-vessel coronary atherosclerosis. Atherosclerotic nonaneurysmal thoracic aorta. Normal caliber pulmonary arteries. No central pulmonary emboli. Chronic narrowing of mid SVC, not appreciably changed. Mediastinum/Nodes: Stable heterogeneous hypodense 1.7 cm posterior right thyroid nodule. No acute esophageal abnormality. No axillary adenopathy. Enlarged  1.9 cm left supraclavicular node (series 2/image 80), previously 1.7 cm using similar measurement technique, slightly increased. Low right paratracheal soft tissue measures up to the 1.4 cm short axis diameter, stable (series 2/image 22). No new pathologically enlarged mediastinal nodes. No left hilar adenopathy. Stable right perihilar soft tissue. Lungs/Pleura: No pneumothorax. Loculated small posterior upper right pleural effusion with associated pleural thickening, unchanged. Trace loculated basilar right pleural effusion is slightly decreased. No left pleural effusion. Sharply marginated dense right perihilar lung consolidation with significant volume loss, distortion and bronchiectasis, unchanged, compatible with radiation fibrosis. Several (at least 10) solid pulmonary nodules scattered throughout the left lung, with variable mild interval changes. For example a 1.1 cm peripheral left lower lobe nodule (series 6/image 97), previously 0.9 cm, mildly increased. Separate peripheral left lower lobe 1.1 cm nodule (series 6/image 86), previously 1.0 cm, minimally increased. Superior segment left lower lobe 0.6 cm nodule (series 6/image 37), previously 0.7 cm, minimally decreased. Anterior left upper lobe 1.1 cm nodule (series 6/image 56), previously 0.9 cm, slightly increased. No new significant pulmonary nodules. Musculoskeletal: No aggressive appearing focal osseous lesions. Chronic moderate T12 vertebral compression fracture is unchanged. Moderate thoracic spondylosis. CT ABDOMEN PELVIS FINDINGS Hepatobiliary: Normal liver with no liver mass. Cholelithiasis. Possible 2 mm choledocholith in CBD (series 2/image 63), similar to prior CT. No biliary ductal dilatation. CBD diameter 4 mm. Pancreas: Normal, with no mass or duct dilation. Spleen: Normal size. No mass. Adrenals/Urinary Tract: Right adrenal 1.7 cm nodule with density 18 HU, stable size. Left adrenal 2.0 cm adenoma with density -6 HU, stable. No  hydronephrosis. Contrast reflux from the hemi azygous system into left renal and left gonadal veins. Scattered subcentimeter hypodense renal cortical lesions in both kidneys are too small to characterize and appear unchanged. Small cystocele. Otherwise normal bladder. Stomach/Bowel: Normal non-distended stomach. Normal caliber small bowel with no small bowel wall thickening. Normal appendix. Oral contrast transits to the colon. Mild scattered left colonic diverticulosis, with no large bowel wall thickening or acute pericolonic fat stranding. Vascular/Lymphatic: Atherosclerotic abdominal aorta with stable 3.1 cm infrarenal abdominal aortic aneurysm. Patent portal, splenic, hepatic and renal veins. No pathologically enlarged lymph nodes in the abdomen or pelvis. Reproductive: Status post hysterectomy, with no abnormal findings at the vaginal cuff. No adnexal mass. Other: No pneumoperitoneum, ascites or focal fluid collection. Musculoskeletal: No aggressive appearing focal osseous lesions. Marked lumbar spondylosis. IMPRESSION: 1. Left supraclavicular adenopathy is slightly increased. 2. Mild mixed changes involving the left pulmonary metastases, a few of which have mildly increased in size as detailed. 3. Stable extensive post treatment changes in the right hemithorax. 4. No evidence of metastatic disease in the abdomen or pelvis. 5. Chronic findings include: Aortic Atherosclerosis (ICD10-I70.0). Three-vessel coronary atherosclerosis. Stable 3.1 cm infrarenal abdominal aortic aneurysm. Cholelithiasis. Possible 2 mm choledocholith in the CBD. No biliary ductal dilatation.  Stable bilateral adrenal nodules, favor adenomas. Small cystocele. Mild left colonic diverticulosis. Electronically Signed   By: Ilona Sorrel M.D.   On: 05/19/2019 15:14   CT Abdomen Pelvis W Contrast  Result Date: 05/19/2019 CLINICAL DATA:  Stage IV right lung non-small cell carcinoma diagnosed November 2018 status post chemoradiation therapy and  maintenance immunotherapy, with interval systemic chemotherapy due to disease progression. Restaging. EXAM: CT CHEST, ABDOMEN, AND PELVIS WITH CONTRAST TECHNIQUE: Multidetector CT imaging of the chest, abdomen and pelvis was performed following the standard protocol during bolus administration of intravenous contrast. CONTRAST:  18mL OMNIPAQUE IOHEXOL 300 MG/ML  SOLN COMPARISON:  02/28/2019 CT chest, abdomen and pelvis. FINDINGS: CT CHEST FINDINGS Cardiovascular: Top-normal heart size. No significant pericardial effusion/thickening. Three-vessel coronary atherosclerosis. Atherosclerotic nonaneurysmal thoracic aorta. Normal caliber pulmonary arteries. No central pulmonary emboli. Chronic narrowing of mid SVC, not appreciably changed. Mediastinum/Nodes: Stable heterogeneous hypodense 1.7 cm posterior right thyroid nodule. No acute esophageal abnormality. No axillary adenopathy. Enlarged 1.9 cm left supraclavicular node (series 2/image 80), previously 1.7 cm using similar measurement technique, slightly increased. Low right paratracheal soft tissue measures up to the 1.4 cm short axis diameter, stable (series 2/image 22). No new pathologically enlarged mediastinal nodes. No left hilar adenopathy. Stable right perihilar soft tissue. Lungs/Pleura: No pneumothorax. Loculated small posterior upper right pleural effusion with associated pleural thickening, unchanged. Trace loculated basilar right pleural effusion is slightly decreased. No left pleural effusion. Sharply marginated dense right perihilar lung consolidation with significant volume loss, distortion and bronchiectasis, unchanged, compatible with radiation fibrosis. Several (at least 10) solid pulmonary nodules scattered throughout the left lung, with variable mild interval changes. For example a 1.1 cm peripheral left lower lobe nodule (series 6/image 97), previously 0.9 cm, mildly increased. Separate peripheral left lower lobe 1.1 cm nodule (series 6/image 86),  previously 1.0 cm, minimally increased. Superior segment left lower lobe 0.6 cm nodule (series 6/image 37), previously 0.7 cm, minimally decreased. Anterior left upper lobe 1.1 cm nodule (series 6/image 56), previously 0.9 cm, slightly increased. No new significant pulmonary nodules. Musculoskeletal: No aggressive appearing focal osseous lesions. Chronic moderate T12 vertebral compression fracture is unchanged. Moderate thoracic spondylosis. CT ABDOMEN PELVIS FINDINGS Hepatobiliary: Normal liver with no liver mass. Cholelithiasis. Possible 2 mm choledocholith in CBD (series 2/image 63), similar to prior CT. No biliary ductal dilatation. CBD diameter 4 mm. Pancreas: Normal, with no mass or duct dilation. Spleen: Normal size. No mass. Adrenals/Urinary Tract: Right adrenal 1.7 cm nodule with density 18 HU, stable size. Left adrenal 2.0 cm adenoma with density -6 HU, stable. No hydronephrosis. Contrast reflux from the hemi azygous system into left renal and left gonadal veins. Scattered subcentimeter hypodense renal cortical lesions in both kidneys are too small to characterize and appear unchanged. Small cystocele. Otherwise normal bladder. Stomach/Bowel: Normal non-distended stomach. Normal caliber small bowel with no small bowel wall thickening. Normal appendix. Oral contrast transits to the colon. Mild scattered left colonic diverticulosis, with no large bowel wall thickening or acute pericolonic fat stranding. Vascular/Lymphatic: Atherosclerotic abdominal aorta with stable 3.1 cm infrarenal abdominal aortic aneurysm. Patent portal, splenic, hepatic and renal veins. No pathologically enlarged lymph nodes in the abdomen or pelvis. Reproductive: Status post hysterectomy, with no abnormal findings at the vaginal cuff. No adnexal mass. Other: No pneumoperitoneum, ascites or focal fluid collection. Musculoskeletal: No aggressive appearing focal osseous lesions. Marked lumbar spondylosis. IMPRESSION: 1. Left  supraclavicular adenopathy is slightly increased. 2. Mild mixed changes involving the left pulmonary metastases, a few of which have  mildly increased in size as detailed. 3. Stable extensive post treatment changes in the right hemithorax. 4. No evidence of metastatic disease in the abdomen or pelvis. 5. Chronic findings include: Aortic Atherosclerosis (ICD10-I70.0). Three-vessel coronary atherosclerosis. Stable 3.1 cm infrarenal abdominal aortic aneurysm. Cholelithiasis. Possible 2 mm choledocholith in the CBD. No biliary ductal dilatation. Stable bilateral adrenal nodules, favor adenomas. Small cystocele. Mild left colonic diverticulosis. Electronically Signed   By: Ilona Sorrel M.D.   On: 05/19/2019 15:14    ASSESSMENT AND PLAN: This is a very pleasant 79 years old white female recently diagnosed with probably stage IV non-small cell lung cancer presented with large right suprahilar lung mass in addition to mediastinal lymphadenopathy and suspicious malignant right pleural effusion.  The patient underwent systemic chemotherapy with carboplatin, paclitaxel and Keytruda status post 3 cycles.  She was tolerating this treatment well with no concerning complaints except for the last cycle when she was admitted to the hospital with C. difficile as well as dehydration and renal insufficiency.   She started treatment with single agent Ketruda (pembrolizumab) status post 31 cycles.  This treatment was discontinued secondary to slow but persistent disease progression She was also treated with second line chemotherapy with single agent gemcitabine on days 1 and 8 initiated with a standard dose of 1000 mg/M2 for cycle 1 and then reduced to 800 mg/M2 on cycle 2 but the patient has a rough time with her treatment.  She also had evidence for disease progression after cycle #2. The patient was expected to start treatment with immunotherapy with nivolumab but she declined this option because she has to take care of her  husband who is suffering from dementia. I had a lengthy discussion with the patient and her daughter today about her current condition and treatment options.  The patient is not interested in any treatment at this point.  I discussed with her the option of continuous observation and monitoring with repeat imaging studies every 3 months to check the status of her disease and consider her for future treatment if she changes her mind versus referral to palliative care and hospice.  The patient would like to continue on observation for now.  She is not interested in proceeding with the hospice service at this point. I will see her back for follow-up visit in 3 months for evaluation with repeat CT scan of the chest, abdomen pelvis for restaging of her disease. For the peripheral neuropathy, I offered her treatment with gabapentin or Lyrica but the patient declined. For the hypothyroidism, we will continue to monitor her TSH with the future blood work. She was advised to call immediately if she has any concerning symptoms in the interval.  The patient voices understanding of current disease status and treatment options and is in agreement with the current care plan. All questions were answered. The patient knows to call the clinic with any problems, questions or concerns. We can certainly see the patient much sooner if necessary.  Disclaimer: This note was dictated with voice recognition software. Similar sounding words can inadvertently be transcribed and may not be corrected upon review.

## 2019-06-24 ENCOUNTER — Ambulatory Visit: Payer: Medicare Other

## 2019-06-24 ENCOUNTER — Other Ambulatory Visit: Payer: Medicare Other

## 2019-07-30 ENCOUNTER — Other Ambulatory Visit: Payer: Self-pay | Admitting: Medical

## 2019-07-30 DIAGNOSIS — L299 Pruritus, unspecified: Secondary | ICD-10-CM

## 2019-09-04 ENCOUNTER — Other Ambulatory Visit: Payer: Self-pay | Admitting: *Deleted

## 2019-09-04 DIAGNOSIS — E039 Hypothyroidism, unspecified: Secondary | ICD-10-CM

## 2019-09-04 MED ORDER — LEVOTHYROXINE SODIUM 88 MCG PO TABS: 88.0000 ug | ORAL_TABLET | Freq: Every day | ORAL | 2 refills | Status: AC

## 2019-09-08 ENCOUNTER — Other Ambulatory Visit: Payer: Self-pay

## 2019-09-08 ENCOUNTER — Ambulatory Visit (HOSPITAL_COMMUNITY)
Admission: RE | Admit: 2019-09-08 | Discharge: 2019-09-08 | Disposition: A | Discharge status: Home / Self Care | Payer: Medicare Other | Source: Ambulatory Visit | Attending: Internal Medicine | Admitting: Internal Medicine

## 2019-09-08 ENCOUNTER — Encounter (HOSPITAL_COMMUNITY): Payer: Self-pay

## 2019-09-08 ENCOUNTER — Inpatient Hospital Stay: Payer: Medicare Other | Attending: Internal Medicine

## 2019-09-08 DIAGNOSIS — E039 Hypothyroidism, unspecified: Secondary | ICD-10-CM | POA: Diagnosis not present

## 2019-09-08 DIAGNOSIS — R0609 Other forms of dyspnea: Secondary | ICD-10-CM | POA: Diagnosis not present

## 2019-09-08 DIAGNOSIS — Z79899 Other long term (current) drug therapy: Secondary | ICD-10-CM | POA: Insufficient documentation

## 2019-09-08 DIAGNOSIS — C3491 Malignant neoplasm of unspecified part of right bronchus or lung: Secondary | ICD-10-CM | POA: Insufficient documentation

## 2019-09-08 DIAGNOSIS — C3490 Malignant neoplasm of unspecified part of unspecified bronchus or lung: Secondary | ICD-10-CM | POA: Diagnosis not present

## 2019-09-08 DIAGNOSIS — R5383 Other fatigue: Secondary | ICD-10-CM | POA: Diagnosis not present

## 2019-09-08 LAB — CMP (CANCER CENTER ONLY)
ALT: 22 U/L (ref 0–44)
AST: 32 U/L (ref 15–41)
Albumin: 3.2 g/dL — ABNORMAL LOW (ref 3.5–5.0)
Alkaline Phosphatase: 154 U/L — ABNORMAL HIGH (ref 38–126)
Anion gap: 13 (ref 5–15)
BUN: 13 mg/dL (ref 8–23)
CO2: 25 mmol/L (ref 22–32)
Calcium: 9.4 mg/dL (ref 8.9–10.3)
Chloride: 103 mmol/L (ref 98–111)
Creatinine: 0.78 mg/dL (ref 0.44–1.00)
GFR, Est AFR Am: >60 mL/min (ref >60)
GFR, Estimated: >60 mL/min (ref >60)
Glucose, Bld: 116 mg/dL — ABNORMAL HIGH (ref 70–99)
Potassium: 4 mmol/L (ref 3.5–5.1)
Sodium: 141 mmol/L (ref 135–145)
Total Bilirubin: 0.8 mg/dL (ref 0.3–1.2)
Total Protein: 8.1 g/dL (ref 6.5–8.1)

## 2019-09-08 LAB — CBC WITH DIFFERENTIAL (CANCER CENTER ONLY)
Abs Immature Granulocytes: 0.02 10*3/uL (ref 0.00–0.07)
Basophils Absolute: 0 10*3/uL (ref 0.0–0.1)
Basophils Relative: 0 %
Eosinophils Absolute: 0 10*3/uL (ref 0.0–0.5)
Eosinophils Relative: 0 %
HCT: 43.2 % (ref 36.0–46.0)
Hemoglobin: 13.8 g/dL (ref 12.0–15.0)
Immature Granulocytes: 0 %
Lymphocytes Relative: 10 %
Lymphs Abs: 0.8 10*3/uL (ref 0.7–4.0)
MCH: 26.7 pg (ref 26.0–34.0)
MCHC: 31.9 g/dL (ref 30.0–36.0)
MCV: 83.7 fL (ref 80.0–100.0)
Monocytes Absolute: 1 10*3/uL (ref 0.1–1.0)
Monocytes Relative: 12 %
Neutro Abs: 6.7 10*3/uL (ref 1.7–7.7)
Neutrophils Relative %: 78 %
Platelet Count: 198 10*3/uL (ref 150–400)
RBC: 5.16 MIL/uL — ABNORMAL HIGH (ref 3.87–5.11)
RDW: 14.5 % (ref 11.5–15.5)
WBC Count: 8.6 10*3/uL (ref 4.0–10.5)
nRBC: 0 % (ref 0.0–0.2)

## 2019-09-08 LAB — TSH: TSH: 1.348 u[IU]/mL (ref 0.308–3.960)

## 2019-09-08 MED ORDER — SODIUM CHLORIDE (PF) 0.9 % IJ SOLN
INTRAMUSCULAR | Status: AC
  Filled 2019-09-08: qty 50

## 2019-09-08 MED ORDER — IOHEXOL 300 MG/ML  SOLN
100.0000 mL | Freq: Once | INTRAMUSCULAR | Status: AC | PRN
  Administered 2019-09-08: 100 mL via INTRAVENOUS

## 2019-09-10 ENCOUNTER — Encounter: Payer: Self-pay | Admitting: Internal Medicine

## 2019-09-10 ENCOUNTER — Inpatient Hospital Stay (HOSPITAL_BASED_OUTPATIENT_CLINIC_OR_DEPARTMENT_OTHER): Payer: Medicare Other | Admitting: Internal Medicine

## 2019-09-10 ENCOUNTER — Other Ambulatory Visit: Payer: Self-pay

## 2019-09-10 ENCOUNTER — Telehealth: Payer: Self-pay | Admitting: Medical Oncology

## 2019-09-10 VITALS — BP 110/76 | HR 95 | Temp 97.7°F | Resp 20 | Ht 69.0 in | Wt 168.4 lb

## 2019-09-10 DIAGNOSIS — I10 Essential (primary) hypertension: Secondary | ICD-10-CM

## 2019-09-10 DIAGNOSIS — Z7189 Other specified counseling: Secondary | ICD-10-CM | POA: Diagnosis not present

## 2019-09-10 DIAGNOSIS — C3491 Malignant neoplasm of unspecified part of right bronchus or lung: Secondary | ICD-10-CM | POA: Diagnosis not present

## 2019-09-10 DIAGNOSIS — C382 Malignant neoplasm of posterior mediastinum: Secondary | ICD-10-CM

## 2019-09-10 NOTE — Progress Notes (Signed)
[place] [phone]   [phone]  OFFICE PROGRESS NOTE  [name], MD [address] [place] [address]  DIAGNOSIS: Metastatic non-small cell carcinoma (T3, N2, M1a) non-small cell lung cancer diagnosed in November 2018 and presented with large right suprahilar and mediastinal mass as well as recent right pleural effusion.  Guardant 360: No actionable mutations.  PRIOR THERAPY:  1) Palliative radiotherapy to the large mediastinal mass under the care of Dr. Lisbeth Renshaw. 2) Systemic chemotherapy with carboplatin for AUC of 5, paclitaxel 175 mg/M2 and Keytruda 200 mg IV every 3 weeks.  First dose expected May 01, 2017.  Status post 3 cycles. 3) Maintenance treatment with immunotherapy with single agent Keytruda 200 mg IV every 3 weeks.  First cycle 07/24/2017.  Status post 31 cycles.  This was discontinued secondary to disease progression. 4)  Systemic chemotherapy with single agent gemcitabine 1000 mg/M2 on days 1 and 8 every 3 weeks.  First dose April 08, 2019.  Status post 2 cycle.  Starting from cycle #2 I will reduce the dose of gemcitabine to 800 mg/M2 on days 1 and 8 every 3 weeks.  This was discontinued secondary to disease progression.   CURRENT THERAPY: Palliative and hospice care.  INTERVAL HISTORY: Cassidy Sanders 79 y.o. female returns to the clinic today for follow-up visit accompanied by her daughter Cassidy Sanders.  The patient is feeling fine today with no concerning complaints except for the persistent fatigue and shortness of breath with exertion.  She denied having any current chest pain, cough or hemoptysis.  She denied having any fever or chills.  She has no nausea, vomiting, diarrhea or constipation.  She has no headache or visual changes.  She is taking care of her husband who has dementia.  The patient has been in observation for the last few months.  She had repeat CT scan of the chest, abdomen and pelvis performed recently  and she is here today for evaluation and discussion of her risk her results.  MEDICAL HISTORY: Past Medical History:  Diagnosis Date  . Arrhythmia   . Atrial fibrillation ([place])   . Brain tumor (benign) ([place])   . Depression   . Dyspnea    home oxygen  . Dysrhythmia    a fib  . History of home oxygen therapy 02/2017   2 L/minute  . Hypertension     ALLERGIES:  is allergic to latex, gabapentin, and tape.  MEDICATIONS:  Current Outpatient Medications  Medication Sig Dispense Refill  . albuterol (PROVENTIL) (2.5 MG/3ML) 0.083% nebulizer solution VVN Q 4 H PRF WHEEZING OR SHORTNESS OF BREATH 360 mL 2  . Biotin 2.5 MG TABS Take 2,500 mcg by mouth daily.    . clobetasol cream (TEMOVATE) 7.61 % Apply 1 application topically 2 (two) times daily. 60 g 0  . diphenhydrAMINE (BENADRYL) 25 mg capsule Take 1 capsule (25 mg total) by mouth at bedtime as needed for sleep. 30 capsule 0  . furosemide (LASIX) 20 MG tablet TAKE 1 TABLET BY MOUTH DAILY AS NEEDED FOR SWELLING 10 tablet 0  . levalbuterol (XOPENEX) 0.63 MG/3ML nebulizer solution Take 3 mLs (0.63 mg total) by nebulization every 6 (six) hours as needed for wheezing or shortness of breath. 3 mL 12  . levothyroxine (SYNTHROID) 88 MCG tablet Take 1 tablet (88 mcg total) by mouth daily before breakfast. 30 tablet 2  . metoprolol tartrate (LOPRESSOR) 25 MG tablet Take 25 mg by mouth daily.     Marland Kitchen  mupirocin ointment (BACTROBAN) 2 %     . prochlorperazine (COMPAZINE) 10 MG tablet TAKE 1 TABLET(10 MG) BY MOUTH EVERY 6 HOURS AS NEEDED FOR NAUSEA OR VOMITING 30 tablet 0  . Respiratory Therapy Supplies (FLUTTER) DEVI Use as directed. 1 each 0  . sertraline (ZOLOFT) 50 MG tablet Take 50 mg by mouth daily.     No current facility-administered medications for this visit.    SURGICAL HISTORY:  Past Surgical History:  Procedure Laterality Date  . ABDOMINAL HYSTERECTOMY    . BLADDER SURGERY    . BRAIN SURGERY    . ENDOBRONCHIAL ULTRASOUND Bilateral  02/06/2017   Procedure: ENDOBRONCHIAL ULTRASOUND;  Surgeon: Juanito Doom, MD;  Location: [facility];  Service: Cardiopulmonary;  Laterality: Bilateral;  . IR GUIDED DRAIN W CATHETER PLACEMENT  06/29/2017  . IR REMOVAL OF PLURAL CATH W/CUFF  08/28/2017  . IR US GUIDE BX ASP/DRAIN  06/29/2017  . VIDEO BRONCHOSCOPY WITH ENDOBRONCHIAL ULTRASOUND N/A 03/23/2017   Procedure: VIDEO BRONCHOSCOPY WITH ENDOBRONCHIAL ULTRASOUND;  Surgeon: Juanito Doom, MD;  Location: [facility];  Service: Thoracic;  Laterality: N/A;    REVIEW OF SYSTEMS:  Constitutional: positive for fatigue Eyes: negative Ears, nose, mouth, throat, and face: negative Respiratory: positive for dyspnea on exertion Cardiovascular: negative Gastrointestinal: negative Genitourinary:negative Integument/breast: negative Hematologic/lymphatic: negative Musculoskeletal:negative Neurological: positive for paresthesia Behavioral/Psych: negative Endocrine: negative Allergic/Immunologic: negative   PHYSICAL EXAMINATION: General appearance: alert, cooperative, fatigued and no distress Head: Normocephalic, without obvious abnormality, atraumatic Neck: no adenopathy, no JVD, supple, symmetrical, trachea midline and thyroid not enlarged, symmetric, no tenderness/mass/nodules Lymph nodes: Cervical, supraclavicular, and axillary nodes normal. Resp: clear to auscultation bilaterally Back: symmetric, no curvature. ROM normal. No CVA tenderness. Cardio: regular rate and rhythm, S1, S2 normal, no murmur, click, rub or gallop GI: soft, non-tender; bowel sounds normal; no masses,  no organomegaly Extremities: edema Trace edema bilateral Neurologic: Alert and oriented X 3, normal strength and tone. Normal symmetric reflexes. Normal coordination and gait  ECOG PERFORMANCE STATUS: 1 - Symptomatic but completely ambulatory  There were no vitals taken for this visit.  LABORATORY DATA: Lab Results  Component Value Date   WBC 8.6 09/08/2019    HGB 13.8 09/08/2019   HCT 43.2 09/08/2019   MCV 83.7 09/08/2019   PLT 198 09/08/2019      Chemistry      Component Value Date/Time   NA 141 09/08/2019 0902   NA 143 03/15/2017 1436   K 4.0 09/08/2019 0902   K 3.7 03/15/2017 1436   CL 103 09/08/2019 0902   CO2 25 09/08/2019 0902   CO2 34 (H) 03/15/2017 1436   BUN 13 09/08/2019 0902   BUN 17.8 03/15/2017 1436   CREATININE 0.78 09/08/2019 0902   CREATININE 0.8 03/15/2017 1436      Component Value Date/Time   CALCIUM 9.4 09/08/2019 0902   CALCIUM 9.6 03/15/2017 1436   ALKPHOS 154 (H) 09/08/2019 0902   ALKPHOS 88 03/15/2017 1436   AST 32 09/08/2019 0902   AST 16 03/15/2017 1436   ALT 22 09/08/2019 0902   ALT 15 03/15/2017 1436   BILITOT 0.8 09/08/2019 0902   BILITOT 0.52 03/15/2017 1436       RADIOGRAPHIC STUDIES: CT Chest W Contrast  Result Date: 09/08/2019 CLINICAL DATA:  Non-small cell lung cancer.  Restaging. EXAM: CT CHEST, ABDOMEN, AND PELVIS WITH CONTRAST TECHNIQUE: Multidetector CT imaging of the chest, abdomen and pelvis was performed following the standard protocol during bolus administration of intravenous contrast. CONTRAST:  117mL OMNIPAQUE IOHEXOL 300 MG/ML  SOLN COMPARISON:  05/19/2019 FINDINGS: CT CHEST FINDINGS Cardiovascular: Normal heart size. Aortic atherosclerosis. Left main, lad, left circumflex and RCA coronary artery calcifications noted. No significant pericardial effusion. Mediastinum/Nodes: Unchanged appearance of right posterior thyroid nodule measuring 1.6 cm. In the setting of significant comorbidities or limited life expectancy, no follow-up recommended (ref: J Am Coll Radiol. 2015 Feb;12(2): 143-50). The trachea appears patent and is midline. Normal appearance of the esophagus. Left supraclavicular lymph node is enlarged measuring 2.1 cm, image 12/2. Previously 2.0 cm. The low right paratracheal soft tissue measures 3.6 x 2.3 cm, image 26/2. Previously 1.9 x 1.8 cm. Lungs/Pleura: Loculated pleural  fluid overlying the right lower lobe is again noted and appears mildly increased in volume in the interval. Similar appearance of focal area of loculated pleural fluid with overlying pleural thickening within the right upper chest. Today this measures 6.4 x 3.8 cm, image 25/2. Previously 6.1 x 3.9 cm. Extensive post treatment changes involving the right upper lobe with diffuse consolidation the and fibrosis. Multiple scattered lung nodules are identified bilaterally. Index nodule within the anterior left upper lobe measures 1.1 cm, image 74/6. Previously 0 point 8 mm. Index nodule within the lingula measures 1.3 x 0.8 cm, image 82/6. Previously 0.9 x 0.6 cm. Index nodule in the posterior left lower lung measures 1.5 x 1.3 cm, image 97/6. Previously 1.1 x 0.9 cm. Adjacent lung nodule measures 1.3 x 1.0 cm, image 98/6. Previously 1.0 x 0.8 cm. Lung nodule within the right lower lobe measures 0.9 x 0.6 cm, image 113/6. Previously 0.7 x 0.5 cm. Musculoskeletal: No aggressive lytic or sclerotic bone lesions. Superior endplate fracture involving the T12 vertebra is unchanged when compared with the previous exam. No aggressive lytic or sclerotic bone lesions identified. CT ABDOMEN PELVIS FINDINGS Hepatobiliary: No focal liver abnormality is seen. Numerous tiny stones are noted within the dependent portion of the gallbladder. No gallbladder wall inflammation or bile duct dilatation. Pancreas: Unremarkable. No pancreatic ductal dilatation or surrounding inflammatory changes. Spleen: Normal in size without focal abnormality. Adrenals/Urinary Tract: Bilateral adrenal nodules are again noted. The right adrenal nodule measures 1.6 by 1.2 cm, image 58/2. Unchanged. 1.9 x 1.9 cm left adrenal nodule is also unchanged, image 59/2. New focal hypoenhancing abnormality within the posterolateral cortex of the upper pole of left kidney measures 1.4 x 1.9 cm. Similar appearance of small, less than 1 cm low-density foci within the right  kidney. Urinary bladder unremarkable. Stomach/Bowel: Stomach is within normal limits. Appendix appears normal. No evidence of bowel wall thickening, distention, or inflammatory changes. Vascular/Lymphatic: Aortic atherosclerosis. Infrarenal abdominal aorta measures 2.8 cm, image 70/2. No upper abdominal adenopathy identified. Reproductive: Status post hysterectomy. No adnexal masses. Other: No free fluid or fluid collection. Musculoskeletal: Spondylosis identified within the lumbar spine. No acute or aggressive osseous abnormalities. IMPRESSION: 1. Interval progression of disease. 2. Increase in size of left supraclavicular lymph node and infiltrative soft tissue within the low right paratracheal nodal chain. 3. Multiple pulmonary nodules identified within the left lung and right lower lobe have increased in size when compared with the previous exam. 4. Similar appearance of extensive post treatment changes involving the right mid and right upper lung. 5. New focal hypoenhancing abnormality within the posterolateral cortex of the upper pole of left kidney is identified. Although this is a nonspecific finding this may represent either focal pyelonephritis or metastatic disease. 6. Aortic atherosclerosis. Left main and 3 vessel coronary artery calcifications noted. 7. Gallstones. Aortic  Atherosclerosis (ICD10-I70.0). Electronically Signed   By: Kerby Moors M.D.   On: 09/08/2019 12:01   CT Abdomen Pelvis W Contrast  Result Date: 09/08/2019 CLINICAL DATA:  Non-small cell lung cancer.  Restaging. EXAM: CT CHEST, ABDOMEN, AND PELVIS WITH CONTRAST TECHNIQUE: Multidetector CT imaging of the chest, abdomen and pelvis was performed following the standard protocol during bolus administration of intravenous contrast. CONTRAST:  129mL OMNIPAQUE IOHEXOL 300 MG/ML  SOLN COMPARISON:  05/19/2019 FINDINGS: CT CHEST FINDINGS Cardiovascular: Normal heart size. Aortic atherosclerosis. Left main, lad, left circumflex and RCA  coronary artery calcifications noted. No significant pericardial effusion. Mediastinum/Nodes: Unchanged appearance of right posterior thyroid nodule measuring 1.6 cm. In the setting of significant comorbidities or limited life expectancy, no follow-up recommended (ref: J Am Coll Radiol. 2015 Feb;12(2): 143-50). The trachea appears patent and is midline. Normal appearance of the esophagus. Left supraclavicular lymph node is enlarged measuring 2.1 cm, image 12/2. Previously 2.0 cm. The low right paratracheal soft tissue measures 3.6 x 2.3 cm, image 26/2. Previously 1.9 x 1.8 cm. Lungs/Pleura: Loculated pleural fluid overlying the right lower lobe is again noted and appears mildly increased in volume in the interval. Similar appearance of focal area of loculated pleural fluid with overlying pleural thickening within the right upper chest. Today this measures 6.4 x 3.8 cm, image 25/2. Previously 6.1 x 3.9 cm. Extensive post treatment changes involving the right upper lobe with diffuse consolidation the and fibrosis. Multiple scattered lung nodules are identified bilaterally. Index nodule within the anterior left upper lobe measures 1.1 cm, image 74/6. Previously 0 point 8 mm. Index nodule within the lingula measures 1.3 x 0.8 cm, image 82/6. Previously 0.9 x 0.6 cm. Index nodule in the posterior left lower lung measures 1.5 x 1.3 cm, image 97/6. Previously 1.1 x 0.9 cm. Adjacent lung nodule measures 1.3 x 1.0 cm, image 98/6. Previously 1.0 x 0.8 cm. Lung nodule within the right lower lobe measures 0.9 x 0.6 cm, image 113/6. Previously 0.7 x 0.5 cm. Musculoskeletal: No aggressive lytic or sclerotic bone lesions. Superior endplate fracture involving the T12 vertebra is unchanged when compared with the previous exam. No aggressive lytic or sclerotic bone lesions identified. CT ABDOMEN PELVIS FINDINGS Hepatobiliary: No focal liver abnormality is seen. Numerous tiny stones are noted within the dependent portion of the  gallbladder. No gallbladder wall inflammation or bile duct dilatation. Pancreas: Unremarkable. No pancreatic ductal dilatation or surrounding inflammatory changes. Spleen: Normal in size without focal abnormality. Adrenals/Urinary Tract: Bilateral adrenal nodules are again noted. The right adrenal nodule measures 1.6 by 1.2 cm, image 58/2. Unchanged. 1.9 x 1.9 cm left adrenal nodule is also unchanged, image 59/2. New focal hypoenhancing abnormality within the posterolateral cortex of the upper pole of left kidney measures 1.4 x 1.9 cm. Similar appearance of small, less than 1 cm low-density foci within the right kidney. Urinary bladder unremarkable. Stomach/Bowel: Stomach is within normal limits. Appendix appears normal. No evidence of bowel wall thickening, distention, or inflammatory changes. Vascular/Lymphatic: Aortic atherosclerosis. Infrarenal abdominal aorta measures 2.8 cm, image 70/2. No upper abdominal adenopathy identified. Reproductive: Status post hysterectomy. No adnexal masses. Other: No free fluid or fluid collection. Musculoskeletal: Spondylosis identified within the lumbar spine. No acute or aggressive osseous abnormalities. IMPRESSION: 1. Interval progression of disease. 2. Increase in size of left supraclavicular lymph node and infiltrative soft tissue within the low right paratracheal nodal chain. 3. Multiple pulmonary nodules identified within the left lung and right lower lobe have increased in size when compared  with the previous exam. 4. Similar appearance of extensive post treatment changes involving the right mid and right upper lung. 5. New focal hypoenhancing abnormality within the posterolateral cortex of the upper pole of left kidney is identified. Although this is a nonspecific finding this may represent either focal pyelonephritis or metastatic disease. 6. Aortic atherosclerosis. Left main and 3 vessel coronary artery calcifications noted. 7. Gallstones. Aortic Atherosclerosis  (ICD10-I70.0). Electronically Signed   By: Kerby Moors M.D.   On: 09/08/2019 12:01    ASSESSMENT AND PLAN: This is a very pleasant 79 years old white female recently diagnosed with probably stage IV non-small cell lung cancer presented with large right suprahilar lung mass in addition to mediastinal lymphadenopathy and suspicious malignant right pleural effusion.  The patient underwent systemic chemotherapy with carboplatin, paclitaxel and Keytruda status post 3 cycles.  She was tolerating this treatment well with no concerning complaints except for the last cycle when she was admitted to the hospital with C. difficile as well as dehydration and renal insufficiency.   She started treatment with single agent Ketruda (pembrolizumab) status post 31 cycles.  This treatment was discontinued secondary to slow but persistent disease progression She was also treated with second line chemotherapy with single agent gemcitabine on days 1 and 8 initiated with a standard dose of 1000 mg/M2 for cycle 1 and then reduced to 800 mg/M2 on cycle 2 but the patient has a rough time with her treatment.  She also had evidence for disease progression after cycle #2. The patient has been in observation for the last few months. She is feeling fine except for the fatigue and shortness of breath. She had repeat CT scan of the chest, abdomen pelvis performed recently.  I personally and independently reviewed the scans and discussed the results with the patient and her daughter today. Her scan showed further disease progression in multiple areas including pulmonary nodules as well as lymphadenopathy. I discussed with the patient her treatment options again including palliative care and hospice referral versus consideration of treatment with nivolumab.  The patient is not interested in any further treatment and she would like to proceed with the palliative care on hospice at this point. I will see her on as-needed basis at this  point. For the hypothyroidism, we will continue to monitor her TSH with the future blood work. She was advised to call immediately if she has any concerning symptoms.  The patient voices understanding of current disease status and treatment options and is in agreement with the current care plan. All questions were answered. The patient knows to call the clinic with any problems, questions or concerns. We can certainly see the patient much sooner if necessary.  Disclaimer: This note was dictated with voice recognition software. Similar sounding words can inadvertently be transcribed and may not be corrected upon review.

## 2019-09-10 NOTE — Telephone Encounter (Signed)
Called in Hospice referral. Dr Julien Nordmann will be attending.

## 2019-09-18 DIAGNOSIS — H04123 Dry eye syndrome of bilateral lacrimal glands: Secondary | ICD-10-CM | POA: Diagnosis not present

## 2019-09-18 DIAGNOSIS — Z87891 Personal history of nicotine dependence: Secondary | ICD-10-CM | POA: Diagnosis not present

## 2019-09-18 DIAGNOSIS — F329 Major depressive disorder, single episode, unspecified: Secondary | ICD-10-CM | POA: Diagnosis not present

## 2019-09-18 DIAGNOSIS — I10 Essential (primary) hypertension: Secondary | ICD-10-CM | POA: Diagnosis not present

## 2019-09-18 DIAGNOSIS — C3491 Malignant neoplasm of unspecified part of right bronchus or lung: Secondary | ICD-10-CM | POA: Diagnosis not present

## 2019-09-18 DIAGNOSIS — Z86011 Personal history of benign neoplasm of the brain: Secondary | ICD-10-CM | POA: Diagnosis not present

## 2019-09-18 DIAGNOSIS — Z6824 Body mass index (BMI) 24.0-24.9, adult: Secondary | ICD-10-CM | POA: Diagnosis not present

## 2019-09-18 DIAGNOSIS — Z8679 Personal history of other diseases of the circulatory system: Secondary | ICD-10-CM | POA: Diagnosis not present

## 2019-09-18 DIAGNOSIS — C7802 Secondary malignant neoplasm of left lung: Secondary | ICD-10-CM | POA: Diagnosis not present

## 2019-09-18 DIAGNOSIS — E039 Hypothyroidism, unspecified: Secondary | ICD-10-CM | POA: Diagnosis not present

## 2019-09-19 DIAGNOSIS — Z86011 Personal history of benign neoplasm of the brain: Secondary | ICD-10-CM | POA: Diagnosis not present

## 2019-09-19 DIAGNOSIS — C7802 Secondary malignant neoplasm of left lung: Secondary | ICD-10-CM | POA: Diagnosis not present

## 2019-09-19 DIAGNOSIS — F329 Major depressive disorder, single episode, unspecified: Secondary | ICD-10-CM | POA: Diagnosis not present

## 2019-09-19 DIAGNOSIS — Z8679 Personal history of other diseases of the circulatory system: Secondary | ICD-10-CM | POA: Diagnosis not present

## 2019-09-19 DIAGNOSIS — C3491 Malignant neoplasm of unspecified part of right bronchus or lung: Secondary | ICD-10-CM | POA: Diagnosis not present

## 2019-09-19 DIAGNOSIS — I10 Essential (primary) hypertension: Secondary | ICD-10-CM | POA: Diagnosis not present

## 2019-09-22 ENCOUNTER — Telehealth: Payer: Self-pay | Admitting: Medical Oncology

## 2019-09-22 NOTE — Telephone Encounter (Signed)
Should Franchon continue Synthroid.  I told [name] that she should.

## 2019-09-26 DIAGNOSIS — I10 Essential (primary) hypertension: Secondary | ICD-10-CM | POA: Diagnosis not present

## 2019-09-26 DIAGNOSIS — Z86011 Personal history of benign neoplasm of the brain: Secondary | ICD-10-CM | POA: Diagnosis not present

## 2019-09-26 DIAGNOSIS — F329 Major depressive disorder, single episode, unspecified: Secondary | ICD-10-CM | POA: Diagnosis not present

## 2019-09-26 DIAGNOSIS — Z8679 Personal history of other diseases of the circulatory system: Secondary | ICD-10-CM | POA: Diagnosis not present

## 2019-09-26 DIAGNOSIS — C7802 Secondary malignant neoplasm of left lung: Secondary | ICD-10-CM | POA: Diagnosis not present

## 2019-09-26 DIAGNOSIS — C3491 Malignant neoplasm of unspecified part of right bronchus or lung: Secondary | ICD-10-CM | POA: Diagnosis not present

## 2019-10-07 DIAGNOSIS — C7802 Secondary malignant neoplasm of left lung: Secondary | ICD-10-CM | POA: Diagnosis not present

## 2019-10-07 DIAGNOSIS — C3491 Malignant neoplasm of unspecified part of right bronchus or lung: Secondary | ICD-10-CM | POA: Diagnosis not present

## 2019-10-07 DIAGNOSIS — Z8679 Personal history of other diseases of the circulatory system: Secondary | ICD-10-CM | POA: Diagnosis not present

## 2019-10-07 DIAGNOSIS — I10 Essential (primary) hypertension: Secondary | ICD-10-CM | POA: Diagnosis not present

## 2019-10-07 DIAGNOSIS — F329 Major depressive disorder, single episode, unspecified: Secondary | ICD-10-CM | POA: Diagnosis not present

## 2019-10-07 DIAGNOSIS — Z86011 Personal history of benign neoplasm of the brain: Secondary | ICD-10-CM | POA: Diagnosis not present

## 2019-12-09 DIAGNOSIS — E039 Hypothyroidism, unspecified: Secondary | ICD-10-CM | POA: Diagnosis not present

## 2019-12-09 DIAGNOSIS — I119 Hypertensive heart disease without heart failure: Secondary | ICD-10-CM | POA: Diagnosis not present

## 2019-12-09 DIAGNOSIS — C3491 Malignant neoplasm of unspecified part of right bronchus or lung: Secondary | ICD-10-CM | POA: Diagnosis not present

## 2019-12-29 DIAGNOSIS — D485 Neoplasm of uncertain behavior of skin: Secondary | ICD-10-CM | POA: Diagnosis not present

## 2019-12-29 DIAGNOSIS — D0472 Carcinoma in situ of skin of left lower limb, including hip: Secondary | ICD-10-CM | POA: Diagnosis not present

## 2020-02-15 DIAGNOSIS — I469 Cardiac arrest, cause unspecified: Secondary | ICD-10-CM | POA: Diagnosis not present

## 2020-02-15 DIAGNOSIS — R404 Transient alteration of awareness: Secondary | ICD-10-CM | POA: Diagnosis not present

## 2020-02-16 ENCOUNTER — Telehealth: Payer: Self-pay | Admitting: *Deleted

## 2020-02-16 NOTE — Telephone Encounter (Signed)
Daughter left a message stating that Cassidy Sanders died on 2022/06/04 at 11:15. Was very appreciative of all that Diane and Dr Julien Nordmann did for her.

## 2020-03-13 DIAGNOSIS — 419620001 Death: Secondary | SNOMED CT | POA: Diagnosis not present

## 2020-03-13 DEATH — deceased
